# Patient Record
Sex: Male | Born: 1935
Health system: Southern US, Community
[De-identification: ages and names within clinical notes are randomized; demographics above are authoritative.]

## PROBLEM LIST (undated history)

## (undated) DIAGNOSIS — I38 Endocarditis, valve unspecified: Secondary | ICD-10-CM

## (undated) DIAGNOSIS — I509 Heart failure, unspecified: Secondary | ICD-10-CM

## (undated) DIAGNOSIS — I4891 Unspecified atrial fibrillation: Secondary | ICD-10-CM

## (undated) DIAGNOSIS — I10 Essential (primary) hypertension: Secondary | ICD-10-CM

## (undated) DIAGNOSIS — S12600A Unspecified displaced fracture of seventh cervical vertebra, initial encounter for closed fracture: Secondary | ICD-10-CM

## (undated) DIAGNOSIS — I351 Nonrheumatic aortic (valve) insufficiency: Secondary | ICD-10-CM

## (undated) HISTORY — PX: NECK SURGERY: SHX720

## (undated) HISTORY — DX: Essential (primary) hypertension: I10

---

## 1958-04-29 HISTORY — PX: APPENDECTOMY: SHX54

## 2002-04-29 HISTORY — PX: SPINE SURGERY: SHX786

## 2002-06-02 ENCOUNTER — Inpatient Hospital Stay (HOSPITAL_COMMUNITY): Admission: AD | Admit: 2002-06-02 | Discharge: 2002-06-07 | Payer: Self-pay | Admitting: General Surgery

## 2002-06-02 ENCOUNTER — Encounter: Payer: Self-pay | Admitting: Emergency Medicine

## 2002-06-03 ENCOUNTER — Encounter: Payer: Self-pay | Admitting: Neurosurgery

## 2002-06-04 ENCOUNTER — Encounter: Payer: Self-pay | Admitting: Neurosurgery

## 2002-06-07 ENCOUNTER — Encounter: Payer: Self-pay | Admitting: Neurosurgery

## 2006-06-30 ENCOUNTER — Ambulatory Visit (HOSPITAL_COMMUNITY): Admission: RE | Admit: 2006-06-30 | Discharge: 2006-06-30 | Payer: Self-pay | Admitting: Ophthalmology

## 2006-07-31 ENCOUNTER — Ambulatory Visit (HOSPITAL_COMMUNITY): Admission: RE | Admit: 2006-07-31 | Discharge: 2006-07-31 | Payer: Self-pay | Admitting: Ophthalmology

## 2009-07-12 ENCOUNTER — Ambulatory Visit: Payer: Self-pay | Admitting: Family Medicine

## 2009-07-12 DIAGNOSIS — I10 Essential (primary) hypertension: Secondary | ICD-10-CM | POA: Insufficient documentation

## 2009-07-12 HISTORY — DX: Essential (primary) hypertension: I10

## 2009-07-14 LAB — CONVERTED CEMR LAB
BUN: 14 mg/dL (ref 6–23)
CO2: 31 meq/L (ref 19–32)
Calcium: 9.4 mg/dL (ref 8.4–10.5)
Chloride: 107 meq/L (ref 96–112)
Creatinine, Ser: 0.9 mg/dL (ref 0.4–1.5)
GFR calc non Af Amer: 86.11 mL/min (ref >60)
Glucose, Bld: 88 mg/dL (ref 70–99)
Potassium: 3.3 meq/L — ABNORMAL LOW (ref 3.5–5.1)
Sodium: 141 meq/L (ref 135–145)

## 2010-05-29 NOTE — Assessment & Plan Note (Signed)
Summary: TO EST//CCM   Vital Signs:  Patient profile:   77 year old male Height:      67.75 inches Weight:      190 pounds BMI:     29.21 Temp:     98.6 degrees F oral Pulse rate:   70 / minute Pulse rhythm:   regular Resp:     12 per minute BP sitting:   122 / 50  (left arm) Cuff size:   regular  Vitals Entered By: Sid Falcon LPN (July 12, 2009 8:44 AM)  Nutrition Counseling: Patient's BMI is greater than 25 and therefore counseled on weight management options. CC: New pt to establish, from [place]   History of Present Illness: New patient visit.  Patient has history of hypertension. He takes 2 medications and we need have not confirmed yet as he hasnot brought those with him today. History of appendectomy many years ago and cervical neck fracture requiring surgery 2004. No other chronic medical problems.  Family history significant for father having hypertension. One brother with colon cancer. Another with lung cancer.  Patient is married. Nonsmoker. No alcohol use. Stays quite active with gardening and other physical activities. No prior history of screening colonoscopy and at this point patient refuses. Old records pending.  Preventive Screening-Counseling & Management  Caffeine-Diet-Exercise     Does Patient Exercise: yes  Past History:  Family History: Last updated: 07/12/2009 Family History Hypertension- father Colon Ca  brother Lung Ca  brother  Social History: Last updated: 07/12/2009 Retired Married Alcohol use-no past smoker, quit 40 years ago Regular exercise-yes  Risk Factors: Exercise: yes (07/12/2009)  Past Medical History: Hypertension  Past Surgical History: Appendectomy  1960 Cervical fracture 2004  Family History: Family History Hypertension- father Colon Ca  brother Lung Ca  brother  Social History: Retired Married Alcohol use-no past smoker, quit 40 years ago Regular exercise-yes Does Patient Exercise:   yes  Review of Systems  The patient denies anorexia, fever, weight loss, weight gain, vision loss, chest pain, syncope, dyspnea on exertion, peripheral edema, prolonged cough, headaches, hemoptysis, abdominal pain, melena, hematochezia, and severe indigestion/heartburn.    Physical Exam  General:  Well-developed,well-nourished,in no acute distress; alert,appropriate and cooperative throughout examination Ears:  External ear exam shows no significant lesions or deformities.  Otoscopic examination reveals clear canals, tympanic membranes are intact bilaterally without bulging, retraction, inflammation or discharge. Hearing is grossly normal bilaterally. Mouth:  Oral mucosa and oropharynx without lesions or exudates.  Teeth in good repair. Neck:  No deformities, masses, or tenderness noted. Lungs:  patient has some crackles in the left base which may be chronic. No wheezes. No retractions. Heart:  normal rate, regular rhythm, and no gallop.   Extremities:  no pitting edema noted Neurologic:  alert & oriented X3, cranial nerves II-XII intact, and strength normal in all extremities.   Psych:  normally interactive, good eye contact, not anxious appearing, and not depressed appearing.     Impression & Recommendations:  Problem # 1:  HYPERTENSION (ICD-401.9)  His updated medication list for this problem includes:    Hydrochlorothiazide 12.5 Mg Caps (Hydrochlorothiazide) ..... One by mouth once daily    Amlodipine Besylate 10 Mg Tabs (Amlodipine besylate) ..... One by mouth once daily  Orders: TLB-BMP (Basic Metabolic Panel-BMET) (80048-METABOL) Venipuncture ([address])  Complete Medication List: 1)  Hydrochlorothiazide 12.5 Mg Caps (Hydrochlorothiazide) .... One by mouth once daily 2)  Amlodipine Besylate 10 Mg Tabs (Amlodipine besylate) .... One by mouth once daily  Patient  Instructions: 1)  Please schedule a follow-up appointment in 6 months .  2)  It is important that you exercise  reguarly at least 20 minutes 5 times a week. If you develop chest pain, have severe difficulty breathing, or feel very tired, stop exercising immediately and seek medical attention.  Prescriptions: AMLODIPINE BESYLATE 10 MG TABS (AMLODIPINE BESYLATE) one by mouth once daily  #90 x 3   Entered and Authorized by:   Evelena Peat MD   Signed by:   Evelena Peat MD on 07/12/2009   Method used:   Electronically to        [facility] (retail)       [address]       [place], [place]  [address]       Ph: [phone]       Fax: [phone]   RxID:   [id number] HYDROCHLOROTHIAZIDE 12.5 MG CAPS (HYDROCHLOROTHIAZIDE) one by mouth once daily  #90 x 3   Entered and Authorized by:   Evelena Peat MD   Signed by:   Evelena Peat MD on 07/12/2009   Method used:   Electronically to        [facility] (retail)       [address]       [place], [place]  [address]       Ph: [phone]       Fax: [phone]   RxID:   [id number]

## 2010-07-23 ENCOUNTER — Other Ambulatory Visit: Payer: Self-pay | Admitting: Family Medicine

## 2010-09-11 ENCOUNTER — Encounter: Payer: Self-pay | Admitting: Family Medicine

## 2010-09-12 ENCOUNTER — Encounter: Payer: Self-pay | Admitting: Family Medicine

## 2010-09-12 ENCOUNTER — Ambulatory Visit (INDEPENDENT_AMBULATORY_CARE_PROVIDER_SITE_OTHER): Payer: Medicare Other | Admitting: Family Medicine

## 2010-09-12 VITALS — BP 148/60 | Temp 98.3°F | Ht 68.5 in | Wt 197.0 lb

## 2010-09-12 DIAGNOSIS — I10 Essential (primary) hypertension: Secondary | ICD-10-CM

## 2010-09-12 LAB — BASIC METABOLIC PANEL WITH GFR
BUN: 14 mg/dL (ref 6–23)
CO2: 27 meq/L (ref 19–32)
Calcium: 9.4 mg/dL (ref 8.4–10.5)
Chloride: 106 meq/L (ref 96–112)
Creatinine, Ser: 0.9 mg/dL (ref 0.4–1.5)
GFR: 90.49 mL/min
Glucose, Bld: 77 mg/dL (ref 70–99)
Potassium: 3.1 meq/L — ABNORMAL LOW (ref 3.5–5.1)
Sodium: 141 meq/L (ref 135–145)

## 2010-09-12 MED ORDER — HYDROCHLOROTHIAZIDE 12.5 MG PO CAPS: 12.5000 mg | ORAL_CAPSULE | ORAL | Status: DC

## 2010-09-12 MED ORDER — AMLODIPINE BESYLATE 10 MG PO TABS: 10.0000 mg | ORAL_TABLET | Freq: Every day | ORAL | Status: DC

## 2010-09-12 NOTE — Progress Notes (Signed)
  Subjective:    Patient ID: Brett Blankenship, male    DOB: Oct 08, 1926, 77 y.o.   MRN: [id number]  HPI Followup hypertension. Patient takes Norvasc 10 mg daily and hydrochlorothiazide 12.5 mg daily. No dizziness. Patient denies any chest pains or shortness of breath. Trace edema legs not changed. Stays quite active with gardening. No complaints. Nonsmoker. Compliant with medications  Past Medical History  Diagnosis Date  . HYPERTENSION 07/12/2009   Past Surgical History  Procedure Date  . Appendectomy 1960  . Spine surgery 2004    fracture cervical spine    reports that he quit smoking about 47 years ago. His smoking use included Cigarettes. He has a 1.5 pack-year smoking history. He does not have any smokeless tobacco history on file. His alcohol and drug histories not on file. family history includes Cancer in his brother and Hypertension in his father. No Known Allergies    Review of Systems  Constitutional: Negative for fatigue.  Eyes: Negative for visual disturbance.  Respiratory: Negative for cough, chest tightness and shortness of breath.   Cardiovascular: Negative for chest pain, palpitations and leg swelling.  Neurological: Negative for dizziness, syncope, weakness, light-headedness and headaches.       Objective:   Physical Exam  Constitutional: He appears well-developed and well-nourished.  HENT:  Right Ear: External ear normal.  Left Ear: External ear normal.  Mouth/Throat: Oropharynx is clear and moist. No oropharyngeal exudate.  Neck: No thyromegaly present.  Cardiovascular: Normal rate, regular rhythm and normal heart sounds.   Pulmonary/Chest: Breath sounds normal. No respiratory distress. He has no wheezes. He has no rales.  Musculoskeletal: He exhibits no edema.  Lymphadenopathy:    He has no cervical adenopathy.  Neurological: He is alert.          Assessment & Plan:  Hypertension stable. Refilled medications for one year. Check basic metabolic  panel (on HCTZ). Office followup 6 months

## 2010-09-13 NOTE — Progress Notes (Signed)
Quick Note:  Sent labs and instructions highlighted along with list of high potassium rich foods to pt home. ______

## 2010-09-13 NOTE — Progress Notes (Signed)
Quick Note:  Pt wife informed also ______

## 2010-09-14 NOTE — Discharge Summary (Signed)
NAME:  Brett Blankenship, Brett Blankenship NO.:  [id number]   MEDICAL RECORD NO.:  [id number]                   PATIENT TYPE:  INP   LOCATION:  3104                                 FACILITY:  [facility]   PHYSICIAN:  Jimmye Norman, M.D.                   DATE OF BIRTH:  09/23/1926   DATE OF ADMISSION:  06/02/2002  DATE OF DISCHARGE:  06/07/2002                                 DISCHARGE SUMMARY   ADMITTING TRAUMA SURGEON:  Jimmye Norman, M.D.   CONSULTATIONS:  Coletta Memos, M.D.,  Neurosurgery.   DISCHARGE DIAGNOSES:  1. Status post fall.  2. Type 2 odontoid fracture.  3. Right medial thigh contusion.  4. Right periorbital contusion.   PROCEDURE:  Transodontoid screw placement per Dr. Franky Macho June 04, 2002.   HISTORY OF PRESENT ILLNESS:  This is a 77 year old gentleman who was cutting  down some tree limbs when he fell off a downed tree which knocked him over  into a pile of branches.  The limb actually hit him in the left lower  extremity, flipping him over off his feet and onto the ground.  He  immediately had neck pain but no loss of consciousness.  He was taken to  [facility] for assessment and a CT scan at that time showed an  odontoid fracture at the base of the dens.  The patient was transferred to  [facility] for definitive care.   HOSPITAL COURSE:  On evaluation at [facility] again he was  noted to have odontoid fracture at the base of the dens on CT scan. He was  hemodynamically stable.  He did have some bruising about his scalp and right  periorbital area.   The patient was seen in consultation by Dr. Franky Macho and he continued to be  maintained in a cervical collar.  He remained neurologically intact  throughout his hospitalization.  The patient did have follow up lateral  cervical spine films in the seated position which showed some instability of  his type 2 odontoid fracture.  An MRI scan showed that the  transverse  ligament was intact.  Secondary to the instability of the patient's fracture  it was recommended that he undergo transodontoid screw placements and the  patient agreed to this procedure and underwent this procedure on June 04, 2002 per Dr. Franky Macho.  Postoperatively he has had a benign course.  He has  been mobilized without difficulty and continues to wear a [place] J collar.   The patient had some mild hypokalemia which was corrected.  It was felt the  patient would be ready for discharge either today or tomorrow.    DISCHARGE MEDICATIONS:  1. Vicodin one to two p.o. q.4-6h. PRN pain, #50, no refills.  2. Flexeril 10 mg one p.o. t.i.d. PRN muscle spasms, #40.   FOLLOW UP:  Patient will follow up with Dr.  Cabbell in several weeks and  with trauma service as needed.     Shawn Rayburn, P.A.                       Jimmye Norman, M.D.    SR/MEDQ  D:  06/07/2002  T:  06/07/2002  Job:  [id number]   cc:   Coletta Memos, M.D.  [address]  [place]  [place] [address]  Fax: [phone]

## 2010-09-14 NOTE — Consult Note (Signed)
NAME:  AMAURIS, DEBOIS                       ACCOUNT NO.:  [id number]   MEDICAL RECORD NO.:  [id number]                   PATIENT TYPE:  INP   LOCATION:  3104                                 FACILITY:  [facility]   PHYSICIAN:  Coletta Memos, M.D.                  DATE OF BIRTH:  07-Apr-1927   DATE OF CONSULTATION:  06/03/2002  DATE OF DISCHARGE:                                   CONSULTATION   REASON FOR CONSULTATION:  Type II C2 fracture.   INDICATIONS:  Bentley Fissel is a 77 year old who fell off of a downed tree  onto the ground after a branch knocked him over on June 02, 2002.  He was  taken to [facility] where x-rays were performed showing a type II  fracture.   Mr. Vandermeulen is otherwise healthy and while cutting a limb on a downed tree,  it caused him to fly into the air and landed on the right side of his face.  He did not lose consciousness but when he complained of pain he was then  taken to the hospital.   PAST MEDICAL HISTORY:  Excellent.   PAST SURGICAL HISTORY:  1. Inguinal herniorrhaphy.  2. Appendectomy.   SOCIAL HISTORY:  He does not smoke nor does he drink.  He is currently  retired.   ALLERGIES:  No known drug allergies.   CURRENT MEDICATIONS:  He currently takes no medications.   PHYSICAL EXAMINATION:  GENERAL:  He is alert and oriented x4 and answering  all questions appropriately.  Memory __________, attention span and  __________ knowledge are normal.  He is well kempt and in no acute distress.  NEUROLOGIC:  He has 5/5 strength in the upper and lower extremities.  Speech  is slurred but easily understandable.  He is in a cervical collar with dried  blood about his right face and multiple abrasions and areas of bruising on  the right side.  LUNGS:  Fields are clear.  HEART:  Regular rhythm and rate.  No murmurs or rubs.  EXTREMITIES:  Pulses good at the wrists and feet bilaterally.   IMPRESSION:  Mr. Utsey is admitted by the Trauma  Service.  I will order  upright and supine lateral and AP cervical spine films to assess stability  in the cervical collar.  I will check those films later today and make a  final  judgment on whether or not I think any surgery is indicated.  He is  otherwise doing quite well and has no neurosurgical problems.  I was not  able to find the CT exam which had been performed at the [facility]  but by exam clearly does not have a neurologic deficit there with regards to  his motor strength.  Coletta Memos, M.D.    KC/MEDQ  D:  06/04/2002  T:  06/05/2002  Job:  [id number]

## 2010-09-14 NOTE — Op Note (Signed)
NAME:  Brett Blankenship, Brett Blankenship                       ACCOUNT NO.:  [id number]   MEDICAL RECORD NO.:  [id number]                   PATIENT TYPE:  INP   LOCATION:  3104                                 FACILITY:  [facility]   PHYSICIAN:  Coletta Memos, M.D.                  DATE OF BIRTH:  July 29, 1926   DATE OF PROCEDURE:  06/04/2002  DATE OF DISCHARGE:                                 OPERATIVE REPORT   PREOPERATIVE DIAGNOSES:  Type 2 C2 fracture.   POSTOPERATIVE DIAGNOSES:  Type 2 C2 fracture.   PROCEDURE:  Transodontoid screw placement with fluoroscopic guidance.   COMPLICATIONS:  None.   ANESTHESIA:  General endotracheal.   SURGEON:  Coletta Memos, M.D.   ASSISTANT:  Cristi Loron, M.D.   INDICATIONS:  The patient is a 77 year old who suffered a fall on June 02, 2002.  This resulted in a type 2 odontoid fracture.  Neurologically he  has had no problems.  MRI done preoperatively showed that the transverse  ligament was intact.  I recommended, and he has agreed, to undergo a  transodontoid screw placement for stabilization of the fracture.   OPERATIVE NOTE:  The patient was brought to the operating room, intubated  and placed under general anesthesia.  A Foley catheter was placed under  sterile conditions.  His neck was positioned so that the fracture was  reduced.  This was seen in both AP and lateral fluoroscopy, which was used  simultaneously.  His neck was then prepped and he was draped in a sterile  fashion.  I infiltrated 3 cc of 0.5% lidocaine, 1:200,000 strength  epinephrine.  I then made an incision starting at the midline and extended  to the medial border of the right sternocleidomastoid.  I then removed soft  tissue and exposed the anterior cervical spine.  Then, using fluoroscopy, I  saw that I was at the C2-3 disk space.  I placed the Medtronic screw guide  there, and then using an awl made a hole into C2.  I then placed an inner  sleeve with Dr. Lovell Sheehan assistance.   We placed a threaded wire into C2 and  our proposed trajectory using fluoroscopic guidance.  A drill was then used  to drill over the wire into C2.  The bone was then packed.  A 42 mm lag  screw was then placed without difficulty.  Fluoroscopy was used during all  phases to ensure proper placement.  The AP and lateral views looked good.  I  then irrigated the wound.  I then closed the wound in layered fashion, using  Vicryl suture reapproximating the platysma subcutaneous tissues.  Dermabond  used for a sterile dressing.  The patient was placed back into a cervical  collar, moving all extremities postoperatively.  Coletta Memos, M.D.    KC/MEDQ  D:  06/04/2002  T:  06/05/2002  Job:  [id number]

## 2010-09-14 NOTE — H&P (Signed)
NAME:  Brett Blankenship, Brett Blankenship                       ACCOUNT NO.:  [id number]   MEDICAL RECORD NO.:  [id number]                   PATIENT TYPE:  INP   LOCATION:  3107                                 FACILITY:  [facility]   PHYSICIAN:  Jimmye Norman III, M.D.               DATE OF BIRTH:  Sep 07, 1926   DATE OF ADMISSION:  06/02/2002  DATE OF DISCHARGE:                                HISTORY & PHYSICAL   IDENTIFICATION AND CHIEF COMPLAINT:  The patient is a 77 year old who fell  off of a downed tree onto the ground after a branch knocked him over,  resulting in a type 2 C2 fracture.   HISTORY OF PRESENT ILLNESS:  The patient is otherwise healthy, was cutting  on a limb on a downed tree when it broke off, causing it to flip the patient  off of his feet, onto the ground, into some bushes, onto his right shoulder  and neck.  He immediately had neck pain but had no LOC, continued to  complain of neck pain until when he was admitted to [name],  where CT showed an odontoid fracture at the base of the dens.  He was  transferred to [facility] for definitive therapy and care.  He got a tetanus  shot at [facility].   PAST MEDICAL HISTORY:  His past history is unremarkable.   PAST SURGICAL HISTORY:  His past surgical history is significant for an  appendectomy at age 60 and a right inguinal hernia when he was 77 years old.   SOCIAL HISTORY:  He is a nonsmoker, does not drink.  Occupation:  He is  retired from [employer] account [employer].   MEDICATIONS:  He takes no medications.   ALLERGIES:  He has no known drug allergies.   REVIEW OF SYSTEMS:  Besides the neck pain which he rates to be 4-8/10, he is  otherwise complaining of some bruising around his eye.   PHYSICAL EXAMINATION:  VITAL SIGNS:  Pulse is 70, blood pressure 158/67,  respirations 14, temperature 99.2.  GENERAL:  He has good color and warm.  HEENT:  He is normocephalic.  He does have facial trauma around the right  eye  with right mild periorbital ecchymoses and edema.  His TMs are okay.  He  has no external auricle trauma, no oral trauma.  NECK:  His neck is tender and he has pain.  There is no swelling or  crepitance.  His trachea is midline.  CHEST:  No chest wall crepitance.  No tenderness.  No wounds.  He has got  clear breath sounds.  CARDIAC:  Regular rate and rhythm with no murmurs, gallops, lifts or heaves.  ABDOMEN:  His abdomen is soft and nontender.  BACK:  He has no back stepoffs and no deformities.  NEUROLOGIC/EXTREMITIES:  Cranial nerve II-XII are grossly intact.  He is  oriented x3 and pulses and sensation are  normal.  He does have only 1+  posterior tibial pulses bilaterally and no palpable dorsalis pedis with sort  of a fifth digit greater than fourth digit abnormality of the foot; this is  bilateral and congenital.  MUSCULOSKELETAL:  He has got a type 2 fracture of the odontoid process.   PLAN:  Plan is to keep him in a [place], currently in a [place] J, which  appears to be fitting him well.  Dr. Coletta Memos, the neurosurgeon, will  see him in the morning.  Unlikely that the patient will need surgery but we  will control his pain currently, allow him a regular diet.                                               Kathrin Ruddy, M.D.    JW/MEDQ  D:  06/02/2002  T:  06/03/2002  Job:  [id number]   cc:   Coletta Memos, M.D.  [address]  [place]  [place] [address]  Fax: [phone]

## 2010-10-20 ENCOUNTER — Other Ambulatory Visit: Payer: Self-pay | Admitting: Family Medicine

## 2010-10-22 ENCOUNTER — Other Ambulatory Visit: Payer: Self-pay | Admitting: Family Medicine

## 2010-10-22 MED ORDER — AMLODIPINE BESYLATE 10 MG PO TABS: 10.0000 mg | ORAL_TABLET | Freq: Every day | ORAL | Status: DC

## 2010-10-22 MED ORDER — HYDROCHLOROTHIAZIDE 12.5 MG PO CAPS: 12.5000 mg | ORAL_CAPSULE | ORAL | Status: DC

## 2010-10-22 NOTE — Telephone Encounter (Signed)
Pt informed Rx filled

## 2010-10-22 NOTE — Telephone Encounter (Signed)
Refill HCTZ 12.5mg  #90 and Amlodiopine 10mg  #90 to [facility]. He is almost outt.

## 2010-11-20 ENCOUNTER — Encounter (INDEPENDENT_AMBULATORY_CARE_PROVIDER_SITE_OTHER): Payer: Medicare Other | Admitting: Ophthalmology

## 2010-11-20 DIAGNOSIS — H353290 Exudative age-related macular degeneration, unspecified eye, stage unspecified: Secondary | ICD-10-CM

## 2010-11-20 DIAGNOSIS — H43819 Vitreous degeneration, unspecified eye: Secondary | ICD-10-CM

## 2010-11-20 DIAGNOSIS — H3530 Unspecified macular degeneration: Secondary | ICD-10-CM

## 2011-02-20 ENCOUNTER — Encounter (INDEPENDENT_AMBULATORY_CARE_PROVIDER_SITE_OTHER): Payer: Medicare Other | Admitting: Ophthalmology

## 2011-02-20 DIAGNOSIS — H3530 Unspecified macular degeneration: Secondary | ICD-10-CM

## 2011-02-20 DIAGNOSIS — H33309 Unspecified retinal break, unspecified eye: Secondary | ICD-10-CM

## 2011-02-20 DIAGNOSIS — H35359 Cystoid macular degeneration, unspecified eye: Secondary | ICD-10-CM

## 2011-02-20 DIAGNOSIS — H35379 Puckering of macula, unspecified eye: Secondary | ICD-10-CM

## 2011-03-14 ENCOUNTER — Encounter: Payer: Self-pay | Admitting: Family Medicine

## 2011-03-18 ENCOUNTER — Ambulatory Visit: Payer: Medicare Other | Admitting: Family Medicine

## 2011-03-20 ENCOUNTER — Encounter: Payer: Self-pay | Admitting: Family Medicine

## 2011-03-20 ENCOUNTER — Ambulatory Visit (INDEPENDENT_AMBULATORY_CARE_PROVIDER_SITE_OTHER): Payer: Medicare Other | Admitting: Family Medicine

## 2011-03-20 VITALS — BP 130/64 | HR 60 | Temp 98.3°F | Resp 12 | Ht 68.5 in | Wt 197.0 lb

## 2011-03-20 DIAGNOSIS — I10 Essential (primary) hypertension: Secondary | ICD-10-CM

## 2011-03-20 DIAGNOSIS — E876 Hypokalemia: Secondary | ICD-10-CM

## 2011-03-20 LAB — BASIC METABOLIC PANEL
BUN: 18 mg/dL (ref 6–23)
GFR: 91.6 mL/min (ref >60.00)
Potassium: 3.3 mEq/L — ABNORMAL LOW (ref 3.5–5.1)
Sodium: 140 mEq/L (ref 135–145)

## 2011-03-20 NOTE — Progress Notes (Signed)
  Subjective:    Patient ID: Brett Blankenship, male    DOB: 10-09-1926, 77 y.o.   MRN: [id number]  HPI  Followup hypertension. Patient takes amlodipine and HCTZ. Not monitoring blood pressure. No headaches, dizziness, chest pains, or dyspnea. No peripheral edema issues. Patient has history of hypokalemia. Last potassium 3.1. Does eat lots of fruits and vegetables.   Review of Systems  Constitutional: Negative for fatigue.  Eyes: Negative for visual disturbance.  Respiratory: Negative for cough, chest tightness and shortness of breath.   Cardiovascular: Negative for chest pain, palpitations and leg swelling.  Neurological: Negative for dizziness, syncope, weakness, light-headedness and headaches.       Objective:   Physical Exam  Constitutional: He appears well-developed and well-nourished.  Neck: Neck supple. No thyromegaly present.  Cardiovascular: Normal rate, regular rhythm and normal heart sounds.   Pulmonary/Chest: Effort normal and breath sounds normal. No respiratory distress. He has no wheezes. He has no rales.  Musculoskeletal: He exhibits no edema.  Lymphadenopathy:    He has no cervical adenopathy.          Assessment & Plan:  #1 hypertension. Stable. Continue current medications. Check basic metabolic panel with history of mild hypokalemia

## 2011-03-20 NOTE — Patient Instructions (Signed)
Foods Rich in Potassium Food / Potassium (mg)  Apricots, dried,  cup / 378 mg   Apricots, raw, 1 cup halves / 401 mg   Avocado,  / 487 mg   Banana, 1 large / 487 mg   Beef, lean, round, 3 oz / 202 mg   Cantaloupe, 1 cup cubes / 427 mg   Dates, medjool, 5 whole / 835 mg   Ham, cured, 3 oz / 212 mg   Lentils, dried,  cup / 458 mg   Lima beans, frozen,  cup / 258 mg   Orange, 1 large / 333 mg   Orange juice, 1 cup / 443 mg   Peaches, dried,  cup / 398 mg   Peas, split, cooked,  cup / 355 mg   Potato, boiled, 1 medium / 515 mg   Prunes, dried, uncooked,  cup / 318 mg   Raisins,  cup / 309 mg   Salmon, pink, raw, 3 oz / 275 mg   Sardines, canned , 3 oz / 338 mg   Tomato, raw, 1 medium / 292 mg   Tomato juice, 6 oz / 417 mg   Turkey, 3 oz / 349 mg  Document Released: 04/15/2005 Document Revised: 12/26/2010 Document Reviewed: 08/29/2008 [employer] Patient Information 2012 [employer]. 

## 2011-03-22 ENCOUNTER — Telehealth: Payer: Self-pay | Admitting: Family Medicine

## 2011-03-22 MED ORDER — POTASSIUM CHLORIDE CRYS ER 20 MEQ PO TBCR: 20.0000 meq | EXTENDED_RELEASE_TABLET | Freq: Every day | ORAL | Status: DC

## 2011-03-22 NOTE — Progress Notes (Signed)
Quick Note:  Left a message for pt to return call. ______ 

## 2011-03-22 NOTE — Progress Notes (Signed)
Quick Note:  Pt aware; medication has been sent to pharmacy. ______

## 2011-03-22 NOTE — Progress Notes (Signed)
Addended by: Azucena Freed on: 03/22/2011 12:21 PM   Modules accepted: Orders

## 2011-03-22 NOTE — Telephone Encounter (Signed)
Pt aware of lab results 

## 2011-03-22 NOTE — Progress Notes (Signed)
Addended by: Azucena Freed on: 03/22/2011 03:02 PM   Modules accepted: Orders

## 2011-03-22 NOTE — Telephone Encounter (Signed)
Pt called and said that he is returning call back from nurse and is req a call back asap today.

## 2011-03-23 ENCOUNTER — Other Ambulatory Visit: Payer: Self-pay | Admitting: Family Medicine

## 2011-06-21 ENCOUNTER — Other Ambulatory Visit: Payer: Medicare Other

## 2011-06-21 ENCOUNTER — Other Ambulatory Visit (INDEPENDENT_AMBULATORY_CARE_PROVIDER_SITE_OTHER): Payer: Medicare Other

## 2011-06-21 DIAGNOSIS — E876 Hypokalemia: Secondary | ICD-10-CM

## 2011-06-21 LAB — BASIC METABOLIC PANEL
CO2: 28 mEq/L (ref 19–32)
Calcium: 9.2 mg/dL (ref 8.4–10.5)
Chloride: 103 mEq/L (ref 96–112)
Sodium: 137 mEq/L (ref 135–145)

## 2011-06-25 ENCOUNTER — Other Ambulatory Visit: Payer: Self-pay | Admitting: Family Medicine

## 2011-06-25 NOTE — Progress Notes (Signed)
Quick Note:  Pt informed. Copy of labs and potassium rich foods mailed to home, he will purchase K supplement today again as he ran out ______

## 2011-07-22 ENCOUNTER — Other Ambulatory Visit: Payer: Self-pay | Admitting: Family Medicine

## 2011-07-23 ENCOUNTER — Other Ambulatory Visit: Payer: Self-pay | Admitting: Family Medicine

## 2011-09-18 ENCOUNTER — Ambulatory Visit: Payer: Medicare Other | Admitting: Family Medicine

## 2011-09-18 ENCOUNTER — Ambulatory Visit (INDEPENDENT_AMBULATORY_CARE_PROVIDER_SITE_OTHER): Payer: Medicare Other | Admitting: Family Medicine

## 2011-09-18 ENCOUNTER — Encounter: Payer: Self-pay | Admitting: Family Medicine

## 2011-09-18 VITALS — BP 124/68 | Temp 98.0°F | Wt 204.0 lb

## 2011-09-18 DIAGNOSIS — I10 Essential (primary) hypertension: Secondary | ICD-10-CM

## 2011-09-18 NOTE — Patient Instructions (Signed)
Foods Rich in Potassium Food / Potassium (mg)  Apricots, dried,  cup / 378 mg   Apricots, raw, 1 cup halves / 401 mg   Avocado,  / 487 mg   Banana, 1 large / 487 mg   Beef, lean, round, 3 oz / 202 mg   Cantaloupe, 1 cup cubes / 427 mg   Dates, medjool, 5 whole / 835 mg   Ham, cured, 3 oz / 212 mg   Lentils, dried,  cup / 458 mg   Lima beans, frozen,  cup / 258 mg   Orange, 1 large / 333 mg   Orange juice, 1 cup / 443 mg   Peaches, dried,  cup / 398 mg   Peas, split, cooked,  cup / 355 mg   Potato, boiled, 1 medium / 515 mg   Prunes, dried, uncooked,  cup / 318 mg   Raisins,  cup / 309 mg   Salmon, pink, raw, 3 oz / 275 mg   Sardines, canned , 3 oz / 338 mg   Tomato, raw, 1 medium / 292 mg   Tomato juice, 6 oz / 417 mg   Turkey, 3 oz / 349 mg  Document Released: 04/15/2005 Document Revised: 12/26/2010 Document Reviewed: 08/29/2008 [employer] Patient Information 2012 [employer]. 

## 2011-09-18 NOTE — Progress Notes (Signed)
  Subjective:    Patient ID: Brett Blankenship, male    DOB: Sep 02, 1926, 78 y.o.   MRN: [id number]  HPI  Followup hypertension. Patient takes amlodipine and HCTZ. History of mild hypokalemia. We started potassium replacement several months ago. He wishes to get off this. Does eat fairly high potassium diet and handout given today. Denies any chest pains. No dizziness. No orthostasis. Compliant with medications. Denies any side effects. Stays active with gardening.  Past Medical History  Diagnosis Date  . HYPERTENSION 07/12/2009   Past Surgical History  Procedure Date  . Appendectomy 1960  . Spine surgery 2004    fracture cervical spine    reports that he quit smoking about 48 years ago. His smoking use included Cigarettes. He has a 1.5 pack-year smoking history. He does not have any smokeless tobacco history on file. His alcohol and drug histories not on file. family history includes Cancer in his brother and Hypertension in his father. No Known Allergies    Review of Systems  Constitutional: Negative for fatigue.  Eyes: Negative for visual disturbance.  Respiratory: Negative for cough, chest tightness and shortness of breath.   Cardiovascular: Negative for chest pain, palpitations and leg swelling.  Neurological: Negative for dizziness, syncope, weakness, light-headedness and headaches.       Objective:   Physical Exam  Constitutional: He is oriented to person, place, and time. He appears well-developed and well-nourished.  Cardiovascular: Normal rate and regular rhythm.   Pulmonary/Chest: Effort normal and breath sounds normal. No respiratory distress. He has no wheezes. He has no rales.  Musculoskeletal:       Only very minimal trace edema lower legs  Neurological: He is alert and oriented to person, place, and time.          Assessment & Plan:  Hypertension. Stable and at goal. Continue current medications. High potassium diet. Recheck basic metabolic panel at  followup in 6 months.

## 2011-10-24 ENCOUNTER — Other Ambulatory Visit: Payer: Self-pay | Admitting: Family Medicine

## 2011-11-25 ENCOUNTER — Encounter (INDEPENDENT_AMBULATORY_CARE_PROVIDER_SITE_OTHER): Payer: Medicare Other | Admitting: Ophthalmology

## 2011-11-25 DIAGNOSIS — H35359 Cystoid macular degeneration, unspecified eye: Secondary | ICD-10-CM

## 2011-11-25 DIAGNOSIS — H33309 Unspecified retinal break, unspecified eye: Secondary | ICD-10-CM

## 2011-11-25 DIAGNOSIS — H43819 Vitreous degeneration, unspecified eye: Secondary | ICD-10-CM

## 2011-11-25 DIAGNOSIS — H35039 Hypertensive retinopathy, unspecified eye: Secondary | ICD-10-CM

## 2011-11-25 DIAGNOSIS — I10 Essential (primary) hypertension: Secondary | ICD-10-CM

## 2011-11-25 DIAGNOSIS — H3530 Unspecified macular degeneration: Secondary | ICD-10-CM

## 2011-12-23 ENCOUNTER — Encounter (INDEPENDENT_AMBULATORY_CARE_PROVIDER_SITE_OTHER): Payer: Medicare Other | Admitting: Ophthalmology

## 2011-12-23 DIAGNOSIS — H33309 Unspecified retinal break, unspecified eye: Secondary | ICD-10-CM

## 2011-12-23 DIAGNOSIS — H43819 Vitreous degeneration, unspecified eye: Secondary | ICD-10-CM

## 2011-12-23 DIAGNOSIS — H35359 Cystoid macular degeneration, unspecified eye: Secondary | ICD-10-CM

## 2011-12-23 DIAGNOSIS — I10 Essential (primary) hypertension: Secondary | ICD-10-CM

## 2011-12-23 DIAGNOSIS — H3530 Unspecified macular degeneration: Secondary | ICD-10-CM

## 2011-12-23 DIAGNOSIS — H35039 Hypertensive retinopathy, unspecified eye: Secondary | ICD-10-CM

## 2012-01-20 ENCOUNTER — Encounter (INDEPENDENT_AMBULATORY_CARE_PROVIDER_SITE_OTHER): Payer: Medicare Other | Admitting: Ophthalmology

## 2012-01-20 DIAGNOSIS — H35039 Hypertensive retinopathy, unspecified eye: Secondary | ICD-10-CM

## 2012-01-20 DIAGNOSIS — H43819 Vitreous degeneration, unspecified eye: Secondary | ICD-10-CM

## 2012-01-20 DIAGNOSIS — H33309 Unspecified retinal break, unspecified eye: Secondary | ICD-10-CM

## 2012-01-20 DIAGNOSIS — H3530 Unspecified macular degeneration: Secondary | ICD-10-CM

## 2012-01-20 DIAGNOSIS — I10 Essential (primary) hypertension: Secondary | ICD-10-CM

## 2012-01-20 DIAGNOSIS — H35359 Cystoid macular degeneration, unspecified eye: Secondary | ICD-10-CM

## 2012-02-13 ENCOUNTER — Encounter (INDEPENDENT_AMBULATORY_CARE_PROVIDER_SITE_OTHER): Payer: Medicare Other | Admitting: Ophthalmology

## 2012-02-13 DIAGNOSIS — H43819 Vitreous degeneration, unspecified eye: Secondary | ICD-10-CM

## 2012-02-13 DIAGNOSIS — I10 Essential (primary) hypertension: Secondary | ICD-10-CM

## 2012-02-13 DIAGNOSIS — H35039 Hypertensive retinopathy, unspecified eye: Secondary | ICD-10-CM

## 2012-02-13 DIAGNOSIS — H35359 Cystoid macular degeneration, unspecified eye: Secondary | ICD-10-CM

## 2012-02-13 DIAGNOSIS — H33309 Unspecified retinal break, unspecified eye: Secondary | ICD-10-CM

## 2012-03-12 ENCOUNTER — Encounter (INDEPENDENT_AMBULATORY_CARE_PROVIDER_SITE_OTHER): Payer: Medicare Other | Admitting: Ophthalmology

## 2012-03-12 DIAGNOSIS — H43819 Vitreous degeneration, unspecified eye: Secondary | ICD-10-CM

## 2012-03-12 DIAGNOSIS — H33309 Unspecified retinal break, unspecified eye: Secondary | ICD-10-CM

## 2012-03-12 DIAGNOSIS — H35359 Cystoid macular degeneration, unspecified eye: Secondary | ICD-10-CM

## 2012-03-12 DIAGNOSIS — H35039 Hypertensive retinopathy, unspecified eye: Secondary | ICD-10-CM

## 2012-03-12 DIAGNOSIS — I10 Essential (primary) hypertension: Secondary | ICD-10-CM

## 2012-03-20 ENCOUNTER — Ambulatory Visit: Payer: Medicare Other | Admitting: Family Medicine

## 2012-03-25 ENCOUNTER — Encounter: Payer: Self-pay | Admitting: Family Medicine

## 2012-03-25 ENCOUNTER — Ambulatory Visit (INDEPENDENT_AMBULATORY_CARE_PROVIDER_SITE_OTHER): Payer: Medicare Other | Admitting: Family Medicine

## 2012-03-25 VITALS — BP 138/50 | Temp 98.3°F | Wt 205.0 lb

## 2012-03-25 DIAGNOSIS — I10 Essential (primary) hypertension: Secondary | ICD-10-CM

## 2012-03-25 LAB — BASIC METABOLIC PANEL
Chloride: 102 mEq/L (ref 96–112)
Creatinine, Ser: 0.9 mg/dL (ref 0.4–1.5)
GFR: 91.37 mL/min (ref >60.00)

## 2012-03-25 NOTE — Patient Instructions (Signed)
Foods Rich in Potassium Food / Potassium (mg)  Apricots, dried,  cup / 378 mg   Apricots, raw, 1 cup halves / 401 mg   Avocado,  / 487 mg   Banana, 1 large / 487 mg   Beef, lean, round, 3 oz / 202 mg   Cantaloupe, 1 cup cubes / 427 mg   Dates, medjool, 5 whole / 835 mg   Ham, cured, 3 oz / 212 mg   Lentils, dried,  cup / 458 mg   Lima beans, frozen,  cup / 258 mg   Orange, 1 large / 333 mg   Orange juice, 1 cup / 443 mg   Peaches, dried,  cup / 398 mg   Peas, split, cooked,  cup / 355 mg   Potato, boiled, 1 medium / 515 mg   Prunes, dried, uncooked,  cup / 318 mg   Raisins,  cup / 309 mg   Salmon, pink, raw, 3 oz / 275 mg   Sardines, canned , 3 oz / 338 mg   Tomato, raw, 1 medium / 292 mg   Tomato juice, 6 oz / 417 mg   Turkey, 3 oz / 349 mg  Document Released: 04/15/2005 Document Revised: 12/26/2010 Document Reviewed: 08/29/2008 [employer] Patient Information 2012 [employer]. 

## 2012-03-25 NOTE — Progress Notes (Signed)
  Subjective:    Patient ID: Brett Blankenship, male    DOB: Mar 07, 1927, 78 y.o.   MRN: [id number]  HPI  Hypertension followup. Patient takes Norvasc and HCTZ. History of low potassium but patient states he was taken off with potassium supplement though we do not recall this. Generally feels well. Stays  active with several hobbies. No dizziness. No chest pains. No shortness of breath. Compliant with medications. Patient declines flu vaccine. Nonsmoker.  Past Medical History  Diagnosis Date  . HYPERTENSION 07/12/2009   Past Surgical History  Procedure Date  . Appendectomy 1960  . Spine surgery 2004    fracture cervical spine    reports that he quit smoking about 48 years ago. His smoking use included Cigarettes. He has a 1.5 pack-year smoking history. He does not have any smokeless tobacco history on file. His alcohol and drug histories not on file. family history includes Cancer in his brother and Hypertension in his father. No Known Allergies    Review of Systems  Constitutional: Negative for fatigue.  Eyes: Negative for visual disturbance.  Respiratory: Negative for cough, chest tightness and shortness of breath.   Cardiovascular: Negative for chest pain, palpitations and leg swelling.  Neurological: Negative for dizziness, syncope, weakness, light-headedness and headaches.       Objective:   Physical Exam  Constitutional: He is oriented to person, place, and time. He appears well-developed and well-nourished.  HENT:  Mouth/Throat: Oropharynx is clear and moist.  Neck: Neck supple. No thyromegaly present.  Cardiovascular: Normal rate and regular rhythm.   Pulmonary/Chest: Effort normal and breath sounds normal. No respiratory distress. He has no wheezes. He has no rales.  Musculoskeletal: He exhibits no edema.  Lymphadenopathy:    He has no cervical adenopathy.  Neurological: He is alert and oriented to person, place, and time.          Assessment & Plan:    Hypertension. Stable. Continue current medications. Check basic metabolic panel with tendency towards mild hypokalemia in the past. Flu vaccine offered and declined. Routine followup 6 months

## 2012-03-27 MED ORDER — POTASSIUM CHLORIDE CRYS ER 20 MEQ PO TBCR: 20.0000 meq | EXTENDED_RELEASE_TABLET | Freq: Every day | ORAL | Status: DC

## 2012-03-27 NOTE — Addendum Note (Signed)
Addended by: Jimmye Norman on: 03/27/2012 11:09 AM   Modules accepted: Orders

## 2012-04-09 ENCOUNTER — Encounter (INDEPENDENT_AMBULATORY_CARE_PROVIDER_SITE_OTHER): Payer: Medicare Other | Admitting: Ophthalmology

## 2012-04-09 DIAGNOSIS — H3530 Unspecified macular degeneration: Secondary | ICD-10-CM

## 2012-04-09 DIAGNOSIS — H35039 Hypertensive retinopathy, unspecified eye: Secondary | ICD-10-CM

## 2012-04-09 DIAGNOSIS — H33309 Unspecified retinal break, unspecified eye: Secondary | ICD-10-CM

## 2012-04-09 DIAGNOSIS — H35359 Cystoid macular degeneration, unspecified eye: Secondary | ICD-10-CM

## 2012-04-09 DIAGNOSIS — H43819 Vitreous degeneration, unspecified eye: Secondary | ICD-10-CM

## 2012-04-09 DIAGNOSIS — I10 Essential (primary) hypertension: Secondary | ICD-10-CM

## 2012-05-21 ENCOUNTER — Encounter (INDEPENDENT_AMBULATORY_CARE_PROVIDER_SITE_OTHER): Payer: Medicare Other | Admitting: Ophthalmology

## 2012-05-21 DIAGNOSIS — H35359 Cystoid macular degeneration, unspecified eye: Secondary | ICD-10-CM

## 2012-05-21 DIAGNOSIS — H3530 Unspecified macular degeneration: Secondary | ICD-10-CM

## 2012-05-21 DIAGNOSIS — H35039 Hypertensive retinopathy, unspecified eye: Secondary | ICD-10-CM

## 2012-05-21 DIAGNOSIS — I10 Essential (primary) hypertension: Secondary | ICD-10-CM

## 2012-05-21 DIAGNOSIS — H43819 Vitreous degeneration, unspecified eye: Secondary | ICD-10-CM

## 2012-07-16 ENCOUNTER — Encounter (INDEPENDENT_AMBULATORY_CARE_PROVIDER_SITE_OTHER): Payer: Medicare Other | Admitting: Ophthalmology

## 2012-07-16 DIAGNOSIS — I10 Essential (primary) hypertension: Secondary | ICD-10-CM

## 2012-07-16 DIAGNOSIS — H35039 Hypertensive retinopathy, unspecified eye: Secondary | ICD-10-CM

## 2012-07-16 DIAGNOSIS — H35359 Cystoid macular degeneration, unspecified eye: Secondary | ICD-10-CM

## 2012-07-16 DIAGNOSIS — H43819 Vitreous degeneration, unspecified eye: Secondary | ICD-10-CM

## 2012-07-24 ENCOUNTER — Other Ambulatory Visit: Payer: Self-pay | Admitting: Family Medicine

## 2012-07-28 ENCOUNTER — Other Ambulatory Visit: Payer: Self-pay | Admitting: *Deleted

## 2012-07-28 MED ORDER — HYDROCHLOROTHIAZIDE 12.5 MG PO CAPS: ORAL_CAPSULE | ORAL | Status: DC

## 2012-08-07 ENCOUNTER — Other Ambulatory Visit: Payer: Self-pay | Admitting: *Deleted

## 2012-08-07 MED ORDER — POTASSIUM CHLORIDE CRYS ER 20 MEQ PO TBCR: 20.0000 meq | EXTENDED_RELEASE_TABLET | Freq: Every day | ORAL | Status: DC

## 2012-08-27 ENCOUNTER — Encounter (INDEPENDENT_AMBULATORY_CARE_PROVIDER_SITE_OTHER): Payer: Medicare Other | Admitting: Ophthalmology

## 2012-08-27 DIAGNOSIS — H33309 Unspecified retinal break, unspecified eye: Secondary | ICD-10-CM

## 2012-08-27 DIAGNOSIS — H35359 Cystoid macular degeneration, unspecified eye: Secondary | ICD-10-CM

## 2012-08-27 DIAGNOSIS — H43819 Vitreous degeneration, unspecified eye: Secondary | ICD-10-CM

## 2012-08-27 DIAGNOSIS — I10 Essential (primary) hypertension: Secondary | ICD-10-CM

## 2012-08-27 DIAGNOSIS — H35039 Hypertensive retinopathy, unspecified eye: Secondary | ICD-10-CM

## 2012-09-23 ENCOUNTER — Ambulatory Visit (INDEPENDENT_AMBULATORY_CARE_PROVIDER_SITE_OTHER): Payer: Medicare Other | Admitting: Family Medicine

## 2012-09-23 ENCOUNTER — Encounter: Payer: Self-pay | Admitting: Family Medicine

## 2012-09-23 VITALS — BP 120/70 | Temp 98.8°F | Wt 210.0 lb

## 2012-09-23 DIAGNOSIS — E876 Hypokalemia: Secondary | ICD-10-CM

## 2012-09-23 DIAGNOSIS — I10 Essential (primary) hypertension: Secondary | ICD-10-CM

## 2012-09-23 DIAGNOSIS — J309 Allergic rhinitis, unspecified: Secondary | ICD-10-CM

## 2012-09-23 LAB — BASIC METABOLIC PANEL
GFR: 90.04 mL/min (ref >60.00)
Glucose, Bld: 95 mg/dL (ref 70–99)
Potassium: 3.5 mEq/L (ref 3.5–5.1)
Sodium: 137 mEq/L (ref 135–145)

## 2012-09-23 MED ORDER — PREDNISONE 10 MG PO TABS: ORAL_TABLET | ORAL | Status: DC

## 2012-09-23 NOTE — Progress Notes (Signed)
  Subjective:    Patient ID: Brett Blankenship, male    DOB: 1927/04/25, 79 y.o.   MRN: [id number]  HPI  Medical followup Patient has history of hypertension Mild hypokalemia with potassium 3.2 when checked last visit Compliant with medications. He remains on amlodipine and HCTZ. This has controlled his blood pressure well for several years Denies any side effects. No recent chest pains. No dizziness.  Recently had increased problems with allergies. He has frequent sneezing, nasal congestion, and nonproductive cough. Symptoms are worse outside with mowing Tried loratadine 10 mg without any improvement Denies any recent fever. No dyspnea. No history of smoking.  He remains quite active with things like gardening. No recent falls and has good balance.  Past Medical History  Diagnosis Date  . HYPERTENSION 07/12/2009   Past Surgical History  Procedure Laterality Date  . Appendectomy  1960  . Spine surgery  2004    fracture cervical spine    reports that he quit smoking about 49 years ago. His smoking use included Cigarettes. He has a 1.5 pack-year smoking history. He does not have any smokeless tobacco history on file. His alcohol and drug histories are not on file. family history includes Cancer in his brother and Hypertension in his father. No Known Allergies    Review of Systems  Constitutional: Negative for fatigue.  HENT: Positive for congestion and sore throat.   Eyes: Negative for visual disturbance.  Respiratory: Positive for cough. Negative for chest tightness, shortness of breath and wheezing.   Cardiovascular: Negative for chest pain, palpitations and leg swelling.  Neurological: Negative for dizziness, syncope, weakness, light-headedness and headaches.       Objective:   Physical Exam  Constitutional: He appears well-developed and well-nourished.  HENT:  Right Ear: External ear normal.  Left Ear: External ear normal.  Mouth/Throat: Oropharynx is clear and  moist.  Neck: Neck supple. No thyromegaly present.  Cardiovascular: Normal rate and regular rhythm.   Pulmonary/Chest: Effort normal and breath sounds normal. No respiratory distress. He has no wheezes. He has no rales.  Musculoskeletal: He exhibits no edema.          Assessment & Plan:  #1 hypertension. Well controlled. Continue current medications #2 history of hypokalemia. Recheck basic metabolic panel. Patient taking potassium supplement reportedly #3 allergic rhinitis, seasonal. No relief with antihistamines. Discussed trial of Allegra. Consider nasal steroid. Wrote for limited oral prednisone and reviewed potential side effects. He is aware that this is not for long-term treatment but only to help bring acute symptoms under better control

## 2012-09-24 NOTE — Progress Notes (Signed)
Quick Note:  Pt wife informed ______ 

## 2012-10-27 ENCOUNTER — Encounter (INDEPENDENT_AMBULATORY_CARE_PROVIDER_SITE_OTHER): Payer: Medicare Other | Admitting: Ophthalmology

## 2012-10-27 DIAGNOSIS — I10 Essential (primary) hypertension: Secondary | ICD-10-CM

## 2012-10-27 DIAGNOSIS — H43819 Vitreous degeneration, unspecified eye: Secondary | ICD-10-CM

## 2012-10-27 DIAGNOSIS — H33309 Unspecified retinal break, unspecified eye: Secondary | ICD-10-CM

## 2012-10-27 DIAGNOSIS — H3530 Unspecified macular degeneration: Secondary | ICD-10-CM

## 2012-10-27 DIAGNOSIS — H35359 Cystoid macular degeneration, unspecified eye: Secondary | ICD-10-CM

## 2012-10-27 DIAGNOSIS — H35039 Hypertensive retinopathy, unspecified eye: Secondary | ICD-10-CM

## 2013-01-23 ENCOUNTER — Other Ambulatory Visit: Payer: Self-pay | Admitting: Family Medicine

## 2013-01-29 ENCOUNTER — Encounter: Payer: Self-pay | Admitting: Family Medicine

## 2013-01-29 ENCOUNTER — Ambulatory Visit (INDEPENDENT_AMBULATORY_CARE_PROVIDER_SITE_OTHER): Payer: Medicare Other | Admitting: Family Medicine

## 2013-01-29 VITALS — BP 130/68 | HR 65 | Temp 98.3°F | Wt 209.0 lb

## 2013-01-29 DIAGNOSIS — R229 Localized swelling, mass and lump, unspecified: Secondary | ICD-10-CM

## 2013-01-29 DIAGNOSIS — Z23 Encounter for immunization: Secondary | ICD-10-CM

## 2013-01-29 NOTE — Patient Instructions (Signed)
Keep wound dry for the first 24 hours then clean daily with soap and water for one week. Apply topical antibiotic daily for 3-4 days. Keep covered with clean dressing for 4-5 days. Follow up promptly for any signs of infection such as redness, warmth, pain, or drainage.  

## 2013-01-29 NOTE — Progress Notes (Signed)
  Subjective:    Patient ID: Brett Blankenship, male    DOB: [birth year], 79 y.o.   MRN: [id number]  HPI Patient seen with prominent nodule left tip of nose. Present for several months possibly growing in size. Earlier this week he noticed a little bleeding and reddish discoloration. No prior history of skin cancer. Denies any itching. No pain. No surrounding cellulitis changes. No history of trauma.  Patient also requesting flu vaccine.  Past Medical History  Diagnosis Date  . HYPERTENSION 07/12/2009   Past Surgical History  Procedure Laterality Date  . Appendectomy  1960  . Spine surgery  2004    fracture cervical spine    reports that he quit smoking about 49 years ago. His smoking use included Cigarettes. He has a 1.5 pack-year smoking history. He does not have any smokeless tobacco history on file. His alcohol and drug histories are not on file. family history includes Cancer in his brother; Hypertension in his father. No Known Allergies    Review of Systems  Constitutional: Negative for appetite change and unexpected weight change.       Objective:   Physical Exam  Constitutional: He appears well-developed and well-nourished.  Cardiovascular: Normal rate.   Pulmonary/Chest: Effort normal and breath sounds normal. No respiratory distress. He has no wheezes. He has no rales.  Skin:  Patient has nodule left tip of nose about 3 mm diameter. Slightly umbilicated crusted center.         Assessment & Plan:  Nodule left tip of nose. Rule out basal cell versus squamous cell carcinoma.Discussed risks and benefits of shave biopsy including risks of bleeding, bruising, scar formation, and infection and patient consented to proceed.  Skin prepped with betadine and alcohol and shave excision of lesion with #15 blade with minimal bleeding after anesthesia with 1% plain Xylocaine.  Patient tolerated well. Minimal bleeding controlled with silver nitrate.   Antibiotic and dressing applied.   If this proves to be basal cell or squamous cell will refer to dermatology.

## 2013-02-05 ENCOUNTER — Encounter: Payer: Self-pay | Admitting: Family Medicine

## 2013-02-05 ENCOUNTER — Ambulatory Visit (INDEPENDENT_AMBULATORY_CARE_PROVIDER_SITE_OTHER): Payer: Medicare Other | Admitting: Family Medicine

## 2013-02-05 VITALS — BP 132/62 | HR 60 | Temp 97.8°F | Wt 208.0 lb

## 2013-02-05 DIAGNOSIS — I10 Essential (primary) hypertension: Secondary | ICD-10-CM

## 2013-02-05 DIAGNOSIS — C4491 Basal cell carcinoma of skin, unspecified: Secondary | ICD-10-CM

## 2013-02-05 NOTE — Patient Instructions (Signed)
We will call you with appointment for dermatologist.  Basal Cell Carcinoma Basal cell carcinoma is the most common form of skin cancer. It begins in the basal cells, which are at the bottom of the outer skin layer (epidermis). CAUSES  Sun exposure is the most common cause of basal cell carcinoma. Basal cell carcinoma occurs most often on parts of the body that are frequently exposed to the sun, including the:  Scalp.  Ears.  Neck.  Face.  Arms.  Backs of the hands.  Legs. However, basal cell carcinoma can occur anywhere on the body. Rarely, tumors develop on areas not exposed to the sun. Other causes of basal cell carcinoma can include:  Exposure to arsenic.  Exposure to radiation.  Certain genetic syndromes, such as xeroderma pigmentosum. RISK FACTORS People at highest risk for basal cell carcinoma include those with:  Fair skin.  Blonde or red hair.  Blue, green, or gray eyes.  Childhood freckling. Factors that increase your risk for basal cell carcinoma include:  Sun exposure over long periods of time. Childhood sun exposure appears to be a more significant factor than sun exposure as an adult.  Repeated sunburns.  Use of tanning beds.  Having a weakened immune system. SYMPTOMS Five signs of basal cell carcinoma are:  An open sore that bleeds, oozes, or crusts. The sore may remain open for 3 or more weeks. This can be an early sign of basal cell carcinoma. Basal cell carcinoma can mimic a pimple that will not heal.  A reddish or irritated area which may crust, itch, or cause discomfort. This may occur on areas expose d to the sun. These patches might be easier felt than seen.  A shiny, pearly, or translucent bump that is pink, red, or white. The bump may also be tan, black, or brown, especially in dark haired people. These bumps can be confused with moles.  A pink growth with a slightly elevated, rolled border, and a crusted indentation in the center. As the  growth slowly enlarges, tiny blood vessels may develop on the surface.  A scar-like white, yellow, or waxy area that looks like shiny, stretched skin. It often has irregular borders. This may be a sign of more aggressive basal cell carcinoma. DIAGNOSIS  Your caregiver may be able to tell what is wrong by doing a physical exam. Often, a tissue sample (biopsy) is also taken. The tissue is examined under a microscope.  TREATMENT  The treatment for basal cell carcinoma depends on the type, size, location, and number of tumors. Possible treatments include:   Mohs surgery. This is a procedure done by a skin doctor (dermatologist or Mohs surgeon) in his or her office. The cancerous cells are removed layer by layer. This treatment has a high cure rate.  Surgical removal of the tumor.  Freezing the tumor with liquid nitrogen (cryosurgery).  Plastic surgery to remove the tumor, in the case of large tumors.  Radiation. This may be used for tumors on the face.  Photodynamic therapy. A chemical cream is applied to the skin and light exposure is used to activate the chemical.  Chemical treatments, such as imiquimod cream and interferon injections. This may be used to remove superficial tumors with minimal scarring.  Electrodesiccation and curettage. This involves alternately scraping and burning the tumor, using an electric current to control bleeding. Basal cell carcinoma can almost always be cured. It rarely spreads to other areas of the body (metastasizes). Basal cell carcinoma may come back at  the same location (recur), but it can be treated again if this occurs. PREVENTION  Avoid the sun between 10:00 a.m. and 4:00 pm when it is the strongest.  Use a sunscreen or sunblock with a sun protection factor of 30 or greater.  Apply sunscreen at least 30 minutes before exposure to the sun.  Reapply sunscreen every 2 to 4 hours while you are outside, after swimming, and after excessive  sweating.  Always wear protective hats, clothing, and sunglasses with ultraviolet protection.  Avoid tanning beds. HOME CARE INSTRUCTIONS   Avoid unprotected sun exposure.  Follow your caregiver's instructions for self-exams. Look for new spots or changes in your skin.  Keep all follow-up appointments as directed by your caregiver. SEEK MEDICAL CARE IF:   You notice any new spots or changes in your skin.  You have had a basal cell carcinoma tumor removed and you notice a new growth in the same location. Document Released: 10/20/2002 Document Revised: 10/15/2011 Document Reviewed: 01/07/2011 [facility] Patient Information 2014 [place], [place].

## 2013-02-05 NOTE — Progress Notes (Signed)
  Subjective:    Patient ID: Brett Blankenship, male    DOB: Jan 04, 1927, 79 y.o.   MRN: [id number]  HPI Followup basal cell carcinoma tip of nose. Would perform biopsy with shave excision last week. Had excellent healing. No prior history of any skin cancers. We discussed with patient that if this came back positive we would consider dermatology referral.  Blood pressure remains well-controlled with amlodipine and HCTZ. No dizziness. No headaches. No chest pains. He remains very active physically. Flu vaccine last visit  Past Medical History  Diagnosis Date  . HYPERTENSION 07/12/2009   Past Surgical History  Procedure Laterality Date  . Appendectomy  1960  . Spine surgery  2004    fracture cervical spine    reports that he quit smoking about 49 years ago. His smoking use included Cigarettes. He has a 1.5 pack-year smoking history. He does not have any smokeless tobacco history on file. His alcohol and drug histories are not on file. family history includes Cancer in his brother; Hypertension in his father. No Known Allergies    Review of Systems  Constitutional: Negative for fatigue.  Eyes: Negative for visual disturbance.  Respiratory: Negative for cough, chest tightness and shortness of breath.   Cardiovascular: Negative for chest pain, palpitations and leg swelling.  Neurological: Negative for dizziness, syncope, weakness, light-headedness and headaches.       Objective:   Physical Exam  Constitutional: He appears well-developed and well-nourished.  Cardiovascular: Normal rate and regular rhythm.   Pulmonary/Chest: Effort normal and breath sounds normal. No respiratory distress. He has no wheezes. He has no rales.  Skin:  Left tip of nose healing well from recent shave excision basal cell carcinoma. No signs of secondary infection          Assessment & Plan:  #1 basal cell carcinoma nose. Dermatology referral #2 hypertension. Well controlled. Continue current  medications.

## 2013-02-06 ENCOUNTER — Other Ambulatory Visit: Payer: Self-pay | Admitting: Family Medicine

## 2013-03-04 ENCOUNTER — Ambulatory Visit (INDEPENDENT_AMBULATORY_CARE_PROVIDER_SITE_OTHER): Payer: Medicare Other | Admitting: Ophthalmology

## 2013-03-04 DIAGNOSIS — H35039 Hypertensive retinopathy, unspecified eye: Secondary | ICD-10-CM

## 2013-03-04 DIAGNOSIS — I10 Essential (primary) hypertension: Secondary | ICD-10-CM

## 2013-03-04 DIAGNOSIS — H43819 Vitreous degeneration, unspecified eye: Secondary | ICD-10-CM

## 2013-03-04 DIAGNOSIS — H35359 Cystoid macular degeneration, unspecified eye: Secondary | ICD-10-CM

## 2013-03-04 DIAGNOSIS — H33309 Unspecified retinal break, unspecified eye: Secondary | ICD-10-CM

## 2013-03-29 ENCOUNTER — Ambulatory Visit (INDEPENDENT_AMBULATORY_CARE_PROVIDER_SITE_OTHER): Payer: Medicare Other | Admitting: Family Medicine

## 2013-03-29 ENCOUNTER — Encounter: Payer: Self-pay | Admitting: Family Medicine

## 2013-03-29 VITALS — BP 128/62 | HR 58 | Temp 97.9°F | Wt 213.0 lb

## 2013-03-29 DIAGNOSIS — I10 Essential (primary) hypertension: Secondary | ICD-10-CM

## 2013-03-29 DIAGNOSIS — Z23 Encounter for immunization: Secondary | ICD-10-CM

## 2013-03-29 DIAGNOSIS — E669 Obesity, unspecified: Secondary | ICD-10-CM | POA: Insufficient documentation

## 2013-03-29 NOTE — Progress Notes (Signed)
Pre visit review using our clinic review tool, if applicable. No additional management support is needed unless otherwise documented below in the visit note. 

## 2013-03-29 NOTE — Progress Notes (Signed)
   Subjective:    Patient ID: Brett Blankenship, male    DOB: Nov 16, 1926, 79 y.o.   MRN: [id number]  HPI Followup hypertension. Patient remains on amlodipine and HCTZ. Denies any recent dizziness, chest pains, or dyspnea. He stays very reactive. No recent fatigue issues. Compliant with medications. He has occasional trace edema legs but not progressive. No recent falls. No syncope.  Recent basal cell skin cancer excised from nose and he is recovering well.  Past Medical History  Diagnosis Date  . HYPERTENSION 07/12/2009   Past Surgical History  Procedure Laterality Date  . Appendectomy  1960  . Spine surgery  2004    fracture cervical spine    reports that he quit smoking about 49 years ago. His smoking use included Cigarettes. He has a 1.5 pack-year smoking history. He does not have any smokeless tobacco history on file. His alcohol and drug histories are not on file. family history includes Cancer in his brother; Hypertension in his father. No Known Allergies    Review of Systems  Constitutional: Negative for fatigue.  Eyes: Negative for visual disturbance.  Respiratory: Negative for cough, chest tightness and shortness of breath.   Cardiovascular: Negative for chest pain, palpitations and leg swelling.  Neurological: Negative for dizziness, syncope, weakness, light-headedness and headaches.       Objective:   Physical Exam  Constitutional: He appears well-developed and well-nourished.  Neck: Neck supple. No thyromegaly present.  Cardiovascular: Normal rate.  Exam reveals no gallop.   Pulmonary/Chest: Effort normal and breath sounds normal. No respiratory distress. He has no wheezes. He has no rales.  Musculoskeletal:  No pitting edema          Assessment & Plan:  Hypertension. Stable and at goal. Continue current medications. Routine followup 6 months. Flu vaccine already given. Recommended Prevnar 13 and patient agrees

## 2013-03-29 NOTE — Addendum Note (Signed)
Addended by: Thomasena Edis on: 03/29/2013 02:11 PM   Modules accepted: Orders

## 2013-07-02 ENCOUNTER — Ambulatory Visit (INDEPENDENT_AMBULATORY_CARE_PROVIDER_SITE_OTHER): Payer: Medicare Other | Admitting: Ophthalmology

## 2013-07-12 ENCOUNTER — Ambulatory Visit (INDEPENDENT_AMBULATORY_CARE_PROVIDER_SITE_OTHER): Payer: Medicare Other | Admitting: Ophthalmology

## 2013-07-12 DIAGNOSIS — I10 Essential (primary) hypertension: Secondary | ICD-10-CM

## 2013-07-12 DIAGNOSIS — H33309 Unspecified retinal break, unspecified eye: Secondary | ICD-10-CM

## 2013-07-12 DIAGNOSIS — H35039 Hypertensive retinopathy, unspecified eye: Secondary | ICD-10-CM

## 2013-07-12 DIAGNOSIS — H3530 Unspecified macular degeneration: Secondary | ICD-10-CM

## 2013-07-12 DIAGNOSIS — H35359 Cystoid macular degeneration, unspecified eye: Secondary | ICD-10-CM

## 2013-07-12 DIAGNOSIS — H43819 Vitreous degeneration, unspecified eye: Secondary | ICD-10-CM

## 2013-07-24 ENCOUNTER — Other Ambulatory Visit: Payer: Self-pay | Admitting: Family Medicine

## 2013-09-27 ENCOUNTER — Ambulatory Visit: Payer: Medicare Other | Admitting: Family Medicine

## 2013-09-28 ENCOUNTER — Encounter: Payer: Self-pay | Admitting: Family Medicine

## 2013-09-28 ENCOUNTER — Ambulatory Visit (INDEPENDENT_AMBULATORY_CARE_PROVIDER_SITE_OTHER): Payer: Medicare Other | Admitting: Family Medicine

## 2013-09-28 VITALS — BP 140/68 | HR 60 | Temp 98.0°F | Wt 205.0 lb

## 2013-09-28 DIAGNOSIS — I10 Essential (primary) hypertension: Secondary | ICD-10-CM

## 2013-09-28 LAB — BASIC METABOLIC PANEL
BUN: 16 mg/dL (ref 6–23)
CALCIUM: 9.8 mg/dL (ref 8.4–10.5)
CO2: 27 meq/L (ref 19–32)
Chloride: 105 mEq/L (ref 96–112)
Creatinine, Ser: 0.9 mg/dL (ref 0.4–1.5)
GFR: 86.34 mL/min (ref >60.00)
Glucose, Bld: 89 mg/dL (ref 70–99)
Potassium: 3.7 mEq/L (ref 3.5–5.1)
Sodium: 139 mEq/L (ref 135–145)

## 2013-09-28 NOTE — Progress Notes (Signed)
   Subjective:    Patient ID: Brett Blankenship, male    DOB: 11-25-1926, 80 y.o.   MRN: [id number]  HPI  Followup hypertension. He takes regimen of HCTZ and amlodipine. He stays very active with gardening and had no recent complaints. Some mild peripheral edema which is unchanged. No dizziness. No headaches. No chest pains. Compliant with therapy. History of mild hypokalemia in the past. He takes potassium replacement  Past Medical History  Diagnosis Date  . HYPERTENSION 07/12/2009   Past Surgical History  Procedure Laterality Date  . Appendectomy  1960  . Spine surgery  2004    fracture cervical spine    reports that he quit smoking about 50 years ago. His smoking use included Cigarettes. He has a 1.5 pack-year smoking history. He does not have any smokeless tobacco history on file. His alcohol and drug histories are not on file. family history includes Cancer in his brother; Hypertension in his father. No Known Allergies    Review of Systems  Constitutional: Negative for fatigue.  Eyes: Negative for visual disturbance.  Respiratory: Negative for cough, chest tightness and shortness of breath.   Cardiovascular: Negative for chest pain, palpitations and leg swelling.  Neurological: Negative for dizziness, syncope, weakness, light-headedness and headaches.       Objective:   Physical Exam  Constitutional: He is oriented to person, place, and time. He appears well-developed and well-nourished.  HENT:  Right Ear: External ear normal.  Left Ear: External ear normal.  Mouth/Throat: Oropharynx is clear and moist.  Eyes: Pupils are equal, round, and reactive to light.  Neck: Neck supple. No thyromegaly present.  Cardiovascular: Normal rate and regular rhythm.   Pulmonary/Chest: Effort normal and breath sounds normal. No respiratory distress. He has no wheezes. He has no rales.  Musculoskeletal: He exhibits no edema.  Neurological: He is alert and oriented to person, place, and  time.          Assessment & Plan:  Hypertension. Stable. Recheck basic metabolic panel. Continue current medications. We discussed potassium rich diet.

## 2013-09-28 NOTE — Progress Notes (Signed)
Pre visit review using our clinic review tool, if applicable. No additional management support is needed unless otherwise documented below in the visit note. 

## 2013-09-28 NOTE — Patient Instructions (Signed)
Potassium Content of Foods Potassium is a mineral found in many foods and drinks. It helps keep fluids and minerals balanced in your body and also affects how steadily your heart beats. The body needs potassium to control blood pressure and to keep the muscles and nervous system healthy. However, certain health conditions and medicine may require you to eat more or less potassium-rich foods and drinks. Your caregiver or dietitian will tell you how much potassium you should have each day. COMMON SERVING SIZES The list below tells you how big or small common portion sizes are:  1 oz.........4 stacked dice.  3 oz.........Deck of cards.  1 tsp........Tip of little finger.  1 tbsp......Thumb.  2 tbsp......Golf ball.   c...........Half of a fist.  1 c............A fist. FOODS AND DRINKS HIGH IN POTASSIUM More than 200 mg of potassium per serving. A serving size is  c (120 mL or noted gram weight) unless otherwise stated. While all the items on this list are high in potassium, some items are higher in potassium than others. Fruits  Apricots (sliced), 83 g.  Apricots (dried halves), 3 oz / 24 g.  Avocado (cubed),  c / 50 g.  Banana (sliced), 75 g.  Cantaloupe (cubed), 80 g.  Dates (pitted), 5 whole / 35 g.  Figs (dried), 4 whole / 32 g.  Guava, c / 55 g.  Honeydew, 1 wedge / 85 g.  Kiwi (sliced), 90 g.  Nectarine, 1 small / 129 g.  Orange, 1 medium / 131 g.  Orange juice.  Pomegranate seeds, 87 g.  Pomegranate juice.  Prunes (pitted), 3 whole / 30 g.  Prune juice, 3 oz / 90 mL.  Seedless raisins, 3 tbsp / 27 g. Vegetables  Artichoke,  of a medium / 64 g.  Asparagus (boiled), 90 g.  Baked beans,  c / 63 g.  Bamboo shoots,  c / 38 g.  Beets (cooked slices), 85 g.  Broccoli (boiled), 78 g.  Brussels sprout (boiled), 78 g.  Butternut squash (baked), 103 g.  Chickpea (cooked), 82 g.  Green peas (cooked), 80 g.  Hubbard squash (baked cubes),  c /  68 g.  Kidney beans (cooked), 5 tbsp / 55 g.  Lima beans (cooked),  c / 43 g.  Navy beans (cooked),  c / 61 g.  Potato (baked), 61 g.  Potato (boiled), 78 g.  Pumpkin (boiled), 123 g.  Refried beans,  c / 79 g.  Spinach (cooked),  c / 45 g.  Split peas (cooked),  c / 65 g.  Sun-dried tomatoes, 2 tbsp / 7 g.  Sweet potato (baked),  c / 50 g.  Tomato (chopped or sliced), 90 g.  Tomato juice.  Tomato paste, 4 tsp / 21 g.  Tomato sauce,  c / 61 g.  Vegetable juice.  White mushrooms (cooked), 78 g.  Yam (cooked or baked),  c / 34 g.  Zucchini squash (boiled), 90 g. Other Foods and Drinks  Almonds (whole),  c / 36 g.  Cashews (oil roasted),  c / 32 g.  Chocolate milk.  Chocolate pudding, 142 g.  Clams (steamed), 1.5 oz / 43 g.  Dark chocolate, 1.5 oz / 42 g.  Fish, 3 oz / 85 g.  King crab (steamed), 3 oz / 85 g.  Lobster (steamed), 4 oz / 113 g.  Milk (skim, 1%, 2%, whole), 1 c / 240 mL.  Milk chocolate, 2.3 oz / 66 g.  Milk shake.  Nonfat fruit   variety yogurt, 123 g.  Peanuts (oil roasted), 1 oz / 28 g.  Peanut butter, 2 tbsp / 32 g.  Pistachio nuts, 1 oz / 28 g.  Pumpkin seeds, 1 oz / 28 g.  Red meat (broiled, cooked, grilled), 3 oz / 85 g.  Scallops (steamed), 3 oz / 85 g.  Shredded wheat cereal (dry), 3 oblong biscuits / 75 g.  Spaghetti sauce,  c / 66 g.  Sunflower seeds (dry roasted), 1 oz / 28 g.  Veggie burger, 1 patty / 70 g. FOODS MODERATE IN POTASSIUM Between 150 mg and 200 mg per serving. A serving is  c (120 mL or noted gram weight) unless otherwise stated. Fruits  Grapefruit,  of the fruit / 123 g.  Grapefruit juice.  Pineapple juice.  Plums (sliced), 83 g.  Tangerine, 1 large / 120 g. Vegetables  Carrots (boiled), 78 g.  Carrots (sliced), 61 g.  Rhubarb (cooked with sugar), 120 g.  Rutabaga (cooked), 120 g.  Sweet corn (cooked), 75 g.  Yellow snap beans (cooked), 63 g. Other Foods and  Drinks   Bagel, 1 bagel / 98 g.  Chicken breast (roasted and chopped),  c / 70 g.  Chocolate ice cream / 66 g.  Pita bread, 1 large / 64 g.  Shrimp (steamed), 4 oz / 113 g.  Swiss cheese (diced), 70 g.  Vanilla ice cream, 66 g.  Vanilla pudding, 140 g. FOODS LOW IN POTASSIUM Less than 150 mg per serving. A serving size is  cup (120 mL or noted gram weight) unless otherwise stated. If you eat more than 1 serving of a food low in potassium, the food may be considered a food high in potassium. Fruits  Apple (slices), 55 g.  Apple juice.  Applesauce, 122 g.  Blackberries, 72 g.  Blueberries, 74 g.  Cranberries, 50 g.  Cranberry juice.  Fruit cocktail, 119 g.  Fruit punch.  Grapes, 46 g.  Grape juice.  Mandarin oranges (canned), 126 g.  Peach (slices), 77 g.  Pineapple (chunks), 83 g.  Raspberries, 62 g.  Red cherries (without pits), 78 g.  Strawberries (sliced), 83 g.  Watermelon (diced), 76 g. Vegetables  Alfalfa sprouts, 17 g.  Bell peppers (sliced), 46 g.  Cabbage (shredded), 35 g.  Cauliflower (boiled), 62 g.  Celery, 51 g.  Collard greens (boiled), 95 g.  Cucumber (sliced), 52 g.  Eggplant (cubed), 41 g.  Green beans (boiled), 63 g.  Lettuce (shredded), 1 c / 36 g.  Onions (sauteed), 44 g.  Radishes (sliced), 58 g.  Spaghetti squash, 51 g. Other Foods and Drinks  Angel food cake, 1 slice / 28 g.  Black tea.  Brown rice (cooked), 98 g.  Butter croissant, 1 medium / 57 g.  Carbonated soda.  Coffee.  Cheddar cheese (diced), 66 g.  Corn flake cereal (dry), 14 g.  Cottage cheese, 118 g.  Cream of rice cereal (cooked), 122 g.  Cream of wheat cereal (cooked), 126 g.  Crisped rice cereal (dry), 14 g.  Egg (boiled, fried, poached, omelet, scrambled), 1 large / 46 61 g.  English muffin, 1 muffin / 57 g.  Frozen ice pop, 1 pop / 55 g.  Graham cracker, 1 large rectangular cracker / 14 g.  Jelly beans, 112  g.  Non-dairy whipped topping.  Oatmeal, 88 g.  Orange sherbet, 74 g.  Puffed rice cereal (dry), 7 g.  Pasta (cooked), 70 g.  Rice cakes, 4 cakes / 36   g.  Sugared doughnut, 4 oz / 116 g.  White bread, 1 slice / 30 g.  White rice (cooked), 79 93 g.  Wild rice (cooked), 82 g.  Yellow cake, 1 slice / 68 g. Document Released: 11/27/2004 Document Revised: 04/01/2012 Document Reviewed: 08/30/2011 [employer] Patient Information 2014 [employer].  

## 2013-09-29 ENCOUNTER — Telehealth: Payer: Self-pay | Admitting: Family Medicine

## 2013-09-29 NOTE — Telephone Encounter (Signed)
Relevant patient education mailed to patient.  

## 2013-10-20 ENCOUNTER — Other Ambulatory Visit: Payer: Self-pay | Admitting: Family Medicine

## 2013-11-08 ENCOUNTER — Other Ambulatory Visit: Payer: Self-pay | Admitting: Family Medicine

## 2013-12-13 ENCOUNTER — Encounter (INDEPENDENT_AMBULATORY_CARE_PROVIDER_SITE_OTHER): Payer: Medicare Other | Admitting: Ophthalmology

## 2013-12-13 DIAGNOSIS — H35039 Hypertensive retinopathy, unspecified eye: Secondary | ICD-10-CM

## 2013-12-13 DIAGNOSIS — H43819 Vitreous degeneration, unspecified eye: Secondary | ICD-10-CM

## 2013-12-13 DIAGNOSIS — I10 Essential (primary) hypertension: Secondary | ICD-10-CM

## 2013-12-13 DIAGNOSIS — H3530 Unspecified macular degeneration: Secondary | ICD-10-CM

## 2013-12-13 DIAGNOSIS — H35359 Cystoid macular degeneration, unspecified eye: Secondary | ICD-10-CM

## 2014-06-20 ENCOUNTER — Ambulatory Visit (INDEPENDENT_AMBULATORY_CARE_PROVIDER_SITE_OTHER): Payer: Medicare Other | Admitting: Ophthalmology

## 2014-06-20 DIAGNOSIS — H43813 Vitreous degeneration, bilateral: Secondary | ICD-10-CM

## 2014-06-20 DIAGNOSIS — H59032 Cystoid macular edema following cataract surgery, left eye: Secondary | ICD-10-CM

## 2014-06-20 DIAGNOSIS — H33302 Unspecified retinal break, left eye: Secondary | ICD-10-CM

## 2014-06-20 DIAGNOSIS — H3531 Nonexudative age-related macular degeneration: Secondary | ICD-10-CM

## 2014-06-20 DIAGNOSIS — I10 Essential (primary) hypertension: Secondary | ICD-10-CM

## 2014-06-20 DIAGNOSIS — H35033 Hypertensive retinopathy, bilateral: Secondary | ICD-10-CM

## 2014-08-05 ENCOUNTER — Other Ambulatory Visit: Payer: Self-pay | Admitting: Family Medicine

## 2014-10-19 ENCOUNTER — Other Ambulatory Visit: Payer: Self-pay | Admitting: Family Medicine

## 2014-11-07 ENCOUNTER — Other Ambulatory Visit: Payer: Self-pay | Admitting: Family Medicine

## 2014-11-14 ENCOUNTER — Encounter: Payer: Self-pay | Admitting: Family Medicine

## 2014-11-14 ENCOUNTER — Ambulatory Visit (INDEPENDENT_AMBULATORY_CARE_PROVIDER_SITE_OTHER): Payer: Medicare Other | Admitting: Family Medicine

## 2014-11-14 VITALS — BP 140/66 | HR 60 | Temp 98.2°F | Wt 200.0 lb

## 2014-11-14 DIAGNOSIS — I10 Essential (primary) hypertension: Secondary | ICD-10-CM | POA: Diagnosis not present

## 2014-11-14 MED ORDER — AMLODIPINE BESYLATE 10 MG PO TABS: 10.0000 mg | ORAL_TABLET | Freq: Every day | ORAL | Status: DC

## 2014-11-14 MED ORDER — HYDROCHLOROTHIAZIDE 12.5 MG PO CAPS: 12.5000 mg | ORAL_CAPSULE | Freq: Every day | ORAL | Status: DC

## 2014-11-14 MED ORDER — POTASSIUM CHLORIDE CRYS ER 20 MEQ PO TBCR: 20.0000 meq | EXTENDED_RELEASE_TABLET | Freq: Every day | ORAL | Status: DC

## 2014-11-14 NOTE — Progress Notes (Signed)
Pre visit review using our clinic review tool, if applicable. No additional management support is needed unless otherwise documented below in the visit note. 

## 2014-11-14 NOTE — Progress Notes (Signed)
   Subjective:    Patient ID: Brett Blankenship, male    DOB: 01/01/1927, 81 y.o.   MRN: [id number]  HPI  Follow-up hypertension. Patient takes amlodipine and HCTZ and has been on this regimen for many years. He is currently struggling with his wife dealing with metastatic renal cancer. Overall feels well otherwise. Good appetite. No chest pains. No dizziness. Compliant with medications.  Past Medical History  Diagnosis Date  . HYPERTENSION 07/12/2009   Past Surgical History  Procedure Laterality Date  . Appendectomy  1960  . Spine surgery  2004    fracture cervical spine    reports that he quit smoking about 51 years ago. His smoking use included Cigarettes. He has a 1.5 pack-year smoking history. He does not have any smokeless tobacco history on file. His alcohol and drug histories are not on file. family history includes Cancer in his brother; Hypertension in his father. No Known Allergies   Review of Systems  Constitutional: Negative for fatigue.  Eyes: Negative for visual disturbance.  Respiratory: Negative for cough, chest tightness and shortness of breath.   Cardiovascular: Negative for chest pain, palpitations and leg swelling.  Neurological: Negative for dizziness, syncope, weakness, light-headedness and headaches.       Objective:   Physical Exam  Constitutional: He appears well-developed and well-nourished.  Cardiovascular: Normal rate and regular rhythm.   Pulmonary/Chest: Effort normal and breath sounds normal. No respiratory distress. He has no wheezes. He has no rales.  Musculoskeletal: He exhibits no edema.          Assessment & Plan:  Hypertension. Stable and at goal. Continue current medication regimen. Follow-up one year and sooner as needed

## 2014-11-18 ENCOUNTER — Encounter (INDEPENDENT_AMBULATORY_CARE_PROVIDER_SITE_OTHER): Payer: Medicare Other | Admitting: Ophthalmology

## 2014-11-18 DIAGNOSIS — H59032 Cystoid macular edema following cataract surgery, left eye: Secondary | ICD-10-CM

## 2014-11-18 DIAGNOSIS — H35033 Hypertensive retinopathy, bilateral: Secondary | ICD-10-CM

## 2014-11-18 DIAGNOSIS — H43813 Vitreous degeneration, bilateral: Secondary | ICD-10-CM

## 2014-11-18 DIAGNOSIS — I10 Essential (primary) hypertension: Secondary | ICD-10-CM | POA: Diagnosis not present

## 2014-11-18 DIAGNOSIS — H33302 Unspecified retinal break, left eye: Secondary | ICD-10-CM

## 2014-11-18 DIAGNOSIS — H3531 Nonexudative age-related macular degeneration: Secondary | ICD-10-CM | POA: Diagnosis not present

## 2015-05-24 ENCOUNTER — Ambulatory Visit (INDEPENDENT_AMBULATORY_CARE_PROVIDER_SITE_OTHER): Payer: Medicare Other | Admitting: Ophthalmology

## 2015-05-24 DIAGNOSIS — I10 Essential (primary) hypertension: Secondary | ICD-10-CM | POA: Diagnosis not present

## 2015-05-24 DIAGNOSIS — H33302 Unspecified retinal break, left eye: Secondary | ICD-10-CM

## 2015-05-24 DIAGNOSIS — H35033 Hypertensive retinopathy, bilateral: Secondary | ICD-10-CM

## 2015-05-24 DIAGNOSIS — H353131 Nonexudative age-related macular degeneration, bilateral, early dry stage: Secondary | ICD-10-CM | POA: Diagnosis not present

## 2015-05-24 DIAGNOSIS — H43813 Vitreous degeneration, bilateral: Secondary | ICD-10-CM | POA: Diagnosis not present

## 2015-05-24 DIAGNOSIS — H59032 Cystoid macular edema following cataract surgery, left eye: Secondary | ICD-10-CM

## 2015-09-18 DIAGNOSIS — H353132 Nonexudative age-related macular degeneration, bilateral, intermediate dry stage: Secondary | ICD-10-CM | POA: Diagnosis not present

## 2015-10-28 ENCOUNTER — Other Ambulatory Visit: Payer: Self-pay | Admitting: Family Medicine

## 2015-10-30 NOTE — Telephone Encounter (Signed)
Rx refill sent to pharmacy. 

## 2015-11-22 ENCOUNTER — Ambulatory Visit (INDEPENDENT_AMBULATORY_CARE_PROVIDER_SITE_OTHER): Payer: Medicare Other | Admitting: Ophthalmology

## 2015-11-22 DIAGNOSIS — H33302 Unspecified retinal break, left eye: Secondary | ICD-10-CM

## 2015-11-22 DIAGNOSIS — H353131 Nonexudative age-related macular degeneration, bilateral, early dry stage: Secondary | ICD-10-CM | POA: Diagnosis not present

## 2015-11-22 DIAGNOSIS — H35033 Hypertensive retinopathy, bilateral: Secondary | ICD-10-CM | POA: Diagnosis not present

## 2015-11-22 DIAGNOSIS — H43813 Vitreous degeneration, bilateral: Secondary | ICD-10-CM

## 2015-11-22 DIAGNOSIS — H59032 Cystoid macular edema following cataract surgery, left eye: Secondary | ICD-10-CM | POA: Diagnosis not present

## 2015-11-22 DIAGNOSIS — I10 Essential (primary) hypertension: Secondary | ICD-10-CM | POA: Diagnosis not present

## 2015-12-28 ENCOUNTER — Other Ambulatory Visit: Payer: Self-pay | Admitting: Family Medicine

## 2016-01-25 ENCOUNTER — Other Ambulatory Visit: Payer: Self-pay | Admitting: Family Medicine

## 2016-02-26 ENCOUNTER — Other Ambulatory Visit: Payer: Self-pay | Admitting: Family Medicine

## 2016-04-24 ENCOUNTER — Other Ambulatory Visit: Payer: Self-pay | Admitting: Family Medicine

## 2016-05-27 ENCOUNTER — Ambulatory Visit (INDEPENDENT_AMBULATORY_CARE_PROVIDER_SITE_OTHER): Payer: Medicare Other | Admitting: Ophthalmology

## 2016-05-27 ENCOUNTER — Ambulatory Visit: Payer: Medicare Other | Admitting: Family Medicine

## 2016-05-27 DIAGNOSIS — I10 Essential (primary) hypertension: Secondary | ICD-10-CM | POA: Diagnosis not present

## 2016-05-27 DIAGNOSIS — H43813 Vitreous degeneration, bilateral: Secondary | ICD-10-CM

## 2016-05-27 DIAGNOSIS — H59032 Cystoid macular edema following cataract surgery, left eye: Secondary | ICD-10-CM | POA: Diagnosis not present

## 2016-05-27 DIAGNOSIS — H35371 Puckering of macula, right eye: Secondary | ICD-10-CM

## 2016-05-27 DIAGNOSIS — H35033 Hypertensive retinopathy, bilateral: Secondary | ICD-10-CM | POA: Diagnosis not present

## 2016-05-27 DIAGNOSIS — H353121 Nonexudative age-related macular degeneration, left eye, early dry stage: Secondary | ICD-10-CM | POA: Diagnosis not present

## 2016-05-28 ENCOUNTER — Ambulatory Visit (INDEPENDENT_AMBULATORY_CARE_PROVIDER_SITE_OTHER): Payer: Medicare Other

## 2016-05-28 ENCOUNTER — Encounter: Payer: Self-pay | Admitting: Family Medicine

## 2016-05-28 ENCOUNTER — Encounter (INDEPENDENT_AMBULATORY_CARE_PROVIDER_SITE_OTHER): Payer: Self-pay

## 2016-05-28 ENCOUNTER — Ambulatory Visit (INDEPENDENT_AMBULATORY_CARE_PROVIDER_SITE_OTHER): Payer: Medicare Other | Admitting: Family Medicine

## 2016-05-28 VITALS — BP 130/60 | HR 58 | Temp 98.2°F | Ht 68.5 in | Wt 201.0 lb

## 2016-05-28 DIAGNOSIS — I10 Essential (primary) hypertension: Secondary | ICD-10-CM

## 2016-05-28 DIAGNOSIS — H524 Presbyopia: Secondary | ICD-10-CM | POA: Diagnosis not present

## 2016-05-28 DIAGNOSIS — R600 Localized edema: Secondary | ICD-10-CM

## 2016-05-28 DIAGNOSIS — R011 Cardiac murmur, unspecified: Secondary | ICD-10-CM | POA: Diagnosis not present

## 2016-05-28 DIAGNOSIS — G8929 Other chronic pain: Secondary | ICD-10-CM | POA: Diagnosis not present

## 2016-05-28 DIAGNOSIS — K591 Functional diarrhea: Secondary | ICD-10-CM

## 2016-05-28 DIAGNOSIS — Z125 Encounter for screening for malignant neoplasm of prostate: Secondary | ICD-10-CM

## 2016-05-28 DIAGNOSIS — M25562 Pain in left knee: Secondary | ICD-10-CM

## 2016-05-28 MED ORDER — PSYLLIUM 30.9 % PO POWD: ORAL | 11 refills | Status: DC

## 2016-05-28 NOTE — Progress Notes (Signed)
 Subjective:  Patient ID: Brett Blankenship, male    DOB: 09/21/1926  Age: 83 y.o. MRN: [id number]  CC: New Patient (Initial Visit) (pt here today as a new patient and c/o left leg pain x 1 month)   HPI Brett Blankenship presents for Pain in the knee radiating from the posterolateral aspect. There is pain noted into the mid thigh and down the upper shin.   follow-up of hypertension. Patient has no history of headache chest pain or shortness of breath or recent cough. Patient also denies symptoms of TIA such as numbness weakness lateralizing. Patient checks  blood pressure at home and has not had any elevated readings recently. Patient denies side effects from his medication. States taking it regularly.   History Brett Blankenship has a past medical history of HYPERTENSION (07/12/2009).   He has a past surgical history that includes Appendectomy (1960) and Spine surgery (2004).   His family history includes Cancer in his brother; Hypertension in his father.He reports that he quit smoking about 52 years ago. His smoking use included Cigarettes. He has a 1.50 pack-year smoking history. He has never used smokeless tobacco. His alcohol and drug histories are not on file.  Current Outpatient Prescriptions on File Prior to Visit  Medication Sig Dispense Refill  . amLODipine (NORVASC) 10 MG tablet TAKE ONE TABLET BY MOUTH ONCE DAILY. 90 tablet 1  . fish oil-omega-3 fatty acids 1000 MG capsule Take 2 g by mouth daily.      . hydrochlorothiazide (MICROZIDE) 12.5 MG capsule TAKE ONE CAPSULE DAILY. 90 capsule 1  . potassium chloride SA (K-DUR,KLOR-CON) 20 MEQ tablet TAKE ONE TABLET BY MOUTH ONCE DAILY. 30 tablet 5  . prednisoLONE acetate (PRED FORTE) 1 % ophthalmic suspension Place 1 drop into the left eye 4 (four) times daily.      No current facility-administered medications on file prior to visit.     ROS Review of Systems  Constitutional: Negative for chills, diaphoresis, fever and unexpected weight  change.  HENT: Negative for congestion, hearing loss, rhinorrhea and sore throat.   Eyes: Negative for visual disturbance.  Respiratory: Negative for cough and shortness of breath.   Cardiovascular: Negative for chest pain.  Gastrointestinal: Positive for diarrhea (intermittent. Loose with increased no. recuring randomly 3-5 days a month.). Negative for abdominal pain and constipation.  Genitourinary: Negative for dysuria and flank pain.  Musculoskeletal: Positive for arthralgias. Negative for joint swelling.  Skin: Negative for rash.  Neurological: Negative for dizziness and headaches.  Psychiatric/Behavioral: Negative for dysphoric mood and sleep disturbance.    Objective:  BP 130/60   Pulse (!) 58   Temp 98.2 F (36.8 C) (Oral)   Ht 5' 8.5" (1.74 m)   Wt 201 lb (91.2 kg)   BMI 30.12 kg/m   Physical Exam  Constitutional: He is oriented to person, place, and time. He appears well-developed and well-nourished. No distress.  HENT:  Head: Normocephalic and atraumatic.  Right Ear: External ear normal.  Left Ear: External ear normal.  Nose: Nose normal.  Mouth/Throat: Oropharynx is clear and moist.  Eyes: Conjunctivae and EOM are normal. Pupils are equal, round, and reactive to light.  Neck: Normal range of motion. Neck supple. No thyromegaly present.  Cardiovascular: Normal rate, regular rhythm, S1 normal and S2 normal.   Murmur heard.  Crescendo systolic murmur is present with a grade of 2/6   No diastolic murmur is present  Pulses:      Carotid pulses are 2+ on the   right side, and 2+ on the left side.      Dorsalis pedis pulses are 2+ on the right side, and 2+ on the left side.  Pulmonary/Chest: Effort normal and breath sounds normal. No respiratory distress. He has no wheezes. He has no rales.  Abdominal: Soft. Bowel sounds are normal. He exhibits no distension. There is no tenderness.  Musculoskeletal:       Left knee: He exhibits swelling, effusion, bony tenderness and  MCL laxity. He exhibits no erythema. Tenderness found. Lateral joint line tenderness noted. No patellar tendon tenderness noted.  Lymphadenopathy:    He has no cervical adenopathy.  Neurological: He is alert and oriented to person, place, and time. He has normal reflexes.  Skin: Skin is warm and dry.  Psychiatric: He has a normal mood and affect. His behavior is normal. Judgment and thought content normal.    Assessment & Plan:   Brett Blankenship was seen today for new patient (initial visit).  Diagnoses and all orders for this visit:  Essential hypertension -     CBC with Differential/Platelet -     CMP14+EGFR -     Lipid panel -     TSH  Chronic pain of left knee -     CBC with Differential/Platelet -     CMP14+EGFR -     Lipid panel -     DG Knee 1-2 Views Left; Future -     Ambulatory referral to Physical Therapy  Edema of leg -     CBC with Differential/Platelet -     CMP14+EGFR -     Lipid panel -     TSH -     Urinalysis -     DG Chest 2 View; Future -     Ambulatory referral to Physical Therapy  Heart murmur -     CBC with Differential/Platelet -     CMP14+EGFR -     Lipid panel -     DG Chest 2 View; Future -     ECHOCARDIOGRAM COMPLETE; Future  Functional diarrhea -     CBC with Differential/Platelet -     CMP14+EGFR -     Lipid panel -     TSH  Screening for prostate cancer -     PSA Total (Reflex To Free)  Other orders -     Psyllium 30.9 % POWD; Drink 1 tablespoon dissolved in water, Daily   I have discontinued Brett Blankenship's Multiple Vitamins-Minerals (PX SENIOR VITAMIN PO). I am also having him start on Psyllium. Additionally, I am having him maintain his fish oil-omega-3 fatty acids, prednisoLONE acetate, potassium chloride SA, amLODipine, hydrochlorothiazide, and Multiple Vitamins-Minerals (EYE VITAMINS & MINERALS PO).  Meds ordered this encounter  Medications  . Multiple Vitamins-Minerals (EYE VITAMINS & MINERALS PO)    Sig: Take by mouth.  [facility]  . Psyllium 30.9 % POWD    Sig: Drink 1 tablespoon dissolved in water, Daily    Dispense:  1 Bottle    Refill:  11     Follow-up: Return in about 6 weeks (around 07/09/2016).  Warren Stacks, M.D. 

## 2016-05-29 LAB — CBC WITH DIFFERENTIAL/PLATELET
BASOS ABS: 0 10*3/uL (ref 0.0–0.2)
Basos: 1 %
EOS (ABSOLUTE): 0.1 10*3/uL (ref 0.0–0.4)
EOS: 1 %
HEMATOCRIT: 37 % — AB (ref 37.5–51.0)
Hemoglobin: 12.4 g/dL — ABNORMAL LOW (ref 13.0–17.7)
IMMATURE GRANULOCYTES: 0 %
Immature Grans (Abs): 0 10*3/uL (ref 0.0–0.1)
LYMPHS ABS: 2.1 10*3/uL (ref 0.7–3.1)
LYMPHS: 32 %
MCH: 32.6 pg (ref 26.6–33.0)
MCHC: 33.5 g/dL (ref 31.5–35.7)
MCV: 97 fL (ref 79–97)
MONOCYTES: 10 %
Monocytes Absolute: 0.7 10*3/uL (ref 0.1–0.9)
NEUTROS PCT: 56 %
Neutrophils Absolute: 3.7 10*3/uL (ref 1.4–7.0)
PLATELETS: 144 10*3/uL — AB (ref 150–379)
RBC: 3.8 x10E6/uL — AB (ref 4.14–5.80)
RDW: 15 % (ref 12.3–15.4)
WBC: 6.6 10*3/uL (ref 3.4–10.8)

## 2016-05-29 LAB — TSH: TSH: 2.41 u[IU]/mL (ref 0.450–4.500)

## 2016-05-29 LAB — LIPID PANEL
CHOLESTEROL TOTAL: 118 mg/dL (ref 100–199)
Chol/HDL Ratio: 4.5 ratio units (ref 0.0–5.0)
HDL: 26 mg/dL — AB (ref >39)
LDL Calculated: 46 mg/dL (ref 0–99)
TRIGLYCERIDES: 231 mg/dL — AB (ref 0–149)
VLDL Cholesterol Cal: 46 mg/dL — ABNORMAL HIGH (ref 5–40)

## 2016-05-29 LAB — CMP14+EGFR
A/G RATIO: 1.4 (ref 1.2–2.2)
ALT: 7 IU/L (ref 0–44)
AST: 14 IU/L (ref 0–40)
Albumin: 4.1 g/dL (ref 3.5–4.7)
Alkaline Phosphatase: 51 IU/L (ref 39–117)
BUN/Creatinine Ratio: 16 (ref 10–24)
BUN: 13 mg/dL (ref 8–27)
Bilirubin Total: 0.4 mg/dL (ref 0.0–1.2)
CALCIUM: 9.3 mg/dL (ref 8.6–10.2)
CO2: 23 mmol/L (ref 18–29)
CREATININE: 0.8 mg/dL (ref 0.76–1.27)
Chloride: 100 mmol/L (ref 96–106)
GFR calc Af Amer: 93 mL/min/{1.73_m2} (ref >59)
GFR calc non Af Amer: 80 mL/min/{1.73_m2} (ref >59)
GLOBULIN, TOTAL: 2.9 g/dL (ref 1.5–4.5)
Glucose: 84 mg/dL (ref 65–99)
POTASSIUM: 3.7 mmol/L (ref 3.5–5.2)
SODIUM: 140 mmol/L (ref 134–144)
Total Protein: 7 g/dL (ref 6.0–8.5)

## 2016-05-29 LAB — PSA TOTAL (REFLEX TO FREE): Prostate Specific Ag, Serum: 1 ng/mL (ref 0.0–4.0)

## 2016-06-03 ENCOUNTER — Telehealth: Payer: Self-pay | Admitting: Family Medicine

## 2016-06-03 MED ORDER — DICLOFENAC SODIUM 75 MG PO TBEC: 75.0000 mg | DELAYED_RELEASE_TABLET | Freq: Two times a day (BID) | ORAL | 2 refills | Status: DC

## 2016-06-03 NOTE — Telephone Encounter (Signed)
I sent in the requested prescription 

## 2016-06-03 NOTE — Telephone Encounter (Signed)
Pt aware.

## 2016-06-03 NOTE — Telephone Encounter (Signed)
LMOVM that Rx was sent to pharmacy 

## 2016-06-03 NOTE — Telephone Encounter (Signed)
Wanting to know about medication for knee pain since kidney labwork came back normal, please send Rx to [facility]

## 2016-06-05 ENCOUNTER — Ambulatory Visit: Payer: Medicare Other | Attending: Family Medicine | Admitting: Physical Therapy

## 2016-06-05 DIAGNOSIS — M25662 Stiffness of left knee, not elsewhere classified: Secondary | ICD-10-CM | POA: Diagnosis not present

## 2016-06-05 DIAGNOSIS — M25562 Pain in left knee: Secondary | ICD-10-CM | POA: Diagnosis not present

## 2016-06-05 NOTE — Therapy (Signed)
[facility] [place], [place], [address] Phone: [phone]   Fax:  [phone]  Physical Therapy Evaluation  Patient Details  Name: Brett Blankenship MRN: [id number] Date of Birth: 03/21/1927 Referring Provider: Claretta Fraise MD  Encounter Date: 06/05/2016      PT End of Session - 06/05/16 1245    Activity Tolerance Patient tolerated treatment well   Behavior During Therapy [facility] for tasks assessed/performed      Past Medical History:  Diagnosis Date  . HYPERTENSION 07/12/2009    Past Surgical History:  Procedure Laterality Date  . APPENDECTOMY  1960  . SPINE SURGERY  2004   fracture cervical spine    There were no vitals filed for this visit.       Subjective Assessment - 06/05/16 1108    Subjective The patient presents to OPPT with c/o left knee pain worsening over the last 4 months.  He had an occasion when he had a sudden onset of left knee pain that buckled his kee and nearly resulted in a fall.  His pain-level today is a 6/10 but can rise to 10/10 with the aforementioned pain episodes.   Patient is accompained by: Family member  Son.   Limitations Walking   How long can you walk comfortably? Community distances.   Patient Stated Goals Get out of pain.            [facility] PT Assessment - 06/05/16 0001      Assessment   Medical Diagnosis Left knee pain.   Referring Provider Claretta Fraise MD   Onset Date/Surgical Date --  4 months.     Precautions   Precautions Fall     Restrictions   Weight Bearing Restrictions No     Balance Screen   Has the patient fallen in the past 6 months No   Has the patient had a decrease in activity level because of a fear of falling?  Yes   Is the patient reluctant to leave their home because of a fear of falling?  Yes     [place] residence     Prior Function   Level of Independence Independent     Posture/Postural Control    Posture Comments Slump posture.  Knee held in flexion.     ROM / Strength   AROM / PROM / Strength AROM;Strength     AROM   Overall AROM Comments -15 degrees of left knee extension to 106 degrees of flexion.  RT also -15 degrees of extension.     Strength   Overall Strength Comments Left hip strength= 4-/5; left knee strength= 5/5.Marland Kitchen     Palpation   Palpation comment Tender to palpation in area of popliteal fossa of left kne.     Special Tests    Special Tests --  Stable left knee joint. Mod decrease in LT pat mob.     Ambulation/Gait   Gait Pattern Right flexed knee in stance;Left flexed knee in stance;Antalgic                             PT Short Term Goals - 06/05/16 1242      PT SHORT TERM GOAL #1   Title STG's=LTG's.           PT Long Term Goals - 06/05/16 1243      PT LONG TERM GOAL #1   Title Independent with  a HEP.   Time 8   Period Weeks   Status New     PT LONG TERM GOAL #2   Title Active left knee flexion to 115 degrees+ so the patient can perform functional tasks and do so with pain not > 2-3/10   Time 8   Period Weeks   Status New     PT LONG TERM GOAL #3   Title Increase left hip strength to 5/5 to increase stability of gait.   Time 8   Period Weeks   Status New     PT LONG TERM GOAL #4   Title Perform a reciprocating stair gait with one railing with pain not > 2-3/10.   Time 8   Period Weeks   Status New               Plan - 06/05/16 1238    Clinical Impression Statement The patient presents with loss of left knee range of motion and occasion of his knee giving way due to a sharp onset of knee pain.  He has hip weakness but his left knee is of normal strength.     Rehab Potential Excellent   PT Frequency 2x / week   PT Duration 8 weeks   PT Treatment/Interventions ADLs/Self Care Home Management;Cryotherapy;Electrical Stimulation;Moist Heat;Ultrasound;Patient/family education;Therapeutic  exercise;Therapeutic activities;Manual techniques;Passive range of motion;Vasopneumatic Device   PT Next Visit Plan Modalities and STW/M to left knee; patellar mobs; will send order for [place] (please check "other orders" tab).  PAIN-FREE left quad strengthening.  Left hip abduction strengthening.      Patient will benefit from skilled therapeutic intervention in order to improve the following deficits and impairments:  Pain, Decreased activity tolerance, Decreased strength, Decreased range of motion  Visit Diagnosis: Acute pain of left knee - Plan: PT plan of care cert/re-cert  Stiffness of left knee, not elsewhere classified - Plan: PT plan of care cert/re-cert      G-Codes - [id number]    Functional Assessment Tool Used FOTO.....56% limitation.   Functional Limitation Mobility: Walking and moving around   Mobility: Walking and Moving Around Current Status [phone]) At least 40 percent but less than 60 percent impaired, limited or restricted   Mobility: Walking and Moving Around Goal Status [phone]) At least 20 percent but less than 40 percent impaired, limited or restricted       Problem List Patient Active Problem List   Diagnosis Date Noted  . Obesity (BMI 30-39.9) 03/29/2013  . Basal cell carcinoma 02/05/2013  . Essential hypertension 07/12/2009    APPLE[place]ATE, [place] MPT 06/05/2016, 12:52 PM  [facility] [address] [place], [place], [address] Phone: [phone]   Fax:  [phone]  Name: Brett Blankenship MRN: [id number] Date of Birth: June 09, 1926

## 2016-06-10 ENCOUNTER — Telehealth: Payer: Self-pay

## 2016-06-10 NOTE — Telephone Encounter (Signed)
Son states his dad stated that Dr Livia Snellen wanted him to have his heart checked out and that our office would give him a call. Son states it has been 2 weeks and he hasn't heard anything. Please advise and route to pool B. Unsure of what you wanted done

## 2016-06-10 NOTE — Telephone Encounter (Signed)
Please check on the status of the echocardiogram I ordered. Thanks,   WS

## 2016-06-11 ENCOUNTER — Encounter: Payer: Self-pay | Admitting: Physical Therapy

## 2016-06-11 ENCOUNTER — Ambulatory Visit: Payer: Medicare Other | Admitting: Physical Therapy

## 2016-06-11 DIAGNOSIS — M25662 Stiffness of left knee, not elsewhere classified: Secondary | ICD-10-CM | POA: Diagnosis not present

## 2016-06-11 DIAGNOSIS — M25562 Pain in left knee: Secondary | ICD-10-CM

## 2016-06-11 NOTE — Therapy (Signed)
[facility] [place], [place], [address] Phone: [phone]   Fax:  [phone]  Physical Therapy Treatment  Patient Details  Name: Brett Blankenship MRN: [id number] Date of Birth: 1926/10/29 Referring Provider: Claretta Fraise MD  Encounter Date: 06/11/2016      PT End of Session - 06/11/16 1121    Visit Number 2   Number of Visits 16   Date for PT Re-Evaluation 08/04/16   PT Start Time 1119   PT Stop Time 1208   PT Time Calculation (min) 49 min   Activity Tolerance Patient tolerated treatment well   Behavior During Therapy [facility] for tasks assessed/performed      Past Medical History:  Diagnosis Date  . HYPERTENSION 07/12/2009    Past Surgical History:  Procedure Laterality Date  . APPENDECTOMY  1960  . SPINE SURGERY  2004   fracture cervical spine    There were no vitals filed for this visit.      Subjective Assessment - 06/11/16 1120    Subjective Reports that L knee pain is the worst and wishes to be told what to do at home. Reports that he has a treadmill at home and was wondering if he should be using it but hasn't been using it.   Limitations Walking   How long can you walk comfortably? Community distances.   Patient Stated Goals Get out of pain.   Currently in Pain? Yes   Pain Score 8    Pain Location Knee   Pain Orientation Left   Pain Descriptors / Indicators Discomfort   Pain Type Chronic pain            [facility] PT Assessment - 06/11/16 0001      Assessment   Medical Diagnosis Left knee pain.     Precautions   Precautions Fall     Restrictions   Weight Bearing Restrictions No                     [facility] Adult PT Treatment/Exercise - 06/11/16 0001      Exercises   Exercises Knee/Hip     Knee/Hip Exercises: Aerobic   Nustep L3, seat 10 x10 min     Knee/Hip Exercises: Seated   Long Arc Quad Strengthening;2 sets;10 reps;Left   Long Arc Quad Limitations Green theraband    Hamstring Curl Strengthening;Left;2 sets;10 reps   Hamstring Limitations Green theraband     Knee/Hip Exercises: Supine   Short Arc Quad Sets Strengthening;Left;2 sets;10 reps   Short Arc Quad Sets Limitations 3#   Straight Leg Raises AROM;Left;1 set;10 reps   Other Supine Knee/Hip Exercises B hip clamshell x20 reps green theraband     Modalities   Modalities Electrical Stimulation;Iontophoresis;Vasopneumatic     Theme park manager L knee   Chartered certified accountant IFC   Electrical Stimulation Parameters 1-10 hz x10 min   Electrical Stimulation Goals Pain;Edema     Iontophoresis   Type of Iontophoresis Dexamethasone   Dose 1 ml   Time 8     Vasopneumatic   Number Minutes Vasopneumatic  15 minutes   Vasopnuematic Location  Knee   Vasopneumatic Pressure Medium   Vasopneumatic Temperature  34                PT Education - 06/11/16 1210    Education provided Yes   Education Details HEP- SLR, knee ext and flex with green theraband   Person(s) Educated Patient  Methods Explanation;Verbal cues;Handout   Comprehension Verbalized understanding;Verbal cues required          PT Short Term Goals - 06/05/16 1242      PT SHORT TERM GOAL #1   Title STG's=LTG's.           PT Long Term Goals - 06/05/16 1243      PT LONG TERM GOAL #1   Title Independent with a HEP.   Time 8   Period Weeks   Status New     PT LONG TERM GOAL #2   Title Active left knee flexion to 115 degrees+ so the patient can perform functional tasks and do so with pain not > 2-3/10   Time 8   Period Weeks   Status New     PT LONG TERM GOAL #3   Title Increase left hip strength to 5/5 to increase stability of gait.   Time 8   Period Weeks   Status New     PT LONG TERM GOAL #4   Title Perform a reciprocating stair gait with one railing with pain not > 2-3/10.   Time 8   Period Weeks   Status New               Plan - 06/11/16  1202    Clinical Impression Statement Patient presented in clinic with continued reports of increased L knee pain especially in posterior L knee. Patient limited somewhat during treatment secondary to weakness or minimal pain. Patient was provided new HEP for knee/hip strengthening with educated regarding parameters and technique. Normal modalities response noted following removal of the modalities. Iontophoresis patch placed over superior L gastroc with education to patient to remove in 4 hours.   Rehab Potential Excellent   PT Frequency 2x / week   PT Duration 8 weeks   PT Treatment/Interventions ADLs/Self Care Home Management;Cryotherapy;Electrical Stimulation;Moist Heat;Ultrasound;Patient/family education;Therapeutic exercise;Therapeutic activities;Manual techniques;Passive range of motion;Vasopneumatic Device   PT Next Visit Plan Modalities and STW/M to left knee; patellar mobs; will send order for Ionto (please check "other orders" tab).  PAIN-FREE left quad strengthening.  Left hip abduction strengthening.   Consulted and Agree with Plan of Care Patient      Patient will benefit from skilled therapeutic intervention in order to improve the following deficits and impairments:  Pain, Decreased activity tolerance, Decreased strength, Decreased range of motion  Visit Diagnosis: Acute pain of left knee  Stiffness of left knee, not elsewhere classified     Problem List Patient Active Problem List   Diagnosis Date Noted  . Obesity (BMI 30-39.9) 03/29/2013  . Basal cell carcinoma 02/05/2013  . Essential hypertension 07/12/2009    Wynelle Fanny, PTA 06/11/2016, 12:11 PM  [facility] [address] [place], [place], [address] Phone: [phone]   Fax:  [phone]  Name: Brett Blankenship MRN: [id number] Date of Birth: 1926-07-22

## 2016-06-11 NOTE — Patient Instructions (Addendum)
Strengthening: Straight Leg Raise (Phase 1)    Tighten muscles on front of left thigh, then lift leg __5__ inches from surface, keeping knee locked.  Repeat __10-15__ times per set.  Do _2-3___ sessions per day.  http://orth.exer.us/614   Copyright  VHI. All rights reserved.  Knee Extension (Sitting)    Place green band around both ankles ankle and straighten left knee fully, lower slowly. Repeat __10__ times per set. Do ___2_ sets per session. Do __2-3__ sessions per day.  http://orth.exer.us/732   Copyright  VHI. All rights reserved.  Knee Flexion: Resisted (Sitting)    Sit with band under left foot and looped around ankle of supported leg. Pull left foot backwards and keep right knee straight. Repeat __10__ times per set. Do __2__ sets per session. Do __2-3__ sessions per day.  http://orth.exer.us/694   Copyright  VHI. All rights reserved.

## 2016-06-13 ENCOUNTER — Encounter: Payer: Self-pay | Admitting: Physical Therapy

## 2016-06-13 ENCOUNTER — Ambulatory Visit: Payer: Medicare Other | Admitting: Physical Therapy

## 2016-06-13 DIAGNOSIS — M25562 Pain in left knee: Secondary | ICD-10-CM

## 2016-06-13 DIAGNOSIS — M25662 Stiffness of left knee, not elsewhere classified: Secondary | ICD-10-CM | POA: Diagnosis not present

## 2016-06-13 NOTE — Therapy (Signed)
[facility] [place], [place], [address] Phone: [phone]   Fax:  [phone]  Physical Therapy Treatment  Patient Details  Name: Brett Blankenship MRN: [id number] Date of Birth: [birth year] Referring Provider: Claretta Fraise MD  Encounter Date: 06/13/2016      PT End of Session - 06/13/16 1121    Visit Number 3   Number of Visits 16   Date for PT Re-Evaluation 08/04/16   PT Start Time 1116   PT Stop Time 1201   PT Time Calculation (min) 45 min   Activity Tolerance Patient tolerated treatment well   Behavior During Therapy [facility] for tasks assessed/performed      Past Medical History:  Diagnosis Date  . HYPERTENSION 07/12/2009    Past Surgical History:  Procedure Laterality Date  . APPENDECTOMY  1960  . SPINE SURGERY  2004   fracture cervical spine    There were no vitals filed for this visit.      Subjective Assessment - 06/13/16 1121    Subjective Thinks his knees are getting better and reported good results with iontophoresis patch.   Limitations Walking   How long can you walk comfortably? Community distances.   Patient Stated Goals Get out of pain.   Currently in Pain? Yes   Pain Score --  "a little"   Pain Location Knee   Pain Orientation Left   Pain Descriptors / Indicators Sore   Pain Type Chronic pain   Pain Frequency Intermittent   Aggravating Factors  Certain step during walking            [facility] PT Assessment - 06/13/16 0001      Assessment   Medical Diagnosis Left knee pain.     Precautions   Precautions Fall     Restrictions   Weight Bearing Restrictions No                     [facility] Adult PT Treatment/Exercise - 06/13/16 0001      Knee/Hip Exercises: Aerobic   Nustep L5 x16 min     Knee/Hip Exercises: Machines for Strengthening   Cybex Knee Extension 10# 2x10 reps   Cybex Knee Flexion 30# 3x10 reps     Knee/Hip Exercises: Standing   Heel Raises Both;2  sets;10 reps   Hip Flexion AROM;Both;2 sets;10 reps;Knee bent   Hip Abduction AROM;Both;2 sets;10 reps;Knee straight   Forward Step Up Both;2 sets;10 reps;Hand Hold: 2;Step Height: 6"     Knee/Hip Exercises: Supine   Straight Leg Raises AROM;Both;1 set;15 reps     Iontophoresis   Type of Iontophoresis Dexamethasone   Location L superior gastroc   Dose 1 ml   Time 8                  PT Short Term Goals - 06/05/16 1242      PT SHORT TERM GOAL #1   Title STG's=LTG's.           PT Long Term Goals - 06/05/16 1243      PT LONG TERM GOAL #1   Title Independent with a HEP.   Time 8   Period Weeks   Status New     PT LONG TERM GOAL #2   Title Active left knee flexion to 115 degrees+ so the patient can perform functional tasks and do so with pain not > 2-3/10   Time 8   Period Weeks   Status New  PT LONG TERM GOAL #3   Title Increase left hip strength to 5/5 to increase stability of gait.   Time 8   Period Weeks   Status New     PT LONG TERM GOAL #4   Title Perform a reciprocating stair gait with one railing with pain not > 2-3/10.   Time 8   Period Weeks   Status New               Plan - 06/13/16 1203    Clinical Impression Statement Patient presented in clinic with only very minimal intermittant knee pain. Patient able to be progressed through B knee strengthening with both standing and machinary with no complaints. Patient required minimal to moderate multimodal cueing for proper technique and any corrections. Iontophoresis patch again placed over L superior gastroc per patient request.   Rehab Potential Excellent   PT Frequency 2x / week   PT Duration 8 weeks   PT Treatment/Interventions ADLs/Self Care Home Management;Cryotherapy;Electrical Stimulation;Moist Heat;Ultrasound;Patient/family education;Therapeutic exercise;Therapeutic activities;Manual techniques;Passive range of motion;Vasopneumatic Device   PT Next Visit Plan Modalities and  STW/M to left knee; patellar mobs; will send order for Ionto (please check "other orders" tab).  PAIN-FREE left quad strengthening.  Left hip abduction strengthening.   Consulted and Agree with Plan of Care Patient      Patient will benefit from skilled therapeutic intervention in order to improve the following deficits and impairments:  Pain, Decreased activity tolerance, Decreased strength, Decreased range of motion  Visit Diagnosis: Acute pain of left knee  Stiffness of left knee, not elsewhere classified     Problem List Patient Active Problem List   Diagnosis Date Noted  . Obesity (BMI 30-39.9) 03/29/2013  . Basal cell carcinoma 02/05/2013  . Essential hypertension 07/12/2009    Wynelle Fanny, PTA 06/13/2016, 12:37 PM  [facility] [address] [place], [place], [address] Phone: [phone]   Fax:  [phone]  Name: Brett Blankenship MRN: [id number] Date of Birth: [birth year]

## 2016-06-18 ENCOUNTER — Ambulatory Visit: Payer: Medicare Other | Admitting: Physical Therapy

## 2016-06-18 DIAGNOSIS — M25562 Pain in left knee: Secondary | ICD-10-CM | POA: Diagnosis not present

## 2016-06-18 DIAGNOSIS — M25662 Stiffness of left knee, not elsewhere classified: Secondary | ICD-10-CM | POA: Diagnosis not present

## 2016-06-18 NOTE — Therapy (Signed)
[facility] [place], [place], [address] Phone: [phone]   Fax:  [phone]  Physical Therapy Treatment  Patient Details  Name: Brett Blankenship MRN: [id number] Date of Birth: 1927/02/11 Referring Provider: Claretta Fraise MD  Encounter Date: 06/18/2016      PT End of Session - 06/18/16 1300    Visit Number 4   Number of Visits 16   Date for PT Re-Evaluation 08/04/16   PT Start Time 1118   PT Stop Time 1213   PT Time Calculation (min) 55 min   Activity Tolerance Patient tolerated treatment well   Behavior During Therapy [facility] for tasks assessed/performed      Past Medical History:  Diagnosis Date  . HYPERTENSION 07/12/2009    Past Surgical History:  Procedure Laterality Date  . APPENDECTOMY  1960  . SPINE SURGERY  2004   fracture cervical spine    There were no vitals filed for this visit.      Subjective Assessment - 06/18/16 1258    Subjective The treatments are helping my knee.   Currently in Pain? Yes   Pain Score 4    Pain Location Knee   Pain Orientation Left   Pain Descriptors / Indicators Sore   Pain Type Chronic pain   Pain Onset More than a month ago   Pain Frequency Intermittent     Treatment:  IFC and HMP to patient's left knee f/b calf and left ham stretching; patellar mobs and STW/M especially to posterior knee f/b Iontophoresis at 80 mA-Min to left popliteal fossa region.  Patient tolerated treatment without complaint.                              PT Short Term Goals - 06/05/16 1242      PT SHORT TERM GOAL #1   Title STG's=LTG's.           PT Long Term Goals - 06/05/16 1243      PT LONG TERM GOAL #1   Title Independent with a HEP.   Time 8   Period Weeks   Status New     PT LONG TERM GOAL #2   Title Active left knee flexion to 115 degrees+ so the patient can perform functional tasks and do so with pain not > 2-3/10   Time 8   Period Weeks   Status New     PT LONG TERM GOAL #3   Title Increase left hip strength to 5/5 to increase stability of gait.   Time 8   Period Weeks   Status New     PT LONG TERM GOAL #4   Title Perform a reciprocating stair gait with one railing with pain not > 2-3/10.   Time 8   Period Weeks   Status New             Patient will benefit from skilled therapeutic intervention in order to improve the following deficits and impairments:  Pain, Decreased activity tolerance, Decreased strength, Decreased range of motion  Visit Diagnosis: Acute pain of left knee  Stiffness of left knee, not elsewhere classified     Problem List Patient Active Problem List   Diagnosis Date Noted  . Obesity (BMI 30-39.9) 03/29/2013  . Basal cell carcinoma 02/05/2013  . Essential hypertension 07/12/2009    Pammie Chirino, [place] MPT 06/18/2016, 1:04 PM  [facility] [place], [place],  [place] Phone: [phone]   Fax:  [phone]  Name: Brett Blankenship MRN: [id number] Date of Birth: [birth year]

## 2016-06-20 ENCOUNTER — Ambulatory Visit: Payer: Medicare Other | Admitting: Physical Therapy

## 2016-06-20 DIAGNOSIS — M25562 Pain in left knee: Secondary | ICD-10-CM

## 2016-06-20 DIAGNOSIS — M25662 Stiffness of left knee, not elsewhere classified: Secondary | ICD-10-CM

## 2016-06-20 NOTE — Therapy (Signed)
[facility] [place], [place], [address] Phone: [phone]   Fax:  [phone]  Physical Therapy Treatment  Patient Details  Name: Brett Blankenship MRN: [id number] Date of Birth: 09/10/1926 Referring Provider: Claretta Fraise MD  Encounter Date: 06/20/2016      PT End of Session - 06/20/16 1135    PT Start Time 1030   PT Stop Time 1133   PT Time Calculation (min) 63 min   Activity Tolerance Patient tolerated treatment well   Behavior During Therapy [facility] for tasks assessed/performed      Past Medical History:  Diagnosis Date  . HYPERTENSION 07/12/2009    Past Surgical History:  Procedure Laterality Date  . APPENDECTOMY  1960  . SPINE SURGERY  2004   fracture cervical spine    There were no vitals filed for this visit.      Subjective Assessment - 06/20/16 1116    Subjective These treatments are helping a lot.  I'm doing a lot more around the house.   Pain Score 3    Pain Location Knee   Pain Orientation Left   Pain Descriptors / Indicators Sore   Pain Type Chronic pain   Pain Onset More than a month ago     Treatment:  Nustep level 3 x 15 minutes f/b left knee STW/M x 8 minutes f/n HMP and IFC x 15 minutes.  Ionto with dex 42mA-Min to left post knee.  Excellent progress toward goals.                              PT Short Term Goals - 06/05/16 1242      PT SHORT TERM GOAL #1   Title STG's=LTG's.           PT Long Term Goals - 06/05/16 1243      PT LONG TERM GOAL #1   Title Independent with a HEP.   Time 8   Period Weeks   Status New     PT LONG TERM GOAL #2   Title Active left knee flexion to 115 degrees+ so the patient can perform functional tasks and do so with pain not > 2-3/10   Time 8   Period Weeks   Status New     PT LONG TERM GOAL #3   Title Increase left hip strength to 5/5 to increase stability of gait.   Time 8   Period Weeks   Status New     PT  LONG TERM GOAL #4   Title Perform a reciprocating stair gait with one railing with pain not > 2-3/10.   Time 8   Period Weeks   Status New               Plan - 06/20/16 1122    PT Next Visit Plan Modalities and STW/M to left knee; patellar mobs; will send order for Ionto (please check "other orders" tab).  PAIN-FREE left quad strengthening.  Left hip abduction strengthening.      Patient will benefit from skilled therapeutic intervention in order to improve the following deficits and impairments:  Pain, Decreased activity tolerance, Decreased strength, Decreased range of motion  Visit Diagnosis: Acute pain of left knee  Stiffness of left knee, not elsewhere classified     Problem List Patient Active Problem List   Diagnosis Date Noted  . Obesity (BMI 30-39.9) 03/29/2013  . Basal cell carcinoma 02/05/2013  .  Essential hypertension 07/12/2009    Christell Steinmiller, [place] MPT 06/20/2016, 11:36 AM  [facility] [address] [place], [place], [address] Phone: [phone]   Fax:  [phone]  Name: Brett Blankenship MRN: [id number] Date of Birth: Sep 14, 1926

## 2016-06-25 ENCOUNTER — Encounter: Payer: Self-pay | Admitting: Physical Therapy

## 2016-06-25 ENCOUNTER — Ambulatory Visit: Payer: Medicare Other | Admitting: Physical Therapy

## 2016-06-25 DIAGNOSIS — M25562 Pain in left knee: Secondary | ICD-10-CM | POA: Diagnosis not present

## 2016-06-25 DIAGNOSIS — M25662 Stiffness of left knee, not elsewhere classified: Secondary | ICD-10-CM

## 2016-06-25 NOTE — Therapy (Signed)
[facility] [place], [place], [address] Phone: [phone]   Fax:  [phone]  Physical Therapy Treatment  Patient Details  Name: Brett Blankenship MRN: [id number] Date of Birth: 1926-06-28 Referring Provider: Claretta Fraise MD  Encounter Date: 06/25/2016      PT End of Session - 06/25/16 0959    Visit Number 6   Number of Visits 16   Date for PT Re-Evaluation 08/04/16   PT Start Time 0945   PT Stop Time 1030   PT Time Calculation (min) 45 min   Activity Tolerance Patient tolerated treatment well   Behavior During Therapy [facility] for tasks assessed/performed      Past Medical History:  Diagnosis Date  . HYPERTENSION 07/12/2009    Past Surgical History:  Procedure Laterality Date  . APPENDECTOMY  1960  . SPINE SURGERY  2004   fracture cervical spine    There were no vitals filed for this visit.      Subjective Assessment - 06/25/16 0954    Subjective  Pt reporting he is having more pain in posterior knee. Pt reporting 8/10 pain with movements and stiffness. Pt did however report that he feels therapy has helped.    Limitations Walking;House hold activities   How long can you sit comfortably? 30 minutes with stiffness when I try to stand   How long can you stand comfortably? 20 minutes   How long can you walk comfortably? Community distances, slowly   Patient Stated Goals Stop hurting, walk better   Pain Score 8    Pain Location Knee   Pain Orientation Left   Pain Descriptors / Indicators Aching;Sore   Pain Type Chronic pain   Pain Onset More than a month ago   Aggravating Factors  certain steps during walking            [facility] PT Assessment - 06/25/16 0001      Assessment   Medical Diagnosis Left knee pain.     Precautions   Precautions Fall     Restrictions   Weight Bearing Restrictions No                     [facility] Adult PT Treatment/Exercise - 06/25/16 0001      Exercises   Exercises Knee/Hip     Knee/Hip Exercises: Aerobic   Nustep L4 x 15 minutes     Knee/Hip Exercises: Machines for Strengthening   Cybex Knee Extension 10# 2x10 reps   Cybex Knee Flexion 30# 3x10 reps     Knee/Hip Exercises: Standing   Heel Raises Both;2 sets;10 reps   Hip Flexion AROM;Both;2 sets;10 reps;Knee bent   Hip Abduction AROM;Both;2 sets;10 reps;Knee straight   Forward Step Up Both;2 sets;10 reps;Hand Hold: 2;Step Height: 6"     Knee/Hip Exercises: Supine   Quad Sets --  holding 3-5 seconds   Straight Leg Raises AROM;Both;1 set;15 reps     Modalities   Modalities Electrical Stimulation     Iontophoresis   Type of Iontophoresis Dexamethasone   Location L superior gastroc   Dose 4mg /ml                PT Education - 06/25/16 0958    Education provided Yes   Education Details Importance of knee flexion and functional mobility    Person(s) Educated Patient   Methods Demonstration;Explanation   Comprehension Verbalized understanding;Returned demonstration          PT Short Term Goals -  06/25/16 1001      PT SHORT TERM GOAL #1   Title STG's=LTG's.           PT Long Term Goals - 06/25/16 1001      PT LONG TERM GOAL #1   Title Independent with a HEP.   Time 8   Period Weeks   Status New     PT LONG TERM GOAL #2   Title Active left knee flexion to 115 degrees+ so the patient can perform functional tasks and do so with pain not > 2-3/10   Time 8   Period Weeks   Status New     PT LONG TERM GOAL #3   Title Increase left hip strength to 5/5 to increase stability of gait.   Period Weeks   Status New     PT LONG TERM GOAL #4   Title Perform a reciprocating stair gait with one railing with pain not > 2-3/10.   Time 8   Period Weeks   Status New               Plan - 06/25/16 [id number]    Clinical Impression Statement Pt tolerating therapy well today making progress with functional mobility and knee flexion ROM. Pt also reporting  improvement with ADL's.    Rehab Potential Excellent   PT Frequency 2x / week   PT Duration 8 weeks   PT Treatment/Interventions ADLs/Self Care Home Management;Cryotherapy;Electrical Stimulation;Moist Heat;Ultrasound;Patient/family education;Therapeutic exercise;Therapeutic activities;Manual techniques;Passive range of motion;Vasopneumatic Device   PT Next Visit Plan Modalities and STW/M to left knee; patellar mobs; will send order for Ionto (please check "other orders" tab).  PAIN-FREE left quad strengthening.  Left hip abduction strengthening.   PT Home Exercise Plan Knee flexion, quad sets, sit to stand   Consulted and Agree with Plan of Care Patient      Patient will benefit from skilled therapeutic intervention in order to improve the following deficits and impairments:  Pain, Decreased activity tolerance, Decreased strength, Decreased range of motion  Visit Diagnosis: Acute pain of left knee  Stiffness of left knee, not elsewhere classified     Problem List Patient Active Problem List   Diagnosis Date Noted  . Obesity (BMI 30-39.9) 03/29/2013  . Basal cell carcinoma 02/05/2013  . Essential hypertension 07/12/2009    [name] , MPT 06/25/2016, 10:35 AM  [facility] [address] [place], [place], [address] Phone: [phone]   Fax:  [phone]  Name: COHEN NOVACEK MRN: [id number] Date of Birth: May 14, 1926

## 2016-06-28 ENCOUNTER — Ambulatory Visit: Payer: Medicare Other | Attending: Family Medicine | Admitting: *Deleted

## 2016-06-28 DIAGNOSIS — M25562 Pain in left knee: Secondary | ICD-10-CM | POA: Diagnosis not present

## 2016-06-28 DIAGNOSIS — M25662 Stiffness of left knee, not elsewhere classified: Secondary | ICD-10-CM | POA: Insufficient documentation

## 2016-06-28 NOTE — Therapy (Signed)
[facility] [place], [place], [address] Phone: [phone]   Fax:  [phone]  Physical Therapy Treatment  Patient Details  Name: Brett Blankenship MRN: [id number] Date of Birth: [birth year] Referring Provider: Claretta Fraise MD  Encounter Date: 06/28/2016      PT End of Session - 06/28/16 1122    Visit Number 7   Number of Visits 16   Date for PT Re-Evaluation 08/04/16   PT Start Time 1115   PT Stop Time 1208   PT Time Calculation (min) 53 min      Past Medical History:  Diagnosis Date  . HYPERTENSION 07/12/2009    Past Surgical History:  Procedure Laterality Date  . APPENDECTOMY  1960  . SPINE SURGERY  2004   fracture cervical spine    There were no vitals filed for this visit.      Subjective Assessment - 06/28/16 1118    Subjective  Pt reporting he is having more pain in posterior knee. Pt reporting 8/10 pain with movements and stiffness. Pt did however report that he feels therapy has helped.    Patient is accompained by: Family member   Limitations Walking;House hold activities   How long can you sit comfortably? 30 minutes with stiffness when I try to stand   How long can you stand comfortably? 20 minutes   How long can you walk comfortably? Community distances, slowly   Patient Stated Goals Stop hurting, walk better   Currently in Pain? Yes   Pain Score 6    Pain Orientation Left   Pain Descriptors / Indicators Aching;Sore   Pain Type Chronic pain   Pain Onset More than a month ago   Pain Frequency Intermittent                         [facility] Adult PT Treatment/Exercise - 06/28/16 0001      Exercises   Exercises Knee/Hip     Knee/Hip Exercises: Aerobic   Nustep L4 x 15 minutes     Knee/Hip Exercises: Machines for Strengthening   Cybex Knee Extension 10# 3 x10 reps   Cybex Knee Flexion 30# 3x15 reps     Knee/Hip Exercises: Standing   Heel Raises Both;2 sets;10 reps   Hip  Flexion AROM;Both;2 sets;10 reps;Knee bent   Hip Abduction --   Forward Step Up Both;10 reps;Hand Hold: 2;Step Height: 6";3 sets     Knee/Hip Exercises: Seated   Sit to Sand 2 sets;10 reps     Iontophoresis   Type of Iontophoresis Dexamethasone   Location L superior gastroc   Dose 4mg /ml   Time 8                  PT Short Term Goals - 06/25/16 1001      PT SHORT TERM GOAL #1   Title STG's=LTG's.           PT Long Term Goals - 06/28/16 1154      PT LONG TERM GOAL #1   Title Independent with a HEP.   Time 8   Period Weeks   Status On-going     PT LONG TERM GOAL #2   Title Active left knee flexion to 115 degrees+ so the patient can perform functional tasks and do so with pain not > 2-3/10   Time 8   Period Weeks   Status On-going     PT LONG TERM GOAL #3  Title Increase left hip strength to 5/5 to increase stability of gait.   Time 8   Period Weeks   Status On-going     PT LONG TERM GOAL #4   Title Perform a reciprocating stair gait with one railing with pain not > 2-3/10.   Time 8   Period Weeks   Status Achieved               Plan - 06/28/16 1209    Clinical Impression Statement Pt did great with Rx today and was able to meet LTG for stair negotiating and was close to meeting LTG for flexion ROM. He had 112 degrees today. Other LTGs are ongoing   Rehab Potential Excellent   PT Frequency 2x / week   PT Duration 8 weeks   PT Treatment/Interventions ADLs/Self Care Home Management;Cryotherapy;Electrical Stimulation;Moist Heat;Ultrasound;Patient/family education;Therapeutic exercise;Therapeutic activities;Manual techniques;Passive range of motion;Vasopneumatic Device   PT Next Visit Plan Modalities and STW/M to left knee; patellar mobs; will send order for Ionto (please check "other orders" tab).  PAIN-FREE left quad strengthening.  Left hip abduction strengthening.   PT Home Exercise Plan Knee flexion, quad sets, sit to stand   Consulted and  Agree with Plan of Care Patient      Patient will benefit from skilled therapeutic intervention in order to improve the following deficits and impairments:  Pain, Decreased activity tolerance, Decreased strength, Decreased range of motion  Visit Diagnosis: Acute pain of left knee  Stiffness of left knee, not elsewhere classified     Problem List Patient Active Problem List   Diagnosis Date Noted  . Obesity (BMI 30-39.9) 03/29/2013  . Basal cell carcinoma 02/05/2013  . Essential hypertension 07/12/2009    Tamiah Dysart,CHRIS, PTA 06/28/2016, 12:12 PM  [facility] [address] [place], [place], [address] Phone: [phone]   Fax:  [phone]  Name: Brett Blankenship MRN: [id number] Date of Birth: Feb 21, 1927

## 2016-07-02 ENCOUNTER — Ambulatory Visit: Payer: Medicare Other | Admitting: Physical Therapy

## 2016-07-02 DIAGNOSIS — M25662 Stiffness of left knee, not elsewhere classified: Secondary | ICD-10-CM

## 2016-07-02 DIAGNOSIS — M25562 Pain in left knee: Secondary | ICD-10-CM

## 2016-07-02 NOTE — Therapy (Signed)
[facility] [place], [place], [address] Phone: [phone]   Fax:  [phone]  Physical Therapy Treatment  Patient Details  Name: Brett Blankenship MRN: [id number] Date of Birth: 12-06-1926 Referring Provider: Claretta Fraise MD  Encounter Date: 07/02/2016      PT End of Session - 07/02/16 1632    PT Start Time 0315   PT Stop Time 0407   PT Time Calculation (min) 52 min   Activity Tolerance Patient tolerated treatment well   Behavior During Therapy [facility] for tasks assessed/performed      Past Medical History:  Diagnosis Date  . HYPERTENSION 07/12/2009    Past Surgical History:  Procedure Laterality Date  . APPENDECTOMY  1960  . SPINE SURGERY  2004   fracture cervical spine    There were no vitals filed for this visit.      Subjective Assessment - 07/02/16 1623    Subjective I used a chainsaw yesterday.  This therapy has really helped me a lot.                         [facility] Adult PT Treatment/Exercise - 07/02/16 0001      Exercises   Exercises Knee/Hip     Knee/Hip Exercises: Aerobic   Nustep Level 4 x 15 minutes.     Knee/Hip Exercises: Machines for Strengthening   Cybex Knee Extension 10# x 4 minutes.   Cybex Knee Flexion 30# x 4 minutes.     Modalities   Modalities Electrical Stimulation;Moist Hotel manager Location Left post knee/gastroc heads.   Electrical Stimulation Action IFC   Electrical Stimulation Parameters Constant 80-150 Hz x 20 minutes.   Electrical Stimulation Goals Pain                  PT Short Term Goals - 06/25/16 1001      PT SHORT TERM GOAL #1   Title STG's=LTG's.           PT Long Term Goals - 06/28/16 1154      PT LONG TERM GOAL #1   Title Independent with a HEP.   Time 8   Period Weeks   Status On-going     PT LONG TERM GOAL #2   Title Active left knee flexion to 115 degrees+ so the  patient can perform functional tasks and do so with pain not > 2-3/10   Time 8   Period Weeks   Status On-going     PT LONG TERM GOAL #3   Title Increase left hip strength to 5/5 to increase stability of gait.   Time 8   Period Weeks   Status On-going     PT LONG TERM GOAL #4   Title Perform a reciprocating stair gait with one railing with pain not > 2-3/10.   Time 8   Period Weeks   Status Achieved             Patient will benefit from skilled therapeutic intervention in order to improve the following deficits and impairments:  Pain, Decreased activity tolerance, Decreased strength, Decreased range of motion  Visit Diagnosis: Acute pain of left knee  Stiffness of left knee, not elsewhere classified     Problem List Patient Active Problem List   Diagnosis Date Noted  . Obesity (BMI 30-39.9) 03/29/2013  . Basal cell carcinoma 02/05/2013  . Essential hypertension 07/12/2009  Kyona Chauncey, [place] 07/02/2016, 4:38 PM  [facility] [place], [place], [address] Phone: [phone]   Fax:  [phone]  Name: Brett Blankenship MRN: [id number] Date of Birth: 27-Dec-1926

## 2016-07-04 ENCOUNTER — Ambulatory Visit: Payer: Medicare Other | Admitting: Physical Therapy

## 2016-07-04 DIAGNOSIS — M25662 Stiffness of left knee, not elsewhere classified: Secondary | ICD-10-CM

## 2016-07-04 DIAGNOSIS — M25562 Pain in left knee: Secondary | ICD-10-CM

## 2016-07-04 NOTE — Therapy (Signed)
[facility] [place], [place], [address] Phone: [phone]   Fax:  [phone]  Physical Therapy Treatment  Patient Details  Name: Brett Blankenship MRN: [id number] Date of Birth: 02-04-1927 Referring Provider: Claretta Fraise MD  Encounter Date: 07/04/2016      PT End of Session - 07/04/16 1253    Visit Number 9   Number of Visits 16   Date for PT Re-Evaluation 08/04/16   PT Start Time 1115   PT Stop Time 9833   PT Time Calculation (min) 49 min      Past Medical History:  Diagnosis Date  . HYPERTENSION 07/12/2009    Past Surgical History:  Procedure Laterality Date  . APPENDECTOMY  1960  . SPINE SURGERY  2004   fracture cervical spine    There were no vitals filed for this visit.      Subjective Assessment - 07/04/16 1254    Subjective Hauled in wood yesterday.  Doing much better.   Pain Score 4    Pain Location Knee   Pain Orientation Left   Pain Descriptors / Indicators Aching   Pain Onset More than a month ago    Treatment:  Nustep level 4 x 23 minutes f/b e'stim x 20 minutes to affected left knee.  Excellent progress.                               PT Short Term Goals - 06/25/16 1001      PT SHORT TERM GOAL #1   Title STG's=LTG's.           PT Long Term Goals - 07/04/16 1259      PT LONG TERM GOAL #1   Title Independent with a HEP.   Time 8   Period Weeks   Status On-going     PT LONG TERM GOAL #2   Title Active left knee flexion to 115 degrees+ so the patient can perform functional tasks and do so with pain not > 2-3/10   Time 8   Period Weeks   Status On-going     PT LONG TERM GOAL #3   Title Increase left hip strength to 5/5 to increase stability of gait.   Time 8   Period Weeks   Status On-going     PT LONG TERM GOAL #4   Title Perform a reciprocating stair gait with one railing with pain not > 2-3/10.   Time 8   Period Weeks   Status New                Plan - 07/04/16 1256    Clinical Impression Statement Patient making excellent progress toward goals.      Patient will benefit from skilled therapeutic intervention in order to improve the following deficits and impairments:     Visit Diagnosis: Acute pain of left knee  Stiffness of left knee, not elsewhere classified     Problem List Patient Active Problem List   Diagnosis Date Noted  . Obesity (BMI 30-39.9) 03/29/2013  . Basal cell carcinoma 02/05/2013  . Essential hypertension 07/12/2009    Shawan Corella, [place] MPT 07/04/2016, 1:00 PM  [facility] [address] [place], [place], [address] Phone: [phone]   Fax:  [phone]  Name: AMRITPAL SHROPSHIRE MRN: [id number] Date of Birth: 1927/01/27

## 2016-07-08 ENCOUNTER — Ambulatory Visit: Payer: Medicare Other | Admitting: Family Medicine

## 2016-07-09 ENCOUNTER — Encounter: Payer: Self-pay | Admitting: Physical Therapy

## 2016-07-09 ENCOUNTER — Ambulatory Visit: Payer: Medicare Other | Admitting: Physical Therapy

## 2016-07-09 DIAGNOSIS — M25562 Pain in left knee: Secondary | ICD-10-CM

## 2016-07-09 DIAGNOSIS — M25662 Stiffness of left knee, not elsewhere classified: Secondary | ICD-10-CM

## 2016-07-09 NOTE — Therapy (Signed)
[facility] [place], [place], [address] Phone: [phone]   Fax:  [phone]  Physical Therapy Treatment  Patient Details  Name: Brett Blankenship MRN: [id number] Date of Birth: Jul 19, 1926 Referring Provider: Claretta Fraise MD  Encounter Date: 07/09/2016      PT End of Session - 07/09/16 1059    Visit Number 10   Number of Visits 16   Date for PT Re-Evaluation 08/04/16   PT Start Time 1056   PT Stop Time 1149   PT Time Calculation (min) 53 min   Activity Tolerance Patient tolerated treatment well   Behavior During Therapy [facility] for tasks assessed/performed      Past Medical History:  Diagnosis Date  . HYPERTENSION 07/12/2009    Past Surgical History:  Procedure Laterality Date  . APPENDECTOMY  1960  . SPINE SURGERY  2004   fracture cervical spine    There were no vitals filed for this visit.      Subjective Assessment - 07/09/16 1057    Subjective Reports that he still has some discomfort in the back of his knee but nothing like when he started PT. Reports that he had a slack day yesterday due to snow.   Limitations Walking;House hold activities   How long can you sit comfortably? 30 minutes with stiffness when I try to stand   How long can you stand comfortably? 20 minutes   How long can you walk comfortably? Community distances, slowly   Patient Stated Goals Stop hurting, walk better   Currently in Pain? Yes   Pain Score --  Reports that pain is nothing like it used to be but did not provide numerical pain rating   Pain Location Knee   Pain Orientation Left;Posterior   Pain Descriptors / Indicators Discomfort   Pain Type Chronic pain   Pain Onset More than a month ago            [facility] PT Assessment - 07/09/16 0001      Assessment   Medical Diagnosis Left knee pain.     Precautions   Precautions Fall     Restrictions   Weight Bearing Restrictions No                      [facility] Adult PT Treatment/Exercise - 07/09/16 0001      Knee/Hip Exercises: Aerobic   Nustep L6 x15 min     Knee/Hip Exercises: Machines for Strengthening   Cybex Knee Extension 10# 3x10 reps   Cybex Knee Flexion 30# 3x10 reps     Knee/Hip Exercises: Standing   Heel Raises Both;2 sets;10 reps   Forward Step Up Both;2 sets;10 reps;Hand Hold: 2;Step Height: 6"   Rocker Board 3 minutes     Knee/Hip Exercises: Supine   Straight Leg Raises AROM;Left;2 sets;10 reps   Straight Leg Raise with External Rotation AROM;Left;1 set;15 reps     Knee/Hip Exercises: Sidelying   Hip ABduction AROM;Left;2 sets;10 reps     Modalities   Modalities Passenger transport manager Location L superior Nurse, learning disability Pre-Mod   Electrical Stimulation Parameters 80-150 hz x15 min   Electrical Stimulation Goals Pain     Vasopneumatic   Number Minutes Vasopneumatic  15 minutes   Vasopnuematic Location  Knee   Vasopneumatic Pressure Medium   Vasopneumatic Temperature  50  PT Short Term Goals - 06/25/16 1001      PT SHORT TERM GOAL #1   Title STG's=LTG's.           PT Long Term Goals - 07/09/16 1105      PT LONG TERM GOAL #1   Title Independent with a HEP.   Time 8   Period Weeks   Status Achieved     PT LONG TERM GOAL #2   Title Active left knee flexion to 115 degrees+ so the patient can perform functional tasks and do so with pain not > 2-3/10   Time 8   Period Weeks   Status Achieved  120 deg L knee flexion 07/09/2016     PT LONG TERM GOAL #3   Title Increase left hip strength to 5/5 to increase stability of gait.   Time 8   Period Weeks   Status On-going     PT LONG TERM GOAL #4   Title Perform a reciprocating stair gait with one railing with pain not > 2-3/10.   Time 8   Period Weeks   Status On-going               Plan - 07/09/16 1137    Clinical  Impression Statement Patient tolerated today's treatment well with guidance through LLE strengthening. Patient had no reports of any L knee pain during treatment only reports of fatigue in LLE especially with supine and SL hip abduction exercises. AROM of L knee measured as 120 deg in supine. Remaining on-going goals pertain to reciprical stair gait and L hip strengthening which is stil focus of treatments. Normal modalities response noted following removal of the modalities.   Rehab Potential Excellent   PT Frequency 2x / week   PT Duration 8 weeks   PT Treatment/Interventions ADLs/Self Care Home Management;Cryotherapy;Electrical Stimulation;Moist Heat;Ultrasound;Patient/family education;Therapeutic exercise;Therapeutic activities;Manual techniques;Passive range of motion;Vasopneumatic Device   PT Next Visit Plan Continue with L hip/knee strengthening with modalities PRN per MPT POC.   PT Home Exercise Plan Knee flexion, quad sets, sit to stand   Consulted and Agree with Plan of Care Patient      Patient will benefit from skilled therapeutic intervention in order to improve the following deficits and impairments:  Pain, Decreased activity tolerance, Decreased strength, Decreased range of motion  Visit Diagnosis: Acute pain of left knee  Stiffness of left knee, not elsewhere classified     Problem List Patient Active Problem List   Diagnosis Date Noted  . Obesity (BMI 30-39.9) 03/29/2013  . Basal cell carcinoma 02/05/2013  . Essential hypertension 07/12/2009    Wynelle Fanny, PTA 07/09/2016, 11:52 AM  [facility] [place], [place], [address] Phone: [phone]   Fax:  [phone]  Name: Brett Blankenship MRN: [id number] Date of Birth: Aug 28, 1926

## 2016-07-10 ENCOUNTER — Encounter: Payer: Self-pay | Admitting: Family Medicine

## 2016-07-10 ENCOUNTER — Ambulatory Visit (INDEPENDENT_AMBULATORY_CARE_PROVIDER_SITE_OTHER): Payer: Medicare Other | Admitting: Family Medicine

## 2016-07-10 VITALS — BP 137/54 | HR 55 | Temp 98.2°F | Ht 68.5 in | Wt 201.0 lb

## 2016-07-10 DIAGNOSIS — R600 Localized edema: Secondary | ICD-10-CM

## 2016-07-10 DIAGNOSIS — M25562 Pain in left knee: Secondary | ICD-10-CM

## 2016-07-10 DIAGNOSIS — G8929 Other chronic pain: Secondary | ICD-10-CM

## 2016-07-10 DIAGNOSIS — I10 Essential (primary) hypertension: Secondary | ICD-10-CM

## 2016-07-10 MED ORDER — TRIAMTERENE-HCTZ 37.5-25 MG PO TABS: 1.0000 | ORAL_TABLET | Freq: Every day | ORAL | 3 refills | Status: DC

## 2016-07-10 NOTE — Progress Notes (Signed)
Subjective:  Patient ID: Brett Blankenship, male    DOB: [birth year]  Age: 83 y.o. MRN: [id number]  CC: Follow-up (pt here today following up on his leg pain from arthritis. The diclofenac is helping out a lot.)   HPI MOODY ROBBEN presents for Pain in the knee radiating from the posterolateral aspect. There is pain noted into the mid thigh and down the upper shin.   follow-up of hypertension. Patient has no history of headache chest pain or shortness of breath or recent cough. Patient also denies symptoms of TIA such as numbness weakness lateralizing. Patient checks  blood pressure at home and has not had any elevated readings recently. Patient denies side effects from his medication. States taking it regularly.   History Briscoe has a past medical history of HYPERTENSION (07/12/2009).   He has a past surgical history that includes Appendectomy (8657) and Spine surgery (2004).   His family history includes Cancer in his brother; Hypertension in his father.He reports that he quit smoking about 52 years ago. His smoking use included Cigarettes. He has a 1.50 pack-year smoking history. He has never used smokeless tobacco. His alcohol and drug histories are not on file.  Current Outpatient Prescriptions on File Prior to Visit  Medication Sig Dispense Refill  . amLODipine (NORVASC) 10 MG tablet TAKE ONE TABLET BY MOUTH ONCE DAILY. 90 tablet 1  . diclofenac (VOLTAREN) 75 MG EC tablet Take 1 tablet (75 mg total) by mouth 2 (two) times daily. For muscle and  Joint pain 60 tablet 2  . fish oil-omega-3 fatty acids 1000 MG capsule Take 2 g by mouth daily.      . hydrochlorothiazide (MICROZIDE) 12.5 MG capsule TAKE ONE CAPSULE DAILY. 90 capsule 1  . Multiple Vitamins-Minerals (EYE VITAMINS & MINERALS PO) Take by mouth. [employer]    . potassium chloride SA (K-DUR,KLOR-CON) 20 MEQ tablet TAKE ONE TABLET BY MOUTH ONCE DAILY. 30 tablet 5  . prednisoLONE acetate (PRED FORTE) 1 % ophthalmic suspension  Place 1 drop into the left eye 4 (four) times daily.     . Psyllium 30.9 % POWD Drink 1 tablespoon dissolved in water, Daily 1 Bottle 11   No current facility-administered medications on file prior to visit.     ROS Review of Systems  Constitutional: Negative for chills, diaphoresis, fever and unexpected weight change.  HENT: Negative for congestion, hearing loss, rhinorrhea and sore throat.   Eyes: Negative for visual disturbance.  Respiratory: Negative for cough and shortness of breath.   Cardiovascular: Negative for chest pain.  Gastrointestinal: Positive for diarrhea (intermittent. Loose with increased no. recuring randomly 3-5 days a month.). Negative for abdominal pain and constipation.  Genitourinary: Negative for dysuria and flank pain.  Musculoskeletal: Positive for arthralgias. Negative for joint swelling.  Skin: Negative for rash.  Neurological: Negative for dizziness and headaches.  Psychiatric/Behavioral: Negative for dysphoric mood and sleep disturbance.    Objective:  BP (!) 137/54   Pulse (!) 55   Temp 98.2 F (36.8 C) (Oral)   Ht 5' 8.5" (1.74 m)   Wt 201 lb (91.2 kg)   BMI 30.12 kg/m   Physical Exam  Constitutional: He is oriented to person, place, and time. He appears well-developed and well-nourished. No distress.  HENT:  Head: Normocephalic and atraumatic.  Right Ear: External ear normal.  Left Ear: External ear normal.  Nose: Nose normal.  Mouth/Throat: Oropharynx is clear and moist.  Eyes: Conjunctivae and EOM are normal. Pupils are equal, round,  and reactive to light.  Neck: Normal range of motion. Neck supple. No thyromegaly present.  Cardiovascular: Normal rate, regular rhythm, S1 normal and S2 normal.   Murmur heard.  Crescendo systolic murmur is present with a grade of 2/6   No diastolic murmur is present  Pulses:      Carotid pulses are 2+ on the right side, and 2+ on the left side.      Dorsalis pedis pulses are 2+ on the right side, and 2+  on the left side.  Pulmonary/Chest: Effort normal and breath sounds normal. No respiratory distress. He has no wheezes. He has no rales.  Abdominal: Soft. Bowel sounds are normal. He exhibits no distension. There is no tenderness.  Musculoskeletal:       Left knee: He exhibits swelling, effusion, bony tenderness and MCL laxity. He exhibits no erythema. Tenderness found. Lateral joint line tenderness noted. No patellar tendon tenderness noted.  Lymphadenopathy:    He has no cervical adenopathy.  Neurological: He is alert and oriented to person, place, and time. He has normal reflexes.  Skin: Skin is warm and dry.  Psychiatric: He has a normal mood and affect. His behavior is normal. Judgment and thought content normal.    Assessment & Plan:   Deshone was seen today for follow-up.  Diagnoses and all orders for this visit:  Essential hypertension -     CMP14+EGFR  Chronic pain of left knee  Edema of leg -     CMP14+EGFR  Other orders -     triamterene-hydrochlorothiazide (MAXZIDE-25) 37.5-25 MG tablet; Take 1 tablet by mouth daily. For blood pressure and fluid   I am having Mr. Feldner start on triamterene-hydrochlorothiazide. I am also having him maintain his fish oil-omega-3 fatty acids, prednisoLONE acetate, potassium chloride SA, amLODipine, hydrochlorothiazide, Multiple Vitamins-Minerals (EYE VITAMINS & MINERALS PO), Psyllium, and diclofenac.  Meds ordered this encounter  Medications  . triamterene-hydrochlorothiazide (MAXZIDE-25) 37.5-25 MG tablet    Sig: Take 1 tablet by mouth daily. For blood pressure and fluid    Dispense:  90 tablet    Refill:  3   Allergies as of 07/10/2016   No Known Allergies     Medication List       Accurate as of 07/10/16 11:59 PM. Always use your most recent med list.          amLODipine 10 MG tablet Commonly known as:  NORVASC TAKE ONE TABLET BY MOUTH ONCE DAILY.   diclofenac 75 MG EC tablet Commonly known as:  VOLTAREN Take 1  tablet (75 mg total) by mouth 2 (two) times daily. For muscle and  Joint pain   EYE VITAMINS & MINERALS PO Take by mouth. [employer]   fish oil-omega-3 fatty acids 1000 MG capsule Take 2 g by mouth daily.   hydrochlorothiazide 12.5 MG capsule Commonly known as:  MICROZIDE TAKE ONE CAPSULE DAILY.   potassium chloride SA 20 MEQ tablet Commonly known as:  K-DUR,KLOR-CON TAKE ONE TABLET BY MOUTH ONCE DAILY.   prednisoLONE acetate 1 % ophthalmic suspension Commonly known as:  PRED FORTE Place 1 drop into the left eye 4 (four) times daily.   Psyllium 30.9 % Powd Drink 1 tablespoon dissolved in water, Daily   triamterene-hydrochlorothiazide 37.5-25 MG tablet Commonly known as:  MAXZIDE-25 Take 1 tablet by mouth daily. For blood pressure and fluid        Follow-up: Return in about 6 months (around 01/10/2017).  Claretta Fraise, M.D.

## 2016-07-11 ENCOUNTER — Ambulatory Visit: Payer: Medicare Other | Admitting: Physical Therapy

## 2016-07-11 ENCOUNTER — Encounter: Payer: Self-pay | Admitting: Physical Therapy

## 2016-07-11 DIAGNOSIS — M25662 Stiffness of left knee, not elsewhere classified: Secondary | ICD-10-CM

## 2016-07-11 DIAGNOSIS — M25562 Pain in left knee: Secondary | ICD-10-CM

## 2016-07-11 LAB — CMP14+EGFR
A/G RATIO: 1.1 — AB (ref 1.2–2.2)
ALBUMIN: 3.7 g/dL (ref 3.5–4.7)
ALT: 11 IU/L (ref 0–44)
AST: 14 IU/L (ref 0–40)
Alkaline Phosphatase: 55 IU/L (ref 39–117)
BUN/Creatinine Ratio: 22 (ref 10–24)
BUN: 17 mg/dL (ref 8–27)
Bilirubin Total: 0.2 mg/dL (ref 0.0–1.2)
CALCIUM: 9.3 mg/dL (ref 8.6–10.2)
CO2: 27 mmol/L (ref 18–29)
CREATININE: 0.79 mg/dL (ref 0.76–1.27)
Chloride: 101 mmol/L (ref 96–106)
GFR, EST AFRICAN AMERICAN: 93 mL/min/{1.73_m2} (ref >59)
GFR, EST NON AFRICAN AMERICAN: 81 mL/min/{1.73_m2} (ref >59)
GLOBULIN, TOTAL: 3.3 g/dL (ref 1.5–4.5)
Glucose: 112 mg/dL — ABNORMAL HIGH (ref 65–99)
POTASSIUM: 3.7 mmol/L (ref 3.5–5.2)
SODIUM: 140 mmol/L (ref 134–144)
TOTAL PROTEIN: 7 g/dL (ref 6.0–8.5)

## 2016-07-11 NOTE — Therapy (Signed)
[facility] [place], [place], [address] Phone: [phone]   Fax:  [phone]  Physical Therapy Treatment  Patient Details  Name: Brett Blankenship MRN: [id number] Date of Birth: 1926/08/16 Referring Provider: Claretta Fraise MD  Encounter Date: 07/11/2016      PT End of Session - 07/11/16 1143    Visit Number 11   Number of Visits 16   Date for PT Re-Evaluation 08/04/16   PT Start Time 1115   PT Stop Time 1200   PT Time Calculation (min) 45 min   Activity Tolerance Patient tolerated treatment well   Behavior During Therapy [facility] for tasks assessed/performed      Past Medical History:  Diagnosis Date  . HYPERTENSION 07/12/2009    Past Surgical History:  Procedure Laterality Date  . APPENDECTOMY  1960  . SPINE SURGERY  2004   fracture cervical spine    There were no vitals filed for this visit.          [facility] PT Assessment - 07/11/16 0001      Assessment   Medical Diagnosis left knee pain     Precautions   Precautions Fall     Restrictions   Weight Bearing Restrictions No                     [facility] Adult PT Treatment/Exercise - 07/11/16 0001      Exercises   Exercises Knee/Hip     Knee/Hip Exercises: Aerobic   Nustep L6 x 15 minutes     Knee/Hip Exercises: Machines for Strengthening   Cybex Knee Extension 10# 3x10 reps   Cybex Knee Flexion 40# 3 x 10 reps   Cybex Leg Press 3 plates 3 x 10 reps     Knee/Hip Exercises: Standing   Heel Raises Both;2 sets;10 reps     Modalities   Modalities Electrical Stimulation;Moist Heat;Vasopneumatic     Moist Heat Therapy   Number Minutes Moist Heat 15 Minutes   Moist Heat Location Knee  pt requesting heat today     Electrical Stimulation   Electrical Stimulation Location L superior gastroc   Electrical Stimulation Action IFC   Electrical Stimulation Parameters 80-150 Hz x 15 minutes, intensity to patients tolerance   Electrical  Stimulation Goals Pain                PT Education - 07/11/16 1142    Education provided Yes   Education Details Flexion exercises reviewed   Person(s) Educated Patient   Methods Explanation;Demonstration   Comprehension Verbalized understanding;Returned demonstration          PT Short Term Goals - 06/25/16 1001      PT SHORT TERM GOAL #1   Title STG's=LTG's.           PT Long Term Goals - 07/11/16 1150      PT LONG TERM GOAL #1   Time 8   Period Weeks   Status Achieved     PT LONG TERM GOAL #2   Title Active left knee flexion to 115 degrees+ so the patient can perform functional tasks and do so with pain not > 2-3/10   Time 8   Period Weeks   Status Achieved     PT LONG TERM GOAL #3   Title Increase left hip strength to 5/5 to increase stability of gait.   Baseline 4/5 grossly   Period Weeks   Status On-going     PT LONG  TERM GOAL #4   Title Perform a reciprocating stair gait with one railing with pain not > 2-3/10.   Time 8   Period Weeks   Status On-going               Plan - 07/11/16 1147    Clinical Impression Statement Patient tolerated treatment today well with verbal instructions for form and setting up gym equipment. Pt reporting L knee pain today of 4/10 upon arrival and 2/10 at end of session. Pt reporting more hamstring tenderness with palpation. Pt able to try leg press today with 3 plates. AROM of left knee flexion 120 degrees in supine. Skilled PT to continue to porgress pt' toward PLOF and goals set.    Rehab Potential Excellent   PT Frequency 2x / week   PT Duration 8 weeks   PT Treatment/Interventions ADLs/Self Care Home Management;Cryotherapy;Electrical Stimulation;Moist Heat;Ultrasound;Patient/family education;Therapeutic exercise;Therapeutic activities;Manual techniques;Passive range of motion;Vasopneumatic Device   PT Next Visit Plan Continue with L hip/knee strengthening with modalities PRN per MPT POC, Continue with  standing LE strengthening   PT Home Exercise Plan Knee flexion, quad sets, sit to stand, standing LE strengthening exercises 4 way hip and calf raises   Consulted and Agree with Plan of Care Patient      Patient will benefit from skilled therapeutic intervention in order to improve the following deficits and impairments:  Pain, Decreased activity tolerance, Decreased strength, Decreased range of motion  Visit Diagnosis: Acute pain of left knee  Stiffness of left knee, not elsewhere classified     Problem List Patient Active Problem List   Diagnosis Date Noted  . Obesity (BMI 30-39.9) 03/29/2013  . Basal cell carcinoma 02/05/2013  . Essential hypertension 07/12/2009    Brett Blankenship, MPT  07/11/2016, 12:09 PM  [facility] [place], [place], [address] Phone: [phone]   Fax:  [phone]  Name: Brett Blankenship MRN: [id number] Date of Birth: 25-Aug-1926

## 2016-07-16 ENCOUNTER — Ambulatory Visit: Payer: Medicare Other | Admitting: Physical Therapy

## 2016-07-16 ENCOUNTER — Encounter: Payer: Self-pay | Admitting: Physical Therapy

## 2016-07-16 DIAGNOSIS — M25662 Stiffness of left knee, not elsewhere classified: Secondary | ICD-10-CM

## 2016-07-16 DIAGNOSIS — M25562 Pain in left knee: Secondary | ICD-10-CM | POA: Diagnosis not present

## 2016-07-16 NOTE — Therapy (Signed)
[facility] [place], [place], [address] Phone: [phone]   Fax:  [phone]  Physical Therapy Treatment  Patient Details  Name: Brett Blankenship MRN: [id number] Date of Birth: 1927/02/10 Referring Provider: Claretta Fraise MD  Encounter Date: 07/16/2016      PT End of Session - 07/16/16 1208    Visit Number 12   Number of Visits 16   Date for PT Re-Evaluation 08/04/16   PT Start Time 1115   PT Stop Time 1205   PT Time Calculation (min) 50 min   Activity Tolerance Patient tolerated treatment well   Behavior During Therapy [facility] for tasks assessed/performed      Past Medical History:  Diagnosis Date  . HYPERTENSION 07/12/2009    Past Surgical History:  Procedure Laterality Date  . APPENDECTOMY  1960  . SPINE SURGERY  2004   fracture cervical spine    There were no vitals filed for this visit.      Subjective Assessment - 07/16/16 1141    Subjective Patient reports  that he was outside loading wood yesterday. Pt reporting 5/10 pain upon arriving to therapy.    Patient is accompained by: Family member   Limitations Walking;House hold activities   How long can you sit comfortably? 30 minutes with stiffness when I try to stand   How long can you stand comfortably? 20 minutes   How long can you walk comfortably? Community distances, slowly   Patient Stated Goals Stop hurting, walk better   Currently in Pain? Yes   Pain Score 5    Pain Location Knee   Pain Orientation Left   Pain Descriptors / Indicators Aching;Sore   Pain Type Chronic pain   Pain Onset More than a month ago   Pain Frequency Intermittent   Aggravating Factors  bending, certain steps when walking   Pain Relieving Factors resting   Effect of Pain on Daily Activities walking long periods             [facility] PT Assessment - 07/16/16 0001      AROM   Overall AROM Comments L knee: flexion 120 degrees, extension: -10 degrees                      [facility] Adult PT Treatment/Exercise - 07/16/16 0001      Knee/Hip Exercises: Aerobic   Nustep L6 x 15 minutes     Knee/Hip Exercises: Machines for Strengthening   Cybex Knee Extension 20# 3x10 reps   Cybex Knee Flexion 40# 3 x 10 reps   Cybex Leg Press 4 plates 3 x 10 reps     Knee/Hip Exercises: Standing   Forward Step Up Both;10 reps;Hand Hold: 2;Step Height: 6"   Rocker Board 3 minutes     Knee/Hip Exercises: Seated   Sit to Sand 5 reps;with UE support     Knee/Hip Exercises: Supine   Straight Leg Raises Left;5 reps     Modalities   Modalities Electrical Stimulation;Moist Heat;Vasopneumatic     Moist Heat Therapy   Number Minutes Moist Heat 15 Minutes   Moist Heat Location Knee     Electrical Stimulation   Electrical Stimulation Location L superior gastroc   Electrical Stimulation Action IFC   Electrical Stimulation Parameters 0 Hz x 15 minutes, intensity to pt's tolerance   Electrical Stimulation Goals Pain                PT Education - 07/16/16 1207  Education Details Safety precautions for balance discussed with pt   Person(s) Educated Patient   Methods Explanation   Comprehension Verbalized understanding          PT Short Term Goals - 06/25/16 1001      PT SHORT TERM GOAL #1   Title STG's=LTG's.           PT Long Term Goals - 07/16/16 1211      PT LONG TERM GOAL #1   Title Independent with a HEP.   Status Achieved     PT LONG TERM GOAL #2   Title Active left knee flexion to 115 degrees+ so the patient can perform functional tasks and do so with pain not > 2-3/10   Time 8   Status Achieved     PT LONG TERM GOAL #3   Title Increase left hip strength to 5/5 to increase stability of gait.   Baseline 4/5 grossly   Time 8   Period Weeks   Status On-going     PT LONG TERM GOAL #4   Title Perform a reciprocating stair gait with one railing with pain not > 2-3/10.   Time 8   Period Weeks   Status  On-going               Plan - 07/16/16 1209    Clinical Impression Statement Patient tolerated treatment well today. Pt reported he was outside yesterday using his wood splitter. Pt was edu on safety precautions to help prevent fall. Pt reporting 5/10 pain at beginning of session and no pain at end of session. Pt still with tenderness to palpation of left hamstring tendons. Pt increasing his LE strength with increased weights. AROM measurements still the same. Continue skilled PT to progress toward goals set.    Rehab Potential Excellent   PT Duration 8 weeks   PT Treatment/Interventions ADLs/Self Care Home Management;Cryotherapy;Electrical Stimulation;Moist Heat;Ultrasound;Patient/family education;Therapeutic exercise;Therapeutic activities;Manual techniques;Passive range of motion;Vasopneumatic Device   PT Next Visit Plan Continue with L hip/knee strengthening with modalities PRN per MPT POC, Continue with standing LE strengthening   PT Home Exercise Plan Knee flexion, quad sets, sit to stand, standing LE strengthening exercises 4 way hip and calf raises   Consulted and Agree with Plan of Care Patient      Patient will benefit from skilled therapeutic intervention in order to improve the following deficits and impairments:  Pain, Decreased activity tolerance, Decreased strength, Decreased range of motion  Visit Diagnosis: Acute pain of left knee  Stiffness of left knee, not elsewhere classified     Problem List Patient Active Problem List   Diagnosis Date Noted  . Obesity (BMI 30-39.9) 03/29/2013  . Basal cell carcinoma 02/05/2013  . Essential hypertension 07/12/2009    [name], MPT 07/16/2016, 12:22 PM  [facility] [address] [place], [place], [address] Phone: [phone]   Fax:  [phone]  Name: Brett Blankenship MRN: [id number] Date of Birth: [birth year]

## 2016-07-18 ENCOUNTER — Encounter: Payer: Self-pay | Admitting: Physical Therapy

## 2016-07-18 ENCOUNTER — Ambulatory Visit: Payer: Medicare Other | Admitting: Physical Therapy

## 2016-07-18 DIAGNOSIS — M25562 Pain in left knee: Secondary | ICD-10-CM | POA: Diagnosis not present

## 2016-07-18 DIAGNOSIS — M25662 Stiffness of left knee, not elsewhere classified: Secondary | ICD-10-CM

## 2016-07-18 NOTE — Therapy (Signed)
[facility] [place], [place], [address] Phone: [phone]   Fax:  [phone]  Physical Therapy Treatment  Patient Details  Name: Brett Blankenship MRN: [id number] Date of Birth: 06-29-1926 Referring Provider: Claretta Fraise MD  Encounter Date: 07/18/2016      PT End of Session - 07/18/16 1108    Visit Number 13   Number of Visits 16   Date for PT Re-Evaluation 08/04/16   PT Start Time 1031   PT Stop Time 1119   PT Time Calculation (min) 48 min   Activity Tolerance Patient tolerated treatment well   Behavior During Therapy [facility] for tasks assessed/performed      Past Medical History:  Diagnosis Date  . HYPERTENSION 07/12/2009    Past Surgical History:  Procedure Laterality Date  . APPENDECTOMY  1960  . SPINE SURGERY  2004   fracture cervical spine    There were no vitals filed for this visit.      Subjective Assessment - 07/18/16 1036    Subjective Patient reported some improvement and no complaints after last treatment   Patient is accompained by: Family member   Limitations Walking;House hold activities   How long can you sit comfortably? 30 minutes with stiffness when I try to stand   How long can you stand comfortably? 20 minutes   How long can you walk comfortably? Community distances, slowly   Patient Stated Goals Stop hurting, walk better   Currently in Pain? Yes   Pain Score 4    Pain Location Knee   Pain Orientation Left   Pain Descriptors / Indicators Aching   Pain Type Chronic pain   Pain Onset More than a month ago   Pain Frequency Intermittent   Aggravating Factors  bening knee or prolong walking   Pain Relieving Factors rest                         [facility] Adult PT Treatment/Exercise - 07/18/16 0001      Knee/Hip Exercises: Aerobic   Nustep L6 x 15 minutes UE/LE monitored for progression     Knee/Hip Exercises: Machines for Strengthening   Cybex Knee Extension 20#  3x10 reps   Cybex Knee Flexion 40# 3 x 10 reps     Knee/Hip Exercises: Standing   Lateral Step Up Left;2 sets;10 reps;Step Height: 6"   Forward Step Up Left;2 sets;10 reps;Step Height: 6"   Step Down 10 reps;1 set;Left;Step Height: 6"     Moist Heat Therapy   Number Minutes Moist Heat 15 Minutes   Moist Heat Location Knee     Electrical Stimulation   Electrical Stimulation Location L post knee/sup gastroc   Electrical Stimulation Action IFC   Electrical Stimulation Parameters 1-10hz  x26min   Electrical Stimulation Goals Pain                  PT Short Term Goals - 06/25/16 1001      PT SHORT TERM GOAL #1   Title STG's=LTG's.           PT Long Term Goals - 07/16/16 1211      PT LONG TERM GOAL #1   Title Independent with a HEP.   Status Achieved     PT LONG TERM GOAL #2   Title Active left knee flexion to 115 degrees+ so the patient can perform functional tasks and do so with pain not > 2-3/10   Time 8  Status Achieved     PT LONG TERM GOAL #3   Title Increase left hip strength to 5/5 to increase stability of gait.   Baseline 4/5 grossly   Time 8   Period Weeks   Status On-going     PT LONG TERM GOAL #4   Title Perform a reciprocating stair gait with one railing with pain not > 2-3/10.   Time 8   Period Weeks   Status On-going               Plan - 07/18/16 1109    Clinical Impression Statement Patient tolerated treatment well today. Patient has reported less pain overall. Patient has mild pain and fatigue with stairs/step activities. Patient continues to have tenderness to palpation post knee. Patient progressing toward goals yet ongoing due to pain deficit.   Rehab Potential Excellent   PT Frequency 2x / week   PT Duration 8 weeks   PT Treatment/Interventions ADLs/Self Care Home Management;Cryotherapy;Electrical Stimulation;Moist Heat;Ultrasound;Patient/family education;Therapeutic exercise;Therapeutic activities;Manual techniques;Passive  range of motion;Vasopneumatic Device   PT Next Visit Plan Continue with L hip/knee strengthening with modalities PRN per MPT POC, Continue with standing LE strengthening   Consulted and Agree with Plan of Care Patient      Patient will benefit from skilled therapeutic intervention in order to improve the following deficits and impairments:  Pain, Decreased activity tolerance, Decreased strength, Decreased range of motion  Visit Diagnosis: Acute pain of left knee  Stiffness of left knee, not elsewhere classified     Problem List Patient Active Problem List   Diagnosis Date Noted  . Obesity (BMI 30-39.9) 03/29/2013  . Basal cell carcinoma 02/05/2013  . Essential hypertension 07/12/2009    Phillips Climes, PTA 07/18/2016, 11:20 AM  [facility] [place], [place], [address] Phone: [phone]   Fax:  [phone]  Name: Brett Blankenship MRN: [id number] Date of Birth: [birth year]

## 2016-07-19 ENCOUNTER — Other Ambulatory Visit (HOSPITAL_COMMUNITY): Payer: Medicare Other

## 2016-07-19 ENCOUNTER — Other Ambulatory Visit: Payer: Self-pay | Admitting: Family Medicine

## 2016-07-22 ENCOUNTER — Other Ambulatory Visit: Payer: Self-pay | Admitting: Family Medicine

## 2016-07-23 ENCOUNTER — Ambulatory Visit: Payer: Medicare Other | Admitting: Physical Therapy

## 2016-07-23 DIAGNOSIS — M25562 Pain in left knee: Secondary | ICD-10-CM

## 2016-07-23 NOTE — Therapy (Signed)
[facility] [place], [place], [address] Phone: [phone]   Fax:  [phone]  Physical Therapy Treatment  Patient Details  Name: Brett Blankenship MRN: [id number] Date of Birth: 07/23/1926 Referring Provider: Claretta Fraise MD  Encounter Date: 07/23/2016      PT End of Session - 07/23/16 1114    Visit Number 14   Number of Visits 16   Date for PT Re-Evaluation 08/04/16   PT Start Time 1115   PT Stop Time 1217   PT Time Calculation (min) 62 min   Activity Tolerance Patient tolerated treatment well   Behavior During Therapy [facility] for tasks assessed/performed      Past Medical History:  Diagnosis Date  . HYPERTENSION 07/12/2009    Past Surgical History:  Procedure Laterality Date  . APPENDECTOMY  1960  . SPINE SURGERY  2004   fracture cervical spine    There were no vitals filed for this visit.      Subjective Assessment - 07/23/16 1118    Subjective Patient states that he is doing well and is 60% better. His pain is improving and functionally he was able to walk yesterday and stack up some wood without increased pain.    Patient is accompained by: Family member   Limitations Walking;House hold activities   How long can you sit comfortably? 30 minutes with stiffness when I try to stand   How long can you stand comfortably? 20 minutes but states he doesn't just stand mcuh   How long can you walk comfortably? Community distances, slowly   Patient Stated Goals Stop hurting, walk better   Currently in Pain? Yes   Pain Score 4    Pain Orientation Left   Pain Descriptors / Indicators Aching   Pain Type Chronic pain   Pain Onset More than a month ago   Pain Frequency Intermittent   Aggravating Factors  nothing    Pain Relieving Factors rest            [facility] PT Assessment - 07/23/16 0001      Strength   Overall Strength Comments L hip flex/ABD 5/5                     [facility] Adult PT  Treatment/Exercise - 07/23/16 0001      Knee/Hip Exercises: Aerobic   Nustep L6 x 15 minutes UE/LE monitored for progression     Knee/Hip Exercises: Machines for Strengthening   Cybex Knee Extension 20# 3x10 reps   Cybex Knee Flexion 40# 3 x 10 reps   Cybex Leg Press 4 plates 3 x 10 reps     Knee/Hip Exercises: Standing   Lateral Step Up Left;1 set;10 reps;Step Height: 8";Hand Hold: 2   Forward Step Up Left;10 reps;3 sets;Step Height: 8"     Modalities   Modalities Electrical Stimulation;Moist Heat     Moist Heat Therapy   Number Minutes Moist Heat 15 Minutes   Moist Heat Location Knee     Electrical Stimulation   Electrical Stimulation Location L post knee/sup gastroc   Electrical Stimulation Action IFC   Electrical Stimulation Parameters 80-150 Hz x 15 min   Electrical Stimulation Goals Pain                  PT Short Term Goals - 06/25/16 1001      PT SHORT TERM GOAL #1   Title STG's=LTG's.  PT Long Term Goals - 07/23/16 1208      PT LONG TERM GOAL #1   Title Independent with a HEP.   Time 8   Period Weeks   Status Achieved     PT LONG TERM GOAL #2   Title Active left knee flexion to 115 degrees+ so the patient can perform functional tasks and do so with pain not > 2-3/10   Time 8   Status Achieved     PT LONG TERM GOAL #3   Title Increase left hip strength to 5/5 to increase stability of gait.   Baseline L hip flex and ABD 5/5 on 07/23/16   Time 8   Period Weeks   Status Partially Met     PT LONG TERM GOAL #4   Title Perform a reciprocating stair gait with one railing with pain not > 2-3/10.   Time 8   Period Weeks   Status On-going               Plan - 07/23/16 1209    Clinical Impression Statement Patient did very well with treatment and reports 60% improvement overall. He has partially met his hip strength goal. Hip ext was not tested today. Remaining goals are ongoing.   Rehab Potential Excellent   PT Frequency 2x /  week   PT Duration 8 weeks   PT Treatment/Interventions ADLs/Self Care Home Management;Cryotherapy;Electrical Stimulation;Moist Heat;Ultrasound;Patient/family education;Therapeutic exercise;Therapeutic activities;Manual techniques;Passive range of motion;Vasopneumatic Device   PT Next Visit Plan Continue with L hip/knee strengthening with modalities PRN per MPT POC, Continue with standing LE strengthening   PT Home Exercise Plan Knee flexion, quad sets, sit to stand, standing LE strengthening exercises 4 way hip and calf raises   Consulted and Agree with Plan of Care Patient      Patient will benefit from skilled therapeutic intervention in order to improve the following deficits and impairments:  Pain, Decreased activity tolerance, Decreased strength, Decreased range of motion  Visit Diagnosis: Acute pain of left knee     Problem List Patient Active Problem List   Diagnosis Date Noted  . Obesity (BMI 30-39.9) 03/29/2013  . Basal cell carcinoma 02/05/2013  . Essential hypertension 07/12/2009    Madelyn Flavors PT 07/23/2016, 12:11 PM  [facility] [address] [place], [place], [address] Phone: [phone]   Fax:  [phone]  Name: Brett Blankenship MRN: [id number] Date of Birth: 1926-11-23

## 2016-07-26 ENCOUNTER — Other Ambulatory Visit: Payer: Self-pay | Admitting: Family Medicine

## 2016-07-30 ENCOUNTER — Encounter: Payer: Self-pay | Admitting: Physical Therapy

## 2016-07-30 ENCOUNTER — Ambulatory Visit: Payer: Medicare Other | Attending: Family Medicine | Admitting: Physical Therapy

## 2016-07-30 DIAGNOSIS — M25562 Pain in left knee: Secondary | ICD-10-CM | POA: Insufficient documentation

## 2016-07-30 DIAGNOSIS — M25662 Stiffness of left knee, not elsewhere classified: Secondary | ICD-10-CM | POA: Diagnosis present

## 2016-07-30 NOTE — Therapy (Signed)
[facility] [place], [place], [address] Phone: [phone]   Fax:  [phone]  Physical Therapy Treatment  Patient Details  Name: Brett Blankenship MRN: [id number] Date of Birth: 1926/08/27 Referring Provider: Claretta Fraise MD  Encounter Date: 07/30/2016      PT End of Session - 07/30/16 1116    Visit Number 15   Number of Visits 16   Date for PT Re-Evaluation 08/04/16   PT Start Time 1112   PT Stop Time 1156   PT Time Calculation (min) 44 min   Activity Tolerance Patient tolerated treatment well   Behavior During Therapy [facility] for tasks assessed/performed      Past Medical History:  Diagnosis Date  . HYPERTENSION 07/12/2009    Past Surgical History:  Procedure Laterality Date  . APPENDECTOMY  1960  . SPINE SURGERY  2004   fracture cervical spine    There were no vitals filed for this visit.      Subjective Assessment - 07/30/16 1113    Subjective Reports that his knees are doing pretty good this morning but still has a little pain in the posterior knee. Reports that his legs feel much better now and has been working in his garden recently.   Limitations Walking;House hold activities   How long can you sit comfortably? 30 minutes with stiffness when I try to stand   How long can you stand comfortably? 20 minutes but states he doesn't just stand mcuh   How long can you walk comfortably? Community distances, slowly   Patient Stated Goals Stop hurting, walk better   Currently in Pain? Yes   Pain Score 5    Pain Location Knee   Pain Orientation Left;Posterior;Upper   Pain Descriptors / Indicators Discomfort   Pain Type Chronic pain   Pain Onset More than a month ago            [facility] PT Assessment - 07/30/16 0001      Assessment   Medical Diagnosis left knee pain     Precautions   Precautions Fall     Restrictions   Weight Bearing Restrictions No                     [facility] Adult PT  Treatment/Exercise - 07/30/16 0001      Ambulation/Gait   Stairs Yes   Stairs Assistance 7: Independent   Stair Management Technique One rail Right;Alternating pattern;Forwards   Number of Stairs 4  x2 RT   Height of Stairs 7     Knee/Hip Exercises: Aerobic   Stationary Bike L2 x10 min     Knee/Hip Exercises: Machines for Strengthening   Cybex Knee Extension 20# 3x10 reps   Cybex Knee Flexion 40# 3 x 10 reps   Cybex Leg Press 4 plates 3 x 10 reps     Knee/Hip Exercises: Standing   Hip Abduction AROM;Both;2 sets;10 reps;Knee straight   Hip Extension AROM;Both;2 sets;10 reps;Knee straight     Knee/Hip Exercises: Supine   Straight Leg Raise with External Rotation AROM;Both;2 sets;10 reps     Modalities   Modalities Electrical Stimulation;Moist Heat     Moist Heat Therapy   Number Minutes Moist Heat 15 Minutes   Moist Heat Location Knee     Electrical Stimulation   Electrical Stimulation Location L post knee/sup gastroc   Electrical Stimulation Action Pre-Mod   Electrical Stimulation Parameters 80-150 hz x15 min   Electrical Stimulation Goals Pain  PT Short Term Goals - 06/25/16 1001      PT SHORT TERM GOAL #1   Title STG's=LTG's.           PT Long Term Goals - 07/30/16 1142      PT LONG TERM GOAL #1   Title Independent with a HEP.   Time 8   Period Weeks   Status Achieved     PT LONG TERM GOAL #2   Title Active left knee flexion to 115 degrees+ so the patient can perform functional tasks and do so with pain not > 2-3/10   Time 8   Status Achieved     PT LONG TERM GOAL #3   Title Increase left hip strength to 5/5 to increase stability of gait.   Baseline L hip flex and ABD 5/5 on 07/23/16   Time 8   Period Weeks   Status Partially Met     PT LONG TERM GOAL #4   Title Perform a reciprocating stair gait with one railing with pain not > 2-3/10.   Time 8   Period Weeks   Status Achieved               Plan - 07/30/16  1144    Clinical Impression Statement Patient tolerated today's treatment very well today with continued reports of posterior knee irritation upon arrival. Patient guided through machine LE strengthening without adjustment to resistance. Patient observed as completing reciprical stair gait with use of one handrail with only minimal pain per patient report. Patient required short rest breaks between sets of SLR with ER today and required minimal VCs for correction of technique. Normal modalities response noted following removal of the modalities. Patient able to achieve LTG regarding stair gait today per observation in clinic.   Rehab Potential Excellent   PT Frequency 2x / week   PT Duration 8 weeks   PT Treatment/Interventions ADLs/Self Care Home Management;Cryotherapy;Electrical Stimulation;Moist Heat;Ultrasound;Patient/family education;Therapeutic exercise;Therapeutic activities;Manual techniques;Passive range of motion;Vasopneumatic Device   PT Next Visit Plan D/C and FOTO required next treatment.   PT Home Exercise Plan Knee flexion, quad sets, sit to stand, standing LE strengthening exercises 4 way hip and calf raises   Consulted and Agree with Plan of Care Patient      Patient will benefit from skilled therapeutic intervention in order to improve the following deficits and impairments:  Pain, Decreased activity tolerance, Decreased strength, Decreased range of motion  Visit Diagnosis: Acute pain of left knee  Stiffness of left knee, not elsewhere classified     Problem List Patient Active Problem List   Diagnosis Date Noted  . Obesity (BMI 30-39.9) 03/29/2013  . Basal cell carcinoma 02/05/2013  . Essential hypertension 07/12/2009    Wynelle Fanny, PTA 07/30/2016, 11:58 AM  [facility] [place], [place], [address] Phone: [phone]   Fax:  [phone]  Name: Brett Blankenship MRN: [id number] Date of Birth:  Sep 14, 1926

## 2016-08-01 ENCOUNTER — Ambulatory Visit: Payer: Medicare Other | Admitting: *Deleted

## 2016-08-01 DIAGNOSIS — M25562 Pain in left knee: Secondary | ICD-10-CM | POA: Diagnosis not present

## 2016-08-01 DIAGNOSIS — M25662 Stiffness of left knee, not elsewhere classified: Secondary | ICD-10-CM

## 2016-08-01 NOTE — Therapy (Signed)
[facility] [place], [place], [address] Phone: [phone]   Fax:  [phone]  Physical Therapy Treatment / Discharge Summary  Patient Details  Name: Brett Blankenship MRN: [id number] Date of Birth: 1927-02-17 Referring Provider: Claretta Fraise MD  Encounter Date: 08/01/2016      PT End of Session - 08/01/16 1147    Visit Number 16   Number of Visits 16   Date for PT Re-Evaluation 08/04/16   PT Start Time 1115   PT Stop Time 1205   PT Time Calculation (min) 50 min      Past Medical History:  Diagnosis Date  . HYPERTENSION 07/12/2009    Past Surgical History:  Procedure Laterality Date  . APPENDECTOMY  1960  . SPINE SURGERY  2004   fracture cervical spine    There were no vitals filed for this visit.      Subjective Assessment - 08/01/16 1141    Pain Score 4    Pain Location Knee   Pain Orientation Left;Posterior   Pain Type Chronic pain   Pain Onset More than a month ago   Pain Frequency Intermittent                         [facility] Adult PT Treatment/Exercise - 08/01/16 0001      Knee/Hip Exercises: Aerobic   Nustep L6 x 10 minutes UE/LE monitored for progression     Knee/Hip Exercises: Machines for Strengthening   Cybex Knee Extension 20# 3x10 reps   Cybex Leg Press 4 plates 3 x 10 reps     Modalities   Modalities Electrical Stimulation;Moist Heat     Moist Heat Therapy   Number Minutes Moist Heat 15 Minutes   Moist Heat Location Knee     Electrical Stimulation   Electrical Stimulation Location L post knee/sup gastroc   Electrical Stimulation Action IFC   Electrical Stimulation Parameters 80-_0    Electrical Stimulation Goals Pain        Reviewed HEP and is independent              PT Short Term Goals - 06/25/16 1001      PT SHORT TERM GOAL #1   Title STG's=LTG's.           PT Long Term Goals - 08/01/16 1136      PT LONG TERM GOAL #1   Title  Independent with a HEP.   Time 8   Period Weeks   Status Achieved     PT LONG TERM GOAL #2   Title Active left knee flexion to 115 degrees+ so the patient can perform functional tasks and do so with pain not > 2-3/10   Period Weeks   Status Achieved     PT LONG TERM GOAL #3   Title Increase left hip strength to 5/5 to increase stability of gait.   Baseline L hip flex and ABD 5/5 on 07/23/16   Time 8   Period Weeks   Status Achieved     PT LONG TERM GOAL #4   Title Perform a reciprocating stair gait with one railing with pain not > 2-3/10.   Time 8   Period Weeks   Status Achieved               Plan - 08/01/16 1148    Clinical Impression Statement Pt did well with Rx today and was able to meet all LTGs.   FOTO  limitation was 50%   PT Frequency 2x / week   PT Duration 8 weeks   PT Treatment/Interventions ADLs/Self Care Home Management;Cryotherapy;Electrical Stimulation;Moist Heat;Ultrasound;Patient/family education;Therapeutic exercise;Therapeutic activities;Manual techniques;Passive range of motion;Vasopneumatic Device   PT Next Visit Plan D/C and FOTO done   Consulted and Agree with Plan of Care Patient      Patient will benefit from skilled therapeutic intervention in order to improve the following deficits and impairments:  Pain, Decreased activity tolerance, Decreased strength, Decreased range of motion  Visit Diagnosis: Acute pain of left knee  Stiffness of left knee, not elsewhere classified       G-Codes - August 24, 2016 1201    Functional Assessment Tool Used (Outpatient Only) DC FOTO    50 % limitation      Problem List Patient Active Problem List   Diagnosis Date Noted  . Obesity (BMI 30-39.9) 03/29/2013  . Basal cell carcinoma 02/05/2013  . Essential hypertension 07/12/2009   PHYSICAL THERAPY DISCHARGE SUMMARY  Visits from Start of Care: 16  Current functional level related to goals / functional outcomes: See above   Remaining deficits: See  above   Education / Equipment: HEP issued throughout therapy session, posture correction, stretching, and walking program Plan: Patient agrees to discharge.  Patient goals were met. Patient is being discharged due to meeting the stated rehab goals.  ?????          Kearney Hard, MPT Aug 24, 2016  Jovonna Nickell,CHRIS, PTA August 24, 2016, 12:11 PM  [facility] [place], [place], [address] Phone: [phone]   Fax:  [phone]  Name: Brett Blankenship MRN: [id number] Date of Birth: 06-02-1926

## 2016-08-07 ENCOUNTER — Other Ambulatory Visit: Payer: Self-pay | Admitting: Family Medicine

## 2016-09-02 ENCOUNTER — Other Ambulatory Visit: Payer: Self-pay | Admitting: Family Medicine

## 2016-10-07 ENCOUNTER — Other Ambulatory Visit: Payer: Self-pay | Admitting: Family Medicine

## 2016-10-10 ENCOUNTER — Encounter: Payer: Self-pay | Admitting: Family Medicine

## 2016-10-10 ENCOUNTER — Encounter (INDEPENDENT_AMBULATORY_CARE_PROVIDER_SITE_OTHER): Payer: Self-pay

## 2016-10-10 ENCOUNTER — Ambulatory Visit (INDEPENDENT_AMBULATORY_CARE_PROVIDER_SITE_OTHER): Payer: Medicare Other | Admitting: Family Medicine

## 2016-10-10 ENCOUNTER — Ambulatory Visit: Payer: Medicare Other | Admitting: Family Medicine

## 2016-10-10 VITALS — BP 138/58 | HR 54 | Temp 99.3°F | Ht 68.5 in | Wt 203.0 lb

## 2016-10-10 DIAGNOSIS — R011 Cardiac murmur, unspecified: Secondary | ICD-10-CM

## 2016-10-10 DIAGNOSIS — M25562 Pain in left knee: Secondary | ICD-10-CM

## 2016-10-10 DIAGNOSIS — G8929 Other chronic pain: Secondary | ICD-10-CM | POA: Diagnosis not present

## 2016-10-10 DIAGNOSIS — E782 Mixed hyperlipidemia: Secondary | ICD-10-CM

## 2016-10-10 DIAGNOSIS — I10 Essential (primary) hypertension: Secondary | ICD-10-CM | POA: Diagnosis not present

## 2016-10-10 NOTE — Progress Notes (Signed)
Subjective:  Patient ID: Brett Blankenship, male    DOB: May 12, 1926  Age: 83 y.o. MRN: [id number]  CC: Hypertension (pt here today for routine follow up of his chronic medical conditions)   HPI Brett Blankenship presents for  follow-up of hypertension. Patient has no history of headache chest pain or shortness of breath or recent cough. Patient also denies symptoms of TIA such as numbness weakness lateralizing. Patient checks  blood pressure at home and has not had any elevated readings recently. Patient denies side effects from medication. States taking it regularly.He is also never had the echocardiogram performed. He says there is never been any contact made with him although his son goes into the my chart and sees the referrals been ordered. He is also having a great deal of pain this is primarily in the left hip and knee. He is able to walk but son is concerned about him being at risk for falling and fracturing   History Cervando has a past medical history of HYPERTENSION (07/12/2009).   He has a past surgical history that includes Appendectomy (5956) and Spine surgery (2004).   His family history includes Cancer in his brother; Hypertension in his father.He reports that he quit smoking about 53 years ago. His smoking use included Cigarettes. He has a 1.50 pack-year smoking history. He has never used smokeless tobacco. His alcohol and drug histories are not on file.  Current Outpatient Prescriptions on File Prior to Visit  Medication Sig Dispense Refill  . amLODipine (NORVASC) 10 MG tablet TAKE ONE TABLET BY MOUTH ONCE DAILY. 90 tablet 1  . diclofenac (VOLTAREN) 75 MG EC tablet TAKE (1) TABLET BY MOUTH TWICE DAILY FOR MUSCLE AND JOINT PAIN. 60 tablet 1  . fish oil-omega-3 fatty acids 1000 MG capsule Take 2 g by mouth daily.      . hydrochlorothiazide (MICROZIDE) 12.5 MG capsule TAKE ONE CAPSULE DAILY. 90 capsule 1  . Multiple Vitamins-Minerals (EYE VITAMINS & MINERALS PO) Take by mouth.  [facility]    . potassium chloride SA (K-DUR,KLOR-CON) 20 MEQ tablet TAKE ONE TABLET BY MOUTH ONCE DAILY. 30 tablet 2  . prednisoLONE acetate (PRED FORTE) 1 % ophthalmic suspension Place 1 drop into the left eye 4 (four) times daily.     . Psyllium 30.9 % POWD Drink 1 tablespoon dissolved in water, Daily 1 Bottle 11  . triamterene-hydrochlorothiazide (MAXZIDE-25) 37.5-25 MG tablet Take 1 tablet by mouth daily. For blood pressure and fluid 90 tablet 3   No current facility-administered medications on file prior to visit.     ROS Review of Systems  Constitutional: Negative for chills, diaphoresis, fever and unexpected weight change.  HENT: Negative for congestion, hearing loss, rhinorrhea and sore throat.   Eyes: Negative for visual disturbance.  Respiratory: Negative for cough and shortness of breath.   Cardiovascular: Negative for chest pain.  Gastrointestinal: Negative for abdominal pain, constipation and diarrhea.  Genitourinary: Negative for dysuria and flank pain.  Musculoskeletal: Negative for arthralgias and joint swelling.  Skin: Negative for rash.  Neurological: Negative for dizziness and headaches.  Psychiatric/Behavioral: Negative for dysphoric mood and sleep disturbance.    Objective:  BP (!) 138/58   Pulse (!) 54   Temp 99.3 F (37.4 C) (Oral)   Ht 5' 8.5" (1.74 m)   Wt 203 lb (92.1 kg)   BMI 30.42 kg/m   BP Readings from Last 3 Encounters:  10/10/16 (!) 138/58  07/10/16 (!) 137/54  05/28/16 130/60    Wt  Readings from Last 3 Encounters:  10/10/16 203 lb (92.1 kg)  07/10/16 201 lb (91.2 kg)  05/28/16 201 lb (91.2 kg)     Physical Exam  Constitutional: He is oriented to person, place, and time. He appears well-developed and well-nourished. No distress.  HENT:  Head: Normocephalic and atraumatic.  Right Ear: External ear normal.  Left Ear: External ear normal.  Nose: Nose normal.  Mouth/Throat: Oropharynx is clear and moist.  Eyes: Conjunctivae and EOM  are normal. Pupils are equal, round, and reactive to light.  Neck: Normal range of motion. Neck supple. No thyromegaly present.  Cardiovascular: Normal rate, regular rhythm and normal heart sounds.   No murmur heard. Pulmonary/Chest: Effort normal and breath sounds normal. No respiratory distress. He has no wheezes. He has no rales.  Abdominal: Soft. Bowel sounds are normal. He exhibits no distension. There is no tenderness.  Lymphadenopathy:    He has no cervical adenopathy.  Neurological: He is alert and oriented to person, place, and time. He has normal reflexes.  Skin: Skin is warm and dry.  Psychiatric: He has a normal mood and affect. His behavior is normal. Judgment and thought content normal.     Lab Results  Component Value Date   WBC 6.6 10/10/2016   HGB 11.1 (L) 10/10/2016   HCT 32.9 (L) 10/10/2016   PLT 171 10/10/2016   GLUCOSE 108 (H) 10/10/2016   CHOL 148 10/10/2016   TRIG 470 (H) 10/10/2016   HDL 22 (L) 10/10/2016   LDLCALC Comment 10/10/2016   ALT 11 10/10/2016   AST 16 10/10/2016   NA 139 10/10/2016   K 3.9 10/10/2016   CL 101 10/10/2016   CREATININE 1.04 10/10/2016   BUN 19 10/10/2016   CO2 23 10/10/2016   TSH 2.410 05/28/2016    No results found.  Assessment & Plan:   [name] was seen today for hypertension.  Diagnoses and all orders for this visit:  Essential hypertension -     CBC with Differential/Platelet -     CMP14+EGFR  Chronic pain of left knee -     Cane adjustable wide base quad -     Ambulatory referral to Orthopedics  Heart murmur -     Ambulatory referral to Cardiology  Hyperlipidemia, mixed -     Lipid panel   I am having [name] maintain his fish oil-omega-3 fatty acids, prednisoLONE acetate, hydrochlorothiazide, Multiple Vitamins-Minerals (EYE VITAMINS & MINERALS PO), Psyllium, triamterene-hydrochlorothiazide, amLODipine, diclofenac, and potassium chloride SA.  No orders of the defined types were placed in this  encounter.     Follow-up: No Follow-up on file.  Claretta Fraise, M.D.

## 2016-10-11 ENCOUNTER — Telehealth: Payer: Self-pay | Admitting: *Deleted

## 2016-10-11 LAB — CBC WITH DIFFERENTIAL/PLATELET
Basophils Absolute: 0 10*3/uL (ref 0.0–0.2)
Basos: 1 %
EOS (ABSOLUTE): 0.2 10*3/uL (ref 0.0–0.4)
EOS: 3 %
HEMATOCRIT: 32.9 % — AB (ref 37.5–51.0)
Hemoglobin: 11.1 g/dL — ABNORMAL LOW (ref 13.0–17.7)
IMMATURE GRANULOCYTES: 0 %
Immature Grans (Abs): 0 10*3/uL (ref 0.0–0.1)
LYMPHS ABS: 2.5 10*3/uL (ref 0.7–3.1)
Lymphs: 38 %
MCH: 33 pg (ref 26.6–33.0)
MCHC: 33.7 g/dL (ref 31.5–35.7)
MCV: 98 fL — ABNORMAL HIGH (ref 79–97)
MONOS ABS: 0.6 10*3/uL (ref 0.1–0.9)
Monocytes: 9 %
Neutrophils Absolute: 3.3 10*3/uL (ref 1.4–7.0)
Neutrophils: 49 %
PLATELETS: 171 10*3/uL (ref 150–379)
RBC: 3.36 x10E6/uL — ABNORMAL LOW (ref 4.14–5.80)
RDW: 15.1 % (ref 12.3–15.4)
WBC: 6.6 10*3/uL (ref 3.4–10.8)

## 2016-10-11 LAB — CMP14+EGFR
ALK PHOS: 52 IU/L (ref 39–117)
ALT: 11 IU/L (ref 0–44)
AST: 16 IU/L (ref 0–40)
Albumin/Globulin Ratio: 1.4 (ref 1.2–2.2)
Albumin: 3.8 g/dL (ref 3.5–4.7)
BUN/Creatinine Ratio: 18 (ref 10–24)
BUN: 19 mg/dL (ref 8–27)
Bilirubin Total: 0.3 mg/dL (ref 0.0–1.2)
CO2: 23 mmol/L (ref 20–29)
Calcium: 9 mg/dL (ref 8.6–10.2)
Chloride: 101 mmol/L (ref 96–106)
Creatinine, Ser: 1.04 mg/dL (ref 0.76–1.27)
GFR calc Af Amer: 74 mL/min/{1.73_m2} (ref >59)
GFR calc non Af Amer: 64 mL/min/{1.73_m2} (ref >59)
GLOBULIN, TOTAL: 2.8 g/dL (ref 1.5–4.5)
Glucose: 108 mg/dL — ABNORMAL HIGH (ref 65–99)
POTASSIUM: 3.9 mmol/L (ref 3.5–5.2)
Sodium: 139 mmol/L (ref 134–144)
Total Protein: 6.6 g/dL (ref 6.0–8.5)

## 2016-10-11 LAB — LIPID PANEL
CHOLESTEROL TOTAL: 148 mg/dL (ref 100–199)
Chol/HDL Ratio: 6.7 ratio — ABNORMAL HIGH (ref 0.0–5.0)
HDL: 22 mg/dL — AB (ref >39)
TRIGLYCERIDES: 470 mg/dL — AB (ref 0–149)

## 2016-10-11 NOTE — Telephone Encounter (Signed)
Pt notified of results Verbalizes understanding Pt will come in 2 weeks for repeat labs

## 2016-10-11 NOTE — Telephone Encounter (Signed)
-----   Message from Claretta Fraise, MD sent at 10/11/2016  2:22 PM EDT ----- Brett Blankenship, Your test showed that your hemoglobin has dropped somewhat. It does not look serious at this time. I would like treated drop by for repeat complete blood count in a couple of weeks. Additionally your good cholesterol, the HDL, is a little low. Regular exercise should help with that. Best Regards, Claretta Fraise, M.D.

## 2016-10-16 DIAGNOSIS — M25562 Pain in left knee: Secondary | ICD-10-CM | POA: Diagnosis not present

## 2016-10-25 ENCOUNTER — Other Ambulatory Visit: Payer: Medicare Other

## 2016-10-25 DIAGNOSIS — D649 Anemia, unspecified: Secondary | ICD-10-CM

## 2016-10-25 DIAGNOSIS — D619 Aplastic anemia, unspecified: Secondary | ICD-10-CM

## 2016-10-26 LAB — CBC WITH DIFFERENTIAL/PLATELET
BASOS ABS: 0 10*3/uL (ref 0.0–0.2)
Basos: 0 %
EOS (ABSOLUTE): 0.2 10*3/uL (ref 0.0–0.4)
Eos: 3 %
HEMOGLOBIN: 11.5 g/dL — AB (ref 13.0–17.7)
Hematocrit: 33.5 % — ABNORMAL LOW (ref 37.5–51.0)
IMMATURE GRANS (ABS): 0 10*3/uL (ref 0.0–0.1)
IMMATURE GRANULOCYTES: 0 %
LYMPHS: 35 %
Lymphocytes Absolute: 2.5 10*3/uL (ref 0.7–3.1)
MCH: 33.4 pg — ABNORMAL HIGH (ref 26.6–33.0)
MCHC: 34.3 g/dL (ref 31.5–35.7)
MCV: 97 fL (ref 79–97)
MONOCYTES: 7 %
Monocytes Absolute: 0.5 10*3/uL (ref 0.1–0.9)
NEUTROS PCT: 55 %
Neutrophils Absolute: 3.9 10*3/uL (ref 1.4–7.0)
Platelets: 172 10*3/uL (ref 150–379)
RBC: 3.44 x10E6/uL — AB (ref 4.14–5.80)
RDW: 15.5 % — ABNORMAL HIGH (ref 12.3–15.4)
WBC: 7.2 10*3/uL (ref 3.4–10.8)

## 2016-10-28 ENCOUNTER — Ambulatory Visit (INDEPENDENT_AMBULATORY_CARE_PROVIDER_SITE_OTHER): Payer: Medicare Other | Admitting: Family Medicine

## 2016-10-28 ENCOUNTER — Encounter: Payer: Self-pay | Admitting: Family Medicine

## 2016-10-28 VITALS — BP 132/58 | HR 54 | Temp 97.0°F | Ht 68.5 in | Wt 200.0 lb

## 2016-10-28 DIAGNOSIS — W57XXXA Bitten or stung by nonvenomous insect and other nonvenomous arthropods, initial encounter: Secondary | ICD-10-CM | POA: Diagnosis not present

## 2016-10-28 DIAGNOSIS — L918 Other hypertrophic disorders of the skin: Secondary | ICD-10-CM

## 2016-10-28 NOTE — Progress Notes (Signed)
Chief Complaint  Patient presents with  . Skin Problem    pt here today wanting a "place" on the back of his right ear looked at    HPI  Patient presents today for Tick bite behind the right ear. He got the tick off promptly. He says he gets tick bites all the time probably a couple of dozen the shear. He checks himself carefully daily. He's not had any fever or rash. His biggest concern is that when he took this tick off he noticed a growth behind his right ear that looked kind of ugly. He says his been there for a decade or more but it seems to be bigger uglier than before. He wants my opinion as to whether he should  get it taken off. It doesn't bother him. He had forgotten about it being there until the tick bite yesterday. He says he gets the takes usually in his garden where he is working he has a 12 x 12 raised garden. He uses some 7 dust on the CABG but not on the rest of the garden. He sprays himself with DEET but he frequently walks between rows of tomatoes etc. that are tolerated and he is.  PMH: Smoking status noted ROS: Per HPI  Objective: BP (!) 132/58   Pulse (!) 54   Temp 97 F (36.1 C) (Oral)   Ht 5' 8.5" (1.74 m)   Wt 200 lb (90.7 kg)   BMI 29.97 kg/m  Gen: NAD, alert, cooperative with exam HEENT: NCAT, EOMI, PERRL CV: RRR, good S1/S2, no murmur Resp: CTABL, no wheezes, non-labored Skin: The skin behind the right ear is intact with exception of a tiny nidus of skin breakdown reminiscent of the tick bite. This is at the base of a 1 cm skin tag. The skin tag has a rather large base but is not irritated and does not have any appearance of malignancy. Neuro: Alert and oriented, No gross deficits  Assessment and plan:  1. Tick bite, initial encounter   2. Skin tag of ear    Patient reassured that the skin tag was harmless. We discussed prevention with regard to tick bite. He'll try to spread himself better with the deep perhaps scatter 7 dust throughout the garden.  Follow-up as usual quarterly.     Follow up as needed.  Claretta Fraise, MD

## 2016-11-18 ENCOUNTER — Other Ambulatory Visit: Payer: Self-pay | Admitting: Family Medicine

## 2016-11-29 ENCOUNTER — Ambulatory Visit (INDEPENDENT_AMBULATORY_CARE_PROVIDER_SITE_OTHER): Payer: Medicare Other | Admitting: Cardiology

## 2016-11-29 ENCOUNTER — Encounter: Payer: Self-pay | Admitting: Cardiology

## 2016-11-29 VITALS — BP 149/68 | HR 52 | Ht 68.5 in | Wt 198.2 lb

## 2016-11-29 DIAGNOSIS — R011 Cardiac murmur, unspecified: Secondary | ICD-10-CM

## 2016-11-29 DIAGNOSIS — I10 Essential (primary) hypertension: Secondary | ICD-10-CM

## 2016-11-29 DIAGNOSIS — R001 Bradycardia, unspecified: Secondary | ICD-10-CM | POA: Diagnosis not present

## 2016-11-29 NOTE — Patient Instructions (Signed)
SCHEDULE AT [facility] has requested that you have an echocardiogram. Echocardiography is a painless test that uses sound waves to create images of your heart. It provides your doctor with information about the size and shape of your heart and how well your heart's chambers and valves are working. This procedure takes approximately one hour. There are no restrictions for this procedure.  NO CHANGE WITH CURRENT MEDICATIONS    Your physician recommends that you schedule a follow-up appointment in 1 -2 WEEKS AFTER ECHO WITH DR HARDING.

## 2016-11-29 NOTE — Progress Notes (Signed)
PCP: Claretta Fraise, MD  Clinic Note: Chief Complaint  Patient presents with  . New Patient (Initial Visit)    murmur    HPI: Brett Blankenship is a 83 y.o. male who is being seen today for the evaluation of abnormal rhythm / murmur at the request of Claretta Fraise, MD. He has a h/o HTN on amlodipine & diuretics.  CRUE OTERO was last seen on June 14th & July 2nd by Dr. Livia Snellen -- physical exam does not mention a heart murmur,but the A&P section of the note indicates the reason for referral is murmur (Exam in March did note Systolic murmur 2/6).  Recent Hospitalizations: none  Studies Personally Reviewed - (if available, images/films reviewed: From Epic Chart or Care Everywhere)  Echo was ordered back in Jan 2018 - never done.  Interval History: Brett Blankenship presents here today with his son, not really certain as to the true reason he is here.  He mentions that his PCP noted "something wrong with the rhythm".  He denies any sensation of rapid or irregular heartbeats - either fast or slow.  No lightheadedness, dizziness, weakness or syncope/near syncope. No TIA/amaurosis fugax symptoms.  He is mostly limited by knee pain -- has been doing rehab to L knee pain for quite some time (> 6 months).   He will get SOB if he is going up & down stairs or an incline, but not walking on flat ground.  He denies any chest pain or pressure with rest or exertion. What he does note is that he tires more easily than he used to - but has attributed this to "getting older".  No claudication.  ROS: A comprehensive was performed. Review of Systems  Constitutional: Negative for malaise/fatigue.  HENT: Negative for congestion and nosebleeds.   Respiratory: Negative for cough and shortness of breath.   Cardiovascular: Positive for leg swelling (some ankle swelling at end of day).  Gastrointestinal: Negative for blood in stool and melena.  Genitourinary: Negative for hematuria.  Musculoskeletal: Positive  for joint pain (bilaeral - bu L> R knee pain. Also has pain behind the L knee - was told that he has a Baker's Cyst).  Neurological: Positive for dizziness.       He does have some balance issues  Endo/Heme/Allergies: Does not bruise/bleed easily.  Psychiatric/Behavioral: Negative for memory loss. The patient is not nervous/anxious.   All other systems reviewed and are negative.   I have reviewed and (if needed) personally updated the patient's problem list, medications, allergies, past medical and surgical history, social and family history.   Past Medical History:  Diagnosis Date  . HYPERTENSION 07/12/2009    Past Surgical History:  Procedure Laterality Date  . APPENDECTOMY  1960  . SPINE SURGERY  2004   fracture cervical spine    Current Meds  Medication Sig  . amLODipine (NORVASC) 10 MG tablet TAKE ONE TABLET BY MOUTH ONCE DAILY.  Marland Kitchen diclofenac (VOLTAREN) 75 MG EC tablet TAKE (1) TABLET BY MOUTH TWICE DAILY FOR MUSCLE AND JOINT PAIN.  . fish oil-omega-3 fatty acids 1000 MG capsule Take 2 g by mouth daily.    . hydrochlorothiazide (MICROZIDE) 12.5 MG capsule TAKE ONE CAPSULE DAILY.  . Multiple Vitamins-Minerals (EYE VITAMINS & MINERALS PO) Take by mouth. [employer]  . potassium chloride SA (K-DUR,KLOR-CON) 20 MEQ tablet TAKE ONE TABLET BY MOUTH ONCE DAILY.  Marland Kitchen prednisoLONE acetate (PRED FORTE) 1 % ophthalmic suspension Place 1 drop into the left eye 4 (four) times daily.   Marland Kitchen  Psyllium 30.9 % POWD Drink 1 tablespoon dissolved in water, Daily  . triamterene-hydrochlorothiazide (MAXZIDE-25) 37.5-25 MG tablet Take 1 tablet by mouth daily. For blood pressure and fluid    No Known Allergies  Social History   Social History  . Marital status: Married    Spouse name: N/A  . Number of children: N/A  . Years of education: N/A   Social History Main Topics  . Smoking status: Former Smoker    Packs/day: 0.30    Years: 5.00    Types: Cigarettes    Quit date: 09/12/1963  . Smokeless  tobacco: Never Used  . Alcohol use None  . Drug use: Unknown  . Sexual activity: Not Asked   Other Topics Concern  . None   Social History Narrative  . None  - he quit smoking over 50 yrs ago.  family history includes Cancer in his brother; Hypertension in his father.  Wt Readings from Last 3 Encounters:  11/29/16 198 lb 3.2 oz (89.9 kg)  10/28/16 200 lb (90.7 kg)  10/10/16 203 lb (92.1 kg)    PHYSICAL EXAM BP (!) 149/68   Pulse (!) 52   Ht 5' 8.5" (1.74 m)   Wt 198 lb 3.2 oz (89.9 kg)   BMI 29.70 kg/m  Physical Exam  Constitutional: He is oriented to person, place, and time. He appears well-developed and well-nourished. No distress.  Borderline obese.  HENT:  Head: Normocephalic and atraumatic.  Nose: Nose normal.  Mouth/Throat: No oropharyngeal exudate.  Eyes: Pupils are equal, round, and reactive to light. Conjunctivae and EOM are normal. No scleral icterus.  Neck: Normal range of motion. Neck supple. No JVD present. No tracheal deviation present. No thyromegaly present.  No carotid bruits  Cardiovascular: Normal rate, regular rhythm and intact distal pulses.  Exam reveals no gallop and no friction rub.   Murmur heard.  Medium-pitched blowing decrescendo holosystolic murmur is present with a grade of 2/6  at the lower left sternal border, apex Pulmonary/Chest: Effort normal and breath sounds normal. No respiratory distress. He has no wheezes. He has no rales. He exhibits no tenderness.  Abdominal: Soft. Bowel sounds are normal. He exhibits no distension. There is no tenderness. There is no rebound.  Musculoskeletal: Normal range of motion. He exhibits no edema or deformity.  L knee (posterior aspect) is very tender  Neurological: He is alert and oriented to person, place, and time. No cranial nerve deficit. Coordination normal.  Skin: Skin is warm and dry. No rash noted. No erythema.  Psychiatric: He has a normal mood and affect. His behavior is normal. Thought  content normal.  Nursing note and vitals reviewed.   Adult ECG Report  Rate: 52 ;  Rhythm: sinus bradycardia and otherwise normal axis, intervals & durations;   Narrative Interpretation: stable EKG   Other studies Reviewed: Additional studies/ records that were reviewed today include:  Recent Labs:   Lab Results  Component Value Date   CHOL 148 10/10/2016   HDL 22 (L) 10/10/2016   LDLCALC Comment 10/10/2016   TRIG 470 (H) 10/10/2016   CHOLHDL 6.7 (H) 10/10/2016    Lab Results  Component Value Date   CREATININE 1.04 10/10/2016   BUN 19 10/10/2016   NA 139 10/10/2016   K 3.9 10/10/2016   CL 101 10/10/2016   CO2 23 10/10/2016    ASSESSMENT / PLAN: Problem List Items Addressed This Visit    Bradycardia    Bradycardic on EKG here today. He does not  release symptoms that would suggest chronotropic incompetence. Sinus bradycardia itself is relatively benign. However this precluded the use of beta blockers or non-dihydropyridine calcium channel blockers.      Relevant Orders   EKG 12-Lead   ECHOCARDIOGRAM COMPLETE   Essential hypertension    Relatively well-control Maxzide/HCTZ. For now as long as his valvular lesion is not seen to be significant, would simply treat conservatively. Since he does have some dizziness, would opt to allow for mild permissive hypertension unless the valve lesion would warrant more aggressive control.      Relevant Orders   EKG 12-Lead   ECHOCARDIOGRAM COMPLETE   Systolic murmur - Primary    Holosystolic murmur would suggest a mitral valve murmur, however it is not an exceedingly valve murmur. I have ordered an echocardiogram to assess. I don't suspect to be a significant valvular lesion, but we need to confirm. He thankfully is not overly symptomatic. If there is evidence of mitral regurgitation, it would argue for slightly more aggressive blood pressure management.      Relevant Orders   EKG 12-Lead   ECHOCARDIOGRAM COMPLETE       Current medicines are reviewed at length with the patient today. (+/- concerns) n/a The following changes have been made: n/a  Patient Instructions  SCHEDULE AT [place] [facility] has requested that you have an echocardiogram. Echocardiography is a painless test that uses sound waves to create images of your heart. It provides your doctor with information about the size and shape of your heart and how well your heart's chambers and valves are working. This procedure takes approximately one hour. There are no restrictions for this procedure.  NO CHANGE WITH CURRENT MEDICATIONS    Your physician recommends that you schedule a follow-up appointment in 1 -2 WEEKS AFTER ECHO WITH DR Samit Sylve.     Studies Ordered:   Orders Placed This Encounter  Procedures  . EKG 12-Lead  . ECHOCARDIOGRAM COMPLETE      Glenetta Hew, M.D., M.S. Interventional Cardiologist   Pager # [phone] Phone # [phone] [address] [place], [place] [address]

## 2016-12-01 ENCOUNTER — Encounter: Payer: Self-pay | Admitting: Cardiology

## 2016-12-01 DIAGNOSIS — R001 Bradycardia, unspecified: Secondary | ICD-10-CM | POA: Insufficient documentation

## 2016-12-01 DIAGNOSIS — R011 Cardiac murmur, unspecified: Secondary | ICD-10-CM | POA: Insufficient documentation

## 2016-12-01 NOTE — Assessment & Plan Note (Signed)
Relatively well-control Maxzide/HCTZ. For now as long as his valvular lesion is not seen to be significant, would simply treat conservatively. Since he does have some dizziness, would opt to allow for mild permissive hypertension unless the valve lesion would warrant more aggressive control.

## 2016-12-01 NOTE — Assessment & Plan Note (Signed)
Holosystolic murmur would suggest a mitral valve murmur, however it is not an exceedingly valve murmur. I have ordered an echocardiogram to assess. I don't suspect to be a significant valvular lesion, but we need to confirm. He thankfully is not overly symptomatic. If there is evidence of mitral regurgitation, it would argue for slightly more aggressive blood pressure management.

## 2016-12-01 NOTE — Assessment & Plan Note (Signed)
Bradycardic on EKG here today. He does not release symptoms that would suggest chronotropic incompetence. Sinus bradycardia itself is relatively benign. However this precluded the use of beta blockers or non-dihydropyridine calcium channel blockers.

## 2016-12-02 ENCOUNTER — Telehealth: Payer: Self-pay | Admitting: Cardiology

## 2016-12-02 ENCOUNTER — Ambulatory Visit (INDEPENDENT_AMBULATORY_CARE_PROVIDER_SITE_OTHER): Payer: Medicare Other | Admitting: Ophthalmology

## 2016-12-02 DIAGNOSIS — H43813 Vitreous degeneration, bilateral: Secondary | ICD-10-CM | POA: Diagnosis not present

## 2016-12-02 DIAGNOSIS — H59032 Cystoid macular edema following cataract surgery, left eye: Secondary | ICD-10-CM

## 2016-12-02 DIAGNOSIS — H35033 Hypertensive retinopathy, bilateral: Secondary | ICD-10-CM | POA: Diagnosis not present

## 2016-12-02 DIAGNOSIS — I10 Essential (primary) hypertension: Secondary | ICD-10-CM

## 2016-12-02 DIAGNOSIS — H353131 Nonexudative age-related macular degeneration, bilateral, early dry stage: Secondary | ICD-10-CM

## 2016-12-02 NOTE — Telephone Encounter (Signed)
Left message for patient to call to schedule echo and and follow up appt with Dr. Ellyn Hack 1-2 weeks after the echo.

## 2016-12-05 ENCOUNTER — Telehealth: Payer: Self-pay | Admitting: Cardiology

## 2016-12-05 NOTE — Telephone Encounter (Signed)
Left msg for patient to call back to schedule a follow up appt with Dr. Ellyn Hack - next available.  He has an echo scheduled.

## 2016-12-09 ENCOUNTER — Other Ambulatory Visit: Payer: Self-pay

## 2016-12-09 ENCOUNTER — Ambulatory Visit (HOSPITAL_COMMUNITY): Payer: Medicare Other | Attending: Cardiovascular Disease

## 2016-12-09 DIAGNOSIS — I10 Essential (primary) hypertension: Secondary | ICD-10-CM | POA: Diagnosis not present

## 2016-12-09 DIAGNOSIS — R001 Bradycardia, unspecified: Secondary | ICD-10-CM | POA: Diagnosis not present

## 2016-12-09 DIAGNOSIS — I082 Rheumatic disorders of both aortic and tricuspid valves: Secondary | ICD-10-CM | POA: Insufficient documentation

## 2016-12-09 DIAGNOSIS — Z87891 Personal history of nicotine dependence: Secondary | ICD-10-CM | POA: Diagnosis not present

## 2016-12-09 DIAGNOSIS — R011 Cardiac murmur, unspecified: Secondary | ICD-10-CM | POA: Insufficient documentation

## 2017-01-04 ENCOUNTER — Other Ambulatory Visit: Payer: Self-pay | Admitting: Family Medicine

## 2017-01-07 ENCOUNTER — Telehealth: Payer: Self-pay | Admitting: Family Medicine

## 2017-01-07 NOTE — Telephone Encounter (Signed)
Patient aware that there has not been any changes to potassium

## 2017-01-31 ENCOUNTER — Other Ambulatory Visit: Payer: Self-pay | Admitting: Family Medicine

## 2017-03-10 ENCOUNTER — Other Ambulatory Visit: Payer: Self-pay | Admitting: Family Medicine

## 2017-04-09 ENCOUNTER — Other Ambulatory Visit: Payer: Self-pay | Admitting: Family Medicine

## 2017-04-21 ENCOUNTER — Other Ambulatory Visit: Payer: Self-pay | Admitting: Family Medicine

## 2017-04-23 ENCOUNTER — Telehealth: Payer: Self-pay | Admitting: Family Medicine

## 2017-04-23 NOTE — Telephone Encounter (Signed)
Last seen 10/28/16  Dr Livia Snellen

## 2017-04-23 NOTE — Telephone Encounter (Signed)
ntbs- apt made 05/09/17

## 2017-04-23 NOTE — Telephone Encounter (Signed)
Patient wants to know why his amLODipine (NORVASC) 10 MG tablet was cut back to #30 instead of #90. Please advise.

## 2017-04-23 NOTE — Telephone Encounter (Signed)
Authorize 30 days only. Then contact the patient letting them know that they will need an appointment before any further prescriptions can be sent in. 

## 2017-04-23 NOTE — Telephone Encounter (Signed)
Left detailed message per dpr  

## 2017-05-06 ENCOUNTER — Other Ambulatory Visit: Payer: Self-pay | Admitting: Family Medicine

## 2017-05-09 ENCOUNTER — Encounter: Payer: Self-pay | Admitting: Family Medicine

## 2017-05-09 ENCOUNTER — Ambulatory Visit (INDEPENDENT_AMBULATORY_CARE_PROVIDER_SITE_OTHER): Payer: Medicare Other | Admitting: Family Medicine

## 2017-05-09 VITALS — BP 136/58 | HR 61 | Temp 97.0°F | Ht 68.5 in | Wt 203.0 lb

## 2017-05-09 DIAGNOSIS — D7589 Other specified diseases of blood and blood-forming organs: Secondary | ICD-10-CM | POA: Diagnosis not present

## 2017-05-09 DIAGNOSIS — I10 Essential (primary) hypertension: Secondary | ICD-10-CM

## 2017-05-09 DIAGNOSIS — G8929 Other chronic pain: Secondary | ICD-10-CM | POA: Diagnosis not present

## 2017-05-09 DIAGNOSIS — M25562 Pain in left knee: Secondary | ICD-10-CM | POA: Diagnosis not present

## 2017-05-09 MED ORDER — PREDNISOLONE ACETATE 1 % OP SUSP: 1.0000 [drp] | Freq: Four times a day (QID) | OPHTHALMIC | 1 refills | Status: DC

## 2017-05-09 MED ORDER — AMLODIPINE BESYLATE 10 MG PO TABS: 10.0000 mg | ORAL_TABLET | Freq: Every day | ORAL | 0 refills | Status: DC

## 2017-05-09 MED ORDER — TRIAMTERENE-HCTZ 37.5-25 MG PO TABS
1.0000 | ORAL_TABLET | Freq: Every day | ORAL | 3 refills | Status: DC
Start: 2017-05-09 — End: 2018-04-13

## 2017-05-09 MED ORDER — DICLOFENAC SODIUM 75 MG PO TBEC
DELAYED_RELEASE_TABLET | ORAL | 1 refills | Status: DC
Start: 2017-05-09 — End: 2017-11-06

## 2017-05-09 NOTE — Patient Instructions (Signed)
Take diclofenac 2 times a day with food for arthritis.  Do not take Ibuprofen, aspirin or similar medications while taking diclofenac.  You can take tylenol as needed.

## 2017-05-09 NOTE — Progress Notes (Signed)
 Subjective:  Patient ID: Brett Blankenship, male    DOB: 11/08/1926  Age: 84 y.o. MRN: [id number]  CC: Hypertension (pt here today for routine follow up of his chronic medical conditions)   HPI Brett Blankenship presents for  follow-up of hypertension. Patient has no history of headache chest pain or shortness of breath or recent cough. Patient also denies symptoms of TIA such as focal numbness or weakness.. Patient denies side effects from medication. States taking it regularly.  These include Maxide 25 1 daily and amlodipine 10 mg daily he had a low potassium in the past and has been taking a supplement.  However he was recently switched over from hydrochlorothiazide to the Maxzide and wants to know if he can drop off the potassium.  He continues to take omega-3 fish oil capsules for his cholesterol and does not want to take a statin.   History Brett Blankenship has a past medical history of HYPERTENSION (07/12/2009).   He has a past surgical history that includes Appendectomy (1960) and Spine surgery (2004).   His family history includes Cancer in his brother; Hypertension in his father.He reports that he quit smoking about 53 years ago. His smoking use included cigarettes. He has a 1.50 pack-year smoking history. he has never used smokeless tobacco. His alcohol and drug histories are not on file.  Current Outpatient Medications on File Prior to Visit  Medication Sig Dispense Refill  . fish oil-omega-3 fatty acids 1000 MG capsule Take 2 g by mouth daily.      . Multiple Vitamins-Minerals (EYE VITAMINS & MINERALS PO) Take by mouth. [facility]    . Psyllium 30.9 % POWD Drink 1 tablespoon dissolved in water, Daily 1 Bottle 11   No current facility-administered medications on file prior to visit.     ROS Review of Systems  Constitutional: Negative for chills, diaphoresis, fever and unexpected weight change.  HENT: Negative for congestion, hearing loss, rhinorrhea and sore throat.   Eyes:  Negative for visual disturbance.  Respiratory: Negative for cough and shortness of breath.   Cardiovascular: Negative for chest pain.  Gastrointestinal: Negative for abdominal pain, constipation and diarrhea.  Genitourinary: Negative for dysuria and flank pain.  Musculoskeletal: Negative for arthralgias and joint swelling.  Skin: Negative for rash.  Neurological: Negative for dizziness and headaches.  Psychiatric/Behavioral: Negative for dysphoric mood and sleep disturbance.    Objective:  BP (!) 136/58   Pulse 61   Temp (!) 97 F (36.1 C) (Oral)   Ht 5' 8.5" (1.74 m)   Wt 203 lb (92.1 kg)   BMI 30.42 kg/m   BP Readings from Last 3 Encounters:  05/09/17 (!) 136/58  11/29/16 (!) 149/68  10/28/16 (!) 132/58    Wt Readings from Last 3 Encounters:  05/09/17 203 lb (92.1 kg)  11/29/16 198 lb 3.2 oz (89.9 kg)  10/28/16 200 lb (90.7 kg)     Physical Exam  Constitutional: He is oriented to person, place, and time. He appears well-developed and well-nourished. No distress.  HENT:  Head: Normocephalic and atraumatic.  Right Ear: External ear normal.  Left Ear: External ear normal.  Nose: Nose normal.  Mouth/Throat: Oropharynx is clear and moist.  Eyes: Conjunctivae and EOM are normal. Pupils are equal, round, and reactive to light.  Neck: Normal range of motion. Neck supple. No thyromegaly present.  Cardiovascular: Normal rate, regular rhythm and normal heart sounds.  No murmur heard. Pulmonary/Chest: Effort normal and breath sounds normal. No respiratory distress. He has   no wheezes. He has no rales.  Abdominal: Soft. Bowel sounds are normal. He exhibits no distension. There is no tenderness.  Lymphadenopathy:    He has no cervical adenopathy.  Neurological: He is alert and oriented to person, place, and time. He has normal reflexes.  Skin: Skin is warm and dry.  Psychiatric: He has a normal mood and affect. His behavior is normal. Judgment and thought content normal.       Assessment & Plan:   Brett Blankenship was seen today for hypertension.  Diagnoses and all orders for this visit:  Essential hypertension -     CBC with Differential/Platelet -     CMP14+EGFR  Chronic pain of left knee  Other orders -     amLODipine (NORVASC) 10 MG tablet; Take 1 tablet (10 mg total) by mouth daily. -     diclofenac (VOLTAREN) 75 MG EC tablet; TAKE (1) TABLET BY MOUTH TWICE DAILY FOR MUSCLE AND JOINT PAIN. -     prednisoLONE acetate (PRED FORTE) 1 % ophthalmic suspension; Place 1 drop into the left eye 4 (four) times daily. -     triamterene-hydrochlorothiazide (MAXZIDE-25) 37.5-25 MG tablet; Take 1 tablet by mouth daily. For blood pressure and fluid -     B12 and Folate Panel -     Specimen status report   Allergies as of 05/09/2017   No Known Allergies     Medication List        Accurate as of 05/09/17 11:59 PM. Always use your most recent med list.          amLODipine 10 MG tablet Commonly known as:  NORVASC Take 1 tablet (10 mg total) by mouth daily.   diclofenac 75 MG EC tablet Commonly known as:  VOLTAREN TAKE (1) TABLET BY MOUTH TWICE DAILY FOR MUSCLE AND JOINT PAIN.   EYE VITAMINS & MINERALS PO Take by mouth. [facility]   fish oil-omega-3 fatty acids 1000 MG capsule Take 2 g by mouth daily.   prednisoLONE acetate 1 % ophthalmic suspension Commonly known as:  PRED FORTE Place 1 drop into the left eye 4 (four) times daily.   Psyllium 30.9 % Powd Drink 1 tablespoon dissolved in water, Daily   triamterene-hydrochlorothiazide 37.5-25 MG tablet Commonly known as:  MAXZIDE-25 Take 1 tablet by mouth daily. For blood pressure and fluid       Meds ordered this encounter  Medications  . amLODipine (NORVASC) 10 MG tablet    Sig: Take 1 tablet (10 mg total) by mouth daily.    Dispense:  30 tablet    Refill:  0  . diclofenac (VOLTAREN) 75 MG EC tablet    Sig: TAKE (1) TABLET BY MOUTH TWICE DAILY FOR MUSCLE AND JOINT PAIN.    Dispense:   180 tablet    Refill:  1  . prednisoLONE acetate (PRED FORTE) 1 % ophthalmic suspension    Sig: Place 1 drop into the left eye 4 (four) times daily.    Dispense:  5 mL    Refill:  1  . triamterene-hydrochlorothiazide (MAXZIDE-25) 37.5-25 MG tablet    Sig: Take 1 tablet by mouth daily. For blood pressure and fluid    Dispense:  90 tablet    Refill:  3    That Should be more effective for knee pain.  However you can take Tylenol but avoid aspirin and Aleve and ibuprofen products  Follow-up: Return in about 6 months (around 11/06/2017).  Claretta Fraise, M.D.

## 2017-05-10 LAB — CMP14+EGFR
A/G RATIO: 1 — AB (ref 1.2–2.2)
ALT: 10 IU/L (ref 0–44)
AST: 11 IU/L (ref 0–40)
Albumin: 3.7 g/dL (ref 3.5–4.7)
Alkaline Phosphatase: 58 IU/L (ref 39–117)
BUN/Creatinine Ratio: 18 (ref 10–24)
BUN: 17 mg/dL (ref 8–27)
Bilirubin Total: <0.2 mg/dL (ref 0.0–1.2)
CALCIUM: 9.4 mg/dL (ref 8.6–10.2)
CO2: 24 mmol/L (ref 20–29)
Chloride: 101 mmol/L (ref 96–106)
Creatinine, Ser: 0.93 mg/dL (ref 0.76–1.27)
GFR, EST AFRICAN AMERICAN: 84 mL/min/{1.73_m2} (ref >59)
GFR, EST NON AFRICAN AMERICAN: 73 mL/min/{1.73_m2} (ref >59)
GLOBULIN, TOTAL: 3.6 g/dL (ref 1.5–4.5)
Glucose: 89 mg/dL (ref 65–99)
POTASSIUM: 4.1 mmol/L (ref 3.5–5.2)
SODIUM: 140 mmol/L (ref 134–144)
TOTAL PROTEIN: 7.3 g/dL (ref 6.0–8.5)

## 2017-05-10 LAB — CBC WITH DIFFERENTIAL/PLATELET
BASOS: 1 %
Basophils Absolute: 0.1 10*3/uL (ref 0.0–0.2)
EOS (ABSOLUTE): 0.1 10*3/uL (ref 0.0–0.4)
Eos: 2 %
HEMATOCRIT: 35.2 % — AB (ref 37.5–51.0)
Hemoglobin: 12.2 g/dL — ABNORMAL LOW (ref 13.0–17.7)
IMMATURE GRANS (ABS): 0 10*3/uL (ref 0.0–0.1)
IMMATURE GRANULOCYTES: 0 %
LYMPHS: 36 %
Lymphocytes Absolute: 2.5 10*3/uL (ref 0.7–3.1)
MCH: 34.4 pg — ABNORMAL HIGH (ref 26.6–33.0)
MCHC: 34.7 g/dL (ref 31.5–35.7)
MCV: 99 fL — AB (ref 79–97)
MONOCYTES: 8 %
Monocytes Absolute: 0.6 10*3/uL (ref 0.1–0.9)
NEUTROS PCT: 53 %
Neutrophils Absolute: 3.8 10*3/uL (ref 1.4–7.0)
Platelets: 155 10*3/uL (ref 150–379)
RBC: 3.55 x10E6/uL — AB (ref 4.14–5.80)
RDW: 14.6 % (ref 12.3–15.4)
WBC: 7 10*3/uL (ref 3.4–10.8)

## 2017-05-14 LAB — B12 AND FOLATE PANEL
FOLATE: 18.3 ng/mL (ref >3.0)
VITAMIN B 12: 223 pg/mL — AB (ref 232–1245)

## 2017-05-14 LAB — SPECIMEN STATUS REPORT

## 2017-05-15 ENCOUNTER — Other Ambulatory Visit: Payer: Self-pay | Admitting: *Deleted

## 2017-05-15 ENCOUNTER — Encounter: Payer: Self-pay | Admitting: Family Medicine

## 2017-05-15 ENCOUNTER — Telehealth: Payer: Self-pay | Admitting: *Deleted

## 2017-05-15 DIAGNOSIS — E538 Deficiency of other specified B group vitamins: Secondary | ICD-10-CM

## 2017-05-15 NOTE — Telephone Encounter (Signed)
Pleas he should do well without the potassium.  Go ahead and discontinue it.  However we should check a potassium level in a few weeks.  Perhaps at the same time we will recheck his vitamin B12 level.

## 2017-05-15 NOTE — Telephone Encounter (Signed)
Son states that patient told him that you were going to see if he can stop potassium? Son wanted to verify if he needs to continue potassium or can he d/c?

## 2017-05-16 NOTE — Telephone Encounter (Signed)
Aware of doctor's advice.

## 2017-05-19 ENCOUNTER — Other Ambulatory Visit: Payer: Self-pay | Admitting: Family Medicine

## 2017-05-19 DIAGNOSIS — I10 Essential (primary) hypertension: Secondary | ICD-10-CM

## 2017-06-04 ENCOUNTER — Encounter (INDEPENDENT_AMBULATORY_CARE_PROVIDER_SITE_OTHER): Payer: Medicare Other | Admitting: Ophthalmology

## 2017-06-04 DIAGNOSIS — H33302 Unspecified retinal break, left eye: Secondary | ICD-10-CM

## 2017-06-04 DIAGNOSIS — H353122 Nonexudative age-related macular degeneration, left eye, intermediate dry stage: Secondary | ICD-10-CM

## 2017-06-04 DIAGNOSIS — H353111 Nonexudative age-related macular degeneration, right eye, early dry stage: Secondary | ICD-10-CM

## 2017-06-04 DIAGNOSIS — H43813 Vitreous degeneration, bilateral: Secondary | ICD-10-CM | POA: Diagnosis not present

## 2017-06-04 DIAGNOSIS — H59032 Cystoid macular edema following cataract surgery, left eye: Secondary | ICD-10-CM

## 2017-06-04 DIAGNOSIS — I10 Essential (primary) hypertension: Secondary | ICD-10-CM

## 2017-06-04 DIAGNOSIS — H35033 Hypertensive retinopathy, bilateral: Secondary | ICD-10-CM

## 2017-06-19 ENCOUNTER — Other Ambulatory Visit: Payer: Medicare Other

## 2017-06-19 DIAGNOSIS — E538 Deficiency of other specified B group vitamins: Secondary | ICD-10-CM | POA: Diagnosis not present

## 2017-06-20 LAB — VITAMIN B12: VITAMIN B 12: 508 pg/mL (ref 232–1245)

## 2017-06-23 ENCOUNTER — Other Ambulatory Visit: Payer: Self-pay | Admitting: Family Medicine

## 2017-06-23 DIAGNOSIS — I10 Essential (primary) hypertension: Secondary | ICD-10-CM

## 2017-07-23 ENCOUNTER — Encounter: Payer: Self-pay | Admitting: Family Medicine

## 2017-07-23 ENCOUNTER — Ambulatory Visit (INDEPENDENT_AMBULATORY_CARE_PROVIDER_SITE_OTHER): Payer: Medicare Other | Admitting: Family Medicine

## 2017-07-23 VITALS — BP 129/64 | HR 53 | Temp 98.1°F | Ht 68.5 in | Wt 201.0 lb

## 2017-07-23 DIAGNOSIS — R220 Localized swelling, mass and lump, head: Secondary | ICD-10-CM

## 2017-07-23 DIAGNOSIS — T783XXA Angioneurotic edema, initial encounter: Secondary | ICD-10-CM | POA: Diagnosis not present

## 2017-07-23 MED ORDER — METHYLPREDNISOLONE ACETATE 80 MG/ML IJ SUSP
80.0000 mg | Freq: Once | INTRAMUSCULAR | Status: AC
  Administered 2017-07-23: 80 mg via INTRAMUSCULAR

## 2017-07-23 MED ORDER — CETIRIZINE HCL 10 MG PO TABS: 10.0000 mg | ORAL_TABLET | Freq: Every day | ORAL | 0 refills | Status: DC

## 2017-07-23 NOTE — Patient Instructions (Signed)
This appears to be allergy mediated.  There is no evidence of anaphylaxis on your exam today.  I have given you a dose of Depo-Medrol IM 80 mg here in office (this is a steroid) to help reduce your allergy.  I have also prescribed Zyrtec free to take daily for the next 3 days.  You may also add Benadryl 25 mg if you have persistent symptoms.  However, Benadryl sometimes can cause excess dryness and difficulty urinating.  If you decide to use this medication and have the side effects, please follow-up here in office.  As we discussed, if your symptoms get worse, you develop throat irritation, feel like your airway is closing up, shortness of breath, nausea, please seek immediate medical attention in the emergency department.   Angioedema Angioedema is the sudden swelling of tissue in the body. Angioedema can affect any part of the body, but it most often affects the deeper parts of the skin, causing red, itchy patches (hives) to appear over the affected area. It often begins during the night and is found in the morning. Depending on the cause, angioedema may happen:  Only once.  Several times. It may come back in unpredictable patterns.  Repeatedly for several years. Over time, it may gradually stop coming back.  Angioedema can be life-threatening if it affects the air passages that you breathe through. What are the causes? This condition may be caused by:  Foods, such as milk, eggs, shellfish, wheat, or nuts.  Certain medicines, such as ACE inhibitors, antibiotics, nonsteroidal anti-inflammatory drugs, birth control pills, or dyes used in X-rays.  Insect stings.  Infections.  Angioedema can be inherited, and episodes can be triggered by:  Mild injury.  Dental work.  Surgery.  Stress.  Sudden changes in temperature.  Exercise.  In some cases, the cause of this condition is not known. What are the signs or symptoms? Symptoms of this condition depend on where the swelling  happens. Symptoms may include:  Swollen skin.  Red, itchy patches of skin (hives).  Redness in the affected area.  Pain in the affected area.  Swollen lips or tongue.  Wheezing.  Breathing problems.  If your internal organs are involved, symptoms may also include:  Nausea.  Abdominal pain.  Vomiting.  Difficulty swallowing.  Difficulty passing urine.  How is this diagnosed? This condition may be diagnosed based on:  An exam of the affected area.  Your medical history.  Whether anyone in your family has had this condition before.  A review of any medicines you have been taking.  Tests, including: ? Allergy skin tests to see if the condition was caused by an allergic reaction. ? Blood tests to see if the condition was caused by a gene. ? Tests to check for underlying diseases that could cause the condition.  How is this treated? Treatment for this condition depends on the cause. It may involve any of the following:  If something triggered the condition, making changes to keep it from triggering the condition again.  If the condition affects your breathing, having tubes placed in your airway to keep it open.  Taking medicines to treat symptoms or prevent future episodes. These may include: ? Antihistamines. ? Epinephrine injections. ? Steroids.  If your condition is severe, you may need to be treated at the hospital. Angioedema usually gets better in 24-48 hours. Follow these instructions at home:  Take over-the-counter and prescription medicines only as told by your health care provider.  If you were  given medicines for emergency allergy treatment, always carry them with you.  Wear a medical bracelet as told by your health care provider.  If something triggers your condition, avoid the trigger, if possible.  If your condition is inherited and you are thinking about having children, talk to your health care provider. It is important to discuss the risks  of passing on the condition to your children. Contact a health care provider if:  You have repeated episodes of angioedema.  Episodes of angioedema start to happen more often than they used to, even after you take steps to prevent them.  You have episodes of angioedema that are more severe than they have been before, even after you take steps to prevent them.  You are thinking about having children. Get help right away if:  You have severe swelling of your mouth, tongue, or lips.  You have trouble breathing.  You have trouble swallowing.  You faint. This information is not intended to replace advice given to you by your health care provider. Make sure you discuss any questions you have with your health care provider. Document Released: 06/24/2001 Document Revised: 11/11/2015 Document Reviewed: 10/24/2015 Elsevier Interactive Patient Education  [employer].

## 2017-07-23 NOTE — Progress Notes (Signed)
Subjective: CC: Facial swelling PCP: Claretta Fraise, MD [id number] Brett Blankenship is a 84 y.o. male presenting to clinic today for:  1. Facial swelling Patient reports that he developed upper lip swelling and irritation this morning.  He notes that this occurred after he ate breakfast and thought perhaps he had had leftover shrimp shell caught between his teeth or gums from yesterday's meal.  He reports that he gargled and tried to massage the gumline to see if this would improve.  His upper lip swelling seemed to get worse and after being encouraged by his son, he came in for evaluation.  Patient denies throat itching or irritation.  He denies shortness of breath or sensation of throat closing.  No nausea, vomiting, dizziness.  No wheezing, chest pain.  No swelling elsewhere.  Upper lip is not painful but it does feel "tight".  He has not taken any medications to assist with swelling, including Benadryl.  In the last couple of days, he has not used any oral NSAIDs that he can recall.  He does not recall being stung or bitten by any insects.  No new medications.  He is not on any ACE inhibitors or ARBs.  He feels his normal self except for the swelling of his upper lip.  Patient notes that he has never had an allergic reaction to anything before.  No history of shellfish allergy or allergy to medications.   ROS: Per HPI  No Known Allergies Past Medical History:  Diagnosis Date  . HYPERTENSION 07/12/2009    Current Outpatient Medications:  .  amLODipine (NORVASC) 10 MG tablet, TAKE ONE TABLET BY MOUTH ONCE DAILY., Disp: 30 tablet, Rfl: 4 .  diclofenac (VOLTAREN) 75 MG EC tablet, TAKE (1) TABLET BY MOUTH TWICE DAILY FOR MUSCLE AND JOINT PAIN., Disp: 180 tablet, Rfl: 1 .  fish oil-omega-3 fatty acids 1000 MG capsule, Take 2 g by mouth daily.  , Disp: , Rfl:  .  Multiple Vitamins-Minerals (EYE VITAMINS & MINERALS PO), Take by mouth. [facility], Disp: , Rfl:  .  prednisoLONE acetate (PRED FORTE) 1 %  ophthalmic suspension, Place 1 drop into the left eye 4 (four) times daily., Disp: 5 mL, Rfl: 1 .  Psyllium 30.9 % POWD, Drink 1 tablespoon dissolved in water, Daily, Disp: 1 Bottle, Rfl: 11 .  triamterene-hydrochlorothiazide (MAXZIDE-25) 37.5-25 MG tablet, Take 1 tablet by mouth daily. For blood pressure and fluid, Disp: 90 tablet, Rfl: 3 Social History   Socioeconomic History  . Marital status: Married    Spouse name: Not on file  . Number of children: Not on file  . Years of education: Not on file  . Highest education level: Not on file  Occupational History  . Not on file  Social Needs  . Financial resource strain: Not on file  . Food insecurity:    Worry: Not on file    Inability: Not on file  . Transportation needs:    Medical: Not on file    Non-medical: Not on file  Tobacco Use  . Smoking status: Former Smoker    Packs/day: 0.30    Years: 5.00    Pack years: 1.50    Types: Cigarettes    Last attempt to quit: 09/12/1963    Years since quitting: 53.8  . Smokeless tobacco: Never Used  Substance and Sexual Activity  . Alcohol use: Not on file  . Drug use: Not on file  . Sexual activity: Not on file  Lifestyle  . Physical activity:  Days per week: Not on file    Minutes per session: Not on file  . Stress: Not on file  Relationships  . Social connections:    Talks on phone: Not on file    Gets together: Not on file    Attends religious service: Not on file    Active member of club or organization: Not on file    Attends meetings of clubs or organizations: Not on file    Relationship status: Not on file  . Intimate partner violence:    Fear of current or ex partner: Not on file    Emotionally abused: Not on file    Physically abused: Not on file    Forced sexual activity: Not on file  Other Topics Concern  . Not on file  Social History Narrative  . Not on file   Family History  Problem Relation Age of Onset  . Hypertension Father   . Cancer Brother         colon, lung    Objective: Office vital signs reviewed. BP 129/64   Pulse (!) 53   Temp 98.1 Brett (36.7 C) (Oral)   Ht 5' 8.5" (1.74 m)   Wt 201 lb (91.2 kg)   SpO2 99%   BMI 30.12 kg/m   Physical Examination:  General: Awake, alert, well nourished, nontoxic appearing elderly male, No acute distress HEENT: gross swelling of upper lip noted.    Neck: No masses palpated. No lymphadenopathy    Ears: Tympanic membranes intact, normal light reflex, no erythema, no bulging    Eyes: PERRLA, extraocular membranes intact, sclera white    Nose: nasal turbinates moist, no nasal discharge    Throat: Patient is able to open his mouth fully.  Moist mucus membranes, no erythema, no sublingual edema.  Airway is patent Cardio: Slightly bradycardic with regular rhythm, S1S2 heard, no murmurs appreciated Pulm: clear to auscultation bilaterally, no wheezes, rhonchi or rales; normal work of breathing on room air Skin: Moderate swelling of the entire upper lip noted.  No rashes appreciated.  Assessment/ Plan: 84 y.o. male   1. Allergic angioedema, initial encounter I suspect an allergic angioedema, possibly related to shellfish, as he had no other known exposures.  No evidence of anaphylaxis on today's exam.  He has got a normal cardiopulmonary exam with normal work of breathing and oxygen saturation on room air.  It appears that he is at baseline bradycardic.  Patient is completely asymptomatic except for upper lip swelling.  He was treated with a dose of Depo-Medrol 80 mg here in office.  I prescribed him an oral H1 blocker.  Okay to add Benadryl 25 mg every 6 hours if needed but I did caution him that given his age this may cause urinary retention and excessive dryness.  He voiced good understanding.  Home care instructions were reviewed.  Details of today's appointment also placed in AVS for his son's review.  Reasons for emergent evaluation in the emergency department discussed.  He voiced good  understanding and will follow-up as needed. - methylPREDNISolone acetate (DEPO-MEDROL) injection 80 mg   Meds ordered this encounter  Medications  . cetirizine (ZYRTEC) 10 MG tablet    Sig: Take 1 tablet (10 mg total) by mouth daily for 3 days.    Dispense:  5 tablet    Refill:  0  . methylPREDNISolone acetate (DEPO-MEDROL) injection 80 mg     Janora Norlander, DO [place] [phone]

## 2017-11-06 ENCOUNTER — Encounter: Payer: Self-pay | Admitting: Family Medicine

## 2017-11-06 ENCOUNTER — Ambulatory Visit (INDEPENDENT_AMBULATORY_CARE_PROVIDER_SITE_OTHER): Payer: Medicare Other | Admitting: Family Medicine

## 2017-11-06 ENCOUNTER — Ambulatory Visit (INDEPENDENT_AMBULATORY_CARE_PROVIDER_SITE_OTHER): Payer: Medicare Other

## 2017-11-06 VITALS — BP 138/58 | HR 69 | Temp 99.0°F | Ht 68.5 in | Wt 206.2 lb

## 2017-11-06 DIAGNOSIS — E782 Mixed hyperlipidemia: Secondary | ICD-10-CM

## 2017-11-06 DIAGNOSIS — R05 Cough: Secondary | ICD-10-CM | POA: Diagnosis not present

## 2017-11-06 DIAGNOSIS — G8929 Other chronic pain: Secondary | ICD-10-CM | POA: Diagnosis not present

## 2017-11-06 DIAGNOSIS — I10 Essential (primary) hypertension: Secondary | ICD-10-CM

## 2017-11-06 DIAGNOSIS — R0989 Other specified symptoms and signs involving the circulatory and respiratory systems: Secondary | ICD-10-CM

## 2017-11-06 DIAGNOSIS — M25562 Pain in left knee: Secondary | ICD-10-CM

## 2017-11-06 MED ORDER — DICLOFENAC SODIUM 75 MG PO TBEC: DELAYED_RELEASE_TABLET | ORAL | 1 refills | Status: DC

## 2017-11-06 MED ORDER — AMLODIPINE BESYLATE 10 MG PO TABS: 10.0000 mg | ORAL_TABLET | Freq: Every day | ORAL | 1 refills | Status: DC

## 2017-11-06 NOTE — Progress Notes (Signed)
Subjective:  Patient ID: Brett Blankenship, male    DOB: Mar 23, 1927  Age: 84 y.o. MRN: [id number]  CC: Medical Management of Chronic Issues   HPI Brett Blankenship presents for follow-up of hypertension. Patient has no history of headache chest pain or shortness of breath or recent cough. Patient also denies symptoms of TIA such as numbness weakness lateralizing. Patient checks  blood pressure at home and has not had any elevated readings recently. Patient denies side effects from his medication. States taking it regularly. Patient says he is staying active gardening primarily the summer he is growing snap peas and has recently picked 10 bushels.  He just replanted two more Rows and pulled up his cucumbers because he did not like the variety that he had planted.  He prefers the burpless kind. Patient also has history of elevated cholesterol.  He does not take a statin.  Instead he prefers to manage that on his own with fish oil with omega-3 fatty acids he is taking 2 g a day.  He is due for to have that rechecked.  Depression screen [facility] 2/9 11/06/2017 07/23/2017 05/09/2017  Decreased Interest 0 0 0  Down, Depressed, Hopeless 0 0 0  PHQ - 2 Score 0 0 0    History Brett Blankenship has a past medical history of HYPERTENSION (07/12/2009).   He has a past surgical history that includes Appendectomy (4098) and Spine surgery (2004).   His family history includes Cancer in his brother; Hypertension in his father.He reports that he quit smoking about 54 years ago. His smoking use included cigarettes. He has a 1.50 pack-year smoking history. He has never used smokeless tobacco. His alcohol and drug histories are not on file.    ROS Review of Systems  Constitutional: Negative.   HENT: Negative.   Eyes: Negative for visual disturbance.  Respiratory: Negative for cough and shortness of breath.   Cardiovascular: Negative for chest pain and leg swelling.  Gastrointestinal: Negative for abdominal pain,  diarrhea, nausea and vomiting.  Genitourinary: Negative for difficulty urinating.  Musculoskeletal: Positive for arthralgias (Left knee). Negative for myalgias.  Skin: Negative for rash.  Neurological: Negative for headaches.  Psychiatric/Behavioral: Negative for sleep disturbance.    Objective:  BP (!) 138/58   Pulse 69   Temp 99 F (37.2 C) (Oral)   Ht 5' 8.5" (1.74 m)   Wt 206 lb 4 oz (93.6 kg)   BMI 30.90 kg/m   BP Readings from Last 3 Encounters:  11/06/17 (!) 138/58  07/23/17 129/64  05/09/17 (!) 136/58    Wt Readings from Last 3 Encounters:  11/06/17 206 lb 4 oz (93.6 kg)  07/23/17 201 lb (91.2 kg)  05/09/17 203 lb (92.1 kg)     Physical Exam  Constitutional: He is oriented to person, place, and time. He appears well-developed and well-nourished. No distress.  HENT:  Head: Normocephalic and atraumatic.  Right Ear: External ear normal.  Left Ear: External ear normal.  Nose: Nose normal.  Mouth/Throat: Oropharynx is clear and moist.  Eyes: Pupils are equal, round, and reactive to light. Conjunctivae and EOM are normal.  Neck: Normal range of motion. Neck supple.  Cardiovascular: Normal rate, regular rhythm and normal heart sounds.  No murmur heard. Pulmonary/Chest: Effort normal. No respiratory distress. He has no wheezes. He has rales (At left base).  Abdominal: Soft. There is no tenderness.  Musculoskeletal: Normal range of motion.  Neurological: He is alert and oriented to person, place, and time. He has  normal reflexes.  Skin: Skin is warm and dry.  Psychiatric: He has a normal mood and affect. His behavior is normal. Judgment and thought content normal.   Chest x-ray: Comparison with January 2018 shows no change.  There is some evidence for old scarring possibly atelectasis.  No acute change.   Assessment & Plan:   Brett Blankenship was seen today for medical management of chronic issues.  Diagnoses and all orders for this visit:  Essential hypertension -      amLODipine (NORVASC) 10 MG tablet; Take 1 tablet (10 mg total) by mouth daily.  Rales -     DG Chest 2 View; Future  Chronic pain of left knee -     diclofenac (VOLTAREN) 75 MG EC tablet; TAKE (1) TABLET BY MOUTH TWICE DAILY FOR MUSCLE AND JOINT PAIN.  Hyperlipidemia, mixed -     CBC with Differential/Platelet -     CMP14+EGFR -     Lipid panel       I have changed Brett Blankenship amLODipine. I am also having him maintain his fish oil-omega-3 fatty acids, Multiple Vitamins-Minerals (EYE VITAMINS & MINERALS PO), Psyllium, prednisoLONE acetate, triamterene-hydrochlorothiazide, cetirizine, and diclofenac.  Allergies as of 11/06/2017   No Known Allergies     Medication List        Accurate as of 11/06/17  5:13 PM. Always use your most recent med list.          amLODipine 10 MG tablet Commonly known as:  NORVASC Take 1 tablet (10 mg total) by mouth daily.   cetirizine 10 MG tablet Commonly known as:  ZYRTEC Take 1 tablet (10 mg total) by mouth daily for 3 days.   diclofenac 75 MG EC tablet Commonly known as:  VOLTAREN TAKE (1) TABLET BY MOUTH TWICE DAILY FOR MUSCLE AND JOINT PAIN.   EYE VITAMINS & MINERALS PO Take by mouth. [facility]   fish oil-omega-3 fatty acids 1000 MG capsule Take 2 g by mouth daily.   prednisoLONE acetate 1 % ophthalmic suspension Commonly known as:  PRED FORTE Place 1 drop into the left eye 4 (four) times daily.   Psyllium 30.9 % Powd Drink 1 tablespoon dissolved in water, Daily   triamterene-hydrochlorothiazide 37.5-25 MG tablet Commonly known as:  MAXZIDE-25 Take 1 tablet by mouth daily. For blood pressure and fluid        Follow-up: Return in about 6 months (around 05/09/2018).  Claretta Fraise, M.D.

## 2017-11-07 ENCOUNTER — Other Ambulatory Visit: Payer: Self-pay | Admitting: Family Medicine

## 2017-11-07 LAB — LIPID PANEL
CHOLESTEROL TOTAL: 133 mg/dL (ref 100–199)
Chol/HDL Ratio: 6.3 ratio — ABNORMAL HIGH (ref 0.0–5.0)
HDL: 21 mg/dL — AB (ref >39)
LDL Calculated: 35 mg/dL (ref 0–99)
Triglycerides: 387 mg/dL — ABNORMAL HIGH (ref 0–149)
VLDL CHOLESTEROL CAL: 77 mg/dL — AB (ref 5–40)

## 2017-11-07 LAB — CBC WITH DIFFERENTIAL/PLATELET
BASOS ABS: 0 10*3/uL (ref 0.0–0.2)
Basos: 1 %
EOS (ABSOLUTE): 0.2 10*3/uL (ref 0.0–0.4)
Eos: 3 %
Hematocrit: 35.5 % — ABNORMAL LOW (ref 37.5–51.0)
Hemoglobin: 11.4 g/dL — ABNORMAL LOW (ref 13.0–17.7)
Immature Grans (Abs): 0 10*3/uL (ref 0.0–0.1)
Immature Granulocytes: 0 %
LYMPHS ABS: 2.2 10*3/uL (ref 0.7–3.1)
Lymphs: 30 %
MCH: 31.9 pg (ref 26.6–33.0)
MCHC: 32.1 g/dL (ref 31.5–35.7)
MCV: 99 fL — ABNORMAL HIGH (ref 79–97)
Monocytes Absolute: 0.5 10*3/uL (ref 0.1–0.9)
Monocytes: 7 %
NEUTROS ABS: 4.2 10*3/uL (ref 1.4–7.0)
Neutrophils: 59 %
PLATELETS: 172 10*3/uL (ref 150–450)
RBC: 3.57 x10E6/uL — ABNORMAL LOW (ref 4.14–5.80)
RDW: 15.1 % (ref 12.3–15.4)
WBC: 7.2 10*3/uL (ref 3.4–10.8)

## 2017-11-07 LAB — CMP14+EGFR
ALK PHOS: 55 IU/L (ref 39–117)
ALT: 9 IU/L (ref 0–44)
AST: 13 IU/L (ref 0–40)
Albumin/Globulin Ratio: 1.2 (ref 1.2–2.2)
Albumin: 3.6 g/dL (ref 3.5–4.7)
BILIRUBIN TOTAL: 0.2 mg/dL (ref 0.0–1.2)
BUN/Creatinine Ratio: 25 — ABNORMAL HIGH (ref 10–24)
BUN: 20 mg/dL (ref 8–27)
CHLORIDE: 101 mmol/L (ref 96–106)
CO2: 24 mmol/L (ref 20–29)
Calcium: 9.1 mg/dL (ref 8.6–10.2)
Creatinine, Ser: 0.8 mg/dL (ref 0.76–1.27)
GFR calc Af Amer: 92 mL/min/{1.73_m2} (ref >59)
GFR calc non Af Amer: 79 mL/min/{1.73_m2} (ref >59)
GLUCOSE: 103 mg/dL — AB (ref 65–99)
Globulin, Total: 2.9 g/dL (ref 1.5–4.5)
Potassium: 3.5 mmol/L (ref 3.5–5.2)
Sodium: 138 mmol/L (ref 134–144)
Total Protein: 6.5 g/dL (ref 6.0–8.5)

## 2017-11-07 MED ORDER — AMOXICILLIN-POT CLAVULANATE 875-125 MG PO TABS: 1.0000 | ORAL_TABLET | Freq: Two times a day (BID) | ORAL | 0 refills | Status: DC

## 2017-11-18 ENCOUNTER — Ambulatory Visit (INDEPENDENT_AMBULATORY_CARE_PROVIDER_SITE_OTHER): Payer: Medicare Other | Admitting: *Deleted

## 2017-11-18 VITALS — BP 144/56 | HR 55 | Ht 68.5 in | Wt 205.0 lb

## 2017-11-18 DIAGNOSIS — Z0000 Encounter for general adult medical examination without abnormal findings: Secondary | ICD-10-CM

## 2017-11-18 DIAGNOSIS — Z23 Encounter for immunization: Secondary | ICD-10-CM

## 2017-11-19 ENCOUNTER — Encounter: Payer: Self-pay | Admitting: *Deleted

## 2017-11-19 NOTE — Progress Notes (Signed)
Subjective:   Brett Blankenship is a 84 y.o. male who presents for an Initial Medicare Annual Wellness Visit.  Brett Blankenship is accompanied by his son Brett Blankenship today.  He retired from working in the [employer] at [employer].  Brett Blankenship is very active in his church, [employer].  He lives alone with his dog, but son lives next door.  He has 2 sons and 1 grandchild.  Brett Blankenship reports that his wife passed away 3 years ago.  He states that his eating habits have changed since then.  He does not cook and usually eats a sandwhich or cereal if he prepares food for himself.  He does get home cooked meals weekly from a family member.  Brett Blankenship reports no hospitalizations, ER visits, or surgeries in the past year.  He feels his health is about the same as it was last year.  He is recovering from pneumonia currently, and has a follow up appointment with Dr. Livia Blankenship on 11/21/17.  Review of Systems   All negative       Objective:    Today's Vitals   11/18/17 1525 11/18/17 1535  BP: (!) 145/58 (!) 144/56  Pulse: (!) 51 (!) 55  Weight: 205 lb (93 kg)   Height: 5' 8.5" (1.74 m)   PainSc: 0-No pain    Body mass index is 30.72 kg/m.  Advanced Directives 11/18/2017 07/16/2016 06/25/2016 06/05/2016  Does Patient Have a Medical Advance Directive? No Yes Yes Yes  Would patient like information on creating a medical advance directive? Yes (MAU/Ambulatory/Procedural Areas - Information given) - - -    Current Medications (verified) Outpatient Encounter Medications as of 11/18/2017  Medication Sig  . amLODipine (NORVASC) 10 MG tablet Take 1 tablet (10 mg total) by mouth daily.  . diclofenac (VOLTAREN) 75 MG EC tablet TAKE (1) TABLET BY MOUTH TWICE DAILY FOR MUSCLE AND JOINT PAIN.  . fish oil-omega-3 fatty acids 1000 MG capsule Take 2 g by mouth daily.    . Multiple Vitamins-Minerals (EYE VITAMINS & MINERALS PO) Take by mouth. [facility]  . prednisoLONE acetate (PRED FORTE) 1 % ophthalmic suspension  Place 1 drop into the left eye 4 (four) times daily.  . Psyllium 30.9 % POWD Drink 1 tablespoon dissolved in water, Daily  . triamterene-hydrochlorothiazide (MAXZIDE-25) 37.5-25 MG tablet Take 1 tablet by mouth daily. For blood pressure and fluid  . [DISCONTINUED] amoxicillin-clavulanate (AUGMENTIN) 875-125 MG tablet Take 1 tablet by mouth 2 (two) times daily. Take all of this medication  . cetirizine (ZYRTEC) 10 MG tablet Take 1 tablet (10 mg total) by mouth daily for 3 days.   No facility-administered encounter medications on file as of 11/18/2017.     Allergies (verified) Patient has no known allergies.   History: Past Medical History:  Diagnosis Date  . HYPERTENSION 07/12/2009   Past Surgical History:  Procedure Laterality Date  . APPENDECTOMY  1960  . SPINE SURGERY  2004   fracture cervical spine   Family History  Problem Relation Age of Onset  . Hypertension Father   . Cancer Brother        colon, lung   Social History   Socioeconomic History  . Marital status: Married    Spouse name: Not on file  . Number of children: Not on file  . Years of education: Not on file  . Highest education level: Not on file  Occupational History  . Not on file  Social Needs  . Financial resource strain:  Not on file  . Food insecurity:    Worry: Not on file    Inability: Not on file  . Transportation needs:    Medical: Not on file    Non-medical: Not on file  Tobacco Use  . Smoking status: Former Smoker    Packs/day: 0.30    Years: 5.00    Pack years: 1.50    Types: Cigarettes    Last attempt to quit: 09/12/1963    Years since quitting: 54.2  . Smokeless tobacco: Never Used  Substance and Sexual Activity  . Alcohol use: Not on file  . Drug use: Not on file  . Sexual activity: Not on file  Lifestyle  . Physical activity:    Days per week: Not on file    Minutes per session: Not on file  . Stress: Not on file  Relationships  . Social connections:    Talks on phone: Not  on file    Gets together: Not on file    Attends religious service: Not on file    Active member of club or organization: Not on file    Attends meetings of clubs or organizations: Not on file    Relationship status: Not on file  Other Topics Concern  . Not on file  Social History Narrative  . Not on file   Tobacco Counseling Counseling given: No   Clinical Intake:     Pain Score: 0-No pain                 Activities of Daily Living In your present state of health, do you have any difficulty performing the following activities: 11/18/2017  Hearing? Y  Vision? N  Difficulty concentrating or making decisions? N  Walking or climbing stairs? N  Dressing or bathing? N  Doing errands, shopping? Y  Comment Sons go to appointments and grocery store with him.  Does not like to drive out of town  Conservation officer, nature and eating ? N  Using the Toilet? N  In the past six months, have you accidently leaked urine? N  Do you have problems with loss of bowel control? N  Managing your Medications? N  Managing your Finances? N  Housekeeping or managing your Housekeeping? N  Some recent data might be hidden     Immunizations and Health Maintenance Immunization History  Administered Date(s) Administered  . Influenza,inj,Quad PF,6+ Mos 01/29/2013  . Pneumococcal Conjugate-13 03/29/2013  . Tdap 11/18/2017   Health Maintenance Due  Topic Date Due  . PNA vac Low Risk Adult (2 of 2 - PPSV23) 03/29/2014    Patient Care Team: Claretta Fraise, MD as PCP - General (Family Medicine)      Assessment:   This is a routine wellness examination for Brett Blankenship.  Hearing/Vision screen No exam data present  Patient states he has ringing in his ears, but is not interested in seeing an audiologist at this time.  He is seen by Dr. Tempie Hoist every 6 months at [facility], and at [facility] for [facility] exams.  Patient states he sees well with his glasses.    Dietary issues and  exercise activities discussed: Current Exercise Habits: The patient does not participate in regular exercise at present, Exercise limited by: orthopedic condition(s)   Mr. Hopes states normally has a smaller breakfast, larger lunch, and small supper.  Usually has cereal or oatmeal and a banana for breakast..    He normally has a vegetable/meat plate at one of the  local restaurants for lunch.  He also has a family member who brings him lunch twice per week.  He usually has a small supper, or drinks Ensure, which he likes.  His son is worried that his father is not eating enough.  Advised that his weight is not changing significantly and father should continue eating 3 meals per day and as needed.  Advised him to try to eat a diet of mostly lean proteins, vegetables, fruits and whole grains.    He also plans to increase his water intake and reduce soda intake.   Goals    . DIET - INCREASE WATER INTAKE     Increase water intake to 48 ounces per day.       Depression Screen PHQ 2/9 Scores 11/19/2017 11/06/2017 07/23/2017 05/09/2017  PHQ - 2 Score 0 0 0 0    Fall Risk Fall Risk  11/19/2017 11/06/2017 07/23/2017 05/09/2017 10/28/2016  Falls in the past year? No No No No No    Is the patient's home free of loose throw rugs in walkways, pet beds, electrical cords, etc?   yes      Grab bars in the bathroom? yes      Handrails on the stairs?   yes      Adequate lighting?   no   Cognitive Function: MMSE - Mini Mental State Exam 11/19/2017  Orientation to time 5  Orientation to Place 4  Registration 3  Attention/ Calculation 4  Recall 1  Language- name 2 objects 2  Language- repeat 1  Language- follow 3 step command 3  Language- read & follow direction 1  Write a sentence 1  Copy design 1  Total score 26        Screening Tests Health Maintenance  Topic Date Due  . PNA vac Low Risk Adult (2 of 2 - PPSV23) 03/29/2014  . INFLUENZA VACCINE  01/09/2018 (Originally 11/27/2017)  . TETANUS/TDAP   11/19/2027    Qualifies for Shingles Vaccine? Declined today  Cancer Screenings: Lung: Low Dose CT Chest recommended if Age 28-80 years, 30 pack-year currently smoking OR have quit w/in 15years. Patient does not qualify.        Plan:     Work on your goal of increasing your water intake to 48 oz per day.   Review the information given on Advance Directives, and if you complete these please bring a copy to our office to be filed in your medical record.   You received your tetanus (tdap) vaccine today.  Please consider getting the prevnar and shingles vaccines in the future.   I have personally reviewed and noted the following in the patient's chart:   . Medical and social history . Use of alcohol, tobacco or illicit drugs  . Current medications and supplements . Functional ability and status . Nutritional status . Physical activity . Advanced directives . List of other physicians . Hospitalizations, surgeries, and ER visits in previous 12 months . Vitals . Screenings to include cognitive, depression, and falls . Referrals and appointments  In addition, I have reviewed and discussed with patient certain preventive protocols, quality metrics, and best practice recommendations. A written personalized care plan for preventive services as well as general preventive health recommendations were provided to patient.     WYATT, AMY M, RN   11/19/2017    I have reviewed and agree with the above AWV documentation.  Claretta Fraise, M.D.

## 2017-11-19 NOTE — Patient Instructions (Signed)
Please work on your goal of increasing your water intake to 48 oz per day.    Please review the information given on Advance Directives, and if you complete these please bring a copy to our office to be filed in your medical record.    You received your tetanus (tdap) vaccine today.  Please consider getting the prevnar and shingles vaccines in the future.  Thank you for coming in for your Annual Wellness Visit today.    Preventive Care 82 Years and Older, Male Preventive care refers to lifestyle choices and visits with your health care provider that can promote health and wellness. What does preventive care include?  A yearly physical exam. This is also called an annual well check.  Dental exams once or twice a year.  Routine eye exams. Ask your health care provider how often you should have your eyes checked.  Personal lifestyle choices, including: ? Daily care of your teeth and gums. ? Regular physical activity. ? Eating a healthy diet. ? Avoiding tobacco and drug use. ? Limiting alcohol use. ? Practicing safe sex. ? Taking low doses of aspirin every day. ? Taking vitamin and mineral supplements as recommended by your health care provider. What happens during an annual well check? The services and screenings done by your health care provider during your annual well check will depend on your age, overall health, lifestyle risk factors, and family history of disease. Counseling Your health care provider may ask you questions about your:  Alcohol use.  Tobacco use.  Drug use.  Emotional well-being.  Home and relationship well-being.  Sexual activity.  Eating habits.  History of falls.  Memory and ability to understand (cognition).  Work and work Statistician.  Screening You may have the following tests or measurements:  Height, weight, and BMI.  Blood pressure.  Lipid and cholesterol levels. These may be checked every 5 years, or more frequently if you are  over 45 years old.  Skin check.  Lung cancer screening. You may have this screening every year starting at age 43 if you have a 30-pack-year history of smoking and currently smoke or have quit within the past 15 years.  Fecal occult blood test (FOBT) of the stool. You may have this test every year starting at age 24.  Flexible sigmoidoscopy or colonoscopy. You may have a sigmoidoscopy every 5 years or a colonoscopy every 10 years starting at age 64.  Prostate cancer screening. Recommendations will vary depending on your family history and other risks.  Hepatitis C blood test.  Hepatitis B blood test.  Sexually transmitted disease (STD) testing.  Diabetes screening. This is done by checking your blood sugar (glucose) after you have not eaten for a while (fasting). You may have this done every 1-3 years.  Abdominal aortic aneurysm (AAA) screening. You may need this if you are a current or former smoker.  Osteoporosis. You may be screened starting at age 67 if you are at high risk.  Talk with your health care provider about your test results, treatment options, and if necessary, the need for more tests. Vaccines Your health care provider may recommend certain vaccines, such as:  Influenza vaccine. This is recommended every year.  Tetanus, diphtheria, and acellular pertussis (Tdap, Td) vaccine. You may need a Td booster every 10 years.  Varicella vaccine. You may need this if you have not been vaccinated.  Zoster vaccine. You may need this after age 59.  Measles, mumps, and rubella (MMR) vaccine. You may  need at least one dose of MMR if you were born in 1957 or later. You may also need a second dose.  Pneumococcal 13-valent conjugate (PCV13) vaccine. One dose is recommended after age 72.  Pneumococcal polysaccharide (PPSV23) vaccine. One dose is recommended after age 28.  Meningococcal vaccine. You may need this if you have certain conditions.  Hepatitis A vaccine. You may  need this if you have certain conditions or if you travel or work in places where you may be exposed to hepatitis A.  Hepatitis B vaccine. You may need this if you have certain conditions or if you travel or work in places where you may be exposed to hepatitis B.  Haemophilus influenzae type b (Hib) vaccine. You may need this if you have certain risk factors.  Talk to your health care provider about which screenings and vaccines you need and how often you need them. This information is not intended to replace advice given to you by your health care provider. Make sure you discuss any questions you have with your health care provider. Document Released: 05/12/2015 Document Revised: 01/03/2016 Document Reviewed: 02/14/2015 Elsevier Interactive Patient Education  [employer].

## 2017-11-21 ENCOUNTER — Ambulatory Visit (INDEPENDENT_AMBULATORY_CARE_PROVIDER_SITE_OTHER): Payer: Medicare Other | Admitting: Family Medicine

## 2017-11-21 ENCOUNTER — Ambulatory Visit (INDEPENDENT_AMBULATORY_CARE_PROVIDER_SITE_OTHER): Payer: Medicare Other

## 2017-11-21 ENCOUNTER — Encounter: Payer: Self-pay | Admitting: Family Medicine

## 2017-11-21 VITALS — BP 142/55 | HR 56 | Temp 98.1°F | Ht 68.5 in | Wt 204.0 lb

## 2017-11-21 DIAGNOSIS — R0989 Other specified symptoms and signs involving the circulatory and respiratory systems: Secondary | ICD-10-CM | POA: Diagnosis not present

## 2017-11-21 DIAGNOSIS — J929 Pleural plaque without asbestos: Secondary | ICD-10-CM | POA: Diagnosis not present

## 2017-11-21 DIAGNOSIS — J189 Pneumonia, unspecified organism: Secondary | ICD-10-CM | POA: Diagnosis not present

## 2017-11-21 NOTE — Progress Notes (Signed)
Subjective:  Patient ID: HRITHIK BOSCHEE, male    DOB: Oct 31, 1926  Age: 84 y.o. MRN: [id number]  CC: Follow up abnormal chest xray   HPI KENDRICK HAAPALA presents for recheck of his pneumonia.  He was seen 2 weeks ago and found to have significant rales.  Chest x-ray was suspicious.  Therefore he is back in today finishing his antibiotics.  He is feeling well.  He has had some cough that has been mild otherwise no no symptoms currently.  He has not been short of breath.  No fever chills or sweats.  Depression screen [facility] 2/9 11/21/2017 11/19/2017 11/06/2017  Decreased Interest 0 0 0  Down, Depressed, Hopeless 0 0 0  PHQ - 2 Score 0 0 0    History Marcy has a past medical history of HYPERTENSION (07/12/2009).   He has a past surgical history that includes Appendectomy (3016) and Spine surgery (2004).   His family history includes Cancer in his brother; Hypertension in his father.He reports that he quit smoking about 54 years ago. His smoking use included cigarettes. He has a 1.50 pack-year smoking history. He has never used smokeless tobacco. His alcohol and drug histories are not on file.    ROS Review of Systems  Constitutional: Negative for fever.  Respiratory: Negative for shortness of breath.   Cardiovascular: Negative for chest pain.  Musculoskeletal: Negative for arthralgias.  Skin: Negative for rash.    Objective:  BP (!) 142/55   Pulse (!) 56   Temp 98.1 F (36.7 C) (Oral)   Ht 5' 8.5" (1.74 m)   Wt 204 lb (92.5 kg)   BMI 30.57 kg/m   BP Readings from Last 3 Encounters:  11/21/17 (!) 142/55  11/18/17 (!) 144/56  11/06/17 (!) 138/58    Wt Readings from Last 3 Encounters:  11/21/17 204 lb (92.5 kg)  11/18/17 205 lb (93 kg)  11/06/17 206 lb 4 oz (93.6 kg)     Physical Exam  Constitutional: He is oriented to person, place, and time. He appears well-developed and well-nourished.  HENT:  Head: Normocephalic and atraumatic.  Right Ear: External ear  normal.  Left Ear: External ear normal.  Mouth/Throat: No oropharyngeal exudate or posterior oropharyngeal erythema.  Eyes: Pupils are equal, round, and reactive to light.  Neck: Normal range of motion. Neck supple.  Cardiovascular: Normal rate and regular rhythm.  No murmur heard. Pulmonary/Chest: Breath sounds normal. No respiratory distress.  Neurological: He is alert and oriented to person, place, and time.  Vitals reviewed.   Chest x-ray is clear no acute findings noted.  Assessment & Plan:   Darryll was seen today for follow up abnormal chest xray.  Diagnoses and all orders for this visit:  [place] Chest 2 View; Future  We will go ahead with observation only.  He should be done with his antibiotics no further antibiotic should be necessary.  I am having Jerral Mccauley. Amason maintain his fish oil-omega-3 fatty acids, Multiple Vitamins-Minerals (EYE VITAMINS & MINERALS PO), Psyllium, prednisoLONE acetate, triamterene-hydrochlorothiazide, cetirizine, diclofenac, and amLODipine.  Allergies as of 11/21/2017   No Known Allergies     Medication List        Accurate as of 11/21/17 11:59 PM. Always use your most recent med list.          amLODipine 10 MG tablet Commonly known as:  NORVASC Take 1 tablet (10 mg total) by mouth daily.   cetirizine 10 MG tablet  Commonly known as:  ZYRTEC Take 1 tablet (10 mg total) by mouth daily for 3 days.   diclofenac 75 MG EC tablet Commonly known as:  VOLTAREN TAKE (1) TABLET BY MOUTH TWICE DAILY FOR MUSCLE AND JOINT PAIN.   EYE VITAMINS & MINERALS PO Take by mouth. [facility]   fish oil-omega-3 fatty acids 1000 MG capsule Take 2 g by mouth daily.   prednisoLONE acetate 1 % ophthalmic suspension Commonly known as:  PRED FORTE Place 1 drop into the left eye 4 (four) times daily.   Psyllium 30.9 % Powd Drink 1 tablespoon dissolved in water, Daily   triamterene-hydrochlorothiazide 37.5-25 MG tablet Commonly known as:   MAXZIDE-25 Take 1 tablet by mouth daily. For blood pressure and fluid        Follow-up: Return if symptoms worsen or fail to improve.  Claretta Fraise, M.D.

## 2017-12-02 ENCOUNTER — Encounter (INDEPENDENT_AMBULATORY_CARE_PROVIDER_SITE_OTHER): Payer: Medicare Other | Admitting: Ophthalmology

## 2017-12-02 ENCOUNTER — Encounter: Payer: Self-pay | Admitting: Family Medicine

## 2017-12-02 DIAGNOSIS — H35033 Hypertensive retinopathy, bilateral: Secondary | ICD-10-CM | POA: Diagnosis not present

## 2017-12-02 DIAGNOSIS — H33302 Unspecified retinal break, left eye: Secondary | ICD-10-CM

## 2017-12-02 DIAGNOSIS — H59032 Cystoid macular edema following cataract surgery, left eye: Secondary | ICD-10-CM

## 2017-12-02 DIAGNOSIS — H353132 Nonexudative age-related macular degeneration, bilateral, intermediate dry stage: Secondary | ICD-10-CM

## 2017-12-02 DIAGNOSIS — I10 Essential (primary) hypertension: Secondary | ICD-10-CM | POA: Diagnosis not present

## 2017-12-02 DIAGNOSIS — H43813 Vitreous degeneration, bilateral: Secondary | ICD-10-CM

## 2017-12-16 DIAGNOSIS — Z029 Encounter for administrative examinations, unspecified: Secondary | ICD-10-CM

## 2018-04-13 ENCOUNTER — Other Ambulatory Visit: Payer: Self-pay | Admitting: Family Medicine

## 2018-04-13 DIAGNOSIS — I10 Essential (primary) hypertension: Secondary | ICD-10-CM

## 2018-04-13 NOTE — Telephone Encounter (Signed)
OV 05/11/18

## 2018-05-11 ENCOUNTER — Encounter: Payer: Self-pay | Admitting: Family Medicine

## 2018-05-11 ENCOUNTER — Ambulatory Visit (INDEPENDENT_AMBULATORY_CARE_PROVIDER_SITE_OTHER): Payer: Medicare Other | Admitting: Family Medicine

## 2018-05-11 ENCOUNTER — Encounter (INDEPENDENT_AMBULATORY_CARE_PROVIDER_SITE_OTHER): Payer: Self-pay

## 2018-05-11 VITALS — BP 132/64 | HR 68 | Temp 98.5°F | Ht 68.5 in | Wt 201.5 lb

## 2018-05-11 DIAGNOSIS — E782 Mixed hyperlipidemia: Secondary | ICD-10-CM | POA: Diagnosis not present

## 2018-05-11 DIAGNOSIS — E538 Deficiency of other specified B group vitamins: Secondary | ICD-10-CM | POA: Diagnosis not present

## 2018-05-11 DIAGNOSIS — M25562 Pain in left knee: Secondary | ICD-10-CM

## 2018-05-11 DIAGNOSIS — I10 Essential (primary) hypertension: Secondary | ICD-10-CM

## 2018-05-11 DIAGNOSIS — G8929 Other chronic pain: Secondary | ICD-10-CM

## 2018-05-11 MED ORDER — DICLOFENAC SODIUM 75 MG PO TBEC: DELAYED_RELEASE_TABLET | ORAL | 1 refills | Status: DC

## 2018-05-11 MED ORDER — AMLODIPINE BESYLATE 10 MG PO TABS: 10.0000 mg | ORAL_TABLET | Freq: Every day | ORAL | 1 refills | Status: DC

## 2018-05-11 MED ORDER — TRIAMTERENE-HCTZ 37.5-25 MG PO TABS: ORAL_TABLET | ORAL | 1 refills | Status: DC

## 2018-05-12 LAB — CMP14+EGFR
A/G RATIO: 1.2 (ref 1.2–2.2)
ALT: 12 IU/L (ref 0–44)
AST: 11 IU/L (ref 0–40)
Albumin: 3.7 g/dL (ref 3.5–4.7)
Alkaline Phosphatase: 51 IU/L (ref 39–117)
BILIRUBIN TOTAL: 0.3 mg/dL (ref 0.0–1.2)
BUN / CREAT RATIO: 21 (ref 10–24)
BUN: 20 mg/dL (ref 8–27)
CHLORIDE: 103 mmol/L (ref 96–106)
CO2: 23 mmol/L (ref 20–29)
Calcium: 9.2 mg/dL (ref 8.6–10.2)
Creatinine, Ser: 0.96 mg/dL (ref 0.76–1.27)
GFR calc Af Amer: 81 mL/min/{1.73_m2} (ref >59)
GFR calc non Af Amer: 70 mL/min/{1.73_m2} (ref >59)
GLUCOSE: 113 mg/dL — AB (ref 65–99)
Globulin, Total: 3.1 g/dL (ref 1.5–4.5)
Potassium: 3.7 mmol/L (ref 3.5–5.2)
Sodium: 140 mmol/L (ref 134–144)
TOTAL PROTEIN: 6.8 g/dL (ref 6.0–8.5)

## 2018-05-12 LAB — CBC WITH DIFFERENTIAL/PLATELET
BASOS: 1 %
Basophils Absolute: 0.1 10*3/uL (ref 0.0–0.2)
EOS (ABSOLUTE): 0.1 10*3/uL (ref 0.0–0.4)
EOS: 1 %
HEMATOCRIT: 33 % — AB (ref 37.5–51.0)
Hemoglobin: 11.8 g/dL — ABNORMAL LOW (ref 13.0–17.7)
IMMATURE GRANS (ABS): 0 10*3/uL (ref 0.0–0.1)
Immature Granulocytes: 0 %
LYMPHS ABS: 2.6 10*3/uL (ref 0.7–3.1)
LYMPHS: 33 %
MCH: 34.3 pg — AB (ref 26.6–33.0)
MCHC: 35.8 g/dL — ABNORMAL HIGH (ref 31.5–35.7)
MCV: 96 fL (ref 79–97)
MONOCYTES: 7 %
Monocytes Absolute: 0.5 10*3/uL (ref 0.1–0.9)
NEUTROS ABS: 4.5 10*3/uL (ref 1.4–7.0)
Neutrophils: 58 %
Platelets: 154 10*3/uL (ref 150–450)
RBC: 3.44 x10E6/uL — ABNORMAL LOW (ref 4.14–5.80)
RDW: 14.5 % (ref 11.6–15.4)
WBC: 7.8 10*3/uL (ref 3.4–10.8)

## 2018-05-12 LAB — LIPID PANEL
Chol/HDL Ratio: 9.1 ratio — ABNORMAL HIGH (ref 0.0–5.0)
Cholesterol, Total: 181 mg/dL (ref 100–199)
HDL: 20 mg/dL — ABNORMAL LOW (ref >39)
Triglycerides: 625 mg/dL (ref 0–149)

## 2018-05-12 LAB — VITAMIN B12: VITAMIN B 12: 581 pg/mL (ref 232–1245)

## 2018-05-13 ENCOUNTER — Other Ambulatory Visit: Payer: Self-pay | Admitting: *Deleted

## 2018-05-13 DIAGNOSIS — E782 Mixed hyperlipidemia: Secondary | ICD-10-CM

## 2018-05-14 ENCOUNTER — Other Ambulatory Visit: Payer: Medicare Other

## 2018-05-14 DIAGNOSIS — E782 Mixed hyperlipidemia: Secondary | ICD-10-CM

## 2018-05-15 ENCOUNTER — Other Ambulatory Visit: Payer: Self-pay | Admitting: Family Medicine

## 2018-05-15 LAB — LIPID PANEL
Chol/HDL Ratio: 9.4 ratio — ABNORMAL HIGH (ref 0.0–5.0)
Cholesterol, Total: 198 mg/dL (ref 100–199)
HDL: 21 mg/dL — ABNORMAL LOW (ref >39)
Triglycerides: 642 mg/dL (ref 0–149)

## 2018-05-15 MED ORDER — FENOFIBRATE 160 MG PO TABS: 160.0000 mg | ORAL_TABLET | Freq: Every day | ORAL | 3 refills | Status: DC

## 2018-05-19 ENCOUNTER — Encounter: Payer: Self-pay | Admitting: Family Medicine

## 2018-05-19 NOTE — Progress Notes (Signed)
Subjective:  Patient ID: Brett Blankenship, male    DOB: 1926/06/01  Age: 85 y.o. MRN: [id number]  CC: Medical Management of Chronic Issues   HPI Brett Blankenship presents for follow-up of elevated cholesterol. Doing well without complaints on current medication. Denies side effects of statin including myalgia and arthralgia and nausea. Also in today for liver function testing. Currently no chest pain, shortness of breath or other cardiovascular related symptoms noted.   Follow-up of hypertension. Patient has no history of headache chest pain or shortness of breath or recent cough. Patient also denies symptoms of TIA such as numbness weakness lateralizing. Patient checks  blood pressure at home and has not had any elevated readings recently. Patient denies side effects from his medication. States taking it regularly.  Patient also has a history of vitamin B12 deficiency causing macrocytosis and tingling peripherally.  Due today for recheck of that level   Patient continues to have pain in the left knee.  It interferes with activities.  Is weak as well.  Pain is 7-8/10 with activity.  It is relieved somewhat with rest.  History Brett Blankenship has a past medical history of HYPERTENSION (07/12/2009).   He has a past surgical history that includes Appendectomy (4098) and Spine surgery (2004).   His family history includes Cancer in his brother; Hypertension in his father.He reports that he quit smoking about 54 years ago. His smoking use included cigarettes. He has a 1.50 pack-year smoking history. He has never used smokeless tobacco. No history on file for alcohol and drug.  Current Outpatient Medications on File Prior to Visit  Medication Sig Dispense Refill  . cetirizine (ZYRTEC) 10 MG tablet Take 1 tablet (10 mg total) by mouth daily for 3 days. 5 tablet 0  . fish oil-omega-3 fatty acids 1000 MG capsule Take 2 g by mouth daily.      . Multiple Vitamins-Minerals (EYE VITAMINS & MINERALS PO)  Take by mouth. [facility]    . prednisoLONE acetate (PRED FORTE) 1 % ophthalmic suspension Place 1 drop into the left eye 4 (four) times daily. 5 mL 1  . Psyllium 30.9 % POWD Drink 1 tablespoon dissolved in water, Daily 1 Bottle 11   No current facility-administered medications on file prior to visit.     ROS Review of Systems  Constitutional: Negative.   HENT: Negative.   Eyes: Negative for visual disturbance.  Respiratory: Negative for cough and shortness of breath.   Cardiovascular: Negative for chest pain and leg swelling.  Gastrointestinal: Negative for abdominal pain, diarrhea, nausea and vomiting.  Genitourinary: Negative for difficulty urinating.  Musculoskeletal: Positive for arthralgias. Negative for myalgias.  Skin: Negative for rash.  Neurological: Negative for headaches.  Psychiatric/Behavioral: Negative for sleep disturbance.    Objective:  BP 132/64   Pulse 68   Temp 98.5 F (36.9 C) (Oral)   Ht 5' 8.5" (1.74 m)   Wt 201 lb 8 oz (91.4 kg)   BMI 30.19 kg/m   BP Readings from Last 3 Encounters:  05/11/18 132/64  11/21/17 (!) 142/55  11/18/17 (!) 144/56    Wt Readings from Last 3 Encounters:  05/11/18 201 lb 8 oz (91.4 kg)  11/21/17 204 lb (92.5 kg)  11/18/17 205 lb (93 kg)     Physical Exam Constitutional:      General: He is not in acute distress.    Appearance: He is well-developed.  HENT:     Head: Normocephalic and atraumatic.     Right Ear:  External ear normal.     Left Ear: External ear normal.     Nose: Nose normal.  Eyes:     Conjunctiva/sclera: Conjunctivae normal.     Pupils: Pupils are equal, round, and reactive to light.  Neck:     Musculoskeletal: Normal range of motion and neck supple.  Cardiovascular:     Rate and Rhythm: Normal rate and regular rhythm.     Heart sounds: Normal heart sounds. No murmur.  Pulmonary:     Effort: Pulmonary effort is normal. No respiratory distress.     Breath sounds: Normal breath sounds. No  wheezing or rales.  Abdominal:     Palpations: Abdomen is soft.     Tenderness: There is no abdominal tenderness.  Musculoskeletal: Normal range of motion.        General: Tenderness and deformity (Hypertrophy of the left knee joint) present.  Skin:    General: Skin is warm and dry.  Neurological:     Mental Status: He is alert and oriented to person, place, and time.     Deep Tendon Reflexes: Reflexes are normal and symmetric.  Psychiatric:        Behavior: Behavior normal.        Thought Content: Thought content normal.        Judgment: Judgment normal.     No results found for: HGBA1C  Lab Results  Component Value Date   WBC 7.8 05/11/2018   HGB 11.8 (L) 05/11/2018   HCT 33.0 (L) 05/11/2018   PLT 154 05/11/2018   GLUCOSE 113 (H) 05/11/2018   CHOL 198 05/14/2018   TRIG 642 (HH) 05/14/2018   HDL 21 (L) 05/14/2018   LDLCALC Comment 05/14/2018   ALT 12 05/11/2018   AST 11 05/11/2018   NA 140 05/11/2018   K 3.7 05/11/2018   CL 103 05/11/2018   CREATININE 0.96 05/11/2018   BUN 20 05/11/2018   CO2 23 05/11/2018   TSH 2.410 05/28/2016    No results found.  Assessment & Plan:   Brett Blankenship was seen today for medical management of chronic issues.  Diagnoses and all orders for this visit:  Hyperlipidemia, mixed -     CBC with Differential/Platelet -     CMP14+EGFR -     Lipid panel  Vitamin B12 deficiency -     Vitamin B12  Chronic pain of left knee -     diclofenac (VOLTAREN) 75 MG EC tablet; TAKE (1) TABLET BY MOUTH TWICE DAILY FOR MUSCLE AND JOINT PAIN.  Essential hypertension -     amLODipine (NORVASC) 10 MG tablet; Take 1 tablet (10 mg total) by mouth daily. -     triamterene-hydrochlorothiazide (MAXZIDE-25) 37.5-25 MG tablet; TAKE ONE TABLET BY MOUTH ONCE DAILY FOR BLOOD PRESSURE AND FLUID.   I am having Brett Blankenship. Brett Blankenship maintain his fish oil-omega-3 fatty acids, Multiple Vitamins-Minerals (EYE VITAMINS & MINERALS PO), Psyllium, prednisoLONE acetate,  cetirizine, diclofenac, amLODipine, and triamterene-hydrochlorothiazide.  Meds ordered this encounter  Medications  . diclofenac (VOLTAREN) 75 MG EC tablet    Sig: TAKE (1) TABLET BY MOUTH TWICE DAILY FOR MUSCLE AND JOINT PAIN.    Dispense:  180 tablet    Refill:  1  . amLODipine (NORVASC) 10 MG tablet    Sig: Take 1 tablet (10 mg total) by mouth daily.    Dispense:  90 tablet    Refill:  1  . triamterene-hydrochlorothiazide (MAXZIDE-25) 37.5-25 MG tablet    Sig: TAKE ONE TABLET BY MOUTH ONCE  DAILY FOR BLOOD PRESSURE AND FLUID.    Dispense:  90 tablet    Refill:  1   Discussed referral to orthopedics for knee evaluation.  He says he is just too old for that.  He does not want to consider surgery.  Follow-up: Return in about 6 months (around 11/09/2018).  Claretta Fraise, M.D.

## 2018-06-04 ENCOUNTER — Encounter (INDEPENDENT_AMBULATORY_CARE_PROVIDER_SITE_OTHER): Payer: Medicare Other | Admitting: Ophthalmology

## 2018-06-04 DIAGNOSIS — H33301 Unspecified retinal break, right eye: Secondary | ICD-10-CM

## 2018-06-04 DIAGNOSIS — H59032 Cystoid macular edema following cataract surgery, left eye: Secondary | ICD-10-CM | POA: Diagnosis not present

## 2018-06-04 DIAGNOSIS — H35033 Hypertensive retinopathy, bilateral: Secondary | ICD-10-CM

## 2018-06-04 DIAGNOSIS — H353122 Nonexudative age-related macular degeneration, left eye, intermediate dry stage: Secondary | ICD-10-CM

## 2018-06-04 DIAGNOSIS — H353111 Nonexudative age-related macular degeneration, right eye, early dry stage: Secondary | ICD-10-CM

## 2018-06-04 DIAGNOSIS — I10 Essential (primary) hypertension: Secondary | ICD-10-CM | POA: Diagnosis not present

## 2018-06-04 DIAGNOSIS — H43813 Vitreous degeneration, bilateral: Secondary | ICD-10-CM

## 2018-06-26 DIAGNOSIS — H353132 Nonexudative age-related macular degeneration, bilateral, intermediate dry stage: Secondary | ICD-10-CM | POA: Diagnosis not present

## 2018-06-26 DIAGNOSIS — Z961 Presence of intraocular lens: Secondary | ICD-10-CM | POA: Diagnosis not present

## 2018-06-26 DIAGNOSIS — H04123 Dry eye syndrome of bilateral lacrimal glands: Secondary | ICD-10-CM | POA: Diagnosis not present

## 2018-09-21 ENCOUNTER — Other Ambulatory Visit: Payer: Self-pay | Admitting: Family Medicine

## 2018-09-21 DIAGNOSIS — G8929 Other chronic pain: Secondary | ICD-10-CM

## 2018-09-21 DIAGNOSIS — M25562 Pain in left knee: Secondary | ICD-10-CM

## 2018-09-21 DIAGNOSIS — I10 Essential (primary) hypertension: Secondary | ICD-10-CM

## 2018-10-12 ENCOUNTER — Other Ambulatory Visit: Payer: Self-pay | Admitting: Family Medicine

## 2018-11-09 ENCOUNTER — Other Ambulatory Visit: Payer: Self-pay

## 2018-11-09 ENCOUNTER — Ambulatory Visit (INDEPENDENT_AMBULATORY_CARE_PROVIDER_SITE_OTHER): Payer: Medicare Other | Admitting: Family Medicine

## 2018-11-09 ENCOUNTER — Encounter: Payer: Self-pay | Admitting: Family Medicine

## 2018-11-09 ENCOUNTER — Telehealth: Payer: Self-pay | Admitting: Family Medicine

## 2018-11-09 DIAGNOSIS — G8929 Other chronic pain: Secondary | ICD-10-CM

## 2018-11-09 DIAGNOSIS — M25562 Pain in left knee: Secondary | ICD-10-CM | POA: Diagnosis not present

## 2018-11-09 DIAGNOSIS — I10 Essential (primary) hypertension: Secondary | ICD-10-CM

## 2018-11-09 MED ORDER — AMLODIPINE BESYLATE 10 MG PO TABS: 10.0000 mg | ORAL_TABLET | Freq: Every day | ORAL | 1 refills | Status: DC

## 2018-11-09 MED ORDER — TRIAMTERENE-HCTZ 37.5-25 MG PO TABS: ORAL_TABLET | ORAL | 1 refills | Status: DC

## 2018-11-09 MED ORDER — DICLOFENAC SODIUM 75 MG PO TBEC: DELAYED_RELEASE_TABLET | ORAL | 1 refills | Status: DC

## 2018-11-09 NOTE — Progress Notes (Addendum)
Subjective:    Patient ID: Brett Blankenship, male    DOB: July 02, 1926, 85 y.o.   MRN: [id number]   HPI: Brett Blankenship is a 85 y.o. male presenting for  presents for  follow-up of hypertension. Patient has no history of headache chest pain or shortness of breath or recent cough. Patient also denies symptoms of TIA such as focal numbness or weakness. Patient denies side effects from medication. States taking it regularly.  Knee and hip pain slows him  Down. Diclofenac gives adequate relief. Son, Brett Blankenship, tells me that his Dad gets aaround so well it's hard to keep up with him.   Depression screen [facility] 2/9 05/11/2018 11/21/2017 11/19/2017 11/06/2017 07/23/2017  Decreased Interest 0 0 0 0 0  Down, Depressed, Hopeless 0 0 0 0 0  PHQ - 2 Score 0 0 0 0 0     Relevant past medical, surgical, family and social history reviewed and updated as indicated.  Interim medical history since our last visit reviewed. Allergies and medications reviewed and updated.  ROS:  Review of Systems  Constitutional: Negative.   HENT: Negative.   Eyes: Negative for visual disturbance.  Respiratory: Negative for cough and shortness of breath.   Cardiovascular: Negative for chest pain and leg swelling.  Gastrointestinal: Negative for abdominal pain, diarrhea, nausea and vomiting.  Genitourinary: Negative for difficulty urinating.  Musculoskeletal: Positive for arthralgias (hip and knee). Negative for myalgias.  Skin: Negative for rash.  Neurological: Negative for headaches.  Psychiatric/Behavioral: Negative for sleep disturbance.     Social History   Tobacco Use  Smoking Status Former Smoker  . Packs/day: 0.30  . Years: 5.00  . Pack years: 1.50  . Types: Cigarettes  . Quit date: 09/12/1963  . Years since quitting: 55.1  Smokeless Tobacco Never Used       Objective:     Wt Readings from Last 3 Encounters:  05/11/18 201 lb 8 oz (91.4 kg)  11/21/17 204 lb (92.5 kg)  11/18/17 205 lb (93 kg)      Exam deferred. Pt. Harboring due to COVID 19. Phone visit performed.   Assessment & Plan:   1. Essential hypertension   2. Chronic pain of left knee     Meds ordered this encounter  Medications  . amLODipine (NORVASC) 10 MG tablet    Sig: Take 1 tablet (10 mg total) by mouth daily.    Dispense:  90 tablet    Refill:  1  . triamterene-hydrochlorothiazide (MAXZIDE-25) 37.5-25 MG tablet    Sig: TAKE ONE TABLET BY MOUTH ONCE DAILY FOR BLOOD PRESSURE AND FLUID.    Dispense:  90 tablet    Refill:  1  . diclofenac (VOLTAREN) 75 MG EC tablet    Sig: TAKE (1) TABLET BY MOUTH TWICE DAILY FOR MUSCLE AND JOINT PAIN.    Dispense:  180 tablet    Refill:  1    No orders of the defined types were placed in this encounter.     Diagnoses and all orders for this visit:  Essential hypertension -     amLODipine (NORVASC) 10 MG tablet; Take 1 tablet (10 mg total) by mouth daily. -     triamterene-hydrochlorothiazide (MAXZIDE-25) 37.5-25 MG tablet; TAKE ONE TABLET BY MOUTH ONCE DAILY FOR BLOOD PRESSURE AND FLUID.  Chronic pain of left knee -     diclofenac (VOLTAREN) 75 MG EC tablet; TAKE (1) TABLET BY MOUTH TWICE DAILY FOR MUSCLE AND JOINT PAIN.    Virtual  Visit via telephone Note  I discussed the limitations, risks, security and privacy concerns of performing an evaluation and management service by telephone and the availability of in person appointments. The patient was identified with two identifiers. Pt.expressed understanding and agreed to proceed. Pt. Is at home. Dr. Livia Snellen is in his office.  Follow Up Instructions:   I discussed the assessment and treatment plan with the patient. The patient was provided an opportunity to ask questions and all were answered. The patient agreed with the plan and demonstrated an understanding of the instructions.   The patient was advised to call back or seek an in-person evaluation if the symptoms worsen or if the condition fails to improve as  anticipated.   Total minutes including chart review and phone contact time: 18   Follow up plan: Return in about 6 months (around 05/12/2019).  Claretta Fraise, MD [place]

## 2018-12-03 ENCOUNTER — Other Ambulatory Visit: Payer: Self-pay

## 2018-12-03 ENCOUNTER — Encounter (INDEPENDENT_AMBULATORY_CARE_PROVIDER_SITE_OTHER): Payer: Medicare Other | Admitting: Ophthalmology

## 2018-12-03 DIAGNOSIS — H353111 Nonexudative age-related macular degeneration, right eye, early dry stage: Secondary | ICD-10-CM | POA: Diagnosis not present

## 2018-12-03 DIAGNOSIS — H35033 Hypertensive retinopathy, bilateral: Secondary | ICD-10-CM

## 2018-12-03 DIAGNOSIS — H353221 Exudative age-related macular degeneration, left eye, with active choroidal neovascularization: Secondary | ICD-10-CM | POA: Diagnosis not present

## 2018-12-03 DIAGNOSIS — H43813 Vitreous degeneration, bilateral: Secondary | ICD-10-CM

## 2018-12-03 DIAGNOSIS — I10 Essential (primary) hypertension: Secondary | ICD-10-CM | POA: Diagnosis not present

## 2018-12-03 DIAGNOSIS — H33302 Unspecified retinal break, left eye: Secondary | ICD-10-CM

## 2018-12-04 ENCOUNTER — Other Ambulatory Visit: Payer: Self-pay

## 2018-12-04 ENCOUNTER — Encounter (HOSPITAL_BASED_OUTPATIENT_CLINIC_OR_DEPARTMENT_OTHER): Payer: Self-pay | Admitting: *Deleted

## 2018-12-04 ENCOUNTER — Emergency Department (HOSPITAL_BASED_OUTPATIENT_CLINIC_OR_DEPARTMENT_OTHER): Payer: Medicare Other

## 2018-12-04 ENCOUNTER — Emergency Department (HOSPITAL_BASED_OUTPATIENT_CLINIC_OR_DEPARTMENT_OTHER)
Admission: EM | Admit: 2018-12-04 | Discharge: 2018-12-04 | Disposition: A | Discharge status: Home / Self Care | Payer: Medicare Other | Attending: Emergency Medicine | Admitting: Emergency Medicine

## 2018-12-04 DIAGNOSIS — M1990 Unspecified osteoarthritis, unspecified site: Secondary | ICD-10-CM

## 2018-12-04 DIAGNOSIS — I10 Essential (primary) hypertension: Secondary | ICD-10-CM | POA: Diagnosis not present

## 2018-12-04 DIAGNOSIS — I7090 Unspecified atherosclerosis: Secondary | ICD-10-CM | POA: Diagnosis not present

## 2018-12-04 DIAGNOSIS — Z87891 Personal history of nicotine dependence: Secondary | ICD-10-CM | POA: Diagnosis not present

## 2018-12-04 DIAGNOSIS — M1712 Unilateral primary osteoarthritis, left knee: Secondary | ICD-10-CM | POA: Diagnosis not present

## 2018-12-04 DIAGNOSIS — M25562 Pain in left knee: Secondary | ICD-10-CM | POA: Diagnosis present

## 2018-12-04 DIAGNOSIS — M179 Osteoarthritis of knee, unspecified: Secondary | ICD-10-CM | POA: Diagnosis not present

## 2018-12-04 DIAGNOSIS — G8929 Other chronic pain: Secondary | ICD-10-CM | POA: Insufficient documentation

## 2018-12-04 DIAGNOSIS — Z79899 Other long term (current) drug therapy: Secondary | ICD-10-CM | POA: Diagnosis not present

## 2018-12-04 MED ORDER — LIDOCAINE 5 % EX PTCH
1.0000 | MEDICATED_PATCH | CUTANEOUS | Status: DC
  Filled 2018-12-04: qty 1

## 2018-12-04 NOTE — ED Triage Notes (Signed)
Left knee pain since yesterday am. No injury. This am he had pain standing.

## 2018-12-04 NOTE — Discharge Instructions (Signed)
Please read and follow all provided instructions.  Your diagnoses today include:  1. Chronic pain of left knee   2. Arthritis     Tests performed today include:  An x-ray of the affected area - shows arthritis, no broken bones  Vital signs. See below for your results today.   Medications prescribed:   None  Take any prescribed medications only as directed.  Home care instructions:   Follow any educational materials contained in this packet  Follow R.I.C.E. Protocol:  R - rest your injury   I  - use ice on injury without applying directly to skin  C - compress injury with bandage or splint  E - elevate the injury as much as possible  Follow-up instructions: Please follow-up with your primary care provider if you continue to have significant pain in 1 week. In this case you may have a more severe injury that requires further care.   Return instructions:   Please return if your toes or feet are numb or tingling, appear gray or blue, or you have severe pain (also elevate the leg and loosen splint or wrap if you were given one)  Please return to the Emergency Department if you experience worsening symptoms.   Please return if you have any other emergent concerns.  Additional Information:  Your vital signs today were: BP (!) 164/65 (BP Location: Left Arm)    Pulse (!) 48    Temp 98.1 F (36.7 C)    Resp 18    Ht 5\' 8"  (1.727 m)    Wt 90.7 kg    SpO2 100%    BMI 30.41 kg/m  If your blood pressure (BP) was elevated above 135/85 this visit, please have this repeated by your doctor within one month. --------------

## 2018-12-04 NOTE — ED Provider Notes (Signed)
[facility] EMERGENCY DEPARTMENT Provider Note   CSN: [id number] Arrival date & time: 12/04/18  1312     History   Chief Complaint Chief Complaint  Patient presents with  . Knee Pain    HPI Brett Blankenship is a 85 y.o. male.     Patient presents to the emergency department with complaint of left knee pain.  Patient states that he has had pain in the left knee for a couple of years and had therapy for approximately 2 years ago.  Patient states that most the time he does pretty well with it.  Starting about 2 days ago when he got up he had a sharp pain in the outside of the knee that has been more intermittent over the past 2 days.  He denies any new injuries or falls, but spends a lot of the day working in his garden.  His son states that he is too active for his age.  He denies any pain in the thigh or the calf.  No swelling noted.  No history of gout.  No abdominal pain or left leg numbness or tingling.  No color change in the leg.  Onset of symptoms acute.  Course is intermittent.  Activity and bearing weight makes the pain worse.     Past Medical History:  Diagnosis Date  . HYPERTENSION 07/12/2009    Patient Active Problem List   Diagnosis Date Noted  . Systolic murmur 66/59/9357  . Bradycardia 12/01/2016  . Chronic pain of left knee 10/10/2016  . Essential hypertension 07/12/2009    Past Surgical History:  Procedure Laterality Date  . APPENDECTOMY  1960  . SPINE SURGERY  2004   fracture cervical spine        Home Medications    Prior to Admission medications   Medication Sig Start Date End Date Taking? Authorizing Provider  amLODipine (NORVASC) 10 MG tablet Take 1 tablet (10 mg total) by mouth daily. 11/09/18   Claretta Fraise, MD  diclofenac (VOLTAREN) 75 MG EC tablet TAKE (1) TABLET BY MOUTH TWICE DAILY FOR MUSCLE AND JOINT PAIN. 11/09/18   Claretta Fraise, MD  fenofibrate 160 MG tablet Take 1 tablet (160 mg total) by mouth daily. For cholesterol and  triglyceride 05/15/18   Claretta Fraise, MD  fish oil-omega-3 fatty acids 1000 MG capsule Take 2 g by mouth daily.      [provider]  Multiple Vitamins-Minerals (EYE VITAMINS & MINERALS PO) Take by mouth. [facility]    [provider]  prednisoLONE acetate (PRED FORTE) 1 % ophthalmic suspension PLACE 1 DROP INTO THE LEFT EYE 4 TIMES DAILY. 10/12/18   Claretta Fraise, MD  Psyllium 30.9 % POWD Drink 1 tablespoon dissolved in water, Daily 05/28/16   Claretta Fraise, MD  triamterene-hydrochlorothiazide (MAXZIDE-25) 37.5-25 MG tablet TAKE ONE TABLET BY MOUTH ONCE DAILY FOR BLOOD PRESSURE AND FLUID. 11/09/18   Claretta Fraise, MD    Family History Family History  Problem Relation Age of Onset  . Hypertension Father   . Cancer Brother        colon, lung    Social History Social History   Tobacco Use  . Smoking status: Former Smoker    Packs/day: 0.30    Years: 5.00    Pack years: 1.50    Types: Cigarettes    Quit date: 09/12/1963    Years since quitting: 55.2  . Smokeless tobacco: Never Used  Substance Use Topics  . Alcohol use: Not on file  .  Drug use: Not on file     Allergies   Patient has no known allergies.   Review of Systems Review of Systems  Constitutional: Negative for activity change.  Musculoskeletal: Positive for arthralgias. Negative for back pain, gait problem, joint swelling and neck pain.  Skin: Negative for wound.  Neurological: Negative for weakness and numbness.     Physical Exam Updated Vital Signs BP (!) 164/65 (BP Location: Left Arm)   Pulse (!) 48   Temp 98.1 F (36.7 C)   Resp 18   Ht 5\' 8"  (1.727 m)   Wt 90.7 kg   SpO2 100%   BMI 30.41 kg/m   Physical Exam Vitals signs and nursing note reviewed.  Constitutional:      Appearance: He is well-developed.  HENT:     Head: Normocephalic and atraumatic.  Eyes:     Conjunctiva/sclera: Conjunctivae normal.  Neck:     Musculoskeletal: Normal range of motion and neck supple.   Cardiovascular:     Pulses: Normal pulses. No decreased pulses.  Musculoskeletal:        General: Tenderness present.     Left hip: Normal.     Left knee: He exhibits normal range of motion, no swelling and no effusion. Tenderness found. Lateral joint line tenderness noted.     Left ankle: Normal.       Legs:  Skin:    General: Skin is warm and dry.  Neurological:     Mental Status: He is alert.     Sensory: No sensory deficit.     Comments: Motor, sensation, and vascular distal to the injury is fully intact.       ED Treatments / Results  Labs (all labs ordered are listed, but only abnormal results are displayed) Labs Reviewed - No data to display  EKG None  Radiology Dg Knee Complete 4 Views Left  Result Date: 12/04/2018 CLINICAL DATA:  Left knee swelling and pain intermittently for several years. No reported acute injury or prior relevant surgery. EXAM: LEFT KNEE - COMPLETE 4+ VIEW COMPARISON:  Radiographs 05/28/2016. FINDINGS: The bones are adequately mineralized. There are moderate tricompartmental degenerative changes, greatest within the medial compartment and similar to the previous study. There is no significant joint effusion. Femoropopliteal atherosclerosis is noted. The soft tissues are prominent medially, similar to previous study. IMPRESSION: Similar left knee tricompartmental osteoarthritis to previous study from 2018. No acute osseous findings. Electronically Signed   By: Richardean Sale M.D.   On: 12/04/2018 14:55    Procedures Procedures (including critical care time)  Medications Ordered in ED Medications  lidocaine (LIDODERM) 5 % 1 patch (1 patch Transdermal Not Given 12/04/18 1535)     Initial Impression / Assessment and Plan / ED Course  I have reviewed the triage vital signs and the nursing notes.  Pertinent labs & imaging results that were available during my care of the patient were reviewed by me and considered in my medical decision making (see  chart for details).        Patient seen and examined.  Will obtain x-ray.  Left lower extremity appears neurovascularly intact.  Vital signs reviewed and are as follows: BP (!) 164/65 (BP Location: Left Arm)   Pulse (!) 48   Temp 98.1 F (36.7 C)   Resp 18   Ht 5\' 8"  (1.727 m)   Wt 90.7 kg   SpO2 100%   BMI 30.41 kg/m   Patient discussed with and seen by Dr. Billy Fischer.  Patient's x-ray demonstrates severe tricompartmental osteoarthritis, no fractures.  This is consistent with the patient's history.  Patient does not remember what medications he is on but I encouraged him to continue taking his prescribed Voltaren.  He may also use Tylenol for pain.  We discussed use of ice and heat and need for rest to help inflammation from arthritis calm down.  Patient and son are in agreement with plan.  Encourage PCP follow-up as needed if symptoms not improving in the next week.  Final Clinical Impressions(s) / ED Diagnoses   Final diagnoses:  Chronic pain of left knee  Arthritis   Patient with left knee pain, no signs of effusion.  Pain is located over the joint itself.  No thigh or calf tenderness or clinical signs of DVT.  Imaging shows arthritis.  No signs of gout or septic arthritis on exam.  Patient continues to be very active for his age and he may have just overdone it a bit.  Discussed signs and symptoms to return.  ED Discharge Orders    None       Carlisle Cater, Hershal Coria 12/04/18 1544    Gareth Morgan, MD 12/05/18 1154

## 2018-12-07 ENCOUNTER — Telehealth: Payer: Self-pay | Admitting: Family Medicine

## 2018-12-07 NOTE — Telephone Encounter (Signed)
Apt scheduled 8/11 with Dr. Livia Snellen.

## 2018-12-08 ENCOUNTER — Ambulatory Visit (INDEPENDENT_AMBULATORY_CARE_PROVIDER_SITE_OTHER): Payer: Medicare Other | Admitting: Family Medicine

## 2018-12-08 ENCOUNTER — Encounter: Payer: Self-pay | Admitting: Family Medicine

## 2018-12-08 ENCOUNTER — Other Ambulatory Visit: Payer: Self-pay

## 2018-12-08 VITALS — BP 135/46 | HR 45 | Temp 97.7°F | Ht 68.0 in | Wt 193.0 lb

## 2018-12-08 DIAGNOSIS — M25562 Pain in left knee: Secondary | ICD-10-CM | POA: Diagnosis not present

## 2018-12-08 DIAGNOSIS — G8929 Other chronic pain: Secondary | ICD-10-CM | POA: Diagnosis not present

## 2018-12-08 MED ORDER — NABUMETONE 500 MG PO TABS: 1000.0000 mg | ORAL_TABLET | Freq: Two times a day (BID) | ORAL | 1 refills | Status: DC

## 2018-12-08 NOTE — Progress Notes (Signed)
Subjective:  Patient ID: Brett Blankenship, male    DOB: 09/30/1926  Age: 85 y.o. MRN: [id number]  CC: Hospitalization Follow-up (ER - with [facility] - left knee pain )   HPI [name] presents for acute on chronic left knee pain.  6 days ago as he got out of bed in the morning he turned to his right and felt a sudden intense sharp pain in the left lateral knee.  He almost went down but he caught himself on the side of the bed and the wall beside him.  It felt better but 10 minutes later he started getting more pain and soreness.  He went to the emergency room 2 days later and has been taking Tylenol since that time for it along with his diclofenac.  In the emergency room he had an x-ray that showed tricompartmental arthritis of the left knee.  Essentially unchanged from study 2 years prior.  Patient now has some improvement.  He has been taking Tylenol.  However pain is still significant and interfering with his mobility.  Depression screen [facility] 2/9 12/08/2018 05/11/2018 11/21/2017  Decreased Interest 0 0 0  Down, Depressed, Hopeless 0 0 0  PHQ - 2 Score 0 0 0    History Keyshon has a past medical history of HYPERTENSION (07/12/2009).   He has a past surgical history that includes Appendectomy (0254) and Spine surgery (2004).   His family history includes Cancer in his brother; Hypertension in his father.He reports that he quit smoking about 55 years ago. His smoking use included cigarettes. He has a 1.50 pack-year smoking history. He has never used smokeless tobacco. No history on file for alcohol and drug.    ROS Review of Systems  Constitutional: Positive for activity change. Negative for fatigue and fever.  Musculoskeletal: Positive for arthralgias and gait problem.    Objective:  BP (!) 135/46   Pulse (!) 45   Temp 97.7 F (36.5 C)   Ht 5\' 8"  (1.727 m)   Wt 193 lb (87.5 kg)   BMI 29.35 kg/m   BP Readings from Last 3 Encounters:  12/08/18 (!) 135/46  12/04/18 133/69   05/11/18 132/64    Wt Readings from Last 3 Encounters:  12/08/18 193 lb (87.5 kg)  12/04/18 200 lb (90.7 kg)  05/11/18 201 lb 8 oz (91.4 kg)     Physical Exam Vitals signs reviewed.  Constitutional:      Appearance: He is well-developed.  HENT:     Head: Normocephalic and atraumatic.     Right Ear: External ear normal.     Left Ear: External ear normal.     Mouth/Throat:     Pharynx: No oropharyngeal exudate or posterior oropharyngeal erythema.  Eyes:     Pupils: Pupils are equal, round, and reactive to light.  Neck:     Musculoskeletal: Normal range of motion and neck supple.  Cardiovascular:     Rate and Rhythm: Normal rate and regular rhythm.     Heart sounds: No murmur.  Pulmonary:     Effort: No respiratory distress.     Breath sounds: Normal breath sounds.  Musculoskeletal:        General: Tenderness (left knee lateral joint line.) present.  Skin:    General: Skin is warm and dry.  Neurological:     Mental Status: He is alert and oriented to person, place, and time.       Assessment & Plan:   Abir was seen today  for hospitalization follow-up.  Diagnoses and all orders for this visit:  Chronic pain of left knee -     Ambulatory referral to Physical Therapy  Other orders -     nabumetone (RELAFEN) 500 MG tablet; Take 2 tablets (1,000 mg total) by mouth 2 (two) times daily. For muscle and joint pain       I have discontinued Jesus Genera. Kisamore's diclofenac. I am also having him start on nabumetone. Additionally, I am having him maintain his fish oil-omega-3 fatty acids, Multiple Vitamins-Minerals (EYE VITAMINS & MINERALS PO), Psyllium, fenofibrate, prednisoLONE acetate, amLODipine, and triamterene-hydrochlorothiazide.  Allergies as of 12/08/2018   No Known Allergies     Medication List       Accurate as of December 08, 2018  9:50 AM. If you have any questions, ask your nurse or doctor.        STOP taking these medications   diclofenac 75 MG  EC tablet Commonly known as: VOLTAREN Stopped by: Claretta Fraise, MD     TAKE these medications   amLODipine 10 MG tablet Commonly known as: NORVASC Take 1 tablet (10 mg total) by mouth daily.   EYE VITAMINS & MINERALS PO Take by mouth. [facility]   fenofibrate 160 MG tablet Take 1 tablet (160 mg total) by mouth daily. For cholesterol and triglyceride   fish oil-omega-3 fatty acids 1000 MG capsule Take 2 g by mouth daily.   nabumetone 500 MG tablet Commonly known as: RELAFEN Take 2 tablets (1,000 mg total) by mouth 2 (two) times daily. For muscle and joint pain Started by: Claretta Fraise, MD   prednisoLONE acetate 1 % ophthalmic suspension Commonly known as: PRED FORTE PLACE 1 DROP INTO THE LEFT EYE 4 TIMES DAILY.   Psyllium 30.9 % Powd Drink 1 tablespoon dissolved in water, Daily   triamterene-hydrochlorothiazide 37.5-25 MG tablet Commonly known as: MAXZIDE-25 TAKE ONE TABLET BY MOUTH ONCE DAILY FOR BLOOD PRESSURE AND FLUID.        Follow-up: Return in 1 month (on 01/08/2019), or if symptoms worsen or fail to improve.  Claretta Fraise, M.D.

## 2018-12-14 ENCOUNTER — Encounter: Payer: Self-pay | Admitting: Physical Therapy

## 2018-12-14 ENCOUNTER — Other Ambulatory Visit: Payer: Self-pay

## 2018-12-14 ENCOUNTER — Ambulatory Visit: Payer: Medicare Other | Attending: Family Medicine | Admitting: Physical Therapy

## 2018-12-14 DIAGNOSIS — R262 Difficulty in walking, not elsewhere classified: Secondary | ICD-10-CM

## 2018-12-14 DIAGNOSIS — M25562 Pain in left knee: Secondary | ICD-10-CM | POA: Insufficient documentation

## 2018-12-14 NOTE — Therapy (Signed)
[facility] [place], [place], [address] Phone: [phone]   Fax:  [phone]  Physical Therapy Evaluation  Patient Details  Name: Brett Blankenship MRN: [id number] Date of Birth: 1926/06/14 Referring Provider (PT): Claretta Fraise, MD   Encounter Date: 12/14/2018  PT End of Session - 12/14/18 1320    Visit Number  1    Number of Visits  4    Date for PT Re-Evaluation  01/25/19    PT Start Time  1115    PT Stop Time  1158    PT Time Calculation (min)  43 min    Activity Tolerance  Patient tolerated treatment well    Behavior During Therapy  [facility] for tasks assessed/performed       Past Medical History:  Diagnosis Date  . HYPERTENSION 07/12/2009    Past Surgical History:  Procedure Laterality Date  . APPENDECTOMY  1960  . SPINE SURGERY  2004   fracture cervical spine    There were no vitals filed for this visit.   Subjective Assessment - 12/14/18 1306    Subjective  COVID-19 screening performed upon arrival.Patient arrives to physical therapy with acute left knee pain that began after twisting on it after getting out of bed about 2 weeks ago. Patient reports going to the ER to which his x-ray showed tricompartmental arthritis. Patient reports pain has been improving since use of prescription medication. Patient is unable to determine what makes pain worse or better. Patient's son states he has decreased his levels of activity and has decreased his walking speed. Patient's goals are to improve mobility and to decrease intermittent left knee pain.    Pertinent History  HTN    Limitations  House hold activities;Walking;Standing    Diagnostic tests  x-ray: tricompartmental arthritis    Patient Stated Goals  get going without pain    Currently in Pain?  No/denies         [facility] PT Assessment - 12/14/18 0001      Assessment   Medical Diagnosis  Chronic left knee pain    Referring Provider (PT)  Claretta Fraise, MD    Onset Date/Surgical Date  --   "2 weeks ago"   Next MD Visit  January 2021    Prior Therapy  no      Precautions   Precautions  None      Restrictions   Weight Bearing Restrictions  No      Balance Screen   Has the patient fallen in the past 6 months  No    Has the patient had a decrease in activity level because of a fear of falling?   No    Is the patient reluctant to leave their home because of a fear of falling?   No      Home Film/video editor residence      Prior Function   Level of Independence  Independent with basic ADLs      ROM / Strength   AROM / PROM / Strength  AROM;Strength      AROM   AROM Assessment Site  Knee    Right/Left Knee  Left    Left Knee Extension  6    Left Knee Flexion  105      Strength   Strength Assessment Site  Knee    Right/Left Knee  Left    Left Knee Flexion  4/5    Left Knee Extension  4+/5  Palpation   Palpation comment  tenderness to left medial hamstring and to left ITB      Transfers   Comments  Independent but with increased time to perform activity      Ambulation/Gait   Gait Pattern  Step-through pattern;Decreased stride length;Decreased stance time - left;Left flexed knee in stance    Gait Comments  decreased gait speed                Objective measurements completed on examination: See above findings.              PT Education - 12/14/18 1319    Education Details  marching, hip abduction. hip adduction, LAQ, hamstring set, quad set    Person(s) Educated  Patient;Child(ren)    Methods  Explanation;Demonstration;Handout    Comprehension  Verbalized understanding;Returned demonstration          PT Long Term Goals - 12/14/18 1321      PT LONG TERM GOAL #1   Title  Independent with a HEP.    Time  2    Period  Weeks    Status  New      PT LONG TERM GOAL #2   Title  Patient will demonstrate 115+ degrees of left knee flexion AROM to improve ability to perform  functional tasks.    Time  2    Period  Weeks    Status  New      PT LONG TERM GOAL #3   Title  Patient will demonstrate 4+/5 or greater left knee MMT in all planes to improve stability during functional tasks.    Time  4    Period  Weeks    Status  Achieved             Plan - 12/14/18 1454    Clinical Impression Statement  Patient is a 85 year old male who presents to physical therapy with his son with left knee pain and decreased left knee AROM and left knee MMT. Patient noted with tenderness to palpation to left hamstring and to ITB. Patient (-) left knee ligamentous testing. Patient ambulates with decreased stride length, decreased gait speed, and decreased left stance time. Patient, patient's son, and PT reviewed HEP and discussed the benefits of performing to maximize PT. Patient reported understanding. Patient would  benefit from skilled physical therapy to address deficits and address patient's goals.    Personal Factors and Comorbidities  Age;Comorbidity 1    Comorbidities  HTN    Stability/Clinical Decision Making  Stable/Uncomplicated    Clinical Decision Making  Low    Rehab Potential  Good    PT Frequency  1x / week    PT Duration  4 weeks    PT Treatment/Interventions  ADLs/Self Care Home Management;Electrical Stimulation;Cryotherapy;Moist Heat;Balance training;Therapeutic exercise;Therapeutic activities;Functional mobility training;Stair training;Gait training;Neuromuscular re-education;Manual techniques;Patient/family education;Vasopneumatic Device;Taping;Passive range of motion    PT Next Visit Plan  nustep, strengthening for left knee, modalities PRN for pain relief.    Consulted and Agree with Plan of Care  Patient       Patient will benefit from skilled therapeutic intervention in order to improve the following deficits and impairments:  Decreased balance, Decreased strength, Decreased range of motion, Difficulty walking, Pain, Decreased safety awareness  Visit  Diagnosis: 1. Acute pain of left knee   2. Difficulty in walking, not elsewhere classified        Problem List Patient Active Problem List   Diagnosis Date Noted  .  Systolic murmur 97/28/2060  . Bradycardia 12/01/2016  . Chronic pain of left knee 10/10/2016  . Essential hypertension 07/12/2009   Gabriela Eves, PT, DPT 12/14/2018, 2:59 PM  [facility] [address] [place], [place], [address] Phone: [phone]   Fax:  [phone]  Name: Brett Blankenship MRN: [id number] Date of Birth: [birth year]

## 2019-02-22 ENCOUNTER — Ambulatory Visit (INDEPENDENT_AMBULATORY_CARE_PROVIDER_SITE_OTHER): Payer: Medicare Other | Admitting: *Deleted

## 2019-02-22 DIAGNOSIS — Z0000 Encounter for general adult medical examination without abnormal findings: Secondary | ICD-10-CM | POA: Diagnosis not present

## 2019-02-22 NOTE — Progress Notes (Signed)
MEDICARE ANNUAL WELLNESS VISIT  02/22/2019  Telephone Visit Disclaimer This Medicare AWV was conducted by telephone due to national recommendations for restrictions regarding the COVID-19 Pandemic (e.g. social distancing).  I verified, using two identifiers, that I am speaking with Brett Blankenship or their authorized healthcare agent. I discussed the limitations, risks, security, and privacy concerns of performing an evaluation and management service by telephone and the potential availability of an in-person appointment in the future. The patient expressed understanding and agreed to proceed.   Subjective:  Brett Blankenship is a 85 y.o. male patient of Stacks, Cletus Gash, MD who had a Medicare Annual Wellness Visit today via telephone. [name] is Retired and lives alone. he has 2 children. he reports that he is socially active and does interact with friends/family regularly. he is minimally physically active and enjoys fishing and cutting wood.  Patient Care Team: Claretta Fraise, MD as PCP - General (Family Medicine)  Advanced Directives 02/22/2019 12/14/2018 12/04/2018 11/18/2017 07/16/2016 06/25/2016 06/05/2016  Does Patient Have a Medical Advance Directive? Yes Yes Yes No Yes Yes Yes  Type of [employer] [place];Living will - - - -  Does patient want to make changes to medical advance directive? No - Patient declined - - - - - -  Copy of [place] in Chart? No - copy requested - - - - - -  Would patient like information on creating a medical advance directive? - - - Yes (MAU/Ambulatory/Procedural Areas - Information given) - - [facility] Utilization Over the Past 12 Months: # of hospitalizations or ER visits: 1 # of surgeries: 0  Review of Systems    Patient reports that his overall health is unchanged compared to last year.  History obtained from chart review  Patient Reported Readings (BP, Pulse, CBG,  Weight, etc) none  Pain Assessment Pain : No/denies pain     Current Medications & Allergies (verified) Allergies as of 02/22/2019   No Known Allergies     Medication List       Accurate as of February 22, 2019 11:12 AM. If you have any questions, ask your nurse or doctor.        STOP taking these medications   Psyllium 30.9 % Powd     TAKE these medications   amLODipine 10 MG tablet Commonly known as: NORVASC Take 1 tablet (10 mg total) by mouth daily.   EYE VITAMINS & MINERALS PO Take by mouth. [facility]   fenofibrate 160 MG tablet Take 1 tablet (160 mg total) by mouth daily. For cholesterol and triglyceride   fish oil-omega-3 fatty acids 1000 MG capsule Take 2 g by mouth daily.   nabumetone 500 MG tablet Commonly known as: RELAFEN Take 2 tablets (1,000 mg total) by mouth 2 (two) times daily. For muscle and joint pain   prednisoLONE acetate 1 % ophthalmic suspension Commonly known as: PRED FORTE PLACE 1 DROP INTO THE LEFT EYE 4 TIMES DAILY.   triamterene-hydrochlorothiazide 37.5-25 MG tablet Commonly known as: MAXZIDE-25 TAKE ONE TABLET BY MOUTH ONCE DAILY FOR BLOOD PRESSURE AND FLUID.       History (reviewed): Past Medical History:  Diagnosis Date  . HYPERTENSION 07/12/2009   Past Surgical History:  Procedure Laterality Date  . APPENDECTOMY  1960   Family History  Problem Relation Age of Onset  . Hypertension Father   . Cancer Brother  colon, lung   Social History   Socioeconomic History  . Marital status: Widowed    Spouse name: Not on file  . Number of children: 2  . Years of education: 7  . Highest education level: 7th grade  Occupational History  . Not on file  Social Needs  . Financial resource strain: Not hard at all  . Food insecurity    Worry: Never true    Inability: Never true  . Transportation needs    Medical: No    Non-medical: No  Tobacco Use  . Smoking status: Former Smoker    Packs/day: 0.30    Years:  5.00    Pack years: 1.50    Types: Cigarettes    Quit date: 09/12/1963    Years since quitting: 55.4  . Smokeless tobacco: Never Used  Substance and Sexual Activity  . Alcohol use: Not Currently  . Drug use: Not Currently  . Sexual activity: Not Currently  Lifestyle  . Physical activity    Days per week: 0 days    Minutes per session: 0 min  . Stress: Not at all  Relationships  . Social connections    Talks on phone: More than three times a week    Gets together: More than three times a week    Attends religious service: More than 4 times per year    Active member of club or organization: Yes    Attends meetings of clubs or organizations: More than 4 times per year    Relationship status: Widowed  Other Topics Concern  . Not on file  Social History Narrative  . Not on file    Activities of Daily Living In your present state of health, do you have any difficulty performing the following activities: 02/22/2019  Hearing? Y  Comment ears ringing-doesn't have hearing aids but says he has noticed he doesn't hear as well  Vision? Y  Comment left eye blurry  Difficulty concentrating or making decisions? N  Walking or climbing stairs? N  Dressing or bathing? N  Doing errands, shopping? N  Preparing Food and eating ? N  Using the Toilet? N  In the past six months, have you accidently leaked urine? N  Do you have problems with loss of bowel control? N  Managing your Medications? N  Managing your Finances? N  Housekeeping or managing your Housekeeping? N  Some recent data might be hidden    Patient Education/ Literacy How often do you need to have someone help you when you read instructions, pamphlets, or other written materials from your doctor or pharmacy?: 2 - Rarely What is the last grade level you completed in school?: 7th grade  Exercise Current Exercise Habits: The patient does not participate in regular exercise at present, Exercise limited by: orthopedic condition(s)   Diet Patient reports consuming 3 meals a day and 0 snack(s) a day Patient reports that his primary diet is: Regular Patient reports that she does have regular access to food.   Depression Screen PHQ 2/9 Scores 02/22/2019 12/08/2018 05/11/2018 11/21/2017 11/19/2017 11/06/2017 07/23/2017  PHQ - 2 Score 0 0 0 0 0 0 0     Fall Risk Fall Risk  02/22/2019 12/08/2018 05/11/2018 11/21/2017 11/19/2017  Falls in the past year? 0 0 0 No No  Number falls in past yr: 0 - - - -  Injury with Fall? 0 - - - -  Follow up Falls prevention discussed - - - -  Comment Get rid of  all throw rugs in the house, adequate lighting in the walkways and grab bars in the bathroom - - - -     Objective:  Brett Blankenship seemed alert and oriented and he participated appropriately during our telephone visit.  Blood Pressure Weight BMI  BP Readings from Last 3 Encounters:  12/08/18 (!) 135/46  12/04/18 133/69  05/11/18 132/64   Wt Readings from Last 3 Encounters:  12/08/18 193 lb (87.5 kg)  12/04/18 200 lb (90.7 kg)  05/11/18 201 lb 8 oz (91.4 kg)   BMI Readings from Last 1 Encounters:  12/08/18 29.35 kg/m    *Unable to obtain current vital signs, weight, and BMI due to telephone visit type  Hearing/Vision  . Brett Blankenship did not seem to have difficulty with hearing/understanding during the telephone conversation . Reports that he has not had a formal eye exam by an eye care professional within the past year . Reports that he has not had a formal hearing evaluation within the past year *Unable to fully assess hearing and vision during telephone visit type  Cognitive Function: 6CIT Screen 02/22/2019  What Year? 0 points  What month? 0 points  What time? 0 points  Count back from 20 0 points  Months in reverse 0 points  Repeat phrase 2 points  Total Score 2   (Normal:0-7, Significant for Dysfunction: >8)  Normal Cognitive Function Screening: Yes   Immunization & Health Maintenance Record Immunization  History  Administered Date(s) Administered  . Influenza,inj,Quad PF,6+ Mos 01/29/2013  . Pneumococcal Conjugate-13 03/29/2013  . Tdap 11/18/2017    Health Maintenance  Topic Date Due  . PNA vac Low Risk Adult (2 of 2 - PPSV23) 03/29/2014  . INFLUENZA VACCINE  11/28/2018  . TETANUS/TDAP  11/19/2027       Assessment  This is a routine wellness examination for Brett Blankenship.  Health Maintenance: Due or Overdue Health Maintenance Due  Topic Date Due  . PNA vac Low Risk Adult (2 of 2 - PPSV23) 03/29/2014  . INFLUENZA VACCINE  11/28/2018    Brett Blankenship does not need a referral for Community Assistance: Care Management:   no Social Work:    no Prescription Assistance:  no Nutrition/Diabetes Education:  no   Plan:  Personalized Goals Goals Addressed            This Visit's Progress   . Have 3 meals a day        Personalized Health Maintenance & Screening Recommendations  Pneumococcal vaccine  Influenza vaccine Shingles vaccine  Lung Cancer Screening Recommended: no (Low Dose CT Chest recommended if Age 69-80 years, 30 pack-year currently smoking OR have quit w/in past 15 years) Hepatitis C Screening recommended: no HIV Screening recommended: no  Advanced Directives: Written information was not prepared per patient's request.  Referrals & Orders No orders of the defined types were placed in this encounter.   Follow-up Plan . Follow-up with Claretta Fraise, MD as planned . Consider Flu, Pneumovax 23 and Shingles vaccines at your next visit with your PCP   I have personally reviewed and noted the following in the patient's chart:   . Medical and social history . Use of alcohol, tobacco or illicit drugs  . Current medications and supplements . Functional ability and status . Nutritional status . Physical activity . Advanced directives . List of other physicians . Hospitalizations, surgeries, and ER visits in previous 12 months . Vitals .  Screenings to include cognitive, depression, and falls .  Referrals and appointments  In addition, I have reviewed and discussed with Brett Blankenship certain preventive protocols, quality metrics, and best practice recommendations. A written personalized care plan for preventive services as well as general preventive health recommendations is available and can be mailed to the patient at his request.      Milas Hock, LPN  [phone]

## 2019-02-22 NOTE — Patient Instructions (Signed)
Preventive Care 75 Years and Older, Male Preventive care refers to lifestyle choices and visits with your health care provider that can promote health and wellness. This includes:  A yearly physical exam. This is also called an annual well check.  Regular dental and eye exams.  Immunizations.  Screening for certain conditions.  Healthy lifestyle choices, such as diet and exercise. What can I expect for my preventive care visit? Physical exam Your health care provider will check:  Height and weight. These may be used to calculate body mass index (BMI), which is a measurement that tells if you are at a healthy weight.  Heart rate and blood pressure.  Your skin for abnormal spots. Counseling Your health care provider may ask you questions about:  Alcohol, tobacco, and drug use.  Emotional well-being.  Home and relationship well-being.  Sexual activity.  Eating habits.  History of falls.  Memory and ability to understand (cognition).  Work and work Statistician. What immunizations do I need?  Influenza (flu) vaccine  This is recommended every year. Tetanus, diphtheria, and pertussis (Tdap) vaccine  You may need a Td booster every 10 years. Varicella (chickenpox) vaccine  You may need this vaccine if you have not already been vaccinated. Zoster (shingles) vaccine  You may need this after age 50. Pneumococcal conjugate (PCV13) vaccine  One dose is recommended after age 24. Pneumococcal polysaccharide (PPSV23) vaccine  One dose is recommended after age 33. Measles, mumps, and rubella (MMR) vaccine  You may need at least one dose of MMR if you were born in 1957 or later. You may also need a second dose. Meningococcal conjugate (MenACWY) vaccine  You may need this if you have certain conditions. Hepatitis A vaccine  You may need this if you have certain conditions or if you travel or work in places where you may be exposed to hepatitis A. Hepatitis B vaccine   You may need this if you have certain conditions or if you travel or work in places where you may be exposed to hepatitis B. Haemophilus influenzae type b (Hib) vaccine  You may need this if you have certain conditions. You may receive vaccines as individual doses or as more than one vaccine together in one shot (combination vaccines). Talk with your health care provider about the risks and benefits of combination vaccines. What tests do I need? Blood tests  Lipid and cholesterol levels. These may be checked every 5 years, or more frequently depending on your overall health.  Hepatitis C test.  Hepatitis B test. Screening  Lung cancer screening. You may have this screening every year starting at age 74 if you have a 30-pack-year history of smoking and currently smoke or have quit within the past 15 years.  Colorectal cancer screening. All adults should have this screening starting at age 57 and continuing until age 54. Your health care provider may recommend screening at age 47 if you are at increased risk. You will have tests every 1-10 years, depending on your results and the type of screening test.  Prostate cancer screening. Recommendations will vary depending on your family history and other risks.  Diabetes screening. This is done by checking your blood sugar (glucose) after you have not eaten for a while (fasting). You may have this done every 1-3 years.  Abdominal aortic aneurysm (AAA) screening. You may need this if you are a current or former smoker.  Sexually transmitted disease (STD) testing. Follow these instructions at home: Eating and drinking  Eat  a diet that includes fresh fruits and vegetables, whole grains, lean protein, and low-fat dairy products. Limit your intake of foods with high amounts of sugar, saturated fats, and salt.  Take vitamin and mineral supplements as recommended by your health care provider.  Do not drink alcohol if your health care provider  tells you not to drink.  If you drink alcohol: ? Limit how much you have to 0-2 drinks a day. ? Be aware of how much alcohol is in your drink. In the [place] one drink equals one 12 oz bottle of beer (355 mL), one 5 oz glass of wine (148 mL), or one 1 oz glass of hard liquor (44 mL). Lifestyle  Take daily care of your teeth and gums.  Stay active. Exercise for at least 30 minutes on 5 or more days each week.  Do not use any products that contain nicotine or tobacco, such as cigarettes, e-cigarettes, and chewing tobacco. If you need help quitting, ask your health care provider.  If you are sexually active, practice safe sex. Use a condom or other form of protection to prevent STIs (sexually transmitted infections).  Talk with your health care provider about taking a low-dose aspirin or statin. What's next?  Visit your health care provider once a year for a well check visit.  Ask your health care provider how often you should have your eyes and teeth checked.  Stay up to date on all vaccines. This information is not intended to replace advice given to you by your health care provider. Make sure you discuss any questions you have with your health care provider. Document Released: 05/12/2015 Document Revised: 04/09/2018 Document Reviewed: 04/09/2018 Elsevier Patient Education  2020 [employer] Inc.  

## 2019-03-16 ENCOUNTER — Other Ambulatory Visit: Payer: Self-pay | Admitting: Family Medicine

## 2019-05-10 ENCOUNTER — Other Ambulatory Visit: Payer: Self-pay | Admitting: Family Medicine

## 2019-05-11 ENCOUNTER — Other Ambulatory Visit: Payer: Self-pay

## 2019-05-12 ENCOUNTER — Encounter: Payer: Self-pay | Admitting: Family Medicine

## 2019-05-12 ENCOUNTER — Ambulatory Visit (INDEPENDENT_AMBULATORY_CARE_PROVIDER_SITE_OTHER): Payer: Medicare Other | Admitting: Family Medicine

## 2019-05-12 VITALS — BP 135/78 | HR 80 | Temp 97.5°F | Ht 68.0 in | Wt 192.0 lb

## 2019-05-12 DIAGNOSIS — E559 Vitamin D deficiency, unspecified: Secondary | ICD-10-CM | POA: Diagnosis not present

## 2019-05-12 DIAGNOSIS — D485 Neoplasm of uncertain behavior of skin: Secondary | ICD-10-CM | POA: Diagnosis not present

## 2019-05-12 DIAGNOSIS — M25562 Pain in left knee: Secondary | ICD-10-CM

## 2019-05-12 DIAGNOSIS — E782 Mixed hyperlipidemia: Secondary | ICD-10-CM

## 2019-05-12 DIAGNOSIS — Z125 Encounter for screening for malignant neoplasm of prostate: Secondary | ICD-10-CM

## 2019-05-12 DIAGNOSIS — I10 Essential (primary) hypertension: Secondary | ICD-10-CM

## 2019-05-12 DIAGNOSIS — G8929 Other chronic pain: Secondary | ICD-10-CM

## 2019-05-12 MED ORDER — FENOFIBRATE 160 MG PO TABS: 160.0000 mg | ORAL_TABLET | Freq: Every day | ORAL | 0 refills | Status: DC

## 2019-05-12 MED ORDER — TRIAMTERENE-HCTZ 37.5-25 MG PO TABS: ORAL_TABLET | ORAL | 1 refills | Status: DC

## 2019-05-12 MED ORDER — AMLODIPINE BESYLATE 10 MG PO TABS: 10.0000 mg | ORAL_TABLET | Freq: Every day | ORAL | 1 refills | Status: DC

## 2019-05-12 MED ORDER — NABUMETONE 500 MG PO TABS: ORAL_TABLET | ORAL | 0 refills | Status: DC

## 2019-05-12 NOTE — Progress Notes (Signed)
Subjective:  Patient ID: Brett Blankenship, male    DOB: [birth year]  Age: 86 y.o. MRN: [id number]  CC: Follow-up (6 month)   HPI Brett Blankenship presents for  follow-up of hypertension. Patient has no history of headache chest pain or shortness of breath or recent cough. Patient also denies symptoms of TIA such as focal numbness or weakness. Patient denies side effects from medication. States taking it regularly.  A black piece of a mole fell off acouple weeks ago. Located at base of neck left anteriorAlso scaly place on left forearm.    History Brett Blankenship has a past medical history of HYPERTENSION (07/12/2009).   He has a past surgical history that includes Appendectomy (1960).   His family history includes Cancer in his brother; Hypertension in his father.He reports that he quit smoking about 55 years ago. His smoking use included cigarettes. He has a 1.50 pack-year smoking history. He has never used smokeless tobacco. He reports previous alcohol use. He reports previous drug use.  Current Outpatient Medications on File Prior to Visit  Medication Sig Dispense Refill  . fish oil-omega-3 fatty acids 1000 MG capsule Take 2 g by mouth daily.      . Multiple Vitamins-Minerals (EYE VITAMINS & MINERALS PO) Take by mouth. [facility]    . prednisoLONE acetate (PRED FORTE) 1 % ophthalmic suspension PLACE 1 DROP INTO THE LEFT EYE 4 TIMES DAILY. 10 mL 0   No current facility-administered medications on file prior to visit.    ROS Review of Systems  Objective:  BP 135/78   Pulse 80   Temp (!) 97.5 F (36.4 C) (Temporal)   Ht 5\' 8"  (1.727 m)   Wt 192 lb (87.1 kg)   BMI 29.19 kg/m   BP Readings from Last 3 Encounters:  05/12/19 135/78  12/08/18 (!) 135/46  12/04/18 133/69    Wt Readings from Last 3 Encounters:  05/12/19 192 lb (87.1 kg)  12/08/18 193 lb (87.5 kg)  12/04/18 200 lb (90.7 kg)     Physical Exam    Assessment & Plan:   Brett Blankenship was seen today for  follow-up.  Diagnoses and all orders for this visit:  Essential hypertension -     CBC -     CMP -     Lipid -     PSA Total (Reflex To Free) -     VITAMIN D -     amLODipine (NORVASC) 10 MG tablet; Take 1 tablet (10 mg total) by mouth daily. -     triamterene-hydrochlorothiazide (MAXZIDE-25) 37.5-25 MG tablet; TAKE ONE TABLET BY MOUTH ONCE DAILY FOR BLOOD PRESSURE AND FLUID.  Hyperlipidemia, mixed -     CMP -     Lipid -     fenofibrate 160 MG tablet; Take 1 tablet (160 mg total) by mouth daily.  Neoplasm of uncertain behavior of skin -     Dermatology  Screening for prostate cancer -     PSA Total (Reflex To Free)  Vitamin D deficiency -     VITAMIN D  Chronic pain of left knee -     nabumetone (RELAFEN) 500 MG tablet; TAKE 2 TABLETS BY MOUTH 2 TIMES PER DAY FOR MUSCLE AND JOINT PAIN.   Allergies as of 05/12/2019   No Known Allergies     Medication List       Accurate as of May 12, 2019 12:04 PM. If you have any questions, ask your nurse or doctor.  amLODipine 10 MG tablet Commonly known as: NORVASC Take 1 tablet (10 mg total) by mouth daily.   EYE VITAMINS & MINERALS PO Take by mouth. [facility]   fenofibrate 160 MG tablet Take 1 tablet (160 mg total) by mouth daily.   fish oil-omega-3 fatty acids 1000 MG capsule Take 2 g by mouth daily.   nabumetone 500 MG tablet Commonly known as: RELAFEN TAKE 2 TABLETS BY MOUTH 2 TIMES PER DAY FOR MUSCLE AND JOINT PAIN.   prednisoLONE acetate 1 % ophthalmic suspension Commonly known as: PRED FORTE PLACE 1 DROP INTO THE LEFT EYE 4 TIMES DAILY.   triamterene-hydrochlorothiazide 37.5-25 MG tablet Commonly known as: MAXZIDE-25 TAKE ONE TABLET BY MOUTH ONCE DAILY FOR BLOOD PRESSURE AND FLUID.       Meds ordered this encounter  Medications  . amLODipine (NORVASC) 10 MG tablet    Sig: Take 1 tablet (10 mg total) by mouth daily.    Dispense:  90 tablet    Refill:  1  . fenofibrate 160 MG tablet     Sig: Take 1 tablet (160 mg total) by mouth daily.    Dispense:  90 tablet    Refill:  0  . nabumetone (RELAFEN) 500 MG tablet    Sig: TAKE 2 TABLETS BY MOUTH 2 TIMES PER DAY FOR MUSCLE AND JOINT PAIN.    Dispense:  360 tablet    Refill:  0  . triamterene-hydrochlorothiazide (MAXZIDE-25) 37.5-25 MG tablet    Sig: TAKE ONE TABLET BY MOUTH ONCE DAILY FOR BLOOD PRESSURE AND FLUID.    Dispense:  90 tablet    Refill:  1    Pt. Stable on current regimen. Excellent health for age. Encouraged regular activity, healthy diet.  Follow-up: Return in about 6 months (around 11/09/2019).  Claretta Fraise, M.D.

## 2019-05-13 LAB — CMP14+EGFR
ALT: 10 IU/L (ref 0–44)
AST: 17 IU/L (ref 0–40)
Albumin/Globulin Ratio: 1.4 (ref 1.2–2.2)
Albumin: 4 g/dL (ref 3.5–4.6)
Alkaline Phosphatase: 36 IU/L — ABNORMAL LOW (ref 39–117)
BUN/Creatinine Ratio: 17 (ref 10–24)
BUN: 23 mg/dL (ref 10–36)
Bilirubin Total: 0.3 mg/dL (ref 0.0–1.2)
CO2: 23 mmol/L (ref 20–29)
Calcium: 9.8 mg/dL (ref 8.6–10.2)
Chloride: 100 mmol/L (ref 96–106)
Creatinine, Ser: 1.35 mg/dL — ABNORMAL HIGH (ref 0.76–1.27)
GFR calc Af Amer: 53 mL/min/{1.73_m2} — ABNORMAL LOW (ref >59)
GFR calc non Af Amer: 46 mL/min/{1.73_m2} — ABNORMAL LOW (ref >59)
Globulin, Total: 2.9 g/dL (ref 1.5–4.5)
Glucose: 85 mg/dL (ref 65–99)
Potassium: 3.4 mmol/L — ABNORMAL LOW (ref 3.5–5.2)
Sodium: 140 mmol/L (ref 134–144)
Total Protein: 6.9 g/dL (ref 6.0–8.5)

## 2019-05-13 LAB — LIPID PANEL
Chol/HDL Ratio: 4 ratio (ref 0.0–5.0)
Cholesterol, Total: 124 mg/dL (ref 100–199)
HDL: 31 mg/dL — ABNORMAL LOW (ref >39)
LDL Chol Calc (NIH): 66 mg/dL (ref 0–99)
Triglycerides: 159 mg/dL — ABNORMAL HIGH (ref 0–149)
VLDL Cholesterol Cal: 27 mg/dL (ref 5–40)

## 2019-05-13 LAB — CBC WITH DIFFERENTIAL/PLATELET
Basophils Absolute: 0.1 10*3/uL (ref 0.0–0.2)
Basos: 1 %
EOS (ABSOLUTE): 0.1 10*3/uL (ref 0.0–0.4)
Eos: 2 %
Hematocrit: 33.9 % — ABNORMAL LOW (ref 37.5–51.0)
Hemoglobin: 12.2 g/dL — ABNORMAL LOW (ref 13.0–17.7)
Immature Grans (Abs): 0 10*3/uL (ref 0.0–0.1)
Immature Granulocytes: 0 %
Lymphocytes Absolute: 2.1 10*3/uL (ref 0.7–3.1)
Lymphs: 39 %
MCH: 36.5 pg — ABNORMAL HIGH (ref 26.6–33.0)
MCHC: 36 g/dL — ABNORMAL HIGH (ref 31.5–35.7)
MCV: 102 fL — ABNORMAL HIGH (ref 79–97)
Monocytes Absolute: 0.5 10*3/uL (ref 0.1–0.9)
Monocytes: 9 %
Neutrophils Absolute: 2.7 10*3/uL (ref 1.4–7.0)
Neutrophils: 49 %
Platelets: 157 10*3/uL (ref 150–450)
RBC: 3.34 x10E6/uL — ABNORMAL LOW (ref 4.14–5.80)
RDW: 12.9 % (ref 11.6–15.4)
WBC: 5.5 10*3/uL (ref 3.4–10.8)

## 2019-05-13 LAB — VITAMIN D 25 HYDROXY (VIT D DEFICIENCY, FRACTURES): Vit D, 25-Hydroxy: 21.1 ng/mL — ABNORMAL LOW (ref 30.0–100.0)

## 2019-05-13 LAB — PSA TOTAL (REFLEX TO FREE): Prostate Specific Ag, Serum: 0.6 ng/mL (ref 0.0–4.0)

## 2019-05-14 ENCOUNTER — Other Ambulatory Visit: Payer: Self-pay | Admitting: *Deleted

## 2019-05-14 MED ORDER — VITAMIN D (ERGOCALCIFEROL) 1.25 MG (50000 UNIT) PO CAPS: 50000.0000 [IU] | ORAL_CAPSULE | ORAL | 1 refills | Status: DC

## 2019-06-04 ENCOUNTER — Other Ambulatory Visit: Payer: Self-pay

## 2019-06-04 ENCOUNTER — Encounter (INDEPENDENT_AMBULATORY_CARE_PROVIDER_SITE_OTHER): Payer: Medicare Other | Admitting: Ophthalmology

## 2019-06-04 DIAGNOSIS — H35033 Hypertensive retinopathy, bilateral: Secondary | ICD-10-CM

## 2019-06-04 DIAGNOSIS — H33302 Unspecified retinal break, left eye: Secondary | ICD-10-CM

## 2019-06-04 DIAGNOSIS — H353221 Exudative age-related macular degeneration, left eye, with active choroidal neovascularization: Secondary | ICD-10-CM

## 2019-06-04 DIAGNOSIS — H353112 Nonexudative age-related macular degeneration, right eye, intermediate dry stage: Secondary | ICD-10-CM

## 2019-06-04 DIAGNOSIS — I10 Essential (primary) hypertension: Secondary | ICD-10-CM

## 2019-06-04 DIAGNOSIS — H43813 Vitreous degeneration, bilateral: Secondary | ICD-10-CM

## 2019-06-21 DIAGNOSIS — Z85828 Personal history of other malignant neoplasm of skin: Secondary | ICD-10-CM | POA: Diagnosis not present

## 2019-06-21 DIAGNOSIS — L821 Other seborrheic keratosis: Secondary | ICD-10-CM | POA: Diagnosis not present

## 2019-06-21 DIAGNOSIS — L819 Disorder of pigmentation, unspecified: Secondary | ICD-10-CM | POA: Diagnosis not present

## 2019-06-21 DIAGNOSIS — D485 Neoplasm of uncertain behavior of skin: Secondary | ICD-10-CM | POA: Diagnosis not present

## 2019-06-21 DIAGNOSIS — L820 Inflamed seborrheic keratosis: Secondary | ICD-10-CM | POA: Diagnosis not present

## 2019-07-20 DIAGNOSIS — H52203 Unspecified astigmatism, bilateral: Secondary | ICD-10-CM | POA: Diagnosis not present

## 2019-09-20 ENCOUNTER — Other Ambulatory Visit: Payer: Self-pay | Admitting: Family Medicine

## 2019-09-20 DIAGNOSIS — I10 Essential (primary) hypertension: Secondary | ICD-10-CM

## 2019-10-11 ENCOUNTER — Other Ambulatory Visit: Payer: Self-pay | Admitting: Family Medicine

## 2019-10-11 DIAGNOSIS — I10 Essential (primary) hypertension: Secondary | ICD-10-CM

## 2019-11-02 ENCOUNTER — Other Ambulatory Visit: Payer: Self-pay | Admitting: Family Medicine

## 2019-11-09 ENCOUNTER — Other Ambulatory Visit: Payer: Self-pay | Admitting: Family Medicine

## 2019-11-09 DIAGNOSIS — E782 Mixed hyperlipidemia: Secondary | ICD-10-CM

## 2019-12-07 ENCOUNTER — Encounter (INDEPENDENT_AMBULATORY_CARE_PROVIDER_SITE_OTHER): Payer: Medicare Other | Admitting: Ophthalmology

## 2019-12-07 ENCOUNTER — Other Ambulatory Visit: Payer: Self-pay

## 2019-12-07 DIAGNOSIS — H353111 Nonexudative age-related macular degeneration, right eye, early dry stage: Secondary | ICD-10-CM | POA: Diagnosis not present

## 2019-12-07 DIAGNOSIS — H353221 Exudative age-related macular degeneration, left eye, with active choroidal neovascularization: Secondary | ICD-10-CM | POA: Diagnosis not present

## 2019-12-07 DIAGNOSIS — H35033 Hypertensive retinopathy, bilateral: Secondary | ICD-10-CM

## 2019-12-07 DIAGNOSIS — I10 Essential (primary) hypertension: Secondary | ICD-10-CM | POA: Diagnosis not present

## 2019-12-07 DIAGNOSIS — H33302 Unspecified retinal break, left eye: Secondary | ICD-10-CM

## 2019-12-07 DIAGNOSIS — H43813 Vitreous degeneration, bilateral: Secondary | ICD-10-CM

## 2019-12-08 DIAGNOSIS — Z20822 Contact with and (suspected) exposure to covid-19: Secondary | ICD-10-CM | POA: Diagnosis not present

## 2019-12-20 ENCOUNTER — Other Ambulatory Visit: Payer: Self-pay | Admitting: Family Medicine

## 2019-12-20 DIAGNOSIS — I10 Essential (primary) hypertension: Secondary | ICD-10-CM

## 2019-12-22 ENCOUNTER — Emergency Department (HOSPITAL_COMMUNITY)
Admission: EM | Admit: 2019-12-22 | Discharge: 2019-12-22 | Disposition: A | Discharge status: Home / Self Care | Payer: Medicare Other | Attending: Emergency Medicine | Admitting: Emergency Medicine

## 2019-12-22 ENCOUNTER — Emergency Department (HOSPITAL_COMMUNITY): Payer: Medicare Other

## 2019-12-22 ENCOUNTER — Encounter (HOSPITAL_COMMUNITY): Payer: Self-pay | Admitting: Cardiology

## 2019-12-22 DIAGNOSIS — R58 Hemorrhage, not elsewhere classified: Secondary | ICD-10-CM | POA: Diagnosis not present

## 2019-12-22 DIAGNOSIS — G319 Degenerative disease of nervous system, unspecified: Secondary | ICD-10-CM | POA: Diagnosis not present

## 2019-12-22 DIAGNOSIS — Y998 Other external cause status: Secondary | ICD-10-CM | POA: Diagnosis not present

## 2019-12-22 DIAGNOSIS — Y9289 Other specified places as the place of occurrence of the external cause: Secondary | ICD-10-CM | POA: Diagnosis not present

## 2019-12-22 DIAGNOSIS — Z23 Encounter for immunization: Secondary | ICD-10-CM | POA: Insufficient documentation

## 2019-12-22 DIAGNOSIS — I10 Essential (primary) hypertension: Secondary | ICD-10-CM | POA: Diagnosis not present

## 2019-12-22 DIAGNOSIS — W108XXA Fall (on) (from) other stairs and steps, initial encounter: Secondary | ICD-10-CM | POA: Diagnosis not present

## 2019-12-22 DIAGNOSIS — I672 Cerebral atherosclerosis: Secondary | ICD-10-CM | POA: Diagnosis not present

## 2019-12-22 DIAGNOSIS — Z79899 Other long term (current) drug therapy: Secondary | ICD-10-CM | POA: Diagnosis not present

## 2019-12-22 DIAGNOSIS — S0101XA Laceration without foreign body of scalp, initial encounter: Secondary | ICD-10-CM | POA: Diagnosis not present

## 2019-12-22 DIAGNOSIS — Z87891 Personal history of nicotine dependence: Secondary | ICD-10-CM | POA: Insufficient documentation

## 2019-12-22 DIAGNOSIS — W19XXXA Unspecified fall, initial encounter: Secondary | ICD-10-CM | POA: Diagnosis not present

## 2019-12-22 DIAGNOSIS — S0000XA Unspecified superficial injury of scalp, initial encounter: Secondary | ICD-10-CM | POA: Diagnosis present

## 2019-12-22 DIAGNOSIS — S0990XA Unspecified injury of head, initial encounter: Secondary | ICD-10-CM | POA: Diagnosis not present

## 2019-12-22 DIAGNOSIS — R52 Pain, unspecified: Secondary | ICD-10-CM | POA: Diagnosis not present

## 2019-12-22 DIAGNOSIS — Y9389 Activity, other specified: Secondary | ICD-10-CM | POA: Insufficient documentation

## 2019-12-22 DIAGNOSIS — I6782 Cerebral ischemia: Secondary | ICD-10-CM | POA: Diagnosis not present

## 2019-12-22 MED ORDER — TETANUS-DIPHTH-ACELL PERTUSSIS 5-2.5-18.5 LF-MCG/0.5 IM SUSP
0.5000 mL | Freq: Once | INTRAMUSCULAR | Status: AC
  Administered 2019-12-22: 0.5 mL via INTRAMUSCULAR
  Filled 2019-12-22: qty 0.5

## 2019-12-22 NOTE — ED Provider Notes (Signed)
[facility] EMERGENCY DEPARTMENT Provider Note   CSN: [id number] Arrival date & time: 12/22/19  1548     History Chief Complaint  Patient presents with  . Fall    Brett Blankenship is a 86 y.o. male.  HPI     86 year old male comes in a chief complaint of fall.  Patient has history of hypertension.  He is not on any blood thinners.  Per patient he lost balance while coming down the steps and fell.  His head struck onto a bush/tree trunk.  Patient did not lose consciousness.  He is denying any severe headache, neck pain, numbness, tingling, weakness.  He denies any chest pain, extremity pain.  Patient was brought here by his son with bleeding from his scalp that has now been controlled.  Tetanus status unknown.  Past Medical History:  Diagnosis Date  . HYPERTENSION 07/12/2009    Patient Active Problem List   Diagnosis Date Noted  . Systolic murmur 61/95/0932  . Bradycardia 12/01/2016  . Chronic pain of left knee 10/10/2016  . Essential hypertension 07/12/2009    Past Surgical History:  Procedure Laterality Date  . APPENDECTOMY  1960       Family History  Problem Relation Age of Onset  . Hypertension Father   . Cancer Brother        colon, lung    Social History   Tobacco Use  . Smoking status: Former Smoker    Packs/day: 0.30    Years: 5.00    Pack years: 1.50    Types: Cigarettes    Quit date: 09/12/1963    Years since quitting: 56.3  . Smokeless tobacco: Never Used  Vaping Use  . Vaping Use: Never used  Substance Use Topics  . Alcohol use: Not Currently  . Drug use: Not Currently    Home Medications Prior to Admission medications   Medication Sig Start Date End Date Taking? Authorizing Provider  amLODipine (NORVASC) 10 MG tablet Take 1 tablet (10 mg total) by mouth daily. (Needs to be seen before next refill) 12/20/19   Claretta Fraise, MD  fenofibrate 160 MG tablet Take 1 tablet (160 mg total) by mouth daily. (Needs to be seen before next refill)  11/09/19   Claretta Fraise, MD  fish oil-omega-3 fatty acids 1000 MG capsule Take 2 g by mouth daily.      [provider]  Multiple Vitamins-Minerals (EYE VITAMINS & MINERALS PO) Take by mouth. [facility]    [provider]  nabumetone (RELAFEN) 500 MG tablet TAKE 2 TABLETS BY MOUTH 2 TIMES PER DAY FOR MUSCLE AND JOINT PAIN. 05/12/19   Claretta Fraise, MD  prednisoLONE acetate (PRED FORTE) 1 % ophthalmic suspension PLACE 1 DROP INTO THE LEFT EYE 4 TIMES DAILY. 10/12/19   Claretta Fraise, MD  triamterene-hydrochlorothiazide (MAXZIDE-25) 37.5-25 MG tablet TAKE ONE TABLET BY MOUTH ONCE DAILY FOR BLOOD PRESSURE AND FLUID. (Needs to be seen before next refill) 10/11/19   Claretta Fraise, MD  Vitamin D, Ergocalciferol, (DRISDOL) 1.25 MG (50000 UNIT) CAPS capsule Take 1 capsule (50,000 Units total) by mouth every 7 (seven) days. 05/14/19   Claretta Fraise, MD    Allergies    Patient has no known allergies.  Review of Systems   Review of Systems  Constitutional: Positive for activity change.  Respiratory: Negative for shortness of breath.   Cardiovascular: Negative for chest pain.  Gastrointestinal: Negative for nausea and vomiting.  Skin: Positive for wound.  Hematological: Does not bruise/bleed easily.  Physical Exam Updated Vital Signs BP (!) 155/71 (BP Location: Right Arm)   Pulse 72   Temp 98.1 F (36.7 C) (Oral)   Resp 15   Ht 5' 8.5" (1.74 m)   Wt 86.2 kg   SpO2 98%   BMI 28.47 kg/m   Physical Exam Vitals and nursing note reviewed.  Constitutional:      Appearance: He is well-developed.  HENT:     Head: Atraumatic.  Cardiovascular:     Rate and Rhythm: Normal rate.  Pulmonary:     Effort: Pulmonary effort is normal.  Musculoskeletal:        General: No tenderness, deformity or signs of injury. Normal range of motion.     Cervical back: Neck supple.  Skin:    General: Skin is warm.     Comments: 6 cm laceration to the scalp  Neurological:     Mental  Status: He is alert and oriented to person, place, and time.     ED Results / Procedures / Treatments   Labs (all labs ordered are listed, but only abnormal results are displayed) Labs Reviewed - No data to display  EKG None  Radiology No results found.  Procedures Procedures (including critical care time)  Medications Ordered in ED Medications  Tdap (BOOSTRIX) injection 0.5 mL (has no administration in time range)    ED Course  I have reviewed the triage vital signs and the nursing notes.  Pertinent labs & imaging results that were available during my care of the patient were reviewed by me and considered in my medical decision making (see chart for details).    MDM Rules/Calculators/A&P                          86 year old comes in a chief complaint of fall. Patient is noted to have scalp laceration.  He is not on blood thinners but we will have to get a CT brain to ensure there is no intracranial bleed.  C-spine cleared clinically.  Patient's torso exam, respiratory exam, musculoskeletal exam is otherwise reassuring and I do not think further imaging is needed.  Tetanus to be updated.  Patient will need staples to the scalp.  Final Clinical Impression(s) / ED Diagnoses Final diagnoses:  Scalp laceration, initial encounter  Fall, initial encounter    Rx / DC Orders ED Discharge Orders    None       Varney Biles, MD 12/22/19 [phone]

## 2019-12-22 NOTE — ED Triage Notes (Signed)
Per EMS pt lost his balance going down the steps.  Fell down approximately 5 steps and hit his head on a small tree trunk.  Pt has laceration to top of  head.  C/o Pain at incision 2/10.  Alert and oriented times 4.

## 2019-12-22 NOTE — Discharge Instructions (Addendum)
We saw you in the ER after you had a fall.  There is a laceration to your scalp that was repaired with staples.  The staple need to come out in 7 days by your PCP.  CT scan of your brain is normal. Take Tylenol for any pain.  Keep the wound site clean and dry.

## 2019-12-31 ENCOUNTER — Ambulatory Visit (INDEPENDENT_AMBULATORY_CARE_PROVIDER_SITE_OTHER): Payer: Medicare Other | Admitting: Family Medicine

## 2019-12-31 ENCOUNTER — Other Ambulatory Visit: Payer: Self-pay

## 2019-12-31 ENCOUNTER — Encounter: Payer: Self-pay | Admitting: Family Medicine

## 2019-12-31 VITALS — BP 134/57 | HR 63 | Temp 97.2°F | Ht 68.5 in | Wt 199.0 lb

## 2019-12-31 DIAGNOSIS — S0101XD Laceration without foreign body of scalp, subsequent encounter: Secondary | ICD-10-CM | POA: Diagnosis not present

## 2019-12-31 NOTE — Progress Notes (Signed)
Assessment & Plan:  1. Laceration of scalp, subsequent encounter - Laceration cleansed with normal saline.  6 staples removed without complication.  Patient tolerated well.   Follow up plan: Return as scheduled.  Hendricks Limes, MSN, APRN, FNP-C [facility]  Subjective:   Patient ID: Brett Blankenship, male    DOB: Nov 21, 1926, 86 y.o.   MRN: [id number]  HPI: Brett Blankenship is a 86 y.o. male presenting on 12/31/2019 for Suture / Staple Removal (Staple removal on head)  Patient is here for staple removal from his scalp.  He was seen at [facility], ER on 12/22/2019 after falling down some stairs and hitting his head.   ROS: Negative unless specifically indicated above in HPI.   Relevant past medical history reviewed and updated as indicated.   Allergies and medications reviewed and updated.   Current Outpatient Medications:  .  amLODipine (NORVASC) 10 MG tablet, Take 1 tablet (10 mg total) by mouth daily. (Needs to be seen before next refill), Disp: 30 tablet, Rfl: 0 .  fenofibrate 160 MG tablet, Take 1 tablet (160 mg total) by mouth daily. (Needs to be seen before next refill), Disp: 30 tablet, Rfl: 0 .  fish oil-omega-3 fatty acids 1000 MG capsule, Take 2 g by mouth daily.  , Disp: , Rfl:  .  Multiple Vitamins-Minerals (EYE VITAMINS & MINERALS PO), Take by mouth. [facility], Disp: , Rfl:  .  nabumetone (RELAFEN) 500 MG tablet, TAKE 2 TABLETS BY MOUTH 2 TIMES PER DAY FOR MUSCLE AND JOINT PAIN., Disp: 360 tablet, Rfl: 0 .  prednisoLONE acetate (PRED FORTE) 1 % ophthalmic suspension, PLACE 1 DROP INTO THE LEFT EYE 4 TIMES DAILY., Disp: 10 mL, Rfl: 0 .  triamterene-hydrochlorothiazide (MAXZIDE-25) 37.5-25 MG tablet, TAKE ONE TABLET BY MOUTH ONCE DAILY FOR BLOOD PRESSURE AND FLUID. (Needs to be seen before next refill), Disp: 30 tablet, Rfl: 0 .  Vitamin D, Ergocalciferol, (DRISDOL) 1.25 MG (50000 UNIT) CAPS capsule, Take 1 capsule (50,000 Units total) by mouth  every 7 (seven) days., Disp: 13 capsule, Rfl: 1  No Known Allergies  Objective:   BP (!) 134/57   Pulse 63   Temp (!) 97.2 F (36.2 C) (Temporal)   Ht 5' 8.5" (1.74 m)   Wt 199 lb (90.3 kg)   SpO2 99%   BMI 29.82 kg/m    Physical Exam Vitals reviewed.  Constitutional:      General: He is not in acute distress.    Appearance: Normal appearance. He is not ill-appearing, toxic-appearing or diaphoretic.  HENT:     Head: Normocephalic and atraumatic.  Eyes:     General: No scleral icterus.       Right eye: No discharge.        Left eye: No discharge.     Conjunctiva/sclera: Conjunctivae normal.  Cardiovascular:     Rate and Rhythm: Normal rate.  Pulmonary:     Effort: Pulmonary effort is normal. No respiratory distress.  Musculoskeletal:        General: Normal range of motion.     Cervical back: Normal range of motion.  Skin:    General: Skin is warm and dry.     Findings: Laceration (well approximated with 6 staples to top of scalp without erythema, warmth or drainage) present.  Neurological:     Mental Status: He is alert and oriented to person, place, and time. Mental status is at baseline.  Psychiatric:        Mood  and Affect: Mood normal.        Behavior: Behavior normal.        Thought Content: Thought content normal.        Judgment: Judgment normal.

## 2020-01-10 ENCOUNTER — Ambulatory Visit: Payer: Medicare Other | Admitting: Family Medicine

## 2020-01-12 ENCOUNTER — Other Ambulatory Visit: Payer: Self-pay | Admitting: Family Medicine

## 2020-01-12 DIAGNOSIS — M25562 Pain in left knee: Secondary | ICD-10-CM

## 2020-01-12 DIAGNOSIS — G8929 Other chronic pain: Secondary | ICD-10-CM

## 2020-01-20 ENCOUNTER — Other Ambulatory Visit: Payer: Self-pay

## 2020-01-20 ENCOUNTER — Encounter: Payer: Self-pay | Admitting: Family Medicine

## 2020-01-20 ENCOUNTER — Ambulatory Visit (INDEPENDENT_AMBULATORY_CARE_PROVIDER_SITE_OTHER): Payer: Medicare Other | Admitting: Family Medicine

## 2020-01-20 VITALS — BP 132/68 | HR 70 | Temp 97.7°F | Resp 20 | Ht 68.5 in | Wt 200.0 lb

## 2020-01-20 DIAGNOSIS — R011 Cardiac murmur, unspecified: Secondary | ICD-10-CM

## 2020-01-20 DIAGNOSIS — M25562 Pain in left knee: Secondary | ICD-10-CM

## 2020-01-20 DIAGNOSIS — G8929 Other chronic pain: Secondary | ICD-10-CM

## 2020-01-20 DIAGNOSIS — E782 Mixed hyperlipidemia: Secondary | ICD-10-CM | POA: Diagnosis not present

## 2020-01-20 DIAGNOSIS — I10 Essential (primary) hypertension: Secondary | ICD-10-CM | POA: Diagnosis not present

## 2020-01-20 DIAGNOSIS — E559 Vitamin D deficiency, unspecified: Secondary | ICD-10-CM | POA: Diagnosis not present

## 2020-01-20 DIAGNOSIS — Z23 Encounter for immunization: Secondary | ICD-10-CM

## 2020-01-20 NOTE — Progress Notes (Signed)
Subjective:  Patient ID: Brett Blankenship, male    DOB: 07-03-1926  Age: 86 y.o. MRN: [id number]  CC: Medical Management of Chronic Issues   HPI Brett Blankenship presents for  follow-up of hypertension. Patient has no history of headache chest pain or shortness of breath or recent cough. Patient also denies symptoms of TIA such as focal numbness or weakness. Patient denies side effects from medication. States taking it regularly. Patient states that he cannot tolerate statin.  As a result he is taking fenofibrate for his cholesterol.  He denies chest pain or shortness of breath.  He has not had any unilateral loss of strength or sensation.  He continues to take the medication without any known side effects.   History Brett Blankenship has a past medical history of HYPERTENSION (07/12/2009).   He has a past surgical history that includes Appendectomy (1960).   His family history includes Cancer in his brother; Hypertension in his father.He reports that he quit smoking about 56 years ago. His smoking use included cigarettes. He has a 1.50 pack-year smoking history. He has never used smokeless tobacco. He reports previous alcohol use. He reports previous drug use.  Current Outpatient Medications on File Prior to Visit  Medication Sig Dispense Refill   fish oil-omega-3 fatty acids 1000 MG capsule Take 2 g by mouth daily.       Multiple Vitamins-Minerals (EYE VITAMINS & MINERALS PO) Take by mouth. [facility]     prednisoLONE acetate (PRED FORTE) 1 % ophthalmic suspension PLACE 1 DROP INTO THE LEFT EYE 4 TIMES DAILY. 10 mL 0   No current facility-administered medications on file prior to visit.    ROS Review of Systems  Objective:  BP 132/68    Pulse 70    Temp 97.7 F (36.5 C) (Temporal)    Resp 20    Ht 5' 8.5" (1.74 m)    Wt 200 lb (90.7 kg)    SpO2 98%    BMI 29.97 kg/m   BP Readings from Last 3 Encounters:  01/20/20 132/68  12/31/19 (!) 134/57  12/22/19 (!) 170/72    Wt Readings  from Last 3 Encounters:  01/20/20 200 lb (90.7 kg)  12/31/19 199 lb (90.3 kg)  12/22/19 190 lb (86.2 kg)     Physical Exam Constitutional:      General: He is not in acute distress.    Appearance: He is well-developed.  HENT:     Head: Normocephalic and atraumatic.     Right Ear: External ear normal.     Left Ear: External ear normal.     Nose: Nose normal.  Eyes:     Conjunctiva/sclera: Conjunctivae normal.     Pupils: Pupils are equal, round, and reactive to light.  Cardiovascular:     Rate and Rhythm: Normal rate and regular rhythm.     Heart sounds: Murmur heard.   Pulmonary:     Effort: Pulmonary effort is normal. No respiratory distress.     Breath sounds: Normal breath sounds. No wheezing or rales.  Abdominal:     Palpations: Abdomen is soft.     Tenderness: There is no abdominal tenderness.  Musculoskeletal:        General: Normal range of motion.     Cervical back: Normal range of motion and neck supple.  Skin:    General: Skin is warm and dry.  Neurological:     Mental Status: He is alert and oriented to person, place, and time.  Deep Tendon Reflexes: Reflexes are normal and symmetric.  Psychiatric:        Behavior: Behavior normal.        Thought Content: Thought content normal.        Judgment: Judgment normal.       Assessment & Plan:   Brett Blankenship was seen today for medical management of chronic issues.  Diagnoses and all orders for this visit:  Essential hypertension -     CBC with Differential/Platelet -     CMP14+EGFR -     amLODipine (NORVASC) 10 MG tablet; Take 1 tablet (10 mg total) by mouth daily. (Needs to be seen before next refill) -     triamterene-hydrochlorothiazide (MAXZIDE-25) 37.5-25 MG tablet; TAKE ONE TABLET BY MOUTH ONCE DAILY FOR BLOOD PRESSURE AND FLUID. (Needs to be seen before next refill)  Systolic murmur -     CBC with Differential/Platelet -     CMP14+EGFR  Hyperlipidemia, mixed -     CBC with  Differential/Platelet -     CMP14+EGFR -     Lipid panel -     fenofibrate 160 MG tablet; Take 1 tablet (160 mg total) by mouth daily. (Needs to be seen before next refill)  Vitamin D deficiency -     CBC with Differential/Platelet -     CMP14+EGFR -     VITAMIN D 25 Hydroxy (Vit-D Deficiency, Fractures)  Chronic pain of left knee -     CBC with Differential/Platelet -     CMP14+EGFR -     nabumetone (RELAFEN) 500 MG tablet; TAKE 2 TABLETS BY MOUTH TWICE DAILY AS NEEDED FOR PAIN.  Other orders -     Pneumococcal polysaccharide vaccine 23-valent greater than or equal to 2yo subcutaneous/IM -     Vitamin D, Ergocalciferol, (DRISDOL) 1.25 MG (50000 UNIT) CAPS capsule; Take 1 capsule (50,000 Units total) by mouth every 7 (seven) days.   Allergies as of 01/20/2020   No Known Allergies     Medication List       Accurate as of January 20, 2020 11:59 PM. If you have any questions, ask your nurse or doctor.        amLODipine 10 MG tablet Commonly known as: NORVASC Take 1 tablet (10 mg total) by mouth daily. (Needs to be seen before next refill)   EYE VITAMINS & MINERALS PO Take by mouth. [facility]   fenofibrate 160 MG tablet Take 1 tablet (160 mg total) by mouth daily. (Needs to be seen before next refill)   fish oil-omega-3 fatty acids 1000 MG capsule Take 2 g by mouth daily.   nabumetone 500 MG tablet Commonly known as: RELAFEN TAKE 2 TABLETS BY MOUTH TWICE DAILY AS NEEDED FOR PAIN.   prednisoLONE acetate 1 % ophthalmic suspension Commonly known as: PRED FORTE PLACE 1 DROP INTO THE LEFT EYE 4 TIMES DAILY.   triamterene-hydrochlorothiazide 37.5-25 MG tablet Commonly known as: MAXZIDE-25 TAKE ONE TABLET BY MOUTH ONCE DAILY FOR BLOOD PRESSURE AND FLUID. (Needs to be seen before next refill)   Vitamin D (Ergocalciferol) 1.25 MG (50000 UNIT) Caps capsule Commonly known as: DRISDOL Take 1 capsule (50,000 Units total) by mouth every 7 (seven) days.       Meds  ordered this encounter  Medications   amLODipine (NORVASC) 10 MG tablet    Sig: Take 1 tablet (10 mg total) by mouth daily. (Needs to be seen before next refill)    Dispense:  90 tablet    Refill:  1  fenofibrate 160 MG tablet    Sig: Take 1 tablet (160 mg total) by mouth daily. (Needs to be seen before next refill)    Dispense:  90 tablet    Refill:  1   nabumetone (RELAFEN) 500 MG tablet    Sig: TAKE 2 TABLETS BY MOUTH TWICE DAILY AS NEEDED FOR PAIN.    Dispense:  360 tablet    Refill:  1   triamterene-hydrochlorothiazide (MAXZIDE-25) 37.5-25 MG tablet    Sig: TAKE ONE TABLET BY MOUTH ONCE DAILY FOR BLOOD PRESSURE AND FLUID. (Needs to be seen before next refill)    Dispense:  90 tablet    Refill:  1   Vitamin D, Ergocalciferol, (DRISDOL) 1.25 MG (50000 UNIT) CAPS capsule    Sig: Take 1 capsule (50,000 Units total) by mouth every 7 (seven) days.    Dispense:  13 capsule    Refill:  1      Follow-up: Return in about 6 months (around 07/19/2020).  Claretta Fraise, M.D.

## 2020-01-21 ENCOUNTER — Encounter: Payer: Self-pay | Admitting: Family Medicine

## 2020-01-21 LAB — CBC WITH DIFFERENTIAL/PLATELET
Basophils Absolute: 0 10*3/uL (ref 0.0–0.2)
Basos: 0 %
EOS (ABSOLUTE): 0.2 10*3/uL (ref 0.0–0.4)
Eos: 2 %
Hematocrit: 35.2 % — ABNORMAL LOW (ref 37.5–51.0)
Hemoglobin: 12.2 g/dL — ABNORMAL LOW (ref 13.0–17.7)
Immature Grans (Abs): 0 10*3/uL (ref 0.0–0.1)
Immature Granulocytes: 0 %
Lymphocytes Absolute: 2.3 10*3/uL (ref 0.7–3.1)
Lymphs: 30 %
MCH: 34.9 pg — ABNORMAL HIGH (ref 26.6–33.0)
MCHC: 34.7 g/dL (ref 31.5–35.7)
MCV: 101 fL — ABNORMAL HIGH (ref 79–97)
Monocytes Absolute: 0.6 10*3/uL (ref 0.1–0.9)
Monocytes: 7 %
Neutrophils Absolute: 4.5 10*3/uL (ref 1.4–7.0)
Neutrophils: 61 %
Platelets: 153 10*3/uL (ref 150–450)
RBC: 3.5 x10E6/uL — ABNORMAL LOW (ref 4.14–5.80)
RDW: 12.7 % (ref 11.6–15.4)
WBC: 7.5 10*3/uL (ref 3.4–10.8)

## 2020-01-21 LAB — CMP14+EGFR
ALT: 10 IU/L (ref 0–44)
AST: 15 IU/L (ref 0–40)
Albumin/Globulin Ratio: 1.3 (ref 1.2–2.2)
Albumin: 3.9 g/dL (ref 3.5–4.6)
Alkaline Phosphatase: 37 IU/L — ABNORMAL LOW (ref 44–121)
BUN/Creatinine Ratio: 16 (ref 10–24)
BUN: 22 mg/dL (ref 10–36)
Bilirubin Total: 0.3 mg/dL (ref 0.0–1.2)
CO2: 23 mmol/L (ref 20–29)
Calcium: 9.7 mg/dL (ref 8.6–10.2)
Chloride: 105 mmol/L (ref 96–106)
Creatinine, Ser: 1.39 mg/dL — ABNORMAL HIGH (ref 0.76–1.27)
GFR calc Af Amer: 51 mL/min/{1.73_m2} — ABNORMAL LOW (ref >59)
GFR calc non Af Amer: 44 mL/min/{1.73_m2} — ABNORMAL LOW (ref >59)
Globulin, Total: 3.1 g/dL (ref 1.5–4.5)
Glucose: 97 mg/dL (ref 65–99)
Potassium: 3.6 mmol/L (ref 3.5–5.2)
Sodium: 142 mmol/L (ref 134–144)
Total Protein: 7 g/dL (ref 6.0–8.5)

## 2020-01-21 LAB — LIPID PANEL
Chol/HDL Ratio: 5.1 ratio — ABNORMAL HIGH (ref 0.0–5.0)
Cholesterol, Total: 142 mg/dL (ref 100–199)
HDL: 28 mg/dL — ABNORMAL LOW (ref >39)
LDL Chol Calc (NIH): 78 mg/dL (ref 0–99)
Triglycerides: 215 mg/dL — ABNORMAL HIGH (ref 0–149)
VLDL Cholesterol Cal: 36 mg/dL (ref 5–40)

## 2020-01-21 LAB — VITAMIN D 25 HYDROXY (VIT D DEFICIENCY, FRACTURES): Vit D, 25-Hydroxy: 22.8 ng/mL — ABNORMAL LOW (ref 30.0–100.0)

## 2020-01-21 MED ORDER — TRIAMTERENE-HCTZ 37.5-25 MG PO TABS: ORAL_TABLET | ORAL | 1 refills | Status: DC

## 2020-01-21 MED ORDER — FENOFIBRATE 160 MG PO TABS: 160.0000 mg | ORAL_TABLET | Freq: Every day | ORAL | 1 refills | Status: DC

## 2020-01-21 MED ORDER — VITAMIN D (ERGOCALCIFEROL) 1.25 MG (50000 UNIT) PO CAPS: 50000.0000 [IU] | ORAL_CAPSULE | ORAL | 1 refills | Status: DC

## 2020-01-21 MED ORDER — NABUMETONE 500 MG PO TABS: ORAL_TABLET | ORAL | 1 refills | Status: DC

## 2020-01-21 MED ORDER — AMLODIPINE BESYLATE 10 MG PO TABS: 10.0000 mg | ORAL_TABLET | Freq: Every day | ORAL | 1 refills | Status: DC

## 2020-02-24 ENCOUNTER — Ambulatory Visit (INDEPENDENT_AMBULATORY_CARE_PROVIDER_SITE_OTHER): Payer: Medicare Other | Admitting: *Deleted

## 2020-02-24 DIAGNOSIS — Z0000 Encounter for general adult medical examination without abnormal findings: Secondary | ICD-10-CM | POA: Diagnosis not present

## 2020-02-24 NOTE — Progress Notes (Addendum)
MEDICARE ANNUAL WELLNESS VISIT  02/24/2020  Telephone Visit Disclaimer This Medicare AWV was conducted by telephone due to national recommendations for restrictions regarding the COVID-19 Pandemic (e.g. social distancing).  I verified, using two identifiers, that I am speaking with Brett Blankenship or their authorized healthcare agent. I discussed the limitations, risks, security, and privacy concerns of performing an evaluation and management service by telephone and the potential availability of an in-person appointment in the future. The patient expressed understanding and agreed to proceed.   Location of Patient: home Location of Provider (nurse): [facility]  Subjective:    Brett Blankenship is a 86 y.o. male patient of Stacks, Cletus Gash, MD who had a Medicare Annual Wellness Visit today via telephone. Brett Blankenship is retired and lives alone. He has 2 sons. He reports that he is socially active and does interact with friends/family regularly. He is minimally physically active and enjoys gardening.  Patient Care Team: Claretta Fraise, MD as PCP - General (Family Medicine)  Advanced Directives 02/24/2020 02/22/2019 12/14/2018 12/04/2018 11/18/2017 07/16/2016 06/25/2016  Does Patient Have a Medical Advance Directive? Yes Yes Yes Yes No Yes Yes  Type of Paramedic of [place];Living will Healthcare Power of [place];Living will - - -  Does patient want to make changes to medical advance directive? - No - Patient declined - - - - -  Copy of [place] in Chart? No - copy requested No - copy requested - - - - -  Would patient like information on creating a medical advance directive? No - Patient declined - - - Yes (MAU/Ambulatory/Procedural Areas - Information given) - -    Hospital Utilization Over the Past 12 Months: # of hospitalizations or ER visits: 1 # of surgeries: 0  Review of Systems    Patient reports that his  overall health is unchanged compared to last year.  History obtained from the patient  Patient Reported Readings (BP, Pulse, CBG, Weight, etc) none  Pain Assessment Pain : No/denies pain     Current Medications & Allergies (verified) Allergies as of 02/24/2020   No Known Allergies      Medication List        Accurate as of February 24, 2020  9:09 AM. If you have any questions, ask your nurse or doctor.          amLODipine 10 MG tablet Commonly known as: NORVASC Take 1 tablet (10 mg total) by mouth daily. (Needs to be seen before next refill)   EYE VITAMINS & MINERALS PO Take by mouth. [facility]   fenofibrate 160 MG tablet Take 1 tablet (160 mg total) by mouth daily. (Needs to be seen before next refill)   fish oil-omega-3 fatty acids 1000 MG capsule Take 2 g by mouth daily.   nabumetone 500 MG tablet Commonly known as: RELAFEN TAKE 2 TABLETS BY MOUTH TWICE DAILY AS NEEDED FOR PAIN.   prednisoLONE acetate 1 % ophthalmic suspension Commonly known as: PRED FORTE PLACE 1 DROP INTO THE LEFT EYE 4 TIMES DAILY.   triamterene-hydrochlorothiazide 37.5-25 MG tablet Commonly known as: MAXZIDE-25 TAKE ONE TABLET BY MOUTH ONCE DAILY FOR BLOOD PRESSURE AND FLUID. (Needs to be seen before next refill)   Vitamin D (Ergocalciferol) 1.25 MG (50000 UNIT) Caps capsule Commonly known as: DRISDOL Take 1 capsule (50,000 Units total) by mouth every 7 (seven) days.        History (reviewed): Past Medical History:  Diagnosis Date  HYPERTENSION 07/12/2009   Past Surgical History:  Procedure Laterality Date   APPENDECTOMY  1960   Family History  Problem Relation Age of Onset   Hypertension Father    Heart disease Father    Cancer Brother        colon, lung   Social History   Socioeconomic History   Marital status: Widowed    Spouse name: Not on file   Number of children: 2   Years of education: 7   Highest education level: 7th grade  Occupational History    Occupation: retired  Tobacco Use   Smoking status: Former Smoker    Packs/day: 0.30    Years: 5.00    Pack years: 1.50    Types: Cigarettes    Quit date: 09/12/1963    Years since quitting: 56.4   Smokeless tobacco: Never Used  Scientific laboratory technician Use: Never used  Substance and Sexual Activity   Alcohol use: Not Currently   Drug use: Not Currently   Sexual activity: Not Currently  Other Topics Concern   Not on file  Social History Narrative   Not on file   Social Determinants of Health   Financial Resource Strain:    Difficulty of Paying Living Expenses: Not on file  Food Insecurity:    Worried About Charity fundraiser in the Last Year: Not on file   [place] in the Last Year: Not on file  Transportation Needs:    Lack of Transportation (Medical): Not on file   Lack of Transportation (Non-Medical): Not on file  Physical Activity:    Days of Exercise per Week: Not on file   Minutes of Exercise per Session: Not on file  Stress:    Feeling of Stress : Not on file  Social Connections:    Frequency of Communication with Friends and Family: Not on file   Frequency of Social Gatherings with Friends and Family: Not on file   Attends Religious Services: Not on file   Active Member of Clubs or Organizations: Not on file   Attends Archivist Meetings: Not on file   Marital Status: Not on file    Activities of Daily Living In your present state of health, do you have any difficulty performing the following activities: 02/24/2020  Hearing? Y  Vision? Y  Comment states glasses arent accurate  Difficulty concentrating or making decisions? N  Walking or climbing stairs? Y  Comment just slow  Dressing or bathing? N  Doing errands, shopping? Y  Comment does not Physiological scientist and eating ? N  Using the Toilet? N  In the past six months, have you accidently leaked urine? N  Do you have problems with loss of bowel control? N  Managing your Medications? N   Managing your Finances? N  Housekeeping or managing your Housekeeping? N  Some recent data might be hidden    Patient Education/ Literacy How often do you need to have someone help you when you read instructions, pamphlets, or other written materials from your doctor or pharmacy?: 1 - Never What is the last grade level you completed in school?: 7th  Exercise Current Exercise Habits: The patient does not participate in regular exercise at present, Exercise limited by: None identified  Diet Patient reports consuming 3 meals a day and 0 snack(s) a day Patient reports that his primary diet is: Regular Patient reports that she does have regular access to food.   Depression  Screen PHQ 2/9 Scores 02/24/2020 01/20/2020 05/12/2019 02/22/2019 12/08/2018 05/11/2018 11/21/2017  PHQ - 2 Score 0 0 0 0 0 0 0     Fall Risk Fall Risk  02/24/2020 01/20/2020 05/12/2019 02/22/2019 12/08/2018  Falls in the past year? 1 0 0 0 0  Number falls in past yr: 1 - 0 0 -  Injury with Fall? 1 - 0 0 -  Risk for fall due to : History of fall(s) - No Fall Risks - -  Follow up Falls evaluation completed Falls evaluation completed - Falls prevention discussed -  Comment - - - Get rid of all throw rugs in the house, adequate lighting in the walkways and grab bars in the bathroom -     Objective:  Brett Blankenship seemed alert and oriented and he participated appropriately during our telephone visit.  Blood Pressure Weight BMI  BP Readings from Last 3 Encounters:  01/20/20 132/68  12/31/19 (!) 134/57  12/22/19 (!) 170/72   Wt Readings from Last 3 Encounters:  01/20/20 200 lb (90.7 kg)  12/31/19 199 lb (90.3 kg)  12/22/19 190 lb (86.2 kg)   BMI Readings from Last 1 Encounters:  01/20/20 29.97 kg/m    *Unable to obtain current vital signs, weight, and BMI due to telephone visit type  Hearing/Vision  Braulio Conte did not seem to have difficulty with hearing/understanding during the telephone conversation Reports  that he has had a formal eye exam by an eye care professional within the past year Reports that he has not had a formal hearing evaluation within the past year *Unable to fully assess hearing and vision during telephone visit type  Cognitive Function: 6CIT Screen 02/24/2020 02/22/2019  What Year? 0 points 0 points  What month? 0 points 0 points  What time? 0 points 0 points  Count back from 20 0 points 0 points  Months in reverse 0 points 0 points  Repeat phrase 10 points 2 points  Total Score 10 2   (Normal:0-7, Significant for Dysfunction: >8)  Normal Cognitive Function Screening: No: 10 (repeat phrase)   Immunization & Health Maintenance Record Immunization History  Administered Date(s) Administered   Influenza,inj,Quad PF,6+ Mos 01/29/2013   Pneumococcal Conjugate-13 03/29/2013   Pneumococcal Polysaccharide-23 01/20/2020   Tdap 11/18/2017, 12/22/2019    Health Maintenance  Topic Date Due   COVID-19 Vaccine (1) 03/11/2020 (Originally 09/09/1938)   INFLUENZA VACCINE  07/27/2020 (Originally 11/28/2019)   TETANUS/TDAP  12/21/2029   PNA vac Low Risk Adult  Completed       Assessment  This is a routine wellness examination for Brett Blankenship.  Health Maintenance: Due or Overdue There are no preventive care reminders to display for this patient.  Brett Blankenship does not need a referral for Community Assistance: Care Management:   no Social Work:    no Prescription Assistance:  no Nutrition/Diabetes Education:  no   Plan:  Personalized Goals Goals Addressed             This Visit's Progress    Patient Stated       02/24/2020 AWV Goal: Keep All Scheduled Appointments  Over the next year, patient will attend all scheduled appointments with their PCP and any specialists that they see.        Personalized Health Maintenance & Screening Recommendations    Lung Cancer Screening Recommended: no (Low Dose CT Chest recommended if Age 98-80 years, 30  pack-year currently smoking OR have quit w/in past 15 years) Hepatitis C  Screening recommended: no HIV Screening recommended: no  Advanced Directives: Written information was not prepared per patient's request.  Referrals & Orders No orders of the defined types were placed in this encounter.   Follow-up Plan Follow-up with Claretta Fraise, MD as planned    I have personally reviewed and noted the following in the patient's chart:   Medical and social history Use of alcohol, tobacco or illicit drugs  Current medications and supplements Functional ability and status Nutritional status Physical activity Advanced directives List of other physicians Hospitalizations, surgeries, and ER visits in previous 12 months Vitals Screenings to include cognitive, depression, and falls Referrals and appointments  In addition, I have reviewed and discussed with Brett Blankenship certain preventive protocols, quality metrics, and best practice recommendations. A written personalized care plan for preventive services as well as general preventive health recommendations is available and can be mailed to the patient at his request.      Christia Reading, LPN  [id number]    I have reviewed and agree with the above AWV documentation.   Evelina Dun, FNP

## 2020-04-03 ENCOUNTER — Encounter: Payer: Self-pay | Admitting: Family Medicine

## 2020-04-03 ENCOUNTER — Ambulatory Visit (INDEPENDENT_AMBULATORY_CARE_PROVIDER_SITE_OTHER): Payer: Medicare Other | Admitting: Family Medicine

## 2020-04-03 VITALS — BP 160/67 | HR 74 | Temp 98.0°F

## 2020-04-03 DIAGNOSIS — R059 Cough, unspecified: Secondary | ICD-10-CM

## 2020-04-03 MED ORDER — BENZONATATE 100 MG PO CAPS: 100.0000 mg | ORAL_CAPSULE | Freq: Three times a day (TID) | ORAL | 0 refills | Status: DC | PRN

## 2020-04-03 NOTE — Patient Instructions (Signed)
You will be contacted with your COVID results.  Tessalon perles have been sent for your cough.  If anything gets worse, call me.    It appears that you have a viral upper respiratory infection (cold).  Cold symptoms can last up to 2 weeks.  I recommend that you only use cold medications that are safe in high blood pressure like Coricidin (generic is fine).  Other cold medications can increase your blood pressure.    - Get plenty of rest and drink plenty of fluids. - Try to breathe moist air. Use a cold mist humidifier. - Consume warm fluids (soup or tea) to provide relief for a stuffy nose and to loosen phlegm. - For nasal stuffiness, try saline nasal spray or a Neti Pot.  Afrin nasal spray can also be used but this product should not be used longer than 3 days or it will cause rebound nasal stuffiness (worsening nasal congestion). - For sore throat pain relief: use chloraseptic spray, suck on throat lozenges, hard candy or popsicles; gargle with warm salt water (1/4 tsp. salt per 8 oz. of water); and eat soft, bland foods. - Eat a well-balanced diet. If you cannot, ensure you are getting enough nutrients by taking a daily multivitamin. - Avoid dairy products, as they can thicken phlegm. - Avoid alcohol, as it impairs your body's immune system.  CONTACT YOUR DOCTOR IF YOU EXPERIENCE ANY OF THE FOLLOWING: - High fever - Ear pain - Sinus-type headache - Unusually severe cold symptoms - Cough that gets worse while other cold symptoms improve - Flare up of any chronic lung problem, such as asthma - Your symptoms persist longer than 2 weeks

## 2020-04-03 NOTE — Progress Notes (Signed)
Subjective: CC: URI PCP: Claretta Fraise, MD IRS:[id number] Brett Blankenship is a 86 y.o. male presenting to clinic today for:  1.  Cough Patient reports onset of cough on Wednesday.  Describes this is a dry cough.  No hemoptysis.  No chest congestion.  Has had mild rhinorrhea and mild throat irritation.  No observed fevers.  Denies any shortness of breath or wheezing.  No known sick contacts.   ROS: Per HPI  No Known Allergies Past Medical History:  Diagnosis Date  . HYPERTENSION 07/12/2009    Current Outpatient Medications:  .  amLODipine (NORVASC) 10 MG tablet, Take 1 tablet (10 mg total) by mouth daily. (Needs to be seen before next refill), Disp: 90 tablet, Rfl: 1 .  fenofibrate 160 MG tablet, Take 1 tablet (160 mg total) by mouth daily. (Needs to be seen before next refill), Disp: 90 tablet, Rfl: 1 .  fish oil-omega-3 fatty acids 1000 MG capsule, Take 2 g by mouth daily.  , Disp: , Rfl:  .  Multiple Vitamins-Minerals (EYE VITAMINS & MINERALS PO), Take by mouth. [facility], Disp: , Rfl:  .  nabumetone (RELAFEN) 500 MG tablet, TAKE 2 TABLETS BY MOUTH TWICE DAILY AS NEEDED FOR PAIN., Disp: 360 tablet, Rfl: 1 .  prednisoLONE acetate (PRED FORTE) 1 % ophthalmic suspension, PLACE 1 DROP INTO THE LEFT EYE 4 TIMES DAILY., Disp: 10 mL, Rfl: 0 .  triamterene-hydrochlorothiazide (MAXZIDE-25) 37.5-25 MG tablet, TAKE ONE TABLET BY MOUTH ONCE DAILY FOR BLOOD PRESSURE AND FLUID. (Needs to be seen before next refill), Disp: 90 tablet, Rfl: 1 .  Vitamin D, Ergocalciferol, (DRISDOL) 1.25 MG (50000 UNIT) CAPS capsule, Take 1 capsule (50,000 Units total) by mouth every 7 (seven) days., Disp: 13 capsule, Rfl: 1 Social History   Socioeconomic History  . Marital status: Widowed    Spouse name: Not on file  . Number of children: 2  . Years of education: 7  . Highest education level: 7th grade  Occupational History  . Occupation: retired  Tobacco Use  . Smoking status: Former Smoker    Packs/day: 0.30     Years: 5.00    Pack years: 1.50    Types: Cigarettes    Quit date: 09/12/1963    Years since quitting: 56.5  . Smokeless tobacco: Never Used  Vaping Use  . Vaping Use: Never used  Substance and Sexual Activity  . Alcohol use: Not Currently  . Drug use: Not Currently  . Sexual activity: Not Currently  Other Topics Concern  . Not on file  Social History Narrative  . Not on file   Social Determinants of Health   Financial Resource Strain:   . Difficulty of Paying Living Expenses: Not on file  Food Insecurity:   . Worried About Charity fundraiser in the Last Year: Not on file  . Ran Out of Food in the Last Year: Not on file  Transportation Needs:   . Lack of Transportation (Medical): Not on file  . Lack of Transportation (Non-Medical): Not on file  Physical Activity:   . Days of Exercise per Week: Not on file  . Minutes of Exercise per Session: Not on file  Stress:   . Feeling of Stress : Not on file  Social Connections:   . Frequency of Communication with Friends and Family: Not on file  . Frequency of Social Gatherings with Friends and Family: Not on file  . Attends Religious Services: Not on file  . Active Member of Clubs or  Organizations: Not on file  . Attends Archivist Meetings: Not on file  . Marital Status: Not on file  Intimate Partner Violence:   . Fear of Current or Ex-Partner: Not on file  . Emotionally Abused: Not on file  . Physically Abused: Not on file  . Sexually Abused: Not on file   Family History  Problem Relation Age of Onset  . Hypertension Father   . Heart disease Father   . Cancer Brother        colon, lung    Objective: Office vital signs reviewed. BP (!) 160/67   Pulse 74   Temp 98 Brett (36.7 C)   SpO2 97%   Physical Examination:  General: Awake, alert, nontoxic, No acute distress HEENT: Normal; sclera white.  Moist mucous membranes.  Mild oropharyngeal erythema.  No tonsillar exudates or enlargement.  Airway is patent.   Scant clear rhinorrhea noted.  TMs intact bilaterally with no evidence of secondary bacterial infection Cardio: regular rate and rhythm, S1S2 heard, no murmurs appreciated Pulm: clear to auscultation bilaterally, no wheezes, rhonchi or rales; normal work of breathing on room air   Assessment/ Plan: 86 y.o. male   Cough - Plan: Novel Coronavirus, NAA ([facility]), benzonatate (TESSALON PERLES) 100 MG capsule  Suspect viral mediated.  Tessalon Perles were prescribed.  No evidence of bacterial infection at this point but we did discuss signs and symptoms that would be concerning for bacterial infection would warrant antibiotics.  COVID-19 test ordered and will contact him with this result.  Supportive care recommended.  Follow-up as needed  Orders Placed This Encounter  Procedures  . Novel Coronavirus, NAA ([facility])    Order Specific Question:   Is this test for diagnosis or screening    Answer:   Diagnosis of ill patient    Order Specific Question:   Symptomatic for COVID-19 as defined by CDC    Answer:   Yes    Order Specific Question:   Date of Symptom Onset    Answer:   03/31/2020    Order Specific Question:   Hospitalized for COVID-19    Answer:   No    Order Specific Question:   Admitted to ICU for COVID-19    Answer:   No    Order Specific Question:   Previously tested for COVID-19    Answer:   No    Order Specific Question:   Resident in a congregate (group) care setting    Answer:   No    Order Specific Question:   Is the patient student?    Answer:   No    Order Specific Question:   Employed in healthcare setting    Answer:   No    Order Specific Question:   Has patient completed COVID vaccination(s) (2 doses of Pfizer/Moderna 1 dose of [employer])    Answer:   No    Order Specific Question:   Release to patient    Answer:   Immediate   No orders of the defined types were placed in this encounter.    Janora Norlander, DO [place] [phone]

## 2020-04-05 LAB — SARS-COV-2, NAA 2 DAY TAT

## 2020-04-05 LAB — NOVEL CORONAVIRUS, NAA: SARS-CoV-2, NAA: NOT DETECTED

## 2020-06-08 ENCOUNTER — Encounter (INDEPENDENT_AMBULATORY_CARE_PROVIDER_SITE_OTHER): Payer: Medicare Other | Admitting: Ophthalmology

## 2020-06-20 DIAGNOSIS — C44212 Basal cell carcinoma of skin of right ear and external auricular canal: Secondary | ICD-10-CM | POA: Diagnosis not present

## 2020-06-20 DIAGNOSIS — D485 Neoplasm of uncertain behavior of skin: Secondary | ICD-10-CM | POA: Diagnosis not present

## 2020-06-20 DIAGNOSIS — Z85828 Personal history of other malignant neoplasm of skin: Secondary | ICD-10-CM | POA: Diagnosis not present

## 2020-06-20 DIAGNOSIS — L821 Other seborrheic keratosis: Secondary | ICD-10-CM | POA: Diagnosis not present

## 2020-06-20 DIAGNOSIS — L570 Actinic keratosis: Secondary | ICD-10-CM | POA: Diagnosis not present

## 2020-06-22 ENCOUNTER — Encounter (INDEPENDENT_AMBULATORY_CARE_PROVIDER_SITE_OTHER): Payer: Medicare Other | Admitting: Ophthalmology

## 2020-06-22 ENCOUNTER — Other Ambulatory Visit: Payer: Self-pay

## 2020-06-22 DIAGNOSIS — H59032 Cystoid macular edema following cataract surgery, left eye: Secondary | ICD-10-CM

## 2020-06-22 DIAGNOSIS — I10 Essential (primary) hypertension: Secondary | ICD-10-CM | POA: Diagnosis not present

## 2020-06-22 DIAGNOSIS — H353122 Nonexudative age-related macular degeneration, left eye, intermediate dry stage: Secondary | ICD-10-CM | POA: Diagnosis not present

## 2020-06-22 DIAGNOSIS — H43813 Vitreous degeneration, bilateral: Secondary | ICD-10-CM

## 2020-06-22 DIAGNOSIS — H35033 Hypertensive retinopathy, bilateral: Secondary | ICD-10-CM

## 2020-06-22 DIAGNOSIS — H33302 Unspecified retinal break, left eye: Secondary | ICD-10-CM

## 2020-06-22 DIAGNOSIS — H353111 Nonexudative age-related macular degeneration, right eye, early dry stage: Secondary | ICD-10-CM

## 2020-07-06 DIAGNOSIS — C44212 Basal cell carcinoma of skin of right ear and external auricular canal: Secondary | ICD-10-CM | POA: Diagnosis not present

## 2020-07-19 ENCOUNTER — Ambulatory Visit (INDEPENDENT_AMBULATORY_CARE_PROVIDER_SITE_OTHER): Payer: Medicare Other | Admitting: Family Medicine

## 2020-07-19 ENCOUNTER — Encounter: Payer: Self-pay | Admitting: Family Medicine

## 2020-07-19 VITALS — BP 128/58 | HR 66 | Temp 97.1°F | Ht 68.5 in | Wt 197.8 lb

## 2020-07-19 DIAGNOSIS — E782 Mixed hyperlipidemia: Secondary | ICD-10-CM | POA: Diagnosis not present

## 2020-07-19 DIAGNOSIS — J40 Bronchitis, not specified as acute or chronic: Secondary | ICD-10-CM | POA: Diagnosis not present

## 2020-07-19 DIAGNOSIS — I10 Essential (primary) hypertension: Secondary | ICD-10-CM | POA: Diagnosis not present

## 2020-07-19 DIAGNOSIS — J329 Chronic sinusitis, unspecified: Secondary | ICD-10-CM

## 2020-07-19 MED ORDER — LEVOFLOXACIN 500 MG PO TABS: 500.0000 mg | ORAL_TABLET | Freq: Every day | ORAL | 0 refills | Status: DC

## 2020-07-19 NOTE — Progress Notes (Signed)
Subjective:  Patient ID: Brett Blankenship, male    DOB: 05-30-1926  Age: 87 y.o. MRN: [id number]  CC: Medical Management of Chronic Issues   HPI Brett Blankenship presents for  follow-up of hypertension. Patient has no history of headache chest pain or shortness of breath or recent cough. Patient also denies symptoms of TIA such as focal numbness or weakness. Patient denies side effects from medication. States taking it regularly.  Patient in for follow-up of elevated cholesterol. Doing well without complaints on current medication. Denies side effects of statin including myalgia and arthralgia and nausea. Also in today for liver function testing. Currently no chest pain, shortness of breath or other cardiovascular related symptoms noted.  Patient presents with upper respiratory congestion. Rhinorrhea that is frequently purulent. There is moderate sore throat. Patient reports coughing frequently as well.  Yellow & green sputum noted. There is no fever, chills or sweats. The patient denies being short of breath. Onset was 3-5 days ago. Gradually worsening. Tried OTCs without improvement.  History Brett Blankenship has a past medical history of HYPERTENSION (07/12/2009).   He has a past surgical history that includes Appendectomy (1960).   His family history includes Cancer in his brother; Heart disease in his father; Hypertension in his father.He reports that he quit smoking about 56 years ago. His smoking use included cigarettes. He has a 1.50 pack-year smoking history. He has never used smokeless tobacco. He reports previous alcohol use. He reports previous drug use.  Current Outpatient Medications on File Prior to Visit  Medication Sig Dispense Refill  . amLODipine (NORVASC) 10 MG tablet Take 1 tablet (10 mg total) by mouth daily. (Needs to be seen before next refill) 90 tablet 1  . benzonatate (TESSALON PERLES) 100 MG capsule Take 1 capsule (100 mg total) by mouth 3 (three) times daily as needed.  20 capsule 0  . fenofibrate 160 MG tablet Take 1 tablet (160 mg total) by mouth daily. (Needs to be seen before next refill) 90 tablet 1  . fish oil-omega-3 fatty acids 1000 MG capsule Take 2 g by mouth daily.    . Multiple Vitamins-Minerals (EYE VITAMINS & MINERALS PO) Take by mouth. [facility]    . nabumetone (RELAFEN) 500 MG tablet TAKE 2 TABLETS BY MOUTH TWICE DAILY AS NEEDED FOR PAIN. 360 tablet 1  . prednisoLONE acetate (PRED FORTE) 1 % ophthalmic suspension PLACE 1 DROP INTO THE LEFT EYE 4 TIMES DAILY. 10 mL 0  . triamterene-hydrochlorothiazide (MAXZIDE-25) 37.5-25 MG tablet TAKE ONE TABLET BY MOUTH ONCE DAILY FOR BLOOD PRESSURE AND FLUID. (Needs to be seen before next refill) 90 tablet 1  . Vitamin D, Ergocalciferol, (DRISDOL) 1.25 MG (50000 UNIT) CAPS capsule Take 1 capsule (50,000 Units total) by mouth every 7 (seven) days. 13 capsule 1   No current facility-administered medications on file prior to visit.    ROS Review of Systems  Constitutional: Negative for activity change, appetite change, chills and fever.  HENT: Positive for congestion, postnasal drip, rhinorrhea and sinus pressure. Negative for ear discharge, ear pain, hearing loss, nosebleeds, sneezing and trouble swallowing.   Respiratory: Negative for chest tightness and shortness of breath.   Cardiovascular: Negative for chest pain and palpitations.  Musculoskeletal: Negative for arthralgias.  Skin: Negative for rash.  Psychiatric/Behavioral: Positive for dysphoric mood.    Objective:  BP (!) 128/58   Pulse 66   Temp (!) 97.1 F (36.2 C)   Ht 5' 8.5" (1.74 m)   Wt 197 lb 12.8  oz (89.7 kg)   SpO2 97%   BMI 29.64 kg/m   BP Readings from Last 3 Encounters:  07/19/20 (!) 128/58  04/03/20 (!) 160/67  01/20/20 132/68    Wt Readings from Last 3 Encounters:  07/19/20 197 lb 12.8 oz (89.7 kg)  01/20/20 200 lb (90.7 kg)  12/31/19 199 lb (90.3 kg)     Physical Exam Vitals reviewed.  Constitutional:       Appearance: He is well-developed.  HENT:     Head: Normocephalic and atraumatic.     Right Ear: Tympanic membrane and external ear normal. No decreased hearing noted.     Left Ear: Tympanic membrane and external ear normal. No decreased hearing noted.     Mouth/Throat:     Pharynx: No oropharyngeal exudate or posterior oropharyngeal erythema.  Eyes:     Pupils: Pupils are equal, round, and reactive to light.  Cardiovascular:     Rate and Rhythm: Normal rate and regular rhythm.     Heart sounds: No murmur heard.   Pulmonary:     Effort: No respiratory distress.     Breath sounds: Normal breath sounds.  Abdominal:     General: Bowel sounds are normal.     Palpations: Abdomen is soft. There is no mass.     Tenderness: There is no abdominal tenderness.  Musculoskeletal:     Cervical back: Normal range of motion and neck supple.       Assessment & Plan:   Brett Blankenship was seen today for medical management of chronic issues.  Diagnoses and all orders for this visit:  Essential hypertension -     CBC with Differential/Platelet -     CMP14+EGFR  Sinobronchitis -     CBC with Differential/Platelet -     CMP14+EGFR  Mixed hyperlipidemia -     CBC with Differential/Platelet -     CMP14+EGFR -     Lipid panel  Other orders -     levofloxacin (LEVAQUIN) 500 MG tablet; Take 1 tablet (500 mg total) by mouth daily.   Allergies as of 07/19/2020   No Known Allergies     Medication List       Accurate as of July 19, 2020 11:19 AM. If you have any questions, ask your nurse or doctor.        amLODipine 10 MG tablet Commonly known as: NORVASC Take 1 tablet (10 mg total) by mouth daily. (Needs to be seen before next refill)   benzonatate 100 MG capsule Commonly known as: Tessalon Perles Take 1 capsule (100 mg total) by mouth 3 (three) times daily as needed.   EYE VITAMINS & MINERALS PO Take by mouth. [facility]   fenofibrate 160 MG tablet Take 1 tablet (160 mg total) by  mouth daily. (Needs to be seen before next refill)   fish oil-omega-3 fatty acids 1000 MG capsule Take 2 g by mouth daily.   levofloxacin 500 MG tablet Commonly known as: LEVAQUIN Take 1 tablet (500 mg total) by mouth daily. Started by: Claretta Fraise, MD   nabumetone 500 MG tablet Commonly known as: RELAFEN TAKE 2 TABLETS BY MOUTH TWICE DAILY AS NEEDED FOR PAIN.   prednisoLONE acetate 1 % ophthalmic suspension Commonly known as: PRED FORTE PLACE 1 DROP INTO THE LEFT EYE 4 TIMES DAILY.   triamterene-hydrochlorothiazide 37.5-25 MG tablet Commonly known as: MAXZIDE-25 TAKE ONE TABLET BY MOUTH ONCE DAILY FOR BLOOD PRESSURE AND FLUID. (Needs to be seen before next refill)   Vitamin D (  Ergocalciferol) 1.25 MG (50000 UNIT) Caps capsule Commonly known as: DRISDOL Take 1 capsule (50,000 Units total) by mouth every 7 (seven) days.       Meds ordered this encounter  Medications  . levofloxacin (LEVAQUIN) 500 MG tablet    Sig: Take 1 tablet (500 mg total) by mouth daily.    Dispense:  7 tablet    Refill:  0      Follow-up: Return in about 6 months (around 01/19/2021), or if symptoms worsen or fail to improve.  Claretta Fraise, M.D.

## 2020-07-20 DIAGNOSIS — H52223 Regular astigmatism, bilateral: Secondary | ICD-10-CM | POA: Diagnosis not present

## 2020-07-20 LAB — CMP14+EGFR
ALT: 13 IU/L (ref 0–44)
AST: 22 IU/L (ref 0–40)
Albumin/Globulin Ratio: 1.3 (ref 1.2–2.2)
Albumin: 3.8 g/dL (ref 3.5–4.6)
Alkaline Phosphatase: 34 IU/L — ABNORMAL LOW (ref 44–121)
BUN/Creatinine Ratio: 17 (ref 10–24)
BUN: 22 mg/dL (ref 10–36)
Bilirubin Total: 0.3 mg/dL (ref 0.0–1.2)
CO2: 23 mmol/L (ref 20–29)
Calcium: 9.7 mg/dL (ref 8.6–10.2)
Chloride: 102 mmol/L (ref 96–106)
Creatinine, Ser: 1.32 mg/dL — ABNORMAL HIGH (ref 0.76–1.27)
Globulin, Total: 3 g/dL (ref 1.5–4.5)
Glucose: 106 mg/dL — ABNORMAL HIGH (ref 65–99)
Potassium: 3.7 mmol/L (ref 3.5–5.2)
Sodium: 142 mmol/L (ref 134–144)
Total Protein: 6.8 g/dL (ref 6.0–8.5)
eGFR: 51 mL/min/{1.73_m2} — ABNORMAL LOW (ref >59)

## 2020-07-20 LAB — CBC WITH DIFFERENTIAL/PLATELET
Basophils Absolute: 0 10*3/uL (ref 0.0–0.2)
Basos: 0 %
EOS (ABSOLUTE): 0.2 10*3/uL (ref 0.0–0.4)
Eos: 3 %
Hematocrit: 33.7 % — ABNORMAL LOW (ref 37.5–51.0)
Hemoglobin: 11.7 g/dL — ABNORMAL LOW (ref 13.0–17.7)
Immature Grans (Abs): 0 10*3/uL (ref 0.0–0.1)
Immature Granulocytes: 0 %
Lymphocytes Absolute: 1.9 10*3/uL (ref 0.7–3.1)
Lymphs: 27 %
MCH: 35.2 pg — ABNORMAL HIGH (ref 26.6–33.0)
MCHC: 34.7 g/dL (ref 31.5–35.7)
MCV: 102 fL — ABNORMAL HIGH (ref 79–97)
Monocytes Absolute: 0.4 10*3/uL (ref 0.1–0.9)
Monocytes: 6 %
Neutrophils Absolute: 4.6 10*3/uL (ref 1.4–7.0)
Neutrophils: 64 %
Platelets: 135 10*3/uL — ABNORMAL LOW (ref 150–450)
RBC: 3.32 x10E6/uL — ABNORMAL LOW (ref 4.14–5.80)
RDW: 13 % (ref 11.6–15.4)
WBC: 7.2 10*3/uL (ref 3.4–10.8)

## 2020-07-20 LAB — LIPID PANEL
Chol/HDL Ratio: 5 ratio (ref 0.0–5.0)
Cholesterol, Total: 116 mg/dL (ref 100–199)
HDL: 23 mg/dL — ABNORMAL LOW (ref >39)
LDL Chol Calc (NIH): 61 mg/dL (ref 0–99)
Triglycerides: 189 mg/dL — ABNORMAL HIGH (ref 0–149)
VLDL Cholesterol Cal: 32 mg/dL (ref 5–40)

## 2020-07-24 ENCOUNTER — Telehealth: Payer: Self-pay

## 2020-07-24 ENCOUNTER — Other Ambulatory Visit: Payer: Self-pay | Admitting: Family Medicine

## 2020-07-24 DIAGNOSIS — I10 Essential (primary) hypertension: Secondary | ICD-10-CM

## 2020-07-24 DIAGNOSIS — M25562 Pain in left knee: Secondary | ICD-10-CM

## 2020-07-24 DIAGNOSIS — G8929 Other chronic pain: Secondary | ICD-10-CM

## 2020-07-24 NOTE — Telephone Encounter (Signed)
Please review patients lab results and call patient with results.

## 2020-07-31 ENCOUNTER — Encounter: Payer: Self-pay | Admitting: *Deleted

## 2020-08-07 ENCOUNTER — Other Ambulatory Visit: Payer: Self-pay | Admitting: Family Medicine

## 2020-08-07 DIAGNOSIS — E782 Mixed hyperlipidemia: Secondary | ICD-10-CM

## 2020-08-07 DIAGNOSIS — I10 Essential (primary) hypertension: Secondary | ICD-10-CM

## 2020-08-23 ENCOUNTER — Encounter: Payer: Self-pay | Admitting: *Deleted

## 2020-10-24 ENCOUNTER — Other Ambulatory Visit: Payer: Self-pay | Admitting: Family Medicine

## 2020-10-24 DIAGNOSIS — I10 Essential (primary) hypertension: Secondary | ICD-10-CM

## 2020-11-01 DIAGNOSIS — L570 Actinic keratosis: Secondary | ICD-10-CM | POA: Diagnosis not present

## 2020-11-02 ENCOUNTER — Other Ambulatory Visit: Payer: Self-pay | Admitting: Family Medicine

## 2020-11-02 DIAGNOSIS — M25562 Pain in left knee: Secondary | ICD-10-CM

## 2020-11-02 DIAGNOSIS — G8929 Other chronic pain: Secondary | ICD-10-CM

## 2020-11-06 ENCOUNTER — Other Ambulatory Visit: Payer: Self-pay | Admitting: Family Medicine

## 2020-11-06 DIAGNOSIS — I10 Essential (primary) hypertension: Secondary | ICD-10-CM

## 2020-11-06 DIAGNOSIS — E782 Mixed hyperlipidemia: Secondary | ICD-10-CM

## 2020-11-08 DIAGNOSIS — J3489 Other specified disorders of nose and nasal sinuses: Secondary | ICD-10-CM | POA: Diagnosis not present

## 2020-11-08 DIAGNOSIS — R0602 Shortness of breath: Secondary | ICD-10-CM | POA: Diagnosis not present

## 2020-11-08 DIAGNOSIS — R059 Cough, unspecified: Secondary | ICD-10-CM | POA: Diagnosis not present

## 2020-11-08 DIAGNOSIS — R5383 Other fatigue: Secondary | ICD-10-CM | POA: Diagnosis not present

## 2020-12-20 ENCOUNTER — Encounter (INDEPENDENT_AMBULATORY_CARE_PROVIDER_SITE_OTHER): Payer: Medicare Other | Admitting: Ophthalmology

## 2020-12-20 ENCOUNTER — Other Ambulatory Visit: Payer: Self-pay

## 2020-12-20 DIAGNOSIS — H33302 Unspecified retinal break, left eye: Secondary | ICD-10-CM

## 2020-12-20 DIAGNOSIS — I10 Essential (primary) hypertension: Secondary | ICD-10-CM

## 2020-12-20 DIAGNOSIS — H59032 Cystoid macular edema following cataract surgery, left eye: Secondary | ICD-10-CM

## 2020-12-20 DIAGNOSIS — H35033 Hypertensive retinopathy, bilateral: Secondary | ICD-10-CM

## 2020-12-20 DIAGNOSIS — H353132 Nonexudative age-related macular degeneration, bilateral, intermediate dry stage: Secondary | ICD-10-CM | POA: Diagnosis not present

## 2020-12-20 DIAGNOSIS — H43813 Vitreous degeneration, bilateral: Secondary | ICD-10-CM

## 2021-01-23 ENCOUNTER — Ambulatory Visit: Payer: Medicare Other | Admitting: Family Medicine

## 2021-01-25 ENCOUNTER — Ambulatory Visit: Payer: Medicare Other | Admitting: Family Medicine

## 2021-01-25 DIAGNOSIS — E559 Vitamin D deficiency, unspecified: Secondary | ICD-10-CM

## 2021-01-25 DIAGNOSIS — I10 Essential (primary) hypertension: Secondary | ICD-10-CM

## 2021-01-25 DIAGNOSIS — E782 Mixed hyperlipidemia: Secondary | ICD-10-CM

## 2021-01-26 ENCOUNTER — Telehealth: Payer: Self-pay | Admitting: Family Medicine

## 2021-01-26 NOTE — Telephone Encounter (Signed)
Called pt's son Octavia Bruckner) because the pt needs to r/s his 6 month appt but wanted the son to set it up for him. Please make him an appt if he calls back.

## 2021-01-29 ENCOUNTER — Encounter: Payer: Self-pay | Admitting: Family Medicine

## 2021-02-06 ENCOUNTER — Other Ambulatory Visit: Payer: Self-pay | Admitting: Family Medicine

## 2021-02-06 DIAGNOSIS — E782 Mixed hyperlipidemia: Secondary | ICD-10-CM

## 2021-02-06 DIAGNOSIS — I10 Essential (primary) hypertension: Secondary | ICD-10-CM

## 2021-02-19 ENCOUNTER — Telehealth: Payer: Self-pay | Admitting: Family Medicine

## 2021-02-19 NOTE — Telephone Encounter (Signed)
LVM for pt to rtn my call to schedule AWV with [facility].  

## 2021-02-28 ENCOUNTER — Ambulatory Visit (INDEPENDENT_AMBULATORY_CARE_PROVIDER_SITE_OTHER): Payer: Medicare Other

## 2021-02-28 VITALS — Ht 69.0 in | Wt 182.0 lb

## 2021-02-28 DIAGNOSIS — Z0000 Encounter for general adult medical examination without abnormal findings: Secondary | ICD-10-CM

## 2021-02-28 NOTE — Patient Instructions (Signed)
[name] , Thank you for taking time to come for your Medicare Wellness Visit. I appreciate your ongoing commitment to your health goals. Please review the following plan we discussed and let me know if I can assist you in the future.   Screening recommendations/referrals: Colonoscopy: No longer required Recommended yearly ophthalmology/optometry visit for glaucoma screening and checkup Recommended yearly dental visit for hygiene and checkup  Vaccinations: Influenza vaccine: Declined Pneumococcal vaccine: done 03/29/2013 & 01/20/2020 Tdap vaccine: Done 12/22/2019 - Repeat in 10 years  Shingles vaccine: Due   Covid-19: Declined  Advanced directives: Please bring a copy of your health care power of attorney and living will to the office to be added to your chart at your convenience.   Conditions/risks identified: Aim for 30 minutes of exercise or brisk walking each day, drink 6-8 glasses of water and eat lots of fruits and vegetables.   Next appointment: Follow up in one year for your annual wellness visit.   Preventive Care 45 Years and Older, Male  Preventive care refers to lifestyle choices and visits with your health care provider that can promote health and wellness. What does preventive care include? A yearly physical exam. This is also called an annual well check. Dental exams once or twice a year. Routine eye exams. Ask your health care provider how often you should have your eyes checked. Personal lifestyle choices, including: Daily care of your teeth and gums. Regular physical activity. Eating a healthy diet. Avoiding tobacco and drug use. Limiting alcohol use. Practicing safe sex. Taking low doses of aspirin every day. Taking vitamin and mineral supplements as recommended by your health care provider. What happens during an annual well check? The services and screenings done by your health care provider during your annual well check will depend on your age, overall  health, lifestyle risk factors, and family history of disease. Counseling  Your health care provider may ask you questions about your: Alcohol use. Tobacco use. Drug use. Emotional well-being. Home and relationship well-being. Sexual activity. Eating habits. History of falls. Memory and ability to understand (cognition). Work and work Statistician. Screening  You may have the following tests or measurements: Height, weight, and BMI. Blood pressure. Lipid and cholesterol levels. These may be checked every 5 years, or more frequently if you are over 73 years old. Skin check. Lung cancer screening. You may have this screening every year starting at age 44 if you have a 30-pack-year history of smoking and currently smoke or have quit within the past 15 years. Fecal occult blood test (FOBT) of the stool. You may have this test every year starting at age 68. Flexible sigmoidoscopy or colonoscopy. You may have a sigmoidoscopy every 5 years or a colonoscopy every 10 years starting at age 73. Prostate cancer screening. Recommendations will vary depending on your family history and other risks. Hepatitis C blood test. Hepatitis B blood test. Sexually transmitted disease (STD) testing. Diabetes screening. This is done by checking your blood sugar (glucose) after you have not eaten for a while (fasting). You may have this done every 1-3 years. Abdominal aortic aneurysm (AAA) screening. You may need this if you are a current or former smoker. Osteoporosis. You may be screened starting at age 73 if you are at high risk. Talk with your health care provider about your test results, treatment options, and if necessary, the need for more tests. Vaccines  Your health care provider may recommend certain vaccines, such as: Influenza vaccine. This is recommended every  year. Tetanus, diphtheria, and acellular pertussis (Tdap, Td) vaccine. You may need a Td booster every 10 years. Zoster vaccine. You may  need this after age 57. Pneumococcal 13-valent conjugate (PCV13) vaccine. One dose is recommended after age 59. Pneumococcal polysaccharide (PPSV23) vaccine. One dose is recommended after age 41. Talk to your health care provider about which screenings and vaccines you need and how often you need them. This information is not intended to replace advice given to you by your health care provider. Make sure you discuss any questions you have with your health care provider. Document Released: 05/12/2015 Document Revised: 01/03/2016 Document Reviewed: 02/14/2015 Elsevier Interactive Patient Education  2017 [place] Prevention in the Home Falls can cause injuries. They can happen to people of all ages. There are many things you can do to make your home safe and to help prevent falls. What can I do on the outside of my home? Regularly fix the edges of walkways and driveways and fix any cracks. Remove anything that might make you trip as you walk through a door, such as a raised step or threshold. Trim any bushes or trees on the path to your home. Use bright outdoor lighting. Clear any walking paths of anything that might make someone trip, such as rocks or tools. Regularly check to see if handrails are loose or broken. Make sure that both sides of any steps have handrails. Any raised decks and porches should have guardrails on the edges. Have any leaves, snow, or ice cleared regularly. Use sand or salt on walking paths during winter. Clean up any spills in your garage right away. This includes oil or grease spills. What can I do in the bathroom? Use night lights. Install grab bars by the toilet and in the tub and shower. Do not use towel bars as grab bars. Use non-skid mats or decals in the tub or shower. If you need to sit down in the shower, use a plastic, non-slip stool. Keep the floor dry. Clean up any water that spills on the floor as soon as it happens. Remove soap buildup in  the tub or shower regularly. Attach bath mats securely with double-sided non-slip rug tape. Do not have throw rugs and other things on the floor that can make you trip. What can I do in the bedroom? Use night lights. Make sure that you have a light by your bed that is easy to reach. Do not use any sheets or blankets that are too big for your bed. They should not hang down onto the floor. Have a firm chair that has side arms. You can use this for support while you get dressed. Do not have throw rugs and other things on the floor that can make you trip. What can I do in the kitchen? Clean up any spills right away. Avoid walking on wet floors. Keep items that you use a lot in easy-to-reach places. If you need to reach something above you, use a strong step stool that has a grab bar. Keep electrical cords out of the way. Do not use floor polish or wax that makes floors slippery. If you must use wax, use non-skid floor wax. Do not have throw rugs and other things on the floor that can make you trip. What can I do with my stairs? Do not leave any items on the stairs. Make sure that there are handrails on both sides of the stairs and use them. Fix handrails that are broken or  loose. Make sure that handrails are as long as the stairways. Check any carpeting to make sure that it is firmly attached to the stairs. Fix any carpet that is loose or worn. Avoid having throw rugs at the top or bottom of the stairs. If you do have throw rugs, attach them to the floor with carpet tape. Make sure that you have a light switch at the top of the stairs and the bottom of the stairs. If you do not have them, ask someone to add them for you. What else can I do to help prevent falls? Wear shoes that: Do not have high heels. Have rubber bottoms. Are comfortable and fit you well. Are closed at the toe. Do not wear sandals. If you use a stepladder: Make sure that it is fully opened. Do not climb a closed  stepladder. Make sure that both sides of the stepladder are locked into place. Ask someone to hold it for you, if possible. Clearly mark and make sure that you can see: Any grab bars or handrails. First and last steps. Where the edge of each step is. Use tools that help you move around (mobility aids) if they are needed. These include: Canes. Walkers. Scooters. Crutches. Turn on the lights when you go into a dark area. Replace any light bulbs as soon as they burn out. Set up your furniture so you have a clear path. Avoid moving your furniture around. If any of your floors are uneven, fix them. If there are any pets around you, be aware of where they are. Review your medicines with your doctor. Some medicines can make you feel dizzy. This can increase your chance of falling. Ask your doctor what other things that you can do to help prevent falls. This information is not intended to replace advice given to you by your health care provider. Make sure you discuss any questions you have with your health care provider. Document Released: 02/09/2009 Document Revised: 09/21/2015 Document Reviewed: 05/20/2014 Elsevier Interactive Patient Education  2017 [employer].

## 2021-02-28 NOTE — Progress Notes (Signed)
Subjective:   Brett Blankenship is a 87 y.o. male who presents for Medicare Annual/Subsequent preventive examination.  Virtual Visit via Telephone Note  I connected with  MITCHELLE GOERNER on 02/28/21 at  3:30 PM EDT by telephone and verified that I am speaking with the correct person using two identifiers.  Location: Patient: Home Provider: [facility] Persons participating in the virtual visit: patient/Nurse Health Advisor   I discussed the limitations, risks, security and privacy concerns of performing an evaluation and management service by telephone and the availability of in person appointments. The patient expressed understanding and agreed to proceed.  Interactive audio and video telecommunications were attempted between this nurse and patient, however failed, due to patient having technical difficulties OR patient did not have access to video capability.  We continued and completed visit with audio only.  Some vital signs may be absent or patient reported.   Biannca Scantlin E Jahred Tatar, LPN   Review of Systems     Cardiac Risk Factors include: advanced age (>32men, >39 women);male gender;sedentary lifestyle;dyslipidemia;hypertension     Objective:    Today's Vitals   02/28/21 1541  Weight: 182 lb (82.6 kg)  Height: 5\' 9"  (1.753 m)   Body mass index is 26.88 kg/m.  Advanced Directives 02/28/2021 02/24/2020 02/22/2019 12/14/2018 12/04/2018 11/18/2017 07/16/2016  Does Patient Have a Medical Advance Directive? No Yes Yes Yes Yes No Yes  Type of Advance Directive - [place];Living will Healthcare Power of [place];Living will - -  Does patient want to make changes to medical advance directive? - - No - Patient declined - - - -  Copy of [place] in Chart? - No - copy requested No - copy requested - - - -  Would patient like information on creating a medical advance directive? No - Patient declined No - Patient declined - - -  Yes (MAU/Ambulatory/Procedural Areas - Information given) -    Current Medications (verified) Outpatient Encounter Medications as of 02/28/2021  Medication Sig   amLODipine (NORVASC) 10 MG tablet TAKE (1) TABLET BY MOUTH ONCE DAILY.   fenofibrate 160 MG tablet TAKE 1 TABLET BY MOUTH ONCE A DAY.   fish oil-omega-3 fatty acids 1000 MG capsule Take 2 g by mouth daily.   Multiple Vitamins-Minerals (EYE VITAMINS & MINERALS PO) Take by mouth. [facility]   nabumetone (RELAFEN) 500 MG tablet TAKE 2 TABLETS BY MOUTH TWICE DAILY AS NEEDED FOR PAIN.   prednisoLONE acetate (PRED FORTE) 1 % ophthalmic suspension PLACE 1 DROP INTO THE LEFT EYE 4 TIMES DAILY.   triamterene-hydrochlorothiazide (MAXZIDE-25) 37.5-25 MG tablet TAKE ONE TABLET BY MOUTH ONCE DAILY FOR BLOOD PRESSURE AND FLUID.   Vitamin D, Ergocalciferol, (DRISDOL) 1.25 MG (50000 UNIT) CAPS capsule Take 1 capsule (50,000 Units total) by mouth every 7 (seven) days.   benzonatate (TESSALON PERLES) 100 MG capsule Take 1 capsule (100 mg total) by mouth 3 (three) times daily as needed. (Patient not taking: Reported on 02/28/2021)   levofloxacin (LEVAQUIN) 500 MG tablet Take 1 tablet (500 mg total) by mouth daily. (Patient not taking: Reported on 02/28/2021)   No facility-administered encounter medications on file as of 02/28/2021.    Allergies (verified) Patient has no known allergies.   History: Past Medical History:  Diagnosis Date   HYPERTENSION 07/12/2009   Past Surgical History:  Procedure Laterality Date   APPENDECTOMY  1960   Family History  Problem Relation Age of Onset   Hypertension Father  Heart disease Father    Cancer Brother        colon, lung   Social History   Socioeconomic History   Marital status: Widowed    Spouse name: Not on file   Number of children: 2   Years of education: 7   Highest education level: 7th grade  Occupational History   Occupation: retired  Tobacco Use   Smoking status: Former    Packs/day:  0.30    Years: 5.00    Pack years: 1.50    Types: Cigarettes    Quit date: 09/12/1963    Years since quitting: 57.5   Smokeless tobacco: Never  Vaping Use   Vaping Use: Never used  Substance and Sexual Activity   Alcohol use: Not Currently   Drug use: Not Currently   Sexual activity: Not Currently  Other Topics Concern   Not on file  Social History Narrative   Lives alone - daughter in law takes him shopping once a week and out to eat   Grandchildren and son stop by often   Social Determinants of Health   Financial Resource Strain: Low Risk    Difficulty of Paying Living Expenses: Not hard at all  Food Insecurity: No Food Insecurity   Worried About Charity fundraiser in the Last Year: Never true   Arboriculturist in the Last Year: Never true  Transportation Needs: No Transportation Needs   Lack of Transportation (Medical): No   Lack of Transportation (Non-Medical): No  Physical Activity: Insufficiently Active   Days of Exercise per Week: 7 days   Minutes of Exercise per Session: 10 min  Stress: No Stress Concern Present   Feeling of Stress : Not at all  Social Connections: Moderately Integrated   Frequency of Communication with Friends and Family: More than three times a week   Frequency of Social Gatherings with Friends and Family: More than three times a week   Attends Religious Services: More than 4 times per year   Active Member of [employer] or Organizations: Yes   Attends Archivist Meetings: More than 4 times per year   Marital Status: Widowed    Tobacco Counseling Counseling given: Not Answered   Clinical Intake:  Pre-visit preparation completed: Yes  Pain : No/denies pain     BMI - recorded: 26.88 Nutritional Status: BMI 25 -29 Overweight Nutritional Risks: None Diabetes: No  How often do you need to have someone help you when you read instructions, pamphlets, or other written materials from your doctor or pharmacy?: 1 - Never  Diabetic?  no  Interpreter Needed?: No  Information entered by :: Destine Ambroise, LPN   Activities of Daily Living In your present state of health, do you have any difficulty performing the following activities: 02/28/2021  Hearing? N  Vision? Y  Comment a little  Difficulty concentrating or making decisions? N  Walking or climbing stairs? N  Dressing or bathing? N  Doing errands, shopping? Y  Comment quit driving in 1700  Preparing Food and eating ? N  Using the Toilet? N  In the past six months, have you accidently leaked urine? N  Do you have problems with loss of bowel control? N  Managing your Medications? N  Managing your Finances? N  Housekeeping or managing your Housekeeping? N  Some recent data might be hidden    Patient Care Team: Claretta Fraise, MD as PCP - General (Family Medicine)  Indicate any recent Medical Services you  may have received from other than [facility] providers in the past year (date may be approximate).     Assessment:   This is a routine wellness examination for [place].  Hearing/Vision screen Hearing Screening - Comments:: Denies hearing difficulties  Vision Screening - Comments:: Wears rx glasses - up to date with annual eye exams with [facility]  Dietary issues and exercise activities discussed: Current Exercise Habits: Home exercise routine, Type of exercise: walking, Time (Minutes): 10, Frequency (Times/Week): 7, Weekly Exercise (Minutes/Week): 70, Intensity: Mild, Exercise limited by: None identified   Goals Addressed             This Visit's Progress    DIET - INCREASE WATER INTAKE   On track    Increase water intake to 48 ounces per day.      Have 3 meals a day   On track      Depression Screen PHQ 2/9 Scores 02/28/2021 07/19/2020 04/03/2020 02/24/2020 01/20/2020 05/12/2019 02/22/2019  PHQ - 2 Score 0 0 0 0 0 0 0    Fall Risk Fall Risk  02/28/2021 07/19/2020 07/19/2020 04/03/2020 02/24/2020  Falls in the past year? 0 1 0 1 1   Number falls in past yr: 0 0 - 0 1  Injury with Fall? 0 1 - 1 1  Risk for fall due to : No Fall Risks History of fall(s) - History of fall(s) History of fall(s)  Follow up Falls prevention discussed Falls evaluation completed - Falls evaluation completed Falls evaluation completed  Comment - - - - -    FALL RISK PREVENTION PERTAINING TO THE HOME:  Any stairs in or around the home? No  If so, are there any without handrails? No  Home free of loose throw rugs in walkways, pet beds, electrical cords, etc? Yes  Adequate lighting in your home to reduce risk of falls? Yes   ASSISTIVE DEVICES UTILIZED TO PREVENT FALLS:  Life alert? No  Use of a cane, walker or w/c? No  Grab bars in the bathroom? Yes  Shower chair or bench in shower? Yes  Elevated toilet seat or a handicapped toilet? Yes   TIMED UP AND GO:  Was the test performed? No . Telephonic visit  Cognitive Function: MMSE - Mini Mental State Exam 11/19/2017  Orientation to time 5  Orientation to Place 4  Registration 3  Attention/ Calculation 4  Recall 1  Language- name 2 objects 2  Language- repeat 1  Language- follow 3 step command 3  Language- read & follow direction 1  Write a sentence 1  Copy design 1  Total score 26     6CIT Screen 02/28/2021 02/24/2020 02/22/2019  What Year? 0 points 0 points 0 points  What month? 0 points 0 points 0 points  What time? 0 points 0 points 0 points  Count back from 20 0 points 0 points 0 points  Months in reverse 0 points 0 points 0 points  Repeat phrase 6 points 10 points 2 points  Total Score 6 10 2     Immunizations Immunization History  Administered Date(s) Administered   Fluad Quad(high Dose 65+) 04/03/2020   Influenza,inj,Quad PF,6+ Mos 01/29/2013   Pneumococcal Conjugate-13 03/29/2013   Pneumococcal Polysaccharide-23 01/20/2020   Tdap 11/18/2017, 12/22/2019    TDAP status: Up to date  Flu Vaccine status: Declined, Education has been provided regarding the  importance of this vaccine but patient still declined. Advised may receive this vaccine at local pharmacy or Health Dept. Aware to  provide a copy of the vaccination record if obtained from local pharmacy or Health Dept. Verbalized acceptance and understanding.  Pneumococcal vaccine status: Up to date  Covid-19 vaccine status: Declined, Education has been provided regarding the importance of this vaccine but patient still declined. Advised may receive this vaccine at local pharmacy or Health Dept.or vaccine clinic. Aware to provide a copy of the vaccination record if obtained from local pharmacy or Health Dept. Verbalized acceptance and understanding.  Qualifies for Shingles Vaccine? Yes   Zostavax completed No   Shingrix Completed?: No.    Education has been provided regarding the importance of this vaccine. Patient has been advised to call insurance company to determine out of pocket expense if they have not yet received this vaccine. Advised may also receive vaccine at local pharmacy or Health Dept. Verbalized acceptance and understanding.  Screening Tests Health Maintenance  Topic Date Due   COVID-19 Vaccine (1) Never done   Zoster Vaccines- Shingrix (1 of 2) Never done   INFLUENZA VACCINE  11/27/2020   TETANUS/TDAP  12/21/2029   Pneumonia Vaccine 38+ Years old  Completed   HPV VACCINES  Aged Out    Health Maintenance  Health Maintenance Due  Topic Date Due   COVID-19 Vaccine (1) Never done   Zoster Vaccines- Shingrix (1 of 2) Never done   INFLUENZA VACCINE  11/27/2020    Colorectal cancer screening: No longer required.   Lung Cancer Screening: (Low Dose CT Chest recommended if Age 61-80 years, 30 pack-year currently smoking OR have quit w/in 15years.) does not qualify  Additional Screening:  Hepatitis C Screening: does not qualify  Vision Screening: Recommended annual ophthalmology exams for early detection of glaucoma and other disorders of the eye. Is the patient up to  date with their annual eye exam?  Yes  Who is the provider or what is the name of the office in which the patient attends annual eye exams? [facility] If pt is not established with a provider, would they like to be referred to a provider to establish care? No .   Dental Screening: Recommended annual dental exams for proper oral hygiene  Community Resource Referral / Chronic Care Management: CRR required this visit?  No   CCM required this visit?  No      Plan:     I have personally reviewed and noted the following in the patient's chart:   Medical and social history Use of alcohol, tobacco or illicit drugs  Current medications and supplements including opioid prescriptions. Patient is not currently taking opioid prescriptions. Functional ability and status Nutritional status Physical activity Advanced directives List of other physicians Hospitalizations, surgeries, and ER visits in previous 12 months Vitals Screenings to include cognitive, depression, and falls Referrals and appointments  In addition, I have reviewed and discussed with patient certain preventive protocols, quality metrics, and best practice recommendations. A written personalized care plan for preventive services as well as general preventive health recommendations were provided to patient.     Sandrea Hammond, LPN   [id number]/12/6281   Nurse Notes: None

## 2021-03-07 ENCOUNTER — Ambulatory Visit (INDEPENDENT_AMBULATORY_CARE_PROVIDER_SITE_OTHER): Payer: Medicare Other | Admitting: Family Medicine

## 2021-03-07 ENCOUNTER — Other Ambulatory Visit: Payer: Self-pay

## 2021-03-07 ENCOUNTER — Encounter: Payer: Self-pay | Admitting: Family Medicine

## 2021-03-07 VITALS — BP 135/71 | HR 91 | Temp 96.7°F | Ht 69.0 in | Wt 187.2 lb

## 2021-03-07 DIAGNOSIS — I10 Essential (primary) hypertension: Secondary | ICD-10-CM

## 2021-03-07 DIAGNOSIS — E559 Vitamin D deficiency, unspecified: Secondary | ICD-10-CM | POA: Diagnosis not present

## 2021-03-07 DIAGNOSIS — E782 Mixed hyperlipidemia: Secondary | ICD-10-CM | POA: Diagnosis not present

## 2021-03-07 DIAGNOSIS — Z125 Encounter for screening for malignant neoplasm of prostate: Secondary | ICD-10-CM | POA: Diagnosis not present

## 2021-03-07 MED ORDER — TRIAMTERENE-HCTZ 37.5-25 MG PO TABS: ORAL_TABLET | ORAL | 2 refills | Status: DC

## 2021-03-07 MED ORDER — FENOFIBRATE 160 MG PO TABS: 160.0000 mg | ORAL_TABLET | Freq: Every day | ORAL | 2 refills | Status: DC

## 2021-03-07 MED ORDER — NITROGLYCERIN 0.3 MG SL SUBL: 0.3000 mg | SUBLINGUAL_TABLET | SUBLINGUAL | 12 refills | Status: DC | PRN

## 2021-03-07 MED ORDER — VITAMIN D (ERGOCALCIFEROL) 1.25 MG (50000 UNIT) PO CAPS: 50000.0000 [IU] | ORAL_CAPSULE | ORAL | 3 refills | Status: DC

## 2021-03-07 MED ORDER — AMLODIPINE BESYLATE 10 MG PO TABS: ORAL_TABLET | ORAL | 3 refills | Status: DC

## 2021-03-07 NOTE — Progress Notes (Signed)
Subjective:  Patient ID: Brett Blankenship, male    DOB: April 22, 1927  Age: 87 y.o. MRN: [id number]  CC: Medical Management of Chronic Issues   HPI [name] presents for  follow-up of hypertension. Patient has no history of headache  or shortness of breath or recent cough. Patient also denies symptoms of TIA such as focal numbness or weakness. Patient denies side effects from medication. States taking it regularly.Had some chest pain 3 days ago while sitting in church. Lasted about 10 min. Seems to happen with exertion 1-2 times a month. Relieved by rest. No clear history of radiation or nausea or sweating with it.    History Ander has a past medical history of HYPERTENSION (07/12/2009).   He has a past surgical history that includes Appendectomy (1960).   His family history includes Cancer in his brother; Heart disease in his father; Hypertension in his father.He reports that he quit smoking about 57 years ago. His smoking use included cigarettes. He has a 1.50 pack-year smoking history. He has never used smokeless tobacco. He reports that he does not currently use alcohol. He reports that he does not currently use drugs.  Current Outpatient Medications on File Prior to Visit  Medication Sig Dispense Refill   fish oil-omega-3 fatty acids 1000 MG capsule Take 2 g by mouth daily.     Multiple Vitamins-Minerals (EYE VITAMINS & MINERALS PO) Take by mouth. [facility]     nabumetone (RELAFEN) 500 MG tablet TAKE 2 TABLETS BY MOUTH TWICE DAILY AS NEEDED FOR PAIN. 360 tablet 0   prednisoLONE acetate (PRED FORTE) 1 % ophthalmic suspension PLACE 1 DROP INTO THE LEFT EYE 4 TIMES DAILY. 10 mL 0   No current facility-administered medications on file prior to visit.    ROS Review of Systems  Constitutional:  Negative for fever.  Respiratory:  Negative for shortness of breath.   Cardiovascular:  Negative for chest pain.  Musculoskeletal:  Negative for arthralgias.  Skin:  Negative for  rash.   Objective:  BP 135/71   Pulse 91   Temp (!) 96.7 F (35.9 C)   Ht _0  (1.753 m)   Wt 187 lb 3.2 oz (84.9 kg)   SpO2 98%   BMI 27.64 kg/m   BP Readings from Last 3 Encounters:  03/07/21 135/71  07/19/20 (!) 128/58  04/03/20 (!) 160/67    Wt Readings from Last 3 Encounters:  03/07/21 187 lb 3.2 oz (84.9 kg)  02/28/21 182 lb (82.6 kg)  07/19/20 197 lb 12.8 oz (89.7 kg)     Physical Exam Constitutional:      General: He is not in acute distress.    Appearance: He is well-developed.  HENT:     Head: Normocephalic and atraumatic.     Right Ear: External ear normal.     Left Ear: External ear normal.     Nose: Nose normal.  Eyes:     Conjunctiva/sclera: Conjunctivae normal.     Pupils: Pupils are equal, round, and reactive to light.  Cardiovascular:     Rate and Rhythm: Normal rate and regular rhythm.     Heart sounds: Normal heart sounds. No murmur heard. Pulmonary:     Effort: Pulmonary effort is normal. No respiratory distress.     Breath sounds: Normal breath sounds. No wheezing or rales.  Abdominal:     Palpations: Abdomen is soft.     Tenderness: There is no abdominal tenderness.  Musculoskeletal:  General: Normal range of motion.     Cervical back: Normal range of motion and neck supple.  Skin:    General: Skin is warm and dry.  Neurological:     Mental Status: He is alert and oriented to person, place, and time.     Deep Tendon Reflexes: Reflexes are normal and symmetric.  Psychiatric:        Behavior: Behavior normal.        Thought Content: Thought content normal.        Judgment: Judgment normal.      Assessment & Plan:   Lashon was seen today for medical management of chronic issues.  Diagnoses and all orders for this visit:  Essential hypertension -     CBC with Differential/Platelet -     CMP14+EGFR -     amLODipine (NORVASC) 10 MG tablet; TAKE (1) TABLET BY MOUTH ONCE DAILY. -     triamterene-hydrochlorothiazide  (MAXZIDE-25) 37.5-25 MG tablet; TAKE ONE TABLET BY MOUTH ONCE DAILY FOR BLOOD PRESSURE AND FLUID.  Mixed hyperlipidemia -     Lipid panel  Vitamin D deficiency -     VITAMIN D 25 Hydroxy (Vit-D Deficiency, Fractures)  Screening for prostate cancer -     PSA, total and free  Hyperlipidemia, mixed -     fenofibrate 160 MG tablet; Take 1 tablet (160 mg total) by mouth daily.  Other orders -     Vitamin D, Ergocalciferol, (DRISDOL) 1.25 MG (50000 UNIT) CAPS capsule; Take 1 capsule (50,000 Units total) by mouth every 7 (seven) days. -     nitroGLYCERIN (NITROSTAT) 0.3 MG SL tablet; Place 1 tablet (0.3 mg total) under the tongue every 5 (five) minutes as needed for chest pain.  Allergies as of 03/07/2021   No Known Allergies      Medication List        Accurate as of March 07, 2021  9:16 AM. If you have any questions, ask your nurse or doctor.          STOP taking these medications    benzonatate 100 MG capsule Commonly known as: [place] boy Stopped by: Claretta Fraise, MD   levofloxacin 500 MG tablet Commonly known as: LEVAQUIN Stopped by: Claretta Fraise, MD       TAKE these medications    amLODipine 10 MG tablet Commonly known as: NORVASC TAKE (1) TABLET BY MOUTH ONCE DAILY.   EYE VITAMINS & MINERALS PO Take by mouth. [facility]   fenofibrate 160 MG tablet Take 1 tablet (160 mg total) by mouth daily.   fish oil-omega-3 fatty acids 1000 MG capsule Take 2 g by mouth daily.   nabumetone 500 MG tablet Commonly known as: RELAFEN TAKE 2 TABLETS BY MOUTH TWICE DAILY AS NEEDED FOR PAIN.   nitroGLYCERIN 0.3 MG SL tablet Commonly known as: NITROSTAT Place 1 tablet (0.3 mg total) under the tongue every 5 (five) minutes as needed for chest pain. Started by: Claretta Fraise, MD   prednisoLONE acetate 1 % ophthalmic suspension Commonly known as: PRED FORTE PLACE 1 DROP INTO THE LEFT EYE 4 TIMES DAILY.   triamterene-hydrochlorothiazide 37.5-25 MG  tablet Commonly known as: MAXZIDE-25 TAKE ONE TABLET BY MOUTH ONCE DAILY FOR BLOOD PRESSURE AND FLUID.   Vitamin D (Ergocalciferol) 1.25 MG (50000 UNIT) Caps capsule Commonly known as: DRISDOL Take 1 capsule (50,000 Units total) by mouth every 7 (seven) days.        Meds ordered this encounter  Medications   amLODipine (NORVASC) 10  MG tablet    Sig: TAKE (1) TABLET BY MOUTH ONCE DAILY.    Dispense:  90 tablet    Refill:  3   fenofibrate 160 MG tablet    Sig: Take 1 tablet (160 mg total) by mouth daily.    Dispense:  90 tablet    Refill:  2   triamterene-hydrochlorothiazide (MAXZIDE-25) 37.5-25 MG tablet    Sig: TAKE ONE TABLET BY MOUTH ONCE DAILY FOR BLOOD PRESSURE AND FLUID.    Dispense:  90 tablet    Refill:  2   Vitamin D, Ergocalciferol, (DRISDOL) 1.25 MG (50000 UNIT) CAPS capsule    Sig: Take 1 capsule (50,000 Units total) by mouth every 7 (seven) days.    Dispense:  13 capsule    Refill:  3   nitroGLYCERIN (NITROSTAT) 0.3 MG SL tablet    Sig: Place 1 tablet (0.3 mg total) under the tongue every 5 (five) minutes as needed for chest pain.    Dispense:  90 tablet    Refill:  12    Report any increases in the chest pains ASAP. Call EMS if not promptly reieved by NTG  Follow-up: Return in about 6 months (around 09/04/2021) for hypertension.  Claretta Fraise, M.D.

## 2021-03-08 LAB — CMP14+EGFR
ALT: 12 IU/L (ref 0–44)
AST: 23 IU/L (ref 0–40)
Albumin/Globulin Ratio: 1.2 (ref 1.2–2.2)
Albumin: 3.7 g/dL (ref 3.5–4.6)
Alkaline Phosphatase: 39 IU/L — ABNORMAL LOW (ref 44–121)
BUN/Creatinine Ratio: 17 (ref 10–24)
BUN: 24 mg/dL (ref 10–36)
Bilirubin Total: 0.4 mg/dL (ref 0.0–1.2)
CO2: 24 mmol/L (ref 20–29)
Calcium: 9.8 mg/dL (ref 8.6–10.2)
Chloride: 104 mmol/L (ref 96–106)
Creatinine, Ser: 1.42 mg/dL — ABNORMAL HIGH (ref 0.76–1.27)
Globulin, Total: 3.1 g/dL (ref 1.5–4.5)
Glucose: 95 mg/dL (ref 70–99)
Potassium: 3.5 mmol/L (ref 3.5–5.2)
Sodium: 141 mmol/L (ref 134–144)
Total Protein: 6.8 g/dL (ref 6.0–8.5)
eGFR: 46 mL/min/{1.73_m2} — ABNORMAL LOW (ref >59)

## 2021-03-08 LAB — CBC WITH DIFFERENTIAL/PLATELET
Basophils Absolute: 0.1 10*3/uL (ref 0.0–0.2)
Basos: 1 %
EOS (ABSOLUTE): 0.1 10*3/uL (ref 0.0–0.4)
Eos: 2 %
Hematocrit: 34.8 % — ABNORMAL LOW (ref 37.5–51.0)
Hemoglobin: 11.7 g/dL — ABNORMAL LOW (ref 13.0–17.7)
Immature Grans (Abs): 0 10*3/uL (ref 0.0–0.1)
Immature Granulocytes: 0 %
Lymphocytes Absolute: 1.7 10*3/uL (ref 0.7–3.1)
Lymphs: 43 %
MCH: 33.7 pg — ABNORMAL HIGH (ref 26.6–33.0)
MCHC: 33.6 g/dL (ref 31.5–35.7)
MCV: 100 fL — ABNORMAL HIGH (ref 79–97)
Monocytes Absolute: 0.4 10*3/uL (ref 0.1–0.9)
Monocytes: 10 %
Neutrophils Absolute: 1.8 10*3/uL (ref 1.4–7.0)
Neutrophils: 44 %
Platelets: 154 10*3/uL (ref 150–450)
RBC: 3.47 x10E6/uL — ABNORMAL LOW (ref 4.14–5.80)
RDW: 14.1 % (ref 11.6–15.4)
WBC: 4 10*3/uL (ref 3.4–10.8)

## 2021-03-08 LAB — LIPID PANEL
Chol/HDL Ratio: 4.3 ratio (ref 0.0–5.0)
Cholesterol, Total: 115 mg/dL (ref 100–199)
HDL: 27 mg/dL — ABNORMAL LOW (ref >39)
LDL Chol Calc (NIH): 61 mg/dL (ref 0–99)
Triglycerides: 155 mg/dL — ABNORMAL HIGH (ref 0–149)
VLDL Cholesterol Cal: 27 mg/dL (ref 5–40)

## 2021-03-08 LAB — VITAMIN D 25 HYDROXY (VIT D DEFICIENCY, FRACTURES): Vit D, 25-Hydroxy: 24.9 ng/mL — ABNORMAL LOW (ref 30.0–100.0)

## 2021-03-08 LAB — PSA, TOTAL AND FREE
PSA, Free Pct: 37.5 %
PSA, Free: 0.15 ng/mL
Prostate Specific Ag, Serum: 0.4 ng/mL (ref 0.0–4.0)

## 2021-03-09 ENCOUNTER — Other Ambulatory Visit: Payer: Self-pay | Admitting: *Deleted

## 2021-03-09 NOTE — Progress Notes (Signed)
Dear Brett Blankenship, Your Vitamin D is  low. You need a prescription strength supplement I will send that in for you. Nurse, if at all possible, could you send in a prescription for the patient for vitamin D 50,000 units, 1 p.o. weekly #13 with 3 refills? Many thanks, WS

## 2021-04-01 IMAGING — CT CT HEAD W/O CM
3 of 4 series · 16 of 47 positions shown, 19 images · non-contrast
Comparison: None.

CLINICAL DATA: Fell down steps with trauma to the head. Laceration.

EXAM:
CT HEAD WITHOUT CONTRAST
TECHNIQUE: Contiguous axial images were obtained from the base of the skull
through the vertex without intravenous contrast.

[Series 2: head w o · axial · 0.45mm/px · z∈[+53,+193]mm · 10 of 34 slices shown, 13 images]
[im 3/34  brain]
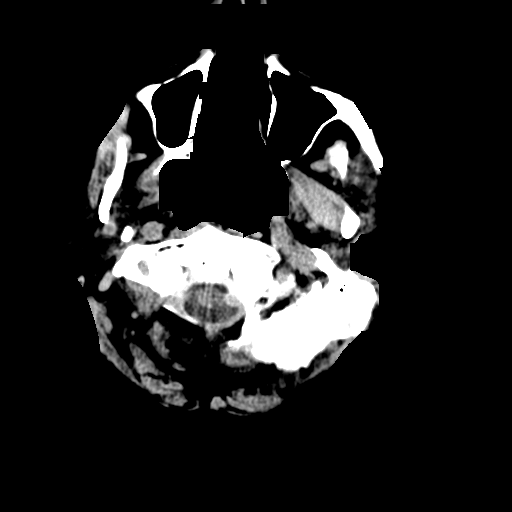
[im 3/34  bone]
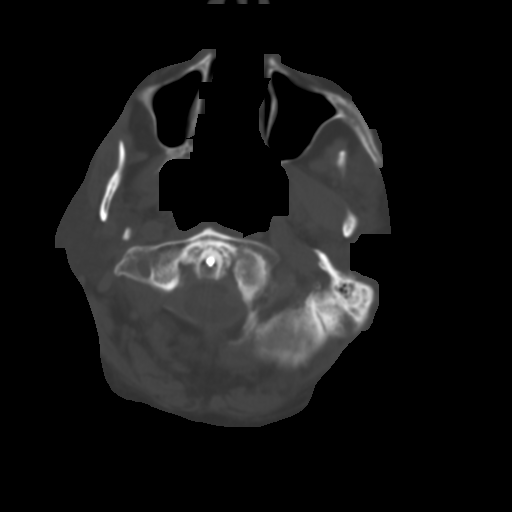
[im 5/34  brain]
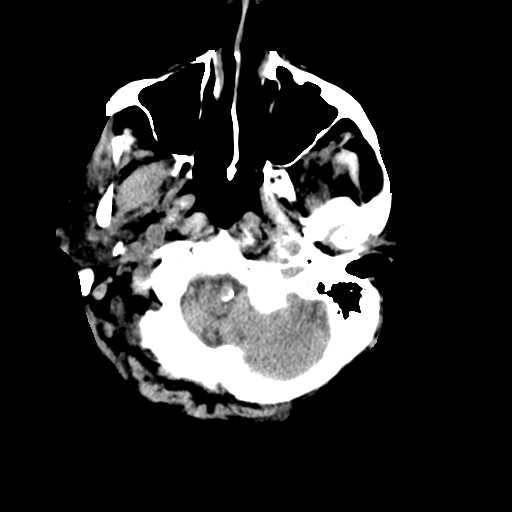
[im 10/34  brain]
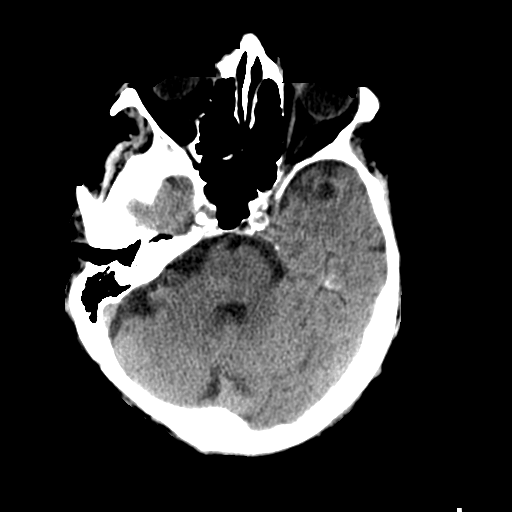
[im 12/34  brain]
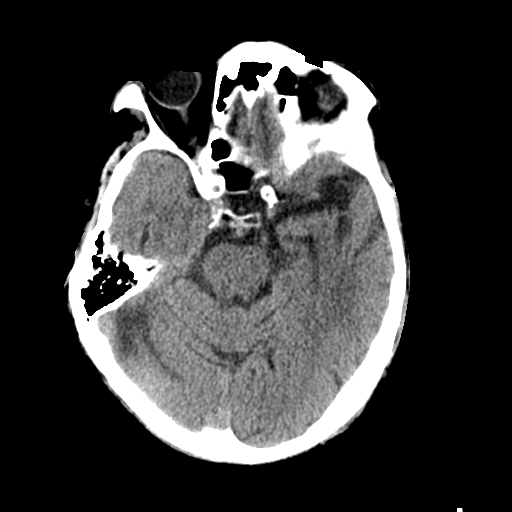
[im 15/34  brain]
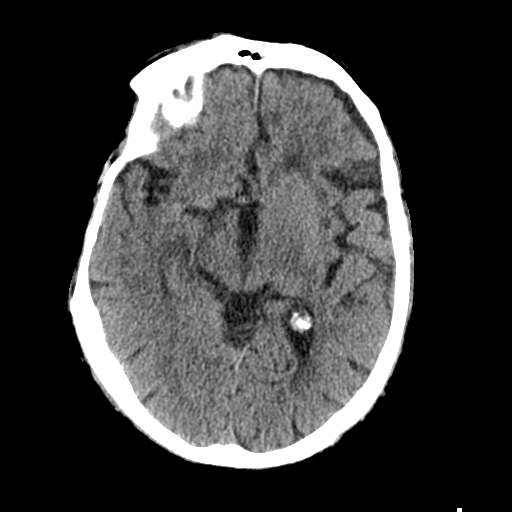
[im 15/34  bone]
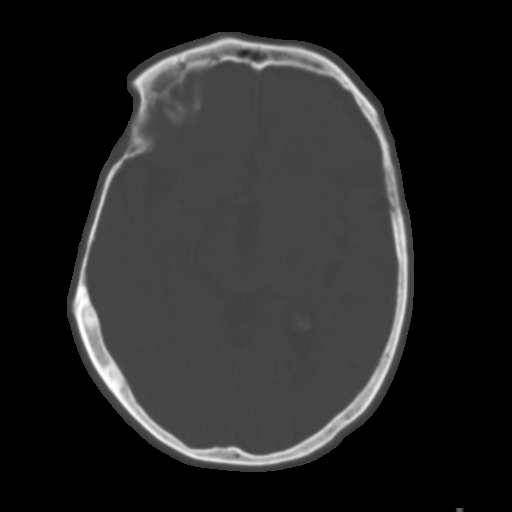
[im 19/34  brain]
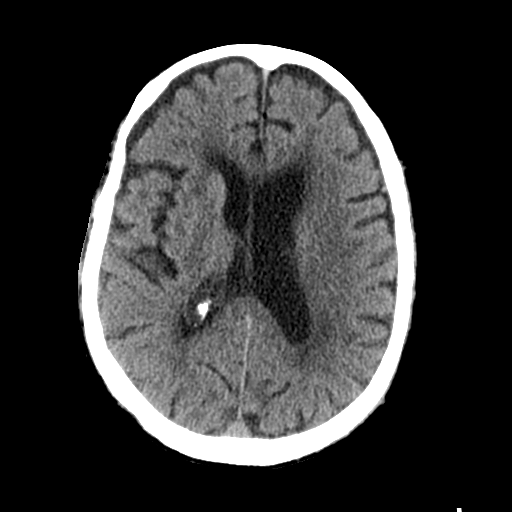
[im 22/34  brain]
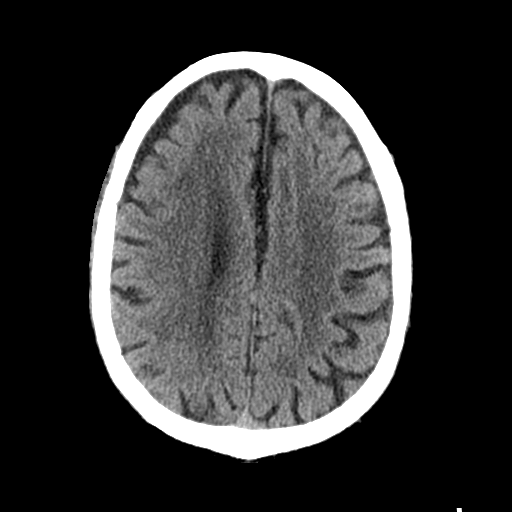
[im 24/34  brain]
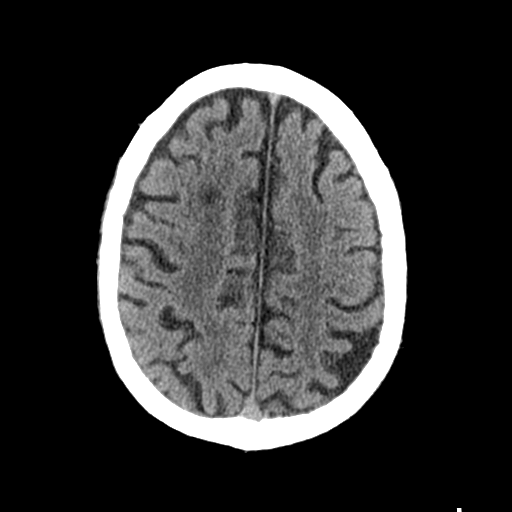
[im 29/34  brain]
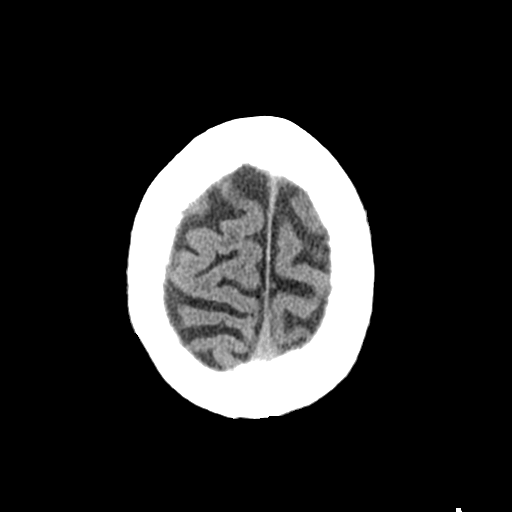
[im 29/34  bone]
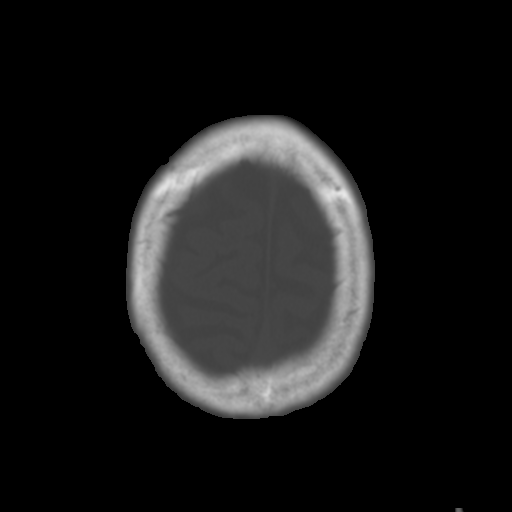
[im 31/34  brain]
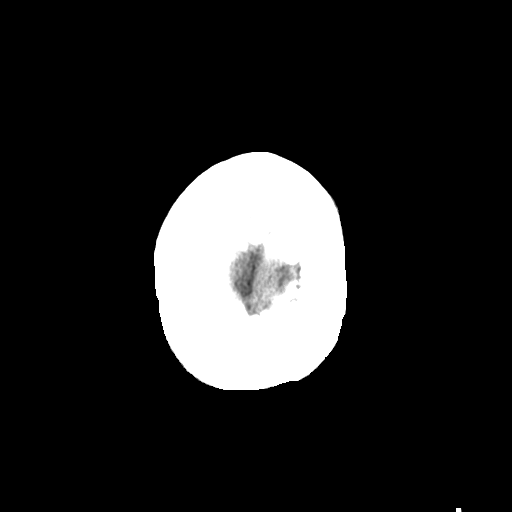

[Series 4: coronal soft · coronal · 0.37mm/px · 3 of 84 slices shown]
[im 28/84  brain]
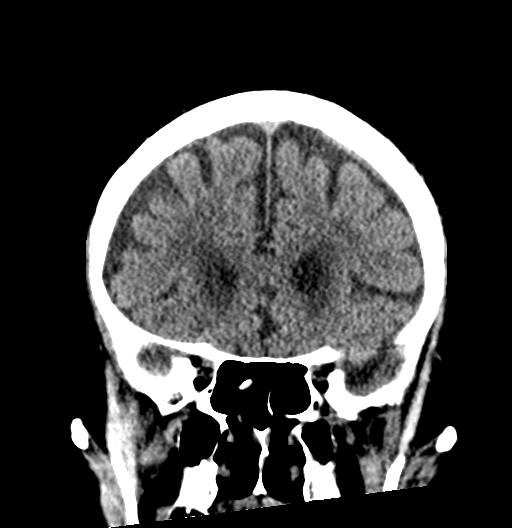
[im 37/84  brain]
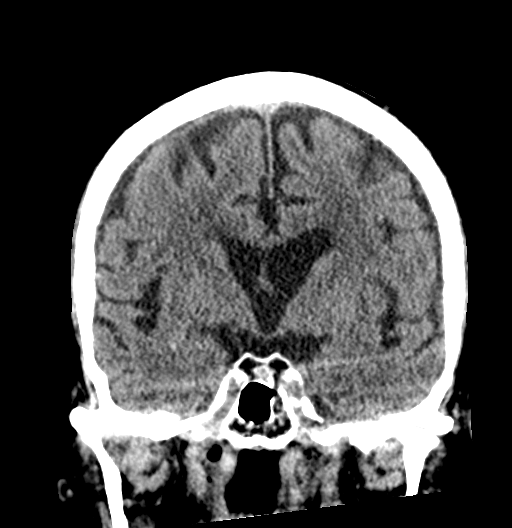
[im 47/84  brain]
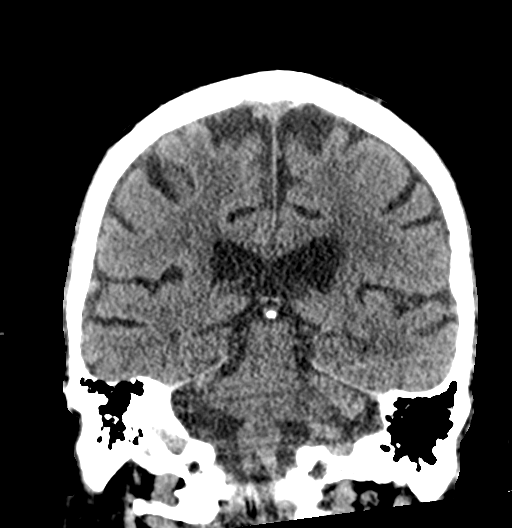

[Series 5: sagittal soft · sagittal · 0.38mm/px · 3 of 59 slices shown]
[im 21/59  brain]
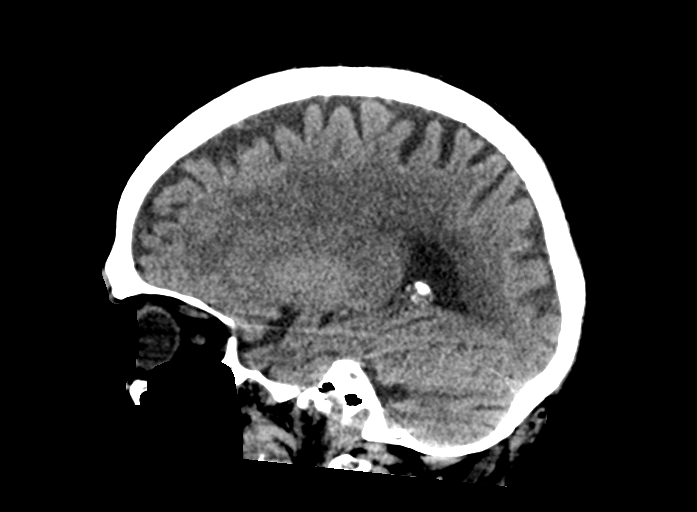
[im 30/59  brain]
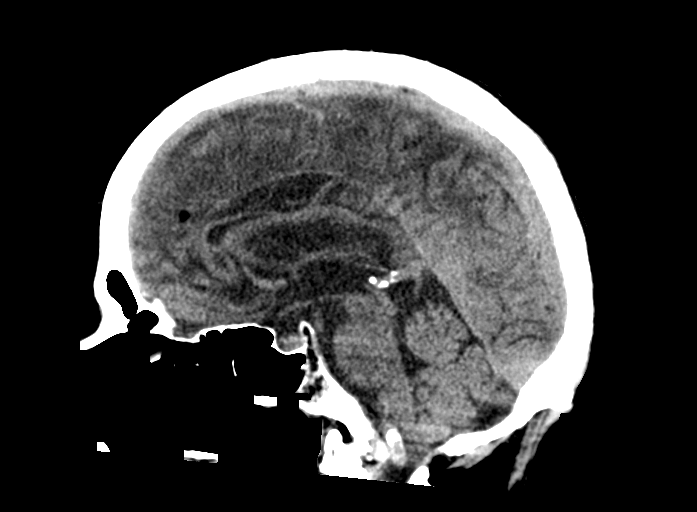
[im 39/59  brain]
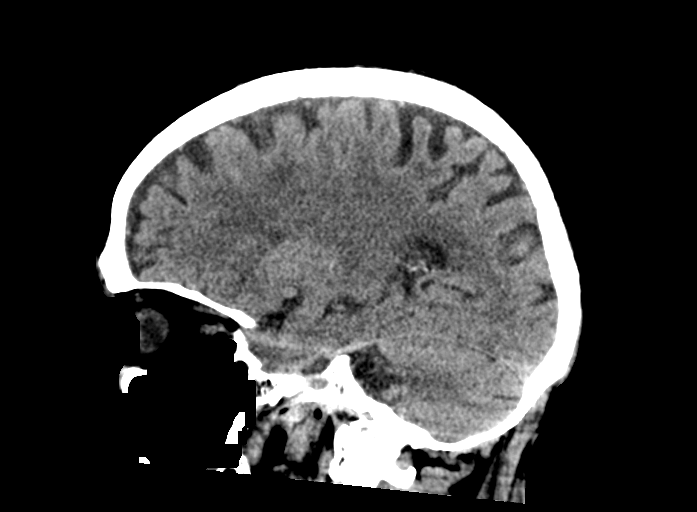

[16 of 47 positions shown; findings below may reference images not displayed]

FINDINGS: Brain: Age related atrophy. Chronic small-vessel ischemic changes of
the hemispheric white matter. No sign of acute infarction, mass
lesion, hemorrhage, hydrocephalus or extra-axial collection.

Vascular: There is atherosclerotic calcification of the major
vessels at the base of the brain.

Skull: No skull fracture

Sinuses/Orbits: Clear/normal

Other: None
IMPRESSION: No acute or traumatic finding. Age related atrophy and chronic
small-vessel ischemic changes of the white matter.

## 2021-05-25 ENCOUNTER — Other Ambulatory Visit: Payer: Self-pay | Admitting: Family Medicine

## 2021-05-25 DIAGNOSIS — M25562 Pain in left knee: Secondary | ICD-10-CM

## 2021-05-25 DIAGNOSIS — G8929 Other chronic pain: Secondary | ICD-10-CM

## 2021-06-22 ENCOUNTER — Encounter (INDEPENDENT_AMBULATORY_CARE_PROVIDER_SITE_OTHER): Payer: Medicare Other | Admitting: Ophthalmology

## 2021-06-22 ENCOUNTER — Other Ambulatory Visit: Payer: Self-pay

## 2021-06-22 DIAGNOSIS — H353132 Nonexudative age-related macular degeneration, bilateral, intermediate dry stage: Secondary | ICD-10-CM

## 2021-06-22 DIAGNOSIS — I10 Essential (primary) hypertension: Secondary | ICD-10-CM | POA: Diagnosis not present

## 2021-06-22 DIAGNOSIS — H59032 Cystoid macular edema following cataract surgery, left eye: Secondary | ICD-10-CM | POA: Diagnosis not present

## 2021-06-22 DIAGNOSIS — H35033 Hypertensive retinopathy, bilateral: Secondary | ICD-10-CM | POA: Diagnosis not present

## 2021-06-22 DIAGNOSIS — H43813 Vitreous degeneration, bilateral: Secondary | ICD-10-CM

## 2021-06-22 DIAGNOSIS — H33302 Unspecified retinal break, left eye: Secondary | ICD-10-CM

## 2021-08-07 ENCOUNTER — Other Ambulatory Visit: Payer: Self-pay | Admitting: Family Medicine

## 2021-08-07 DIAGNOSIS — I10 Essential (primary) hypertension: Secondary | ICD-10-CM

## 2021-09-04 ENCOUNTER — Ambulatory Visit (INDEPENDENT_AMBULATORY_CARE_PROVIDER_SITE_OTHER): Payer: Medicare Other | Admitting: Family Medicine

## 2021-09-04 ENCOUNTER — Encounter: Payer: Self-pay | Admitting: Family Medicine

## 2021-09-04 VITALS — BP 143/67 | HR 81 | Temp 97.1°F | Ht 69.0 in | Wt 188.6 lb

## 2021-09-04 DIAGNOSIS — I10 Essential (primary) hypertension: Secondary | ICD-10-CM

## 2021-09-04 DIAGNOSIS — E559 Vitamin D deficiency, unspecified: Secondary | ICD-10-CM

## 2021-09-04 DIAGNOSIS — E782 Mixed hyperlipidemia: Secondary | ICD-10-CM | POA: Diagnosis not present

## 2021-09-04 DIAGNOSIS — R799 Abnormal finding of blood chemistry, unspecified: Secondary | ICD-10-CM | POA: Diagnosis not present

## 2021-09-04 MED ORDER — TRIAMTERENE-HCTZ 37.5-25 MG PO TABS: ORAL_TABLET | ORAL | 3 refills | Status: DC

## 2021-09-04 NOTE — Progress Notes (Signed)
? ?Subjective:  ?Patient ID: Brett Blankenship, male    DOB: 05/15/1926  Age: 88 y.o. MRN: [id number] ? ?CC: Medical Management of Chronic Issues ? ? ?HPI ?Brett Blankenship presents for  follow-up of hypertension. Patient has no history of headache chest pain or shortness of breath or recent cough. Patient also denies symptoms of TIA such as focal numbness or weakness. Patient denies side effects from medication. States taking it regularly. ? ?Planted his garden last month. Put in 28 Tomatoes, squash, peppers ?No complaints.  Quit driving 2 years ago. Daughter in [place] drives for him. Helps him get groceries. A young neighbor, age 55 will help him around the house.  ? ? ?History ?General has a past medical history of HYPERTENSION (07/12/2009).  ? ?He has a past surgical history that includes Appendectomy (1960).  ? ?His family history includes Cancer in his brother; Heart disease in his father; Hypertension in his father.He reports that he quit smoking about 58 years ago. His smoking use included cigarettes. He has a 1.50 pack-year smoking history. He has never used smokeless tobacco. He reports that he does not currently use alcohol. He reports that he does not currently use drugs. ? ?Current Outpatient Medications on File Prior to Visit  ?Medication Sig Dispense Refill  ? amLODipine (NORVASC) 10 MG tablet TAKE (1) TABLET BY MOUTH ONCE DAILY. 90 tablet 3  ? fenofibrate 160 MG tablet Take 1 tablet (160 mg total) by mouth daily. 90 tablet 2  ? fish oil-omega-3 fatty acids 1000 MG capsule Take 2 g by mouth daily.    ? Multiple Vitamins-Minerals (EYE VITAMINS & MINERALS PO) Take by mouth. [facility]    ? nabumetone (RELAFEN) 500 MG tablet TAKE 2 TABLETS BY MOUTH TWICE DAILY AS NEEDED FOR PAIN. 360 tablet 0  ? nitroGLYCERIN (NITROSTAT) 0.3 MG SL tablet Place 1 tablet (0.3 mg total) under the tongue every 5 (five) minutes as needed for chest pain. 90 tablet 12  ? prednisoLONE acetate (PRED FORTE) 1 % ophthalmic suspension  PLACE 1 DROP INTO THE LEFT EYE 4 TIMES DAILY. 10 mL 0  ? Vitamin D, Ergocalciferol, (DRISDOL) 1.25 MG (50000 UNIT) CAPS capsule Take 1 capsule (50,000 Units total) by mouth every 7 (seven) days. 13 capsule 3  ? ?No current facility-administered medications on file prior to visit.  ? ? ?ROS ?Review of Systems  ?Constitutional:  Negative for fever.  ?Respiratory:  Negative for shortness of breath.   ?Cardiovascular:  Negative for chest pain.  ?Musculoskeletal:  Negative for arthralgias.  ?Skin:  Negative for rash.  ? ?Objective:  ?BP (!) 143/67   Pulse 81   Temp (!) 97.1 ?F (36.2 ?C)   Ht _0  (1.753 m)   Wt 188 lb 9.6 oz (85.5 kg)   SpO2 99%   BMI 27.85 kg/m?  ? ?BP Readings from Last 3 Encounters:  ?09/04/21 (!) 143/67  ?03/07/21 135/71  ?07/19/20 (!) 128/58  ? ? ?Wt Readings from Last 3 Encounters:  ?09/04/21 188 lb 9.6 oz (85.5 kg)  ?03/07/21 187 lb 3.2 oz (84.9 kg)  ?02/28/21 182 lb (82.6 kg)  ? ? ? ?Physical Exam ?Vitals reviewed.  ?Constitutional:   ?   Appearance: He is well-developed.  ?HENT:  ?   Head: Normocephalic and atraumatic.  ?   Right Ear: External ear normal.  ?   Left Ear: External ear normal.  ?   Mouth/Throat:  ?   Pharynx: No oropharyngeal exudate or posterior oropharyngeal erythema.  ?Eyes:  ?  Pupils: Pupils are equal, round, and reactive to light.  ?Cardiovascular:  ?   Rate and Rhythm: Normal rate and regular rhythm.  ?   Heart sounds: No murmur heard. ?Pulmonary:  ?   Effort: No respiratory distress.  ?   Breath sounds: Normal breath sounds.  ?Musculoskeletal:  ?   Cervical back: Normal range of motion and neck supple.  ?Neurological:  ?   Mental Status: He is alert and oriented to person, place, and time.  ? ? ? ? ?Assessment & Plan:  ? ?[name] was seen today for medical management of chronic issues. ? ?Diagnoses and all orders for this visit: ? ?Vitamin D deficiency ?-     CMP14+EGFR ? ?Essential hypertension ?-     triamterene-hydrochlorothiazide (MAXZIDE-25) 37.5-25 MG tablet;  TAKE ONE TABLET BY MOUTH ONCE DAILY FOR BLOOD PRESSURE AND FLUID. ?-     CBC with Differential/Platelet ?-     CMP14+EGFR ?-     Lipid panel ? ?Mixed hyperlipidemia ?-     CMP14+EGFR ?-     Lipid panel ? ? ?Allergies as of 09/04/2021   ?No Known Allergies ?  ? ?  ?Medication List  ?  ? ?  ? Accurate as of Sep 04, 2021  8:54 AM. If you have any questions, ask your nurse or doctor.  ?  ?  ? ?  ? ?amLODipine 10 MG tablet ?Commonly known as: NORVASC ?TAKE (1) TABLET BY MOUTH ONCE DAILY. ?  ?EYE VITAMINS & MINERALS PO ?Take by mouth. [facility] ?  ?fenofibrate 160 MG tablet ?Take 1 tablet (160 mg total) by mouth daily. ?  ?fish oil-omega-3 fatty acids 1000 MG capsule ?Take 2 g by mouth daily. ?  ?nabumetone 500 MG tablet ?Commonly known as: RELAFEN ?TAKE 2 TABLETS BY MOUTH TWICE DAILY AS NEEDED FOR PAIN. ?  ?nitroGLYCERIN 0.3 MG SL tablet ?Commonly known as: NITROSTAT ?Place 1 tablet (0.3 mg total) under the tongue every 5 (five) minutes as needed for chest pain. ?  ?prednisoLONE acetate 1 % ophthalmic suspension ?Commonly known as: PRED FORTE ?PLACE 1 DROP INTO THE LEFT EYE 4 TIMES DAILY. ?  ?triamterene-hydrochlorothiazide 37.5-25 MG tablet ?Commonly known as: MAXZIDE-25 ?TAKE ONE TABLET BY MOUTH ONCE DAILY FOR BLOOD PRESSURE AND FLUID. ?  ?Vitamin D (Ergocalciferol) 1.25 MG (50000 UNIT) Caps capsule ?Commonly known as: DRISDOL ?Take 1 capsule (50,000 Units total) by mouth every 7 (seven) days. ?  ? ?  ? ? ?Meds ordered this encounter  ?Medications  ? triamterene-hydrochlorothiazide (MAXZIDE-25) 37.5-25 MG tablet  ?  Sig: TAKE ONE TABLET BY MOUTH ONCE DAILY FOR BLOOD PRESSURE AND FLUID.  ?  Dispense:  90 tablet  ?  Refill:  3  ? ? ? ? ?Follow-up: Return in about 6 months (around 03/07/2022). ? ?Claretta Fraise, M.D. ?

## 2021-09-05 LAB — CBC WITH DIFFERENTIAL/PLATELET
Basophils Absolute: 0.1 10*3/uL (ref 0.0–0.2)
Basos: 1 %
EOS (ABSOLUTE): 0.1 10*3/uL (ref 0.0–0.4)
Eos: 2 %
Hematocrit: 34.7 % — ABNORMAL LOW (ref 37.5–51.0)
Hemoglobin: 12.1 g/dL — ABNORMAL LOW (ref 13.0–17.7)
Immature Grans (Abs): 0 10*3/uL (ref 0.0–0.1)
Immature Granulocytes: 0 %
Lymphocytes Absolute: 1.7 10*3/uL (ref 0.7–3.1)
Lymphs: 37 %
MCH: 36.1 pg — ABNORMAL HIGH (ref 26.6–33.0)
MCHC: 34.9 g/dL (ref 31.5–35.7)
MCV: 104 fL — ABNORMAL HIGH (ref 79–97)
Monocytes Absolute: 0.5 10*3/uL (ref 0.1–0.9)
Monocytes: 10 %
Neutrophils Absolute: 2.3 10*3/uL (ref 1.4–7.0)
Neutrophils: 50 %
Platelets: 147 10*3/uL — ABNORMAL LOW (ref 150–450)
RBC: 3.35 x10E6/uL — ABNORMAL LOW (ref 4.14–5.80)
RDW: 12.9 % (ref 11.6–15.4)
WBC: 4.6 10*3/uL (ref 3.4–10.8)

## 2021-09-05 LAB — CMP14+EGFR
ALT: 11 IU/L (ref 0–44)
AST: 21 IU/L (ref 0–40)
Albumin/Globulin Ratio: 1.3 (ref 1.2–2.2)
Albumin: 4 g/dL (ref 3.5–4.6)
Alkaline Phosphatase: 44 IU/L (ref 44–121)
BUN/Creatinine Ratio: 17 (ref 10–24)
BUN: 22 mg/dL (ref 10–36)
Bilirubin Total: 0.5 mg/dL (ref 0.0–1.2)
CO2: 24 mmol/L (ref 20–29)
Calcium: 10 mg/dL (ref 8.6–10.2)
Chloride: 106 mmol/L (ref 96–106)
Creatinine, Ser: 1.32 mg/dL — ABNORMAL HIGH (ref 0.76–1.27)
Globulin, Total: 3.1 g/dL (ref 1.5–4.5)
Glucose: 91 mg/dL (ref 70–99)
Potassium: 3.7 mmol/L (ref 3.5–5.2)
Sodium: 143 mmol/L (ref 134–144)
Total Protein: 7.1 g/dL (ref 6.0–8.5)
eGFR: 51 mL/min/{1.73_m2} — ABNORMAL LOW (ref >59)

## 2021-09-05 LAB — LIPID PANEL
Chol/HDL Ratio: 3.3 ratio (ref 0.0–5.0)
Cholesterol, Total: 102 mg/dL (ref 100–199)
HDL: 31 mg/dL — ABNORMAL LOW (ref >39)
LDL Chol Calc (NIH): 52 mg/dL (ref 0–99)
Triglycerides: 98 mg/dL (ref 0–149)
VLDL Cholesterol Cal: 19 mg/dL (ref 5–40)

## 2021-09-05 NOTE — Progress Notes (Signed)
TEST ADD ON FORM COMPLETED AND TURNED INTO [facility] ?

## 2021-09-08 LAB — SPECIMEN STATUS REPORT

## 2021-09-08 LAB — FOLATE: Folate: 8.1 ng/mL

## 2021-09-08 LAB — VITAMIN B12: Vitamin B-12: 362 pg/mL (ref 232–1245)

## 2021-09-10 NOTE — Progress Notes (Signed)
Hello Yobani,  Your lab result is normal and/or stable.Some minor variations that are not significant are commonly marked abnormal, but do not represent any medical problem for you.  Best regards, Breiona Couvillon, M.D.

## 2021-09-13 ENCOUNTER — Other Ambulatory Visit: Payer: Self-pay | Admitting: Family Medicine

## 2021-09-13 DIAGNOSIS — G8929 Other chronic pain: Secondary | ICD-10-CM

## 2021-11-05 ENCOUNTER — Other Ambulatory Visit: Payer: Self-pay | Admitting: Family Medicine

## 2021-11-05 DIAGNOSIS — E782 Mixed hyperlipidemia: Secondary | ICD-10-CM

## 2021-12-21 ENCOUNTER — Encounter (INDEPENDENT_AMBULATORY_CARE_PROVIDER_SITE_OTHER): Payer: Medicare Other | Admitting: Ophthalmology

## 2021-12-24 ENCOUNTER — Other Ambulatory Visit: Payer: Self-pay | Admitting: Family Medicine

## 2021-12-24 DIAGNOSIS — G8929 Other chronic pain: Secondary | ICD-10-CM

## 2022-03-01 ENCOUNTER — Ambulatory Visit (INDEPENDENT_AMBULATORY_CARE_PROVIDER_SITE_OTHER): Payer: Medicare Other

## 2022-03-01 VITALS — Ht 69.0 in | Wt 190.0 lb

## 2022-03-01 DIAGNOSIS — Z0000 Encounter for general adult medical examination without abnormal findings: Secondary | ICD-10-CM | POA: Diagnosis not present

## 2022-03-01 NOTE — Patient Instructions (Signed)
Brett Blankenship , Thank you for taking time to come for your Medicare Wellness Visit. I appreciate your ongoing commitment to your health goals. Please review the following plan we discussed and let me know if I can assist you in the future.   These are the goals we discussed:  Goals      DIET - INCREASE WATER INTAKE     Increase water intake to 48 ounces per day.      Have 3 meals a day     Patient Stated     02/24/2020 AWV Goal: Keep All Scheduled Appointments  Over the next year, patient will attend all scheduled appointments with their PCP and any specialists that they see.         This is a list of the screening recommended for you and due dates:  Health Maintenance  Topic Date Due   COVID-19 Vaccine (1) Never done   Zoster (Shingles) Vaccine (1 of 2) Never done   Flu Shot  11/27/2021   Medicare Annual Wellness Visit  03/02/2023   Tetanus Vaccine  12/21/2029   Pneumonia Vaccine  Completed   HPV Vaccine  Aged Out    Advanced directives: Please bring a copy of your health care power of attorney and living will to the office to be added to your chart at your convenience.   Conditions/risks identified: Aim for 30 minutes of exercise or brisk walking, 6-8 glasses of water, and 5 servings of fruits and vegetables each day.   Next appointment: Follow up in one year for your annual wellness visit.   Preventive Care 83 Years and Older, Male  Preventive care refers to lifestyle choices and visits with your health care provider that can promote health and wellness. What does preventive care include? A yearly physical exam. This is also called an annual well check. Dental exams once or twice a year. Routine eye exams. Ask your health care provider how often you should have your eyes checked. Personal lifestyle choices, including: Daily care of your teeth and gums. Regular physical activity. Eating a healthy diet. Avoiding tobacco and drug use. Limiting alcohol use. Practicing  safe sex. Taking low doses of aspirin every day. Taking vitamin and mineral supplements as recommended by your health care provider. What happens during an annual well check? The services and screenings done by your health care provider during your annual well check will depend on your age, overall health, lifestyle risk factors, and family history of disease. Counseling  Your health care provider may ask you questions about your: Alcohol use. Tobacco use. Drug use. Emotional well-being. Home and relationship well-being. Sexual activity. Eating habits. History of falls. Memory and ability to understand (cognition). Work and work Statistician. Screening  You may have the following tests or measurements: Height, weight, and BMI. Blood pressure. Lipid and cholesterol levels. These may be checked every 5 years, or more frequently if you are over 24 years old. Skin check. Lung cancer screening. You may have this screening every year starting at age 66 if you have a 30-pack-year history of smoking and currently smoke or have quit within the past 15 years. Fecal occult blood test (FOBT) of the stool. You may have this test every year starting at age 70. Flexible sigmoidoscopy or colonoscopy. You may have a sigmoidoscopy every 5 years or a colonoscopy every 10 years starting at age 69. Prostate cancer screening. Recommendations will vary depending on your family history and other risks. Hepatitis C blood test. Hepatitis B  blood test. Sexually transmitted disease (STD) testing. Diabetes screening. This is done by checking your blood sugar (glucose) after you have not eaten for a while (fasting). You may have this done every 1-3 years. Abdominal aortic aneurysm (AAA) screening. You may need this if you are a current or former smoker. Osteoporosis. You may be screened starting at age 44 if you are at high risk. Talk with your health care provider about your test results, treatment options, and  if necessary, the need for more tests. Vaccines  Your health care provider may recommend certain vaccines, such as: Influenza vaccine. This is recommended every year. Tetanus, diphtheria, and acellular pertussis (Tdap, Td) vaccine. You may need a Td booster every 10 years. Zoster vaccine. You may need this after age 23. Pneumococcal 13-valent conjugate (PCV13) vaccine. One dose is recommended after age 46. Pneumococcal polysaccharide (PPSV23) vaccine. One dose is recommended after age 75. Talk to your health care provider about which screenings and vaccines you need and how often you need them. This information is not intended to replace advice given to you by your health care provider. Make sure you discuss any questions you have with your health care provider. Document Released: 05/12/2015 Document Revised: 01/03/2016 Document Reviewed: 02/14/2015 Elsevier Interactive Patient Education  2017 [place] Prevention in the Home Falls can cause injuries. They can happen to people of all ages. There are many things you can do to make your home safe and to help prevent falls. What can I do on the outside of my home? Regularly fix the edges of walkways and driveways and fix any cracks. Remove anything that might make you trip as you walk through a door, such as a raised step or threshold. Trim any bushes or trees on the path to your home. Use bright outdoor lighting. Clear any walking paths of anything that might make someone trip, such as rocks or tools. Regularly check to see if handrails are loose or broken. Make sure that both sides of any steps have handrails. Any raised decks and porches should have guardrails on the edges. Have any leaves, snow, or ice cleared regularly. Use sand or salt on walking paths during winter. Clean up any spills in your garage right away. This includes oil or grease spills. What can I do in the bathroom? Use night lights. Install grab bars by the  toilet and in the tub and shower. Do not use towel bars as grab bars. Use non-skid mats or decals in the tub or shower. If you need to sit down in the shower, use a plastic, non-slip stool. Keep the floor dry. Clean up any water that spills on the floor as soon as it happens. Remove soap buildup in the tub or shower regularly. Attach bath mats securely with double-sided non-slip rug tape. Do not have throw rugs and other things on the floor that can make you trip. What can I do in the bedroom? Use night lights. Make sure that you have a light by your bed that is easy to reach. Do not use any sheets or blankets that are too big for your bed. They should not hang down onto the floor. Have a firm chair that has side arms. You can use this for support while you get dressed. Do not have throw rugs and other things on the floor that can make you trip. What can I do in the kitchen? Clean up any spills right away. Avoid walking on wet floors. Keep items  that you use a lot in easy-to-reach places. If you need to reach something above you, use a strong step stool that has a grab bar. Keep electrical cords out of the way. Do not use floor polish or wax that makes floors slippery. If you must use wax, use non-skid floor wax. Do not have throw rugs and other things on the floor that can make you trip. What can I do with my stairs? Do not leave any items on the stairs. Make sure that there are handrails on both sides of the stairs and use them. Fix handrails that are broken or loose. Make sure that handrails are as long as the stairways. Check any carpeting to make sure that it is firmly attached to the stairs. Fix any carpet that is loose or worn. Avoid having throw rugs at the top or bottom of the stairs. If you do have throw rugs, attach them to the floor with carpet tape. Make sure that you have a light switch at the top of the stairs and the bottom of the stairs. If you do not have them, ask someone  to add them for you. What else can I do to help prevent falls? Wear shoes that: Do not have high heels. Have rubber bottoms. Are comfortable and fit you well. Are closed at the toe. Do not wear sandals. If you use a stepladder: Make sure that it is fully opened. Do not climb a closed stepladder. Make sure that both sides of the stepladder are locked into place. Ask someone to hold it for you, if possible. Clearly mark and make sure that you can see: Any grab bars or handrails. First and last steps. Where the edge of each step is. Use tools that help you move around (mobility aids) if they are needed. These include: Canes. Walkers. Scooters. Crutches. Turn on the lights when you go into a dark area. Replace any light bulbs as soon as they burn out. Set up your furniture so you have a clear path. Avoid moving your furniture around. If any of your floors are uneven, fix them. If there are any pets around you, be aware of where they are. Review your medicines with your doctor. Some medicines can make you feel dizzy. This can increase your chance of falling. Ask your doctor what other things that you can do to help prevent falls. This information is not intended to replace advice given to you by your health care provider. Make sure you discuss any questions you have with your health care provider. Document Released: 02/09/2009 Document Revised: 09/21/2015 Document Reviewed: 05/20/2014 Elsevier Interactive Patient Education  2017 [employer].

## 2022-03-01 NOTE — Progress Notes (Signed)
Subjective:   Brett Blankenship is a 88 y.o. male who presents for Medicare Annual/Subsequent preventive examination.   I connected with  AMATO SEVILLANO on 03/01/22 by a audio enabled telemedicine application and verified that I am speaking with the correct person using two identifiers.  Patient Location: Home  Provider Location: Home Office  I discussed the limitations of evaluation and management by telemedicine. The patient expressed understanding and agreed to proceed.  Review of Systems     Cardiac Risk Factors include: advanced age (>35mn, >>32women);hypertension     Objective:    Today's Vitals   03/01/22 0859  Weight: 190 lb (86.2 kg)  Height: '5\' 9"'$  (1.753 m)   Body mass index is 28.06 kg/m.     03/01/2022    9:06 AM 02/28/2021    3:52 PM 02/24/2020    8:54 AM 02/22/2019   10:50 AM 12/14/2018    1:19 PM 12/04/2018    1:23 PM 11/18/2017    3:47 PM  Advanced Directives  Does Patient Have a Medical Advance Directive? Yes No Yes Yes Yes Yes No  Type of [place]amed[place]of [place] will  [place] will Healthcare Power of [place] will   Does patient want to make changes to medical advance directive?    No - Patient declined     Copy of HMonain Chart? No - copy requested  No - copy requested No - copy requested     Would patient like information on creating a medical advance directive?  No - Patient declined No - Patient declined    Yes (MAU/Ambulatory/Procedural Areas - Information given)    Current Medications (verified) Outpatient Encounter Medications as of 03/01/2022  Medication Sig   amLODipine (NORVASC) 10 MG tablet TAKE (1) TABLET BY MOUTH ONCE DAILY.   fenofibrate 160 MG tablet TAKE 1 TABLET BY MOUTH ONCE A DAY.   fish oil-omega-3 fatty acids 1000 MG capsule Take 2 g by mouth daily.   Multiple Vitamins-Minerals (EYE VITAMINS & MINERALS PO) Take  by mouth. [facility]   nabumetone (RELAFEN) 500 MG tablet TAKE 2 TABLETS BY MOUTH TWICE DAILY AS NEEDED FOR PAIN.   nitroGLYCERIN (NITROSTAT) 0.3 MG SL tablet Place 1 tablet (0.3 mg total) under the tongue every 5 (five) minutes as needed for chest pain.   prednisoLONE acetate (PRED FORTE) 1 % ophthalmic suspension PLACE 1 DROP INTO THE LEFT EYE 4 TIMES DAILY.   triamterene-hydrochlorothiazide (MAXZIDE-25) 37.5-25 MG tablet TAKE ONE TABLET BY MOUTH ONCE DAILY FOR BLOOD PRESSURE AND FLUID.   Vitamin D, Ergocalciferol, (DRISDOL) 1.25 MG (50000 UNIT) CAPS capsule Take 1 capsule (50,000 Units total) by mouth every 7 (seven) days.   No facility-administered encounter medications on file as of 03/01/2022.    Allergies (verified) Patient has no known allergies.   History: Past Medical History:  Diagnosis Date   HYPERTENSION 07/12/2009   Past Surgical History:  Procedure Laterality Date   APPENDECTOMY  1960   Family History  Problem Relation Age of Onset   Hypertension Father    Heart disease Father    Cancer Brother        colon, lung   Social History   Socioeconomic History   Marital status: Widowed    Spouse name: Not on file   Number of children: 2   Years of education: 7   Highest education level: 7th grade  Occupational History   Occupation:  retired  Tobacco Use   Smoking status: Former    Packs/day: 0.30    Years: 5.00    Total pack years: 1.50    Types: Cigarettes    Quit date: 09/12/1963    Years since quitting: 58.5   Smokeless tobacco: Never  Vaping Use   Vaping Use: Never used  Substance and Sexual Activity   Alcohol use: Not Currently   Drug use: Not Currently   Sexual activity: Not Currently  Other Topics Concern   Not on file  Social History Narrative   Lives alone - daughter in law takes him shopping once a week and out to eat   Grandchildren and son stop by often   Social Determinants of Health   Financial Resource Strain: Low Risk  (03/01/2022)    Overall Financial Resource Strain (CARDIA)    Difficulty of Paying Living Expenses: Not hard at all  Food Insecurity: No Food Insecurity (03/01/2022)   Hunger Vital Sign    Worried About Running Out of Food in the Last Year: Never true    [place] in the Last Year: Never true  Transportation Needs: No Transportation Needs (03/01/2022)   PRAPARE - Hydrologist (Medical): No    Lack of Transportation (Non-Medical): No  Physical Activity: Sufficiently Active (03/01/2022)   Exercise Vital Sign    Days of Exercise per Week: 5 days    Minutes of Exercise per Session: 30 min  Stress: No Stress Concern Present (03/01/2022)   [place]    Feeling of Stress : Not at all  Social Connections: Moderately Isolated (03/01/2022)   Social Connection and Isolation Panel [NHANES]    Frequency of Communication with Friends and Family: More than three times a week    Frequency of Social Gatherings with Friends and Family: More than three times a week    Attends Religious Services: More than 4 times per year    Active Member of [employer] or Organizations: No    Attends Archivist Meetings: Never    Marital Status: Widowed    Tobacco Counseling Counseling given: Not Answered   Clinical Intake:  Pre-visit preparation completed: Yes  Pain : No/denies pain     Nutritional Risks: None Diabetes: No  How often do you need to have someone help you when you read instructions, pamphlets, or other written materials from your doctor or pharmacy?: 1 - Never  Diabetic?no   Interpreter Needed?: No  Information entered by :: Jadene Pierini, LPN   Activities of Daily Living    03/01/2022    9:05 AM  In your present state of health, do you have any difficulty performing the following activities:  Hearing? 0  Vision? 0  Difficulty concentrating or making decisions? 0  Walking or climbing stairs? 0   Dressing or bathing? 0  Doing errands, shopping? 0  Preparing Food and eating ? N  Using the Toilet? N  In the past six months, have you accidently leaked urine? N  Do you have problems with loss of bowel control? N  Managing your Medications? N  Managing your Finances? N  Housekeeping or managing your Housekeeping? N    Patient Care Team: Claretta Fraise, MD as PCP - General (Family Medicine) Hayden Pedro, MD as Consulting Physician (Ophthalmology)  Indicate any recent Medical Services you may have received from other than [facility] providers in the past year (date may be approximate).  Assessment:   This is a routine wellness examination for [place].  Hearing/Vision screen Vision Screening - Comments:: Annual eye exams wears glasses   Dietary issues and exercise activities discussed: Current Exercise Habits: Home exercise routine, Type of exercise: walking, Time (Minutes): 30, Frequency (Times/Week): 5, Weekly Exercise (Minutes/Week): 150, Intensity: Mild, Exercise limited by: orthopedic condition(s)   Goals Addressed             This Visit's Progress    DIET - INCREASE WATER INTAKE   On track    Increase water intake to 48 ounces per day.        Depression Screen    03/01/2022    9:03 AM 09/04/2021    8:20 AM 03/07/2021    8:54 AM 02/28/2021    3:48 PM 07/19/2020    9:48 AM 04/03/2020    4:29 PM 02/24/2020    8:56 AM  PHQ 2/9 Scores  PHQ - 2 Score 0 0 0 0 0 0 0    Fall Risk    03/01/2022    9:01 AM 09/04/2021    8:20 AM 03/07/2021    8:54 AM 02/28/2021    3:50 PM 07/19/2020    9:54 AM  [place] in the past year? 0 0 0 0 1  Number falls in past yr: 0   0 0  Injury with Fall? 0   0 1  Risk for fall due to : No Fall Risks   No Fall Risks History of fall(s)  Follow up Falls prevention discussed   Falls prevention discussed Falls evaluation completed    FALL RISK PREVENTION PERTAINING TO THE HOME:  Any stairs in or around the home? No  If so, are  there any without handrails? No  Home free of loose throw rugs in walkways, pet beds, electrical cords, etc? Yes  Adequate lighting in your home to reduce risk of falls? Yes   ASSISTIVE DEVICES UTILIZED TO PREVENT FALLS:  Life alert? Yes  Use of a cane, walker or w/c? Yes  Grab bars in the bathroom? Yes  Shower chair or bench in shower? Yes  Elevated toilet seat or a handicapped toilet? Yes       11/19/2017    8:23 AM  MMSE - Mini Mental State Exam  Orientation to time 5  Orientation to Place 4  Registration 3  Attention/ Calculation 4  Recall 1  Language- name 2 objects 2  Language- repeat 1  Language- follow 3 step command 3  Language- read & follow direction 1  Write a sentence 1  Copy design 1  Total score 26        03/01/2022    9:05 AM 02/28/2021    3:57 PM 02/24/2020    9:01 AM 02/22/2019   11:01 AM  6CIT Screen  What Year? 0 points 0 points 0 points 0 points  What month? 0 points 0 points 0 points 0 points  What time? 0 points 0 points 0 points 0 points  Count back from 20 0 points 0 points 0 points 0 points  Months in reverse 0 points 0 points 0 points 0 points  Repeat phrase 0 points 6 points 10 points 2 points  Total Score 0 points 6 points 10 points 2 points    Immunizations Immunization History  Administered Date(s) Administered   Fluad Quad(high Dose 65+) 04/03/2020   Influenza,inj,Quad PF,6+ Mos 01/29/2013   Pneumococcal Conjugate-13 03/29/2013   Pneumococcal Polysaccharide-23 01/20/2020  Tdap 11/18/2017, 12/22/2019    TDAP status: Up to date  Flu Vaccine status: Declined, Education has been provided regarding the importance of this vaccine but patient still declined. Advised may receive this vaccine at local pharmacy or Health Dept. Aware to provide a copy of the vaccination record if obtained from local pharmacy or Health Dept. Verbalized acceptance and understanding.  Pneumococcal vaccine status: Up to date  Covid-19 vaccine status:  Completed vaccines  Qualifies for Shingles Vaccine? Yes   Zostavax completed No   Shingrix Completed?: No.    Education has been provided regarding the importance of this vaccine. Patient has been advised to call insurance company to determine out of pocket expense if they have not yet received this vaccine. Advised may also receive vaccine at local pharmacy or Health Dept. Verbalized acceptance and understanding.  Screening Tests Health Maintenance  Topic Date Due   COVID-19 Vaccine (1) Never done   Zoster Vaccines- Shingrix (1 of 2) Never done   INFLUENZA VACCINE  11/27/2021   Medicare Annual Wellness (AWV)  03/02/2023   TETANUS/TDAP  12/21/2029   Pneumonia Vaccine 62+ Years old  Completed   HPV VACCINES  Aged Out    Health Maintenance  Health Maintenance Due  Topic Date Due   COVID-19 Vaccine (1) Never done   Zoster Vaccines- Shingrix (1 of 2) Never done   INFLUENZA VACCINE  11/27/2021    Colorectal cancer screening: No longer required.   Lung Cancer Screening: (Low Dose CT Chest recommended if Age 37-80 years, 30 pack-year currently smoking OR have quit w/in 15years.) does not qualify.   Lung Cancer Screening Referral: n/a  Additional Screening:  Hepatitis C Screening: does not qualify;   Vision Screening: Recommended annual ophthalmology exams for early detection of glaucoma and other disorders of the eye. Is the patient up to date with their annual eye exam?  Yes  Who is the provider or what is the name of the office in which the patient attends annual eye exams? Dr.Matthew  If pt is not established with a provider, would they like to be referred to a provider to establish care? No .   Dental Screening: Recommended annual dental exams for proper oral hygiene  Community Resource Referral / Chronic Care Management: CRR required this visit?  No   CCM required this visit?  No      Plan:     I have personally reviewed and noted the following in the patient's  chart:   Medical and social history Use of alcohol, tobacco or illicit drugs  Current medications and supplements including opioid prescriptions. Patient is not currently taking opioid prescriptions. Functional ability and status Nutritional status Physical activity Advanced directives List of other physicians Hospitalizations, surgeries, and ER visits in previous 12 months Vitals Screenings to include cognitive, depression, and falls Referrals and appointments  In addition, I have reviewed and discussed with patient certain preventive protocols, quality metrics, and best practice recommendations. A written personalized care plan for preventive services as well as general preventive health recommendations were provided to patient.     Daphane Shepherd, LPN   01/02/453   Nurse Notes: none

## 2022-03-05 ENCOUNTER — Other Ambulatory Visit: Payer: Self-pay | Admitting: Family Medicine

## 2022-03-07 ENCOUNTER — Ambulatory Visit: Payer: Medicare Other | Admitting: Family Medicine

## 2022-04-08 ENCOUNTER — Other Ambulatory Visit: Payer: Self-pay | Admitting: Family Medicine

## 2022-04-08 DIAGNOSIS — G8929 Other chronic pain: Secondary | ICD-10-CM

## 2022-04-13 ENCOUNTER — Other Ambulatory Visit: Payer: Self-pay | Admitting: Family Medicine

## 2022-04-13 DIAGNOSIS — I10 Essential (primary) hypertension: Secondary | ICD-10-CM

## 2022-04-18 ENCOUNTER — Ambulatory Visit (INDEPENDENT_AMBULATORY_CARE_PROVIDER_SITE_OTHER): Payer: Medicare Other | Admitting: Family Medicine

## 2022-04-18 ENCOUNTER — Encounter: Payer: Self-pay | Admitting: Family Medicine

## 2022-04-18 VITALS — BP 129/62 | HR 86 | Temp 96.8°F | Ht 69.0 in | Wt 181.6 lb

## 2022-04-18 DIAGNOSIS — E782 Mixed hyperlipidemia: Secondary | ICD-10-CM

## 2022-04-18 DIAGNOSIS — E559 Vitamin D deficiency, unspecified: Secondary | ICD-10-CM | POA: Diagnosis not present

## 2022-04-18 DIAGNOSIS — I10 Essential (primary) hypertension: Secondary | ICD-10-CM | POA: Diagnosis not present

## 2022-04-18 MED ORDER — FEXOFENADINE HCL 180 MG PO TABS: 180.0000 mg | ORAL_TABLET | Freq: Every day | ORAL | 11 refills | Status: DC

## 2022-04-18 NOTE — Progress Notes (Signed)
Subjective:  Patient ID: Brett Blankenship, male    DOB: 1927/03/11  Age: 88 y.o. MRN: [id number]  CC: Medical Management of Chronic Issues   HPI Brett Blankenship presents for  follow-up of hypertension. Patient has no history of headache chest pain or shortness of breath or recent cough. Patient also denies symptoms of TIA such as focal numbness or weakness. Patient denies side effects from medication. States taking it regularly.   in for follow-up of elevated cholesterol. Doing well without complaints on current medication. Denies side effects of statin including myalgia and arthralgia and nausea. Currently no chest pain, shortness of breath or other cardiovascular related symptoms noted.   A few aches and pains at night. Has to roll over sometimes.   Getting slower. History Brett Blankenship has a past medical history of HYPERTENSION (07/12/2009).   He has a past surgical history that includes Appendectomy (1960).   His family history includes Cancer in his brother; Heart disease in his father; Hypertension in his father.He reports that he quit smoking about 58 years ago. His smoking use included cigarettes. He has a 1.50 pack-year smoking history. He has never used smokeless tobacco. He reports that he does not currently use alcohol. He reports that he does not currently use drugs.  Current Outpatient Medications on File Prior to Visit  Medication Sig Dispense Refill   amLODipine (NORVASC) 10 MG tablet TAKE (1) TABLET BY MOUTH ONCE DAILY. 90 tablet 0   fenofibrate 160 MG tablet TAKE 1 TABLET BY MOUTH ONCE A DAY. 90 tablet 1   fish oil-omega-3 fatty acids 1000 MG capsule Take 2 g by mouth daily.     Multiple Vitamins-Minerals (EYE VITAMINS & MINERALS PO) Take by mouth. [facility]     nabumetone (RELAFEN) 500 MG tablet TAKE 2 TABLETS BY MOUTH TWICE DAILY AS NEEDED FOR PAIN. 360 tablet 0   nitroGLYCERIN (NITROSTAT) 0.3 MG SL tablet Place 1 tablet (0.3 mg total) under the tongue every 5 (five)  minutes as needed for chest pain. 90 tablet 12   prednisoLONE acetate (PRED FORTE) 1 % ophthalmic suspension PLACE 1 DROP INTO THE LEFT EYE 4 TIMES DAILY. 10 mL 0   triamterene-hydrochlorothiazide (MAXZIDE-25) 37.5-25 MG tablet TAKE ONE TABLET BY MOUTH ONCE DAILY FOR BLOOD PRESSURE AND FLUID. 90 tablet 3   Vitamin D, Ergocalciferol, 50000 units CAPS Take 1 capsule (50,000 Units total) by mouth every 7 (seven) days. 13 capsule 3   No current facility-administered medications on file prior to visit.    ROS Review of Systems  Constitutional:  Negative for fever.  Respiratory:  Negative for shortness of breath.   Cardiovascular:  Negative for chest pain.  Musculoskeletal:  Positive for arthralgias.  Skin:  Negative for rash.    Objective:  BP 129/62   Pulse 86   Temp (!) 96.8 F (36 C)   Ht _0  (1.753 m)   Wt 181 lb 9.6 oz (82.4 kg)   SpO2 99%   BMI 26.82 kg/m   BP Readings from Last 3 Encounters:  04/18/22 129/62  09/04/21 (!) 143/67  03/07/21 135/71    Wt Readings from Last 3 Encounters:  04/18/22 181 lb 9.6 oz (82.4 kg)  03/01/22 190 lb (86.2 kg)  09/04/21 188 lb 9.6 oz (85.5 kg)     Physical Exam Vitals reviewed.  Constitutional:      Appearance: He is well-developed.  HENT:     Head: Normocephalic and atraumatic.     Right Ear: External ear  normal.     Left Ear: External ear normal.     Mouth/Throat:     Pharynx: No oropharyngeal exudate or posterior oropharyngeal erythema.  Eyes:     Pupils: Pupils are equal, round, and reactive to light.  Cardiovascular:     Rate and Rhythm: Normal rate and regular rhythm.     Heart sounds: No murmur heard. Pulmonary:     Effort: No respiratory distress.     Breath sounds: Normal breath sounds.  Musculoskeletal:     Cervical back: Normal range of motion and neck supple.  Neurological:     Mental Status: He is alert and oriented to person, place, and time.       Assessment & Plan:   Brett Blankenship was seen today for  medical management of chronic issues.  Diagnoses and all orders for this visit:  Essential hypertension -     CBC with Differential/Platelet -     CMP14+EGFR  Mixed hyperlipidemia -     Lipid panel  Vitamin D deficiency -     VITAMIN D 25 Hydroxy (Vit-D Deficiency, Fractures)  Other orders -     fexofenadine (ALLEGRA) 180 MG tablet; Take 1 tablet (180 mg total) by mouth daily. For allergy symptoms   Allergies as of 04/18/2022   No Known Allergies      Medication List        Accurate as of April 18, 2022  1:56 PM. If you have any questions, ask your nurse or doctor.          amLODipine 10 MG tablet Commonly known as: NORVASC TAKE (1) TABLET BY MOUTH ONCE DAILY.   EYE VITAMINS & MINERALS PO Take by mouth. [facility]   fenofibrate 160 MG tablet TAKE 1 TABLET BY MOUTH ONCE A DAY.   fexofenadine 180 MG tablet Commonly known as: ALLEGRA Take 1 tablet (180 mg total) by mouth daily. For allergy symptoms Started by: Claretta Fraise, MD   fish oil-omega-3 fatty acids 1000 MG capsule Take 2 g by mouth daily.   nabumetone 500 MG tablet Commonly known as: RELAFEN TAKE 2 TABLETS BY MOUTH TWICE DAILY AS NEEDED FOR PAIN.   nitroGLYCERIN 0.3 MG SL tablet Commonly known as: NITROSTAT Place 1 tablet (0.3 mg total) under the tongue every 5 (five) minutes as needed for chest pain.   prednisoLONE acetate 1 % ophthalmic suspension Commonly known as: PRED FORTE PLACE 1 DROP INTO THE LEFT EYE 4 TIMES DAILY.   triamterene-hydrochlorothiazide 37.5-25 MG tablet Commonly known as: MAXZIDE-25 TAKE ONE TABLET BY MOUTH ONCE DAILY FOR BLOOD PRESSURE AND FLUID.   Vitamin D (Ergocalciferol) 50000 units Caps Take 1 capsule (50,000 Units total) by mouth every 7 (seven) days.        Meds ordered this encounter  Medications   fexofenadine (ALLEGRA) 180 MG tablet    Sig: Take 1 tablet (180 mg total) by mouth daily. For allergy symptoms    Dispense:  30 tablet    Refill:  11       Follow-up: Return in about 6 months (around 10/18/2022).  Claretta Fraise, M.D.

## 2022-04-19 LAB — CMP14+EGFR
ALT: 15 IU/L (ref 0–44)
AST: 33 IU/L (ref 0–40)
Albumin/Globulin Ratio: 1.2 (ref 1.2–2.2)
Albumin: 3.7 g/dL (ref 3.6–4.6)
Alkaline Phosphatase: 43 IU/L — ABNORMAL LOW (ref 44–121)
BUN/Creatinine Ratio: 18 (ref 10–24)
BUN: 26 mg/dL (ref 10–36)
Bilirubin Total: 0.4 mg/dL (ref 0.0–1.2)
CO2: 23 mmol/L (ref 20–29)
Calcium: 9.8 mg/dL (ref 8.6–10.2)
Chloride: 102 mmol/L (ref 96–106)
Creatinine, Ser: 1.48 mg/dL — ABNORMAL HIGH (ref 0.76–1.27)
Globulin, Total: 3.2 g/dL (ref 1.5–4.5)
Glucose: 85 mg/dL (ref 70–99)
Potassium: 4 mmol/L (ref 3.5–5.2)
Sodium: 140 mmol/L (ref 134–144)
Total Protein: 6.9 g/dL (ref 6.0–8.5)
eGFR: 44 mL/min/{1.73_m2} — ABNORMAL LOW (ref >59)

## 2022-04-19 LAB — CBC WITH DIFFERENTIAL/PLATELET
Basophils Absolute: 0.1 10*3/uL (ref 0.0–0.2)
Basos: 1 %
EOS (ABSOLUTE): 0.1 10*3/uL (ref 0.0–0.4)
Eos: 1 %
Hematocrit: 33.9 % — ABNORMAL LOW (ref 37.5–51.0)
Hemoglobin: 12.2 g/dL — ABNORMAL LOW (ref 13.0–17.7)
Immature Grans (Abs): 0 10*3/uL (ref 0.0–0.1)
Immature Granulocytes: 0 %
Lymphocytes Absolute: 2.1 10*3/uL (ref 0.7–3.1)
Lymphs: 36 %
MCH: 36.5 pg — ABNORMAL HIGH (ref 26.6–33.0)
MCHC: 36 g/dL — ABNORMAL HIGH (ref 31.5–35.7)
MCV: 102 fL — ABNORMAL HIGH (ref 79–97)
Monocytes Absolute: 0.4 10*3/uL (ref 0.1–0.9)
Monocytes: 8 %
Neutrophils Absolute: 3.1 10*3/uL (ref 1.4–7.0)
Neutrophils: 54 %
Platelets: 142 10*3/uL — ABNORMAL LOW (ref 150–450)
RBC: 3.34 x10E6/uL — ABNORMAL LOW (ref 4.14–5.80)
RDW: 12.7 % (ref 11.6–15.4)
WBC: 5.7 10*3/uL (ref 3.4–10.8)

## 2022-04-19 LAB — LIPID PANEL
Chol/HDL Ratio: 3.8 ratio (ref 0.0–5.0)
Cholesterol, Total: 98 mg/dL — ABNORMAL LOW (ref 100–199)
HDL: 26 mg/dL — ABNORMAL LOW (ref >39)
LDL Chol Calc (NIH): 50 mg/dL (ref 0–99)
Triglycerides: 121 mg/dL (ref 0–149)
VLDL Cholesterol Cal: 22 mg/dL (ref 5–40)

## 2022-04-19 LAB — VITAMIN D 25 HYDROXY (VIT D DEFICIENCY, FRACTURES): Vit D, 25-Hydroxy: 56.7 ng/mL (ref 30.0–100.0)

## 2022-04-30 NOTE — Progress Notes (Signed)
Hello Brett Blankenship,  Your lab result is normal and/or stable.Some minor variations that are not significant are commonly marked abnormal, but do not represent any medical problem for you.  Best regards, Claretta Fraise, M.D.

## 2022-05-04 ENCOUNTER — Encounter: Payer: Self-pay | Admitting: Emergency Medicine

## 2022-05-04 ENCOUNTER — Ambulatory Visit (INDEPENDENT_AMBULATORY_CARE_PROVIDER_SITE_OTHER): Payer: Medicare Other

## 2022-05-04 ENCOUNTER — Ambulatory Visit
Admission: EM | Admit: 2022-05-04 | Discharge: 2022-05-04 | Disposition: A | Discharge status: Home / Self Care | Payer: Medicare Other | Attending: Nurse Practitioner | Admitting: Nurse Practitioner

## 2022-05-04 DIAGNOSIS — M7989 Other specified soft tissue disorders: Secondary | ICD-10-CM | POA: Diagnosis not present

## 2022-05-04 DIAGNOSIS — M25532 Pain in left wrist: Secondary | ICD-10-CM | POA: Diagnosis not present

## 2022-05-04 DIAGNOSIS — W19XXXA Unspecified fall, initial encounter: Secondary | ICD-10-CM

## 2022-05-04 NOTE — ED Triage Notes (Signed)
Fell yesterday.  Left wrist pain.  Wrist swollen

## 2022-05-04 NOTE — Discharge Instructions (Signed)
Keep the splint in place until seen by orthopedics.  As discussed, recommend follow-up with orthopedics on 1/8 or 1/9 for further evaluation. May take over-the-counter Tylenol as needed for pain or discomfort. RICE therapy, rest, ice, compression, and elevation. Apply ice for 20 minutes, remove for 1 hour, then repeat is much as possible. Avoid getting the splint wet while it is in place, and do not remove the splint until it is removed by orthopedics. Recommend follow-up with [facility] at [phone] or with [facility] in [place] at [phone]. Follow-up as needed.

## 2022-05-04 NOTE — ED Provider Notes (Signed)
[facility]    CSN: [id number] Arrival date & time: 05/04/22  1255      History   Chief Complaint No chief complaint on file.   HPI Brett Blankenship is a 89 y.o. male.   The history is provided by the patient and a relative.   Patient presents with his family for complaints of left wrist pain.  Patient's family member reports patient fell onto his knees with his arms outstretched 1 day ago.  He states since that time, patient has developed pain and swelling to the left wrist.  Pain is located on the outside of the left wrist.  Patient states that he is able to move his fingers, but he has pain when doing so.  The left wrist has bruising and swelling noted.  Patient denies inability to move his hands.  But does have decreased range of motion to the left wrist.  Past Medical History:  Diagnosis Date   HYPERTENSION 07/12/2009    Patient Active Problem List   Diagnosis Date Noted   Mixed hyperlipidemia 09/04/2021   Vitamin D deficiency 28/76/8115   Systolic murmur 72/62/0355   Bradycardia 12/01/2016   Chronic pain of left knee 10/10/2016   Essential hypertension 07/12/2009    Past Surgical History:  Procedure Laterality Date   APPENDECTOMY  1960       Home Medications    Prior to Admission medications   Medication Sig Start Date End Date Taking? Authorizing Provider  amLODipine (NORVASC) 10 MG tablet TAKE (1) TABLET BY MOUTH ONCE DAILY. 04/15/22   Claretta Fraise, MD  fenofibrate 160 MG tablet TAKE 1 TABLET BY MOUTH ONCE A DAY. 11/05/21   Claretta Fraise, MD  fexofenadine (ALLEGRA) 180 MG tablet Take 1 tablet (180 mg total) by mouth daily. For allergy symptoms 04/18/22   Claretta Fraise, MD  fish oil-omega-3 fatty acids 1000 MG capsule Take 2 g by mouth daily.    [provider]  Multiple Vitamins-Minerals (EYE VITAMINS & MINERALS PO) Take by mouth. [facility]    [provider]  nabumetone (RELAFEN) 500 MG tablet TAKE 2 TABLETS BY MOUTH TWICE  DAILY AS NEEDED FOR PAIN. 04/08/22   Claretta Fraise, MD  nitroGLYCERIN (NITROSTAT) 0.3 MG SL tablet Place 1 tablet (0.3 mg total) under the tongue every 5 (five) minutes as needed for chest pain. 03/07/21   Claretta Fraise, MD  prednisoLONE acetate (PRED FORTE) 1 % ophthalmic suspension PLACE 1 DROP INTO THE LEFT EYE 4 TIMES DAILY. 10/12/19   Claretta Fraise, MD  triamterene-hydrochlorothiazide (MAXZIDE-25) 37.5-25 MG tablet TAKE ONE TABLET BY MOUTH ONCE DAILY FOR BLOOD PRESSURE AND FLUID. 09/04/21   Claretta Fraise, MD  Vitamin D, Ergocalciferol, 50000 units CAPS Take 1 capsule (50,000 Units total) by mouth every 7 (seven) days. 03/05/22   Claretta Fraise, MD    Family History Family History  Problem Relation Age of Onset   Hypertension Father    Heart disease Father    Cancer Brother        colon, lung    Social History Social History   Tobacco Use   Smoking status: Former    Packs/day: 0.30    Years: 5.00    Total pack years: 1.50    Types: Cigarettes    Quit date: 09/12/1963    Years since quitting: 58.6   Smokeless tobacco: Never  Vaping Use   Vaping Use: Never used  Substance Use Topics   Alcohol use: Not Currently   Drug use: Not  Currently     Allergies   Patient has no known allergies.   Review of Systems Review of Systems Per HPI  Physical Exam Triage Vital Signs ED Triage Vitals  Enc Vitals Group     BP 05/04/22 1335 (!) 151/70     Pulse Rate 05/04/22 1335 83     Resp 05/04/22 1335 18     Temp 05/04/22 1335 98 F (36.7 C)     Temp Source 05/04/22 1335 Oral     SpO2 05/04/22 1335 99 %     Weight --      Height --      Head Circumference --      Peak Flow --      Pain Score 05/04/22 1336 4     Pain Loc --      Pain Edu? --      Excl. in [place]? --    No data found.  Updated Vital Signs BP (!) 151/70 (BP Location: Right Arm)   Pulse 83   Temp 98 F (36.7 C) (Oral)   Resp 18   SpO2 99%   Visual Acuity Right Eye Distance:   Left Eye Distance:    Bilateral Distance:    Right Eye Near:   Left Eye Near:    Bilateral Near:     Physical Exam Vitals and nursing note reviewed.  Constitutional:      General: He is not in acute distress.    Appearance: Normal appearance.  HENT:     Head: Normocephalic.  Eyes:     Extraocular Movements: Extraocular movements intact.     Pupils: Pupils are equal, round, and reactive to light.  Pulmonary:     Effort: Pulmonary effort is normal.  Musculoskeletal:     Left wrist: Swelling and tenderness (ulnar aspect of left wrist) present. Decreased range of motion. Normal pulse.     Cervical back: Normal range of motion.  Lymphadenopathy:     Cervical: No cervical adenopathy.  Skin:    General: Skin is warm and dry.  Neurological:     General: No focal deficit present.     Mental Status: He is alert and oriented to person, place, and time.  Psychiatric:        Mood and Affect: Mood normal.        Behavior: Behavior normal.      UC Treatments / Results  Labs (all labs ordered are listed, but only abnormal results are displayed) Labs Reviewed - No data to display  EKG   Radiology DG Wrist Complete Left  Result Date: 05/04/2022 CLINICAL DATA:  Pain along the ulnar side of the wrist after fall. EXAM: LEFT WRIST - COMPLETE 3+ VIEW COMPARISON:  None Available. FINDINGS: The distal radius and ulna are intact. Soft tissue swelling is identified adjacent to the distal ulna, triquetrum, and pisiform. On oblique imaging, there is an age indeterminate lucency, probably through the triquetrum. No other suspicious abnormalities identified. IMPRESSION: 1. Soft tissue swelling adjacent to the distal ulna, triquetrum, and pisiform. 2. Age-indeterminate lucency through the triquetrum on oblique imaging. Given overlapping soft tissue swelling and pain in this region, an acute fracture is not excluded. 3. No other suspicious abnormalities. Electronically Signed   By: Dorise Bullion III M.D.   On: 05/04/2022  13:53    Procedures Procedures (including critical care time)  Medications Ordered in UC Medications - No data to display  Initial Impression / Assessment and Plan / UC Course  I have  reviewed the triage vital signs and the nursing notes.  Pertinent labs & imaging results that were available during my care of the patient were reviewed by me and considered in my medical decision making (see chart for details).  The patient is well-appearing, he is in no acute distress, vital signs are stable.  Left wrist pain and swelling status post fall approximately 1 day ago.  X-rays cannot rule out an acute fracture, based on this, sugar-tong splint was applied to the left wrist for immobilization until patient can be seen by orthopedics.  Supportive care recommendations were provided to the patient to include RICE therapy, Tylenol for pain or discomfort.  Patient's family member was given the information for [facility] and for [facility] and reads full for follow-up on 1/8 or 1/9.  Patient's family member verbalizes understanding.  All questions were answered.  Patient is stable for discharge.  Final Clinical Impressions(s) / UC Diagnoses   Final diagnoses:  Left wrist pain  Fall, initial encounter     Discharge Instructions      Keep the splint in place until seen by orthopedics.  As discussed, recommend follow-up with orthopedics on 1/8 or 1/9 for further evaluation. May take over-the-counter Tylenol as needed for pain or discomfort. RICE therapy, rest, ice, compression, and elevation. Apply ice for 20 minutes, remove for 1 hour, then repeat is much as possible. Avoid getting the splint wet while it is in place, and do not remove the splint until it is removed by orthopedics. Recommend follow-up with [facility] [place] at [phone] or with [facility] in [place] at [phone]. Follow-up as needed.      ED Prescriptions   None    PDMP not reviewed this  encounter.   Tish Men, NP 05/04/22 1424

## 2022-05-06 ENCOUNTER — Telehealth: Payer: Self-pay | Admitting: Orthopaedic Surgery

## 2022-05-06 DIAGNOSIS — M25532 Pain in left wrist: Secondary | ICD-10-CM | POA: Diagnosis not present

## 2022-05-06 NOTE — Telephone Encounter (Signed)
Brett Blankenship son called for Brett Blankenship - seen at [facility] on Saturday, left wrist - [phone] - NO FX, CAN SEE Dr. Luna Glasgow if willing.  Called the number left, lvm to call the office and scheduled.

## 2022-05-17 ENCOUNTER — Ambulatory Visit (INDEPENDENT_AMBULATORY_CARE_PROVIDER_SITE_OTHER): Payer: Medicare Other | Admitting: Family

## 2022-05-17 ENCOUNTER — Encounter: Payer: Self-pay | Admitting: Family

## 2022-05-17 VITALS — BP 152/78 | HR 82 | Temp 97.2°F | Ht 69.0 in | Wt 190.0 lb

## 2022-05-17 DIAGNOSIS — J019 Acute sinusitis, unspecified: Secondary | ICD-10-CM | POA: Diagnosis not present

## 2022-05-17 MED ORDER — DOXYCYCLINE HYCLATE 100 MG PO TABS: 100.0000 mg | ORAL_TABLET | Freq: Two times a day (BID) | ORAL | 0 refills | Status: DC

## 2022-05-17 NOTE — Progress Notes (Signed)
Subjective:    Patient ID: Brett Blankenship, male    DOB: Oct 24, 1926, 89 y.o.   MRN: [id number]  Chief Complaint  Patient presents with   URI    Since before December seen stacks    Pt presents to the office today with recurrent sinus congestion for over a month. He saw his PCP on 04/19/23 and was given Allegra without relief.  Sinusitis This is a new problem. The current episode started more than 1 month ago. The problem is unchanged. Maximum temperature: low grade. His pain is at a severity of 3/10. The pain is moderate. Associated symptoms include congestion, coughing, headaches, sinus pressure and sneezing. Pertinent negatives include no chills, shortness of breath or sore throat. Ear pain: ear congestion.Treatments tried: allegra. The treatment provided mild relief.      Review of Systems  Constitutional:  Negative for chills.  HENT:  Positive for congestion, sinus pressure and sneezing. Negative for sore throat. Ear pain: ear congestion.  Respiratory:  Positive for cough. Negative for shortness of breath.   Neurological:  Positive for headaches.  All other systems reviewed and are negative.      Objective:   Physical Exam Vitals reviewed.  Constitutional:      General: He is not in acute distress.    Appearance: He is well-developed.  HENT:     Head: Normocephalic.     Right Ear: Tympanic membrane normal.     Left Ear: Tympanic membrane normal.     Mouth/Throat:     Pharynx: Pharyngeal swelling and posterior oropharyngeal erythema present.  Eyes:     General:        Right eye: No discharge.        Left eye: No discharge.     Pupils: Pupils are equal, round, and reactive to light.  Neck:     Thyroid: No thyromegaly.  Cardiovascular:     Rate and Rhythm: Normal rate and regular rhythm.     Heart sounds: Normal heart sounds. No murmur heard. Pulmonary:     Effort: Pulmonary effort is normal. No respiratory distress.     Breath sounds: Normal breath sounds. No  wheezing.  Abdominal:     General: Bowel sounds are normal. There is no distension.     Palpations: Abdomen is soft.     Tenderness: There is no abdominal tenderness.  Musculoskeletal:        General: No tenderness. Normal range of motion.     Cervical back: Normal range of motion and neck supple.  Skin:    General: Skin is warm and dry.     Findings: No erythema or rash.  Neurological:     Mental Status: He is alert and oriented to person, place, and time.     Cranial Nerves: No cranial nerve deficit.     Deep Tendon Reflexes: Reflexes are normal and symmetric.  Psychiatric:        Behavior: Behavior normal.        Thought Content: Thought content normal.        Judgment: Judgment normal.      BP (!) 152/78   Pulse 82   Temp (!) 97.2 F (36.2 C) (Temporal)   Ht '5\' 9"'$  (1.753 m)   Wt 190 lb (86.2 kg)   SpO2 98%   BMI 28.06 kg/m      Assessment & Plan:  Brett Blankenship comes in today with chief complaint of URI (Since before December seen stacks/)  Diagnosis and orders addressed:  1. Acute sinusitis, recurrence not specified, unspecified location - Take meds as prescribed - Use a cool mist humidifier  -Use saline nose sprays frequently -Force fluids -For any cough or congestion  Use plain Mucinex- regular strength or max strength is fine -For fever or aces or pains- take tylenol or ibuprofen. -Throat lozenges if help -Follow up if symptoms worsen or do not improve  - doxycycline (VIBRA-TABS) 100 MG tablet; Take 1 tablet (100 mg total) by mouth 2 (two) times daily.  Dispense: 20 tablet; Refill: 0   Evelina Dun, FNP

## 2022-05-17 NOTE — Patient Instructions (Signed)

## 2022-05-23 ENCOUNTER — Ambulatory Visit (INDEPENDENT_AMBULATORY_CARE_PROVIDER_SITE_OTHER): Payer: Medicare Other | Admitting: Family Medicine

## 2022-05-23 ENCOUNTER — Encounter: Payer: Self-pay | Admitting: Family Medicine

## 2022-05-23 VITALS — BP 128/61 | HR 85 | Temp 98.1°F | Ht 69.0 in | Wt 182.4 lb

## 2022-05-23 DIAGNOSIS — R5383 Other fatigue: Secondary | ICD-10-CM | POA: Diagnosis not present

## 2022-05-23 DIAGNOSIS — J019 Acute sinusitis, unspecified: Secondary | ICD-10-CM | POA: Diagnosis not present

## 2022-05-23 NOTE — Progress Notes (Signed)
Acute Office Visit  Subjective:     Patient ID: Brett Blankenship, male    DOB: 10-Aug-1926, 89 y.o.   MRN: [id number]  Chief Complaint  Patient presents with   Fatigue    HPI Here with son today. Patient is in today for fatigue. He reports that this has been ongoing for a few months now and worsened over the last few weeks since developing a URI. He was seen on 05/17/22 for acute sinusitis and was prescribed doxycyline. He reports cough and congestion has improved some since starting this but fatigue has not. He denies fever, chills, shortness of breath, wheezing, sore throat, chest pain, nausea, or vomiting. He denies palpitations, orthopnea, visual disturbances, headache. He does have some chronic lower leg swelling but reports that this is not worse than usual for him. He has not been staying well hydrated lately. He had labs done a few weeks ago. He is mildly anemic, but stable. He has not been taking allegra.   ROS As per HPI.      Objective:    BP 128/61   Pulse 85   Temp 98.1 F (36.7 C) (Temporal)   Ht '5\' 9"'$  (1.753 m)   Wt 182 lb 6 oz (82.7 kg)   SpO2 97%   BMI 26.93 kg/m    Physical Exam Vitals and nursing note reviewed.  Constitutional:      General: He is not in acute distress.    Appearance: He is not ill-appearing, toxic-appearing or diaphoretic.  HENT:     Head: Normocephalic and atraumatic.     Right Ear: Tympanic membrane, ear canal and external ear normal.     Left Ear: Tympanic membrane, ear canal and external ear normal.     Nose: Congestion present.     Mouth/Throat:     Mouth: Mucous membranes are moist.     Pharynx: Oropharynx is clear. No oropharyngeal exudate or posterior oropharyngeal erythema.  Eyes:     General: No scleral icterus.       Right eye: No discharge.        Left eye: No discharge.     Pupils: Pupils are equal, round, and reactive to light.  Cardiovascular:     Rate and Rhythm: Normal rate and regular rhythm.     Heart sounds:  Normal heart sounds. No murmur heard. Pulmonary:     Effort: Pulmonary effort is normal. No respiratory distress.     Breath sounds: Normal breath sounds.  Abdominal:     General: Bowel sounds are normal. There is no distension.     Palpations: Abdomen is soft.     Tenderness: There is no abdominal tenderness. There is no guarding or rebound.  Musculoskeletal:     Cervical back: Neck supple. No rigidity.     Right lower leg: 1+ Edema present.     Left lower leg: 1+ Edema present.  Lymphadenopathy:     Cervical: No cervical adenopathy.  Skin:    General: Skin is warm and dry.  Neurological:     General: No focal deficit present.     Mental Status: He is alert and oriented to person, place, and time.     Gait: Gait abnormal (using cane).  Psychiatric:        Mood and Affect: Mood normal.        Behavior: Behavior normal.     No results found for any visits on 05/23/22.      Assessment & Plan:  Inmer was seen today for fatigue.  Diagnoses and all orders for this visit:  Acute sinusitis, recurrence not specified, unspecified location Completed doxycycline a prescribed. Restart allegra, can also use flonase OTC. Discussed hydration to thin mucus. Lungs clear on exam. Non-toxic appearing.   Other fatigue Discussed fatigue can linger for awhile after illness. Discussed hydration and food intake.   Return if symptoms worsen or fail to improve.  The patient indicates understanding of these issues and agrees with the plan.  Gwenlyn Perking, FNP

## 2022-05-25 ENCOUNTER — Other Ambulatory Visit: Payer: Self-pay | Admitting: Family

## 2022-05-25 DIAGNOSIS — J019 Acute sinusitis, unspecified: Secondary | ICD-10-CM

## 2022-05-27 ENCOUNTER — Emergency Department (HOSPITAL_COMMUNITY): Payer: Medicare Other

## 2022-05-27 ENCOUNTER — Other Ambulatory Visit: Payer: Self-pay

## 2022-05-27 ENCOUNTER — Telehealth: Payer: Medicare Other | Admitting: Family Medicine

## 2022-05-27 ENCOUNTER — Encounter (HOSPITAL_COMMUNITY): Payer: Self-pay | Admitting: Emergency Medicine

## 2022-05-27 ENCOUNTER — Inpatient Hospital Stay (HOSPITAL_COMMUNITY)
Admission: EM | Admit: 2022-05-27 | Discharge: 2022-05-30 | DRG: 291 | Disposition: A | Discharge status: Home Health | Payer: Medicare Other | Attending: Internal Medicine | Admitting: Internal Medicine

## 2022-05-27 DIAGNOSIS — N1832 Chronic kidney disease, stage 3b: Secondary | ICD-10-CM | POA: Diagnosis not present

## 2022-05-27 DIAGNOSIS — E860 Dehydration: Secondary | ICD-10-CM | POA: Diagnosis not present

## 2022-05-27 DIAGNOSIS — I130 Hypertensive heart and chronic kidney disease with heart failure and stage 1 through stage 4 chronic kidney disease, or unspecified chronic kidney disease: Principal | ICD-10-CM | POA: Diagnosis present

## 2022-05-27 DIAGNOSIS — Z8249 Family history of ischemic heart disease and other diseases of the circulatory system: Secondary | ICD-10-CM | POA: Diagnosis not present

## 2022-05-27 DIAGNOSIS — R531 Weakness: Secondary | ICD-10-CM | POA: Diagnosis not present

## 2022-05-27 DIAGNOSIS — I4819 Other persistent atrial fibrillation: Secondary | ICD-10-CM

## 2022-05-27 DIAGNOSIS — Z79899 Other long term (current) drug therapy: Secondary | ICD-10-CM | POA: Diagnosis not present

## 2022-05-27 DIAGNOSIS — R7989 Other specified abnormal findings of blood chemistry: Secondary | ICD-10-CM | POA: Diagnosis not present

## 2022-05-27 DIAGNOSIS — E876 Hypokalemia: Secondary | ICD-10-CM | POA: Diagnosis present

## 2022-05-27 DIAGNOSIS — I480 Paroxysmal atrial fibrillation: Secondary | ICD-10-CM | POA: Diagnosis not present

## 2022-05-27 DIAGNOSIS — I2489 Other forms of acute ischemic heart disease: Secondary | ICD-10-CM | POA: Diagnosis present

## 2022-05-27 DIAGNOSIS — Z800 Family history of malignant neoplasm of digestive organs: Secondary | ICD-10-CM

## 2022-05-27 DIAGNOSIS — Z87891 Personal history of nicotine dependence: Secondary | ICD-10-CM | POA: Diagnosis not present

## 2022-05-27 DIAGNOSIS — Z801 Family history of malignant neoplasm of trachea, bronchus and lung: Secondary | ICD-10-CM | POA: Diagnosis not present

## 2022-05-27 DIAGNOSIS — I350 Nonrheumatic aortic (valve) stenosis: Secondary | ICD-10-CM | POA: Diagnosis not present

## 2022-05-27 DIAGNOSIS — I509 Heart failure, unspecified: Secondary | ICD-10-CM

## 2022-05-27 DIAGNOSIS — I77810 Thoracic aortic ectasia: Secondary | ICD-10-CM | POA: Diagnosis present

## 2022-05-27 DIAGNOSIS — E782 Mixed hyperlipidemia: Secondary | ICD-10-CM | POA: Diagnosis not present

## 2022-05-27 DIAGNOSIS — I110 Hypertensive heart disease with heart failure: Secondary | ICD-10-CM | POA: Diagnosis not present

## 2022-05-27 DIAGNOSIS — Z66 Do not resuscitate: Secondary | ICD-10-CM | POA: Diagnosis not present

## 2022-05-27 DIAGNOSIS — I5031 Acute diastolic (congestive) heart failure: Secondary | ICD-10-CM | POA: Diagnosis not present

## 2022-05-27 DIAGNOSIS — Z1152 Encounter for screening for COVID-19: Secondary | ICD-10-CM | POA: Diagnosis not present

## 2022-05-27 DIAGNOSIS — D631 Anemia in chronic kidney disease: Secondary | ICD-10-CM | POA: Diagnosis present

## 2022-05-27 DIAGNOSIS — I10 Essential (primary) hypertension: Secondary | ICD-10-CM

## 2022-05-27 DIAGNOSIS — R079 Chest pain, unspecified: Secondary | ICD-10-CM | POA: Diagnosis not present

## 2022-05-27 DIAGNOSIS — I351 Nonrheumatic aortic (valve) insufficiency: Secondary | ICD-10-CM | POA: Diagnosis present

## 2022-05-27 DIAGNOSIS — I4891 Unspecified atrial fibrillation: Secondary | ICD-10-CM

## 2022-05-27 DIAGNOSIS — I77819 Aortic ectasia, unspecified site: Secondary | ICD-10-CM | POA: Diagnosis not present

## 2022-05-27 DIAGNOSIS — D539 Nutritional anemia, unspecified: Secondary | ICD-10-CM

## 2022-05-27 LAB — TROPONIN I (HIGH SENSITIVITY)
Troponin I (High Sensitivity): 48 ng/L — ABNORMAL HIGH (ref <18)
Troponin I (High Sensitivity): 49 ng/L — ABNORMAL HIGH (ref <18)

## 2022-05-27 LAB — RESP PANEL BY RT-PCR (RSV, FLU A&B, COVID)  RVPGX2
Influenza A by PCR: NEGATIVE
Influenza B by PCR: NEGATIVE
Resp Syncytial Virus by PCR: NEGATIVE
SARS Coronavirus 2 by RT PCR: NEGATIVE

## 2022-05-27 LAB — CBC WITH DIFFERENTIAL/PLATELET
Abs Immature Granulocytes: 0.02 10*3/uL (ref 0.00–0.07)
Basophils Absolute: 0 10*3/uL (ref 0.0–0.1)
Basophils Relative: 1 %
Eosinophils Absolute: 0 10*3/uL (ref 0.0–0.5)
Eosinophils Relative: 1 %
HCT: 30.6 % — ABNORMAL LOW (ref 39.0–52.0)
Hemoglobin: 10.8 g/dL — ABNORMAL LOW (ref 13.0–17.0)
Immature Granulocytes: 1 %
Lymphocytes Relative: 33 %
Lymphs Abs: 1.4 10*3/uL (ref 0.7–4.0)
MCH: 36 pg — ABNORMAL HIGH (ref 26.0–34.0)
MCHC: 35.3 g/dL (ref 30.0–36.0)
MCV: 102 fL — ABNORMAL HIGH (ref 80.0–100.0)
Monocytes Absolute: 0.4 10*3/uL (ref 0.1–1.0)
Monocytes Relative: 10 %
Neutro Abs: 2.3 10*3/uL (ref 1.7–7.7)
Neutrophils Relative %: 54 %
Platelets: 185 10*3/uL (ref 150–400)
RBC: 3 MIL/uL — ABNORMAL LOW (ref 4.22–5.81)
RDW: 14.2 % (ref 11.5–15.5)
WBC: 4.1 10*3/uL (ref 4.0–10.5)
nRBC: 0 % (ref 0.0–0.2)

## 2022-05-27 LAB — URINALYSIS, ROUTINE W REFLEX MICROSCOPIC
Bilirubin Urine: NEGATIVE
Glucose, UA: NEGATIVE mg/dL
Hgb urine dipstick: NEGATIVE
Ketones, ur: NEGATIVE mg/dL
Leukocytes,Ua: NEGATIVE
Nitrite: NEGATIVE
Protein, ur: NEGATIVE mg/dL
Specific Gravity, Urine: 1.012 (ref 1.005–1.030)
pH: 6 (ref 5.0–8.0)

## 2022-05-27 LAB — COMPREHENSIVE METABOLIC PANEL
ALT: 19 U/L (ref 0–44)
AST: 30 U/L (ref 15–41)
Albumin: 2.4 g/dL — ABNORMAL LOW (ref 3.5–5.0)
Alkaline Phosphatase: 40 U/L (ref 38–126)
Anion gap: 7 (ref 5–15)
BUN: 35 mg/dL — ABNORMAL HIGH (ref 8–23)
CO2: 25 mmol/L (ref 22–32)
Calcium: 9.3 mg/dL (ref 8.9–10.3)
Chloride: 102 mmol/L (ref 98–111)
Creatinine, Ser: 1.39 mg/dL — ABNORMAL HIGH (ref 0.61–1.24)
GFR, Estimated: 47 mL/min — ABNORMAL LOW (ref >60)
Glucose, Bld: 86 mg/dL (ref 70–99)
Potassium: 3.6 mmol/L (ref 3.5–5.1)
Sodium: 134 mmol/L — ABNORMAL LOW (ref 135–145)
Total Bilirubin: 1.4 mg/dL — ABNORMAL HIGH (ref 0.3–1.2)
Total Protein: 6.2 g/dL — ABNORMAL LOW (ref 6.5–8.1)

## 2022-05-27 LAB — RESPIRATORY PANEL BY PCR

## 2022-05-27 LAB — IRON AND TIBC
Iron: 112 ug/dL (ref 45–182)
Saturation Ratios: 45 % — ABNORMAL HIGH (ref 17.9–39.5)
TIBC: 249 ug/dL — ABNORMAL LOW (ref 250–450)
UIBC: 137 ug/dL

## 2022-05-27 LAB — VITAMIN B12: Vitamin B-12: 851 pg/mL (ref 180–914)

## 2022-05-27 LAB — FERRITIN: Ferritin: 477 ng/mL — ABNORMAL HIGH (ref 24–336)

## 2022-05-27 LAB — FOLATE: Folate: 9.6 ng/mL (ref >5.9)

## 2022-05-27 LAB — BRAIN NATRIURETIC PEPTIDE: B Natriuretic Peptide: 669 pg/mL — ABNORMAL HIGH (ref 0.0–100.0)

## 2022-05-27 LAB — T4, FREE: Free T4: 1.84 ng/dL — ABNORMAL HIGH (ref 0.61–1.12)

## 2022-05-27 LAB — TSH: TSH: 1.18 u[IU]/mL (ref 0.350–4.500)

## 2022-05-27 LAB — MAGNESIUM: Magnesium: 1.8 mg/dL (ref 1.7–2.4)

## 2022-05-27 MED ORDER — FUROSEMIDE 10 MG/ML IJ SOLN
40.0000 mg | Freq: Once | INTRAMUSCULAR | Status: AC
  Administered 2022-05-27: 40 mg via INTRAVENOUS
  Filled 2022-05-27: qty 4

## 2022-05-27 MED ORDER — SODIUM CHLORIDE 0.9% FLUSH: 3.0000 mL | INTRAVENOUS | Status: DC | PRN

## 2022-05-27 MED ORDER — APIXABAN 5 MG PO TABS
5.0000 mg | ORAL_TABLET | Freq: Two times a day (BID) | ORAL | Status: DC
  Administered 2022-05-27 – 2022-05-30 (×6): 5 mg via ORAL
  Filled 2022-05-27 (×6): qty 1

## 2022-05-27 MED ORDER — SODIUM CHLORIDE 0.9 % IV SOLN: 250.0000 mL | INTRAVENOUS | Status: DC | PRN

## 2022-05-27 MED ORDER — ACETAMINOPHEN 650 MG RE SUPP: 650.0000 mg | Freq: Four times a day (QID) | RECTAL | Status: DC | PRN

## 2022-05-27 MED ORDER — SODIUM CHLORIDE 0.9% FLUSH
3.0000 mL | Freq: Two times a day (BID) | INTRAVENOUS | Status: DC
  Administered 2022-05-27 – 2022-05-30 (×6): 3 mL via INTRAVENOUS

## 2022-05-27 MED ORDER — ACETAMINOPHEN 325 MG PO TABS: 650.0000 mg | ORAL_TABLET | Freq: Four times a day (QID) | ORAL | Status: DC | PRN

## 2022-05-27 MED ORDER — METOPROLOL TARTRATE 25 MG PO TABS
12.5000 mg | ORAL_TABLET | Freq: Two times a day (BID) | ORAL | Status: DC
  Administered 2022-05-27 – 2022-05-29 (×3): 12.5 mg via ORAL
  Filled 2022-05-27 (×4): qty 1

## 2022-05-27 MED ORDER — FUROSEMIDE 10 MG/ML IJ SOLN
40.0000 mg | Freq: Every day | INTRAMUSCULAR | Status: DC
  Administered 2022-05-28 – 2022-05-29 (×2): 40 mg via INTRAVENOUS
  Filled 2022-05-27 (×2): qty 4

## 2022-05-27 NOTE — Assessment & Plan Note (Signed)
Baseline hgb around 12.0 Check B12/folate and iron studies  Trend

## 2022-05-27 NOTE — Assessment & Plan Note (Signed)
Blood pressures soft  Hold his norvasc since starting metoprolol

## 2022-05-27 NOTE — Assessment & Plan Note (Signed)
New finding of atrial fibrillation in setting of possible Chf exacerbation and recent illness Has been feeling bad for a few months Rate controlled Had discussion about AC. CHA2DS2-VASC score of 4. Discussed risks of stroke with atrial fibrillation and risk of bleeding elliquis. Son states his dad doesn't fall and they are both okay with starting this  Start low dose metoprolol  Check TSH/Mag Check echo

## 2022-05-27 NOTE — ED Notes (Signed)
See triage notes. Nad. A/o. No resp distress or sob noted.

## 2022-05-27 NOTE — Assessment & Plan Note (Signed)
Likely secondary to demand ischemia in setting of atrial fib/Chf exacerbation  Troponin flat with no chest pain no characteristic of ACS Continue to monitor on telemetry

## 2022-05-27 NOTE — Discharge Instructions (Signed)

## 2022-05-27 NOTE — Progress Notes (Unsigned)
   Virtual Visit via telephone Note  I connected with Brett Blankenship on 05/27/22 at *** by telephone and verified that I am speaking with the correct person using two identifiers. Brett Blankenship is currently located at home and {family members:[phone]} are currently with her during visit. The provider, Fransisca Kaufmann Rayder Sullenger, MD is located in their office at time of visit.  Call ended at ***  I discussed the limitations, risks, security and privacy concerns of performing an evaluation and management service by telephone and the availability of in person appointments. I also discussed with the patient that there may be a patient responsible charge related to this service. The patient expressed understanding and agreed to proceed.   History and Present Illness:   No diagnosis found.  Outpatient Encounter Medications as of 05/27/2022  Medication Sig   amLODipine (NORVASC) 10 MG tablet TAKE (1) TABLET BY MOUTH ONCE DAILY.   doxycycline (VIBRA-TABS) 100 MG tablet Take 1 tablet (100 mg total) by mouth 2 (two) times daily.   fenofibrate 160 MG tablet TAKE 1 TABLET BY MOUTH ONCE A DAY.   fexofenadine (ALLEGRA) 180 MG tablet Take 1 tablet (180 mg total) by mouth daily. For allergy symptoms   fish oil-omega-3 fatty acids 1000 MG capsule Take 2 g by mouth daily.   Multiple Vitamins-Minerals (EYE VITAMINS & MINERALS PO) Take by mouth. [facility]   nabumetone (RELAFEN) 500 MG tablet TAKE 2 TABLETS BY MOUTH TWICE DAILY AS NEEDED FOR PAIN.   nitroGLYCERIN (NITROSTAT) 0.3 MG SL tablet Place 1 tablet (0.3 mg total) under the tongue every 5 (five) minutes as needed for chest pain.   prednisoLONE acetate (PRED FORTE) 1 % ophthalmic suspension PLACE 1 DROP INTO THE LEFT EYE 4 TIMES DAILY.   triamterene-hydrochlorothiazide (MAXZIDE-25) 37.5-25 MG tablet TAKE ONE TABLET BY MOUTH ONCE DAILY FOR BLOOD PRESSURE AND FLUID.   Vitamin D, Ergocalciferol, 50000 units CAPS Take 1 capsule (50,000 Units total) by mouth  every 7 (seven) days.   No facility-administered encounter medications on file as of 05/27/2022.    Review of Systems  Observations/Objective:   Assessment and Plan: Problem List Items Addressed This Visit   None    Follow up plan: No follow-ups on file.     I discussed the assessment and treatment plan with the patient. The patient was provided an opportunity to ask questions and all were answered. The patient agreed with the plan and demonstrated an understanding of the instructions.   The patient was advised to call back or seek an in-person evaluation if the symptoms worsen or if the condition fails to improve as anticipated.  The above assessment and management plan was discussed with the patient. The patient verbalized understanding of and has agreed to the management plan. Patient is aware to call the clinic if symptoms persist or worsen. Patient is aware when to return to the clinic for a follow-up visit. Patient educated on when it is appropriate to go to the emergency department.    I provided *** minutes of non-face-to-face time during this encounter.    Worthy Rancher, MD

## 2022-05-27 NOTE — ED Triage Notes (Signed)
Pt has been seen for cough, congestion, nasal drainage x 2-3 weeks, reports he has been seen and given doxycycline and completed with no improvement

## 2022-05-27 NOTE — H&P (Signed)
History and Physical    Patient: Brett Blankenship DOB: 1926-07-21 DOA: 05/27/2022 DOS: the patient was seen and examined on 05/27/2022 PCP: Claretta Fraise, MD  Patient coming from: Home - lives with his son. Uses a cane to ambulate.    Chief Complaint: cough/fatigue   HPI: Brett Blankenship is a 89 y.o. male with medical history significant of HTN, HLD, anemia, CKD stage 3b who presented to ED with complaints of cough/congestion. He was given a course of doxycycline on 05/17/22 with no improvement.  He has been complaining of generalized fatigue as well. He denies any shortness of breath. He has had leg swelling x 2 years, worse on the left. He denies orthopnea or dyspnea on exertion. He denies any chest pain or palpitations. He has gained some weight, 2-3 pounds.   He states his symptoms started about 2 months ago with fatigue and decreased PO intake. His son helps with history.  3-4 weeks ago he got out of bed and fell trying to do a step up into another room. He had a rib fracture from this. His son was then sick with a fever and cough.  He then got a sinus infection and was treated.  He went back last week to see his physician and they didn't think he was any worse.  He continued to feel weak, mild cough and had a video visit today. They decided on their own to come here.    Denies any fever/chills, vision changes/headaches, chest pain or palpitations, shortness of breath or cough, abdominal pain, N/V/D, dysuria.    He does not smoke or drink alcohol.   ER Course:  vitals: afebrile, bp: 107/59, HR: 90, RR: 20, oxygen: 100%RA Pertinent labs: hgb: 10.8, MCV: 102, BUN: 35, creatinine: 1.39, BNP: 669,  Troponin 48> pending  covid/flu/RSV negative.  CXR: no acute process In ED: given '40mg'$  of IV lasix. TRH asked to admit.    Review of Systems: As mentioned in the history of present illness. All other systems reviewed and are negative. Past Medical History:  Diagnosis Date    HYPERTENSION 07/12/2009   Past Surgical History:  Procedure Laterality Date   APPENDECTOMY  1960   NECK SURGERY     Social History:  reports that he quit smoking about 58 years ago. His smoking use included cigarettes. He has a 1.50 pack-year smoking history. He has never used smokeless tobacco. He reports that he does not currently use alcohol. He reports that he does not currently use drugs.  No Known Allergies  Family History  Problem Relation Age of Onset   Hypertension Father    Heart disease Father    Cancer Brother        colon, lung    Prior to Admission medications   Medication Sig Start Date End Date Taking? Authorizing Provider  amLODipine (NORVASC) 10 MG tablet TAKE (1) TABLET BY MOUTH ONCE DAILY. 04/15/22   Claretta Fraise, MD  doxycycline (VIBRA-TABS) 100 MG tablet Take 1 tablet (100 mg total) by mouth 2 (two) times daily. 05/17/22   Evelina Dun A, FNP  fenofibrate 160 MG tablet TAKE 1 TABLET BY MOUTH ONCE A DAY. 11/05/21   Claretta Fraise, MD  fexofenadine (ALLEGRA) 180 MG tablet Take 1 tablet (180 mg total) by mouth daily. For allergy symptoms 04/18/22   Claretta Fraise, MD  fish oil-omega-3 fatty acids 1000 MG capsule Take 2 g by mouth daily.    [provider]  Multiple Vitamins-Minerals (EYE VITAMINS &  MINERALS PO) Take by mouth. [facility]    [provider]  nabumetone (RELAFEN) 500 MG tablet TAKE 2 TABLETS BY MOUTH TWICE DAILY AS NEEDED FOR PAIN. 04/08/22   Claretta Fraise, MD  nitroGLYCERIN (NITROSTAT) 0.3 MG SL tablet Place 1 tablet (0.3 mg total) under the tongue every 5 (five) minutes as needed for chest pain. 03/07/21   Claretta Fraise, MD  prednisoLONE acetate (PRED FORTE) 1 % ophthalmic suspension PLACE 1 DROP INTO THE LEFT EYE 4 TIMES DAILY. 10/12/19   Claretta Fraise, MD  triamterene-hydrochlorothiazide (MAXZIDE-25) 37.5-25 MG tablet TAKE ONE TABLET BY MOUTH ONCE DAILY FOR BLOOD PRESSURE AND FLUID. 09/04/21   Claretta Fraise, MD  Vitamin D,  Ergocalciferol, 50000 units CAPS Take 1 capsule (50,000 Units total) by mouth every 7 (seven) days. 03/05/22   Claretta Fraise, MD    Physical Exam: Vitals:   05/27/22 1630 05/27/22 1700 05/27/22 1819 05/27/22 2103  BP:  125/62 (!) 109/43 127/63  Pulse: 71  70 70  Resp: '19 19 20 18  '$ Temp:   97.6 F (36.4 C) 97.8 F (36.6 C)  TempSrc:      SpO2: 100%  100% 99%  Weight:   (!) 169.4 kg   Height:   '5\' 9"'$  (1.753 m)    General:  Appears calm and comfortable and is in NAD Eyes:  PERRL, EOMI, normal lids, iris ENT:  HOH, lips & tongue, mmm; poor dentition Neck:  no LAD, masses or thyromegaly; no carotid bruits Cardiovascular:  Rate regular, rhythm irregular, +pitting BL edema.  Respiratory:   CTA bilaterally with no wheezes/rales/rhonchi.  Normal respiratory effort. Abdomen:  soft, NT, ND, NABS Back:   normal alignment, no CVAT Skin:  no rash or induration seen on limited exam Musculoskeletal:  grossly normal tone BUE/BLE, good ROM, no bony abnormality Lower extremity:   Limited foot exam with no ulcerations.  2+ distal pulses. Psychiatric:  grossly normal mood and affect, speech fluent and appropriate, AOx3 Neurologic:  CN 2-12 grossly intact, moves all extremities in coordinated fashion, sensation intact   Radiological Exams on Admission: Independently reviewed - see discussion in A/P where applicable  DG Chest 2 View  Result Date: 05/27/2022 CLINICAL DATA:  Several month history of bilateral chest pain EXAM: CHEST - 2 VIEW COMPARISON:  Chest radiograph dated 11/21/2017 FINDINGS: Normal lung volumes. No focal consolidations. No pleural effusion or pneumothorax. Annular calcification involving the presumed tricuspid valve. The visualized skeletal structures are unremarkable. IMPRESSION: No active cardiopulmonary disease. Electronically Signed   By: Darrin Nipper M.D.   On: 05/27/2022 11:15    EKG: Independently reviewed.  Atrial fibrillation with rate 73; nonspecific ST changes with no  evidence of acute ischemia   Labs on Admission: I have personally reviewed the available labs and imaging studies at the time of the admission.  Pertinent labs:   hgb: 10.8,  MCV: 102,  BUN: 35,  creatinine: 1.39,  BNP: 669,  Troponin 48> pending   covid/flu/RSV negative.   Assessment and Plan: Principal Problem:   Acute exacerbation of CHF (congestive heart failure) (HCC) Active Problems:   Paroxysmal atrial fibrillation (HCC)   Elevated troponin   Macrocytic anemia   Stage 3b chronic kidney disease (CKD) (HCC)   Essential hypertension   Mixed hyperlipidemia    Assessment and Plan: * Acute exacerbation of CHF (congestive heart failure) ([place]) 89 year old presenting with complaints of fatigue, cough and poor PO intake found to be in new rate controlled atrial fibrillation and new CHF  exacerbation  -LE edema, DOE, elevated BNP support CHF exacerbation. ? If due to atrial fib  -check echo -given 1 dose of IV lasix in Ed, monitor response -continue '40mg'$  daily  -strict I/o and daily weights -echo   Paroxysmal atrial fibrillation (HCC) New finding of atrial fibrillation in setting of possible Chf exacerbation and recent illness Has been feeling bad for a few months Rate controlled Had discussion about AC. CHA2DS2-VASC score of 4. Discussed risks of stroke with atrial fibrillation and risk of bleeding elliquis. Son states his dad doesn't fall and they are both okay with starting this  Start low dose metoprolol  Check TSH/Mag Check echo   Elevated troponin Likely secondary to demand ischemia in setting of atrial fib/Chf exacerbation  Troponin flat with no chest pain no characteristic of ACS Continue to monitor on telemetry  Macrocytic anemia Baseline hgb around 12.0 Check B12/folate and iron studies  Trend   Stage 3b chronic kidney disease (CKD) (HCC) Baseline creatinine around 1.4, stable. Monitor with diuresis   Essential hypertension Blood pressures soft  Hold  his norvasc since starting metoprolol   Mixed hyperlipidemia Continue fenofibrate     Advance Care Planning:   Code Status: DNR   Consults: none   DVT Prophylaxis: eliquis per pharmacy dosing   Family Communication: son at bedside   Severity of Illness: The appropriate patient status for this patient is OBSERVATION. Observation status is judged to be reasonable and necessary in order to provide the required intensity of service to ensure the patient's safety. The patient's presenting symptoms, physical exam findings, and initial radiographic and laboratory data in the context of their medical condition is felt to place them at decreased risk for further clinical deterioration. Furthermore, it is anticipated that the patient will be medically stable for discharge from the hospital within 2 midnights of admission.   Author: Orma Flaming, MD 05/27/2022 9:05 PM  For on call review [email or web address].

## 2022-05-27 NOTE — ED Notes (Signed)
Dr Rogers Blocker at bedside and EKG given to her.

## 2022-05-27 NOTE — Assessment & Plan Note (Signed)
Continue fenofibrate 

## 2022-05-27 NOTE — Assessment & Plan Note (Signed)
Baseline creatinine around 1.4, stable. Monitor with diuresis

## 2022-05-27 NOTE — ED Provider Notes (Signed)
[facility] Provider Note   CSN: [id number] Arrival date & time: 05/27/22  1024     History  Chief Complaint  Patient presents with   Cough    Brett Blankenship is a 89 y.o. male with a history including hypertension, hyperlipidemia and chronic mild anemia presenting with generalized fatigue along with a cough productive of clear sputum with occasional flecks of blood, clear nasal drainage and congestion which has been not responded to recent antibiotics, stating he completed a course of doxycycline this week, prescribed by his pcp.  Additionally states he has had no energy,  feels weak,  having fear of ambulating due to weakness and is afraid he will fall.  He reports poor appetite over the past month and endorses subjective weight loss.  He denies chest pain, palpitations, sob, also no abdominal pain, n/v or diarrhea.    The history is provided by the patient and a relative (son at bedside).       Home Medications Prior to Admission medications   Medication Sig Start Date End Date Taking? Authorizing Provider  amLODipine (NORVASC) 10 MG tablet TAKE (1) TABLET BY MOUTH ONCE DAILY. 04/15/22   Claretta Fraise, MD  doxycycline (VIBRA-TABS) 100 MG tablet Take 1 tablet (100 mg total) by mouth 2 (two) times daily. 05/17/22   Evelina Dun A, FNP  fenofibrate 160 MG tablet TAKE 1 TABLET BY MOUTH ONCE A DAY. 11/05/21   Claretta Fraise, MD  fexofenadine (ALLEGRA) 180 MG tablet Take 1 tablet (180 mg total) by mouth daily. For allergy symptoms 04/18/22   Claretta Fraise, MD  fish oil-omega-3 fatty acids 1000 MG capsule Take 2 g by mouth daily.    [provider]  Multiple Vitamins-Minerals (EYE VITAMINS & MINERALS PO) Take by mouth. [facility]    [provider]  nabumetone (RELAFEN) 500 MG tablet TAKE 2 TABLETS BY MOUTH TWICE DAILY AS NEEDED FOR PAIN. 04/08/22   Claretta Fraise, MD  nitroGLYCERIN (NITROSTAT) 0.3 MG SL tablet Place 1  tablet (0.3 mg total) under the tongue every 5 (five) minutes as needed for chest pain. 03/07/21   Claretta Fraise, MD  prednisoLONE acetate (PRED FORTE) 1 % ophthalmic suspension PLACE 1 DROP INTO THE LEFT EYE 4 TIMES DAILY. 10/12/19   Claretta Fraise, MD  triamterene-hydrochlorothiazide (MAXZIDE-25) 37.5-25 MG tablet TAKE ONE TABLET BY MOUTH ONCE DAILY FOR BLOOD PRESSURE AND FLUID. 09/04/21   Claretta Fraise, MD  Vitamin D, Ergocalciferol, 50000 units CAPS Take 1 capsule (50,000 Units total) by mouth every 7 (seven) days. 03/05/22   Claretta Fraise, MD      Allergies    Patient has no known allergies.    Review of Systems   Review of Systems  Constitutional:  Positive for fatigue. Negative for fever.  HENT:  Positive for congestion and rhinorrhea. Negative for sore throat.   Eyes: Negative.   Respiratory:  Positive for cough. Negative for chest tightness and shortness of breath.   Cardiovascular:  Positive for leg swelling. Negative for chest pain.       Chronic lower extremity edema, unchanged.  Gastrointestinal:  Negative for abdominal pain and nausea.  Genitourinary: Negative.   Musculoskeletal:  Negative for arthralgias, joint swelling and neck pain.  Skin: Negative.  Negative for rash and wound.  Neurological:  Positive for weakness. Negative for dizziness, light-headedness, numbness and headaches.  Psychiatric/Behavioral: Negative.      Physical Exam Updated Vital Signs BP 126/61   Pulse 63  Temp 97.8 F (36.6 C) (Oral)   Resp 16   Ht '5\' 9"'$  (1.753 m)   Wt 82.7 kg   SpO2 95%   BMI 26.93 kg/m  Physical Exam Vitals and nursing note reviewed.  Constitutional:      Appearance: He is well-developed.  HENT:     Head: Normocephalic and atraumatic.  Eyes:     Conjunctiva/sclera: Conjunctivae normal.  Cardiovascular:     Rate and Rhythm: Normal rate. Rhythm irregular.     Heart sounds: Normal heart sounds.  Pulmonary:     Effort: Pulmonary effort is normal.     Breath sounds:  Rales present. No wheezing or rhonchi.  Abdominal:     General: Bowel sounds are normal.     Palpations: Abdomen is soft.     Tenderness: There is no abdominal tenderness. There is no guarding or rebound.  Musculoskeletal:        General: Normal range of motion.     Cervical back: Normal range of motion.     Right lower leg: Edema present.     Left lower leg: Edema present.     Comments: Bilateral edema to upper tibias.  Skin:    General: Skin is warm and dry.  Neurological:     Mental Status: He is alert.     ED Results / Procedures / Treatments   Labs (all labs ordered are listed, but only abnormal results are displayed) Labs Reviewed  CBC WITH DIFFERENTIAL/PLATELET - Abnormal; Notable for the following components:      Result Value   RBC 3.00 (*)    Hemoglobin 10.8 (*)    HCT 30.6 (*)    MCV 102.0 (*)    MCH 36.0 (*)    All other components within normal limits  COMPREHENSIVE METABOLIC PANEL - Abnormal; Notable for the following components:   Sodium 134 (*)    BUN 35 (*)    Creatinine, Ser 1.39 (*)    Total Protein 6.2 (*)    Albumin 2.4 (*)    Total Bilirubin 1.4 (*)    GFR, Estimated 47 (*)    All other components within normal limits  BRAIN NATRIURETIC PEPTIDE - Abnormal; Notable for the following components:   B Natriuretic Peptide 669.0 (*)    All other components within normal limits  TROPONIN I (HIGH SENSITIVITY) - Abnormal; Notable for the following components:   Troponin I (High Sensitivity) 48 (*)    All other components within normal limits  RESP PANEL BY RT-PCR (RSV, FLU A&B, COVID)  RVPGX2  RESPIRATORY PANEL BY PCR  URINALYSIS, ROUTINE W REFLEX MICROSCOPIC  MAGNESIUM  TSH  T4, FREE  FERRITIN  IRON AND TIBC  VITAMIN B12  FOLATE    EKG None  Radiology DG Chest 2 View  Result Date: 05/27/2022 CLINICAL DATA:  Several month history of bilateral chest pain EXAM: CHEST - 2 VIEW COMPARISON:  Chest radiograph dated 11/21/2017 FINDINGS: Normal lung  volumes. No focal consolidations. No pleural effusion or pneumothorax. Annular calcification involving the presumed tricuspid valve. The visualized skeletal structures are unremarkable. IMPRESSION: No active cardiopulmonary disease. Electronically Signed   By: Darrin Nipper M.D.   On: 05/27/2022 11:15    Procedures Procedures    Medications Ordered in ED Medications  furosemide (LASIX) injection 40 mg (40 mg Intravenous Given 05/27/22 1504)    ED Course/ Medical Decision Making/ A&P  Medical Decision Making Patient presenting with a cough congestion and, increasing weakness and generalized fatigue.  Underwent a course of antibiotics without improvement.  His exam today suggests CHF as he has significant bilateral rales and lower extremity edema.  His EKG also reflects atrial fibrillation, this is rate controlled but is a new finding.  He has significant generalized weakness, also describes poor appetite which has been present for greater than 1 month.  New onset chf - lasix ordered.    Amount and/or Complexity of Data Reviewed Labs: ordered.    Details: Labs are significant for elevated troponin at 48, delta troponin is currently pending.  He also has some mild dehydration, creatinine is 1.39 which appears baseline, his BUN is 35, somewhat elevated.  He has an elevated BNP at 669, he does not have a prior history of CHF, respiratory panel is negative for acute viral infection specifically COVID, influenza and RSV. Radiology: ordered. Discussion of management or test interpretation with external provider(s): Pt discussed with Dr. Rogers Blocker who accepts pt for admission.  Risk Decision regarding hospitalization.           Final Clinical Impression(s) / ED Diagnoses Final diagnoses:  Atrial fibrillation, unspecified type ([place])  Acute congestive heart failure, unspecified heart failure type [facility])    Rx / DC Orders ED Discharge Orders     None          Landis Martins 05/27/22 1536    Milton Ferguson, MD 05/28/22 1027

## 2022-05-27 NOTE — Plan of Care (Signed)
  Problem: Education: Goal: Knowledge of General Education information will improve Description: Including pain rating scale, medication(s)/side effects and non-pharmacologic comfort measures Outcome: Progressing   Problem: Health Behavior/Discharge Planning: Goal: Ability to manage health-related needs will improve Outcome: Progressing

## 2022-05-27 NOTE — Progress Notes (Signed)
ANTICOAGULATION CONSULT NOTE - Initial Consult  Pharmacy Consult for apixaban  Indication: atrial fibrillation  No Known Allergies  Patient Measurements: Height: '5\' 9"'$  (175.3 cm) Weight: 82.7 kg (182 lb 6 oz) IBW/kg (Calculated) : 70.7  Vital Signs: Temp: 97.8 F (36.6 C) (01/29 1044) Temp Source: Oral (01/29 1044) BP: 126/61 (01/29 1500) Pulse Rate: 71 (01/29 1545)  Labs: Recent Labs    05/27/22 1256  HGB 10.8*  HCT 30.6*  PLT 185  CREATININE 1.39*  TROPONINIHS 48*    Estimated Creatinine Clearance: 33.2 mL/min (A) (by C-G formula based on SCr of 1.39 mg/dL (H)).   Medical History: Past Medical History:  Diagnosis Date   HYPERTENSION 07/12/2009     Assessment: 89 year old male with new afib to be started on apixaban. Patient does not appear to be on anticoagulants prior to admission. He does not meed requirements for dose adjustments with only criteria met is age>80. Hgb 10.8 and plt 185 on admit.   Goal of Therapy:  Monitor platelets by anticoagulation protocol: Yes   Plan:  Apixaban '5mg'$  bid  Erin Hearing PharmD., BCPS Clinical Pharmacist 05/27/2022 4:40 PM

## 2022-05-27 NOTE — Assessment & Plan Note (Signed)
89 year old presenting with complaints of fatigue, cough and poor PO intake found to be in new rate controlled atrial fibrillation and new CHF exacerbation  -LE edema, DOE, elevated BNP support CHF exacerbation. ? If due to atrial fib  -check echo -given 1 dose of IV lasix in Ed, monitor response -continue '40mg'$  daily  -strict I/o and daily weights -echo

## 2022-05-28 ENCOUNTER — Observation Stay (HOSPITAL_COMMUNITY): Payer: Medicare Other

## 2022-05-28 ENCOUNTER — Other Ambulatory Visit (HOSPITAL_COMMUNITY): Payer: Self-pay | Admitting: *Deleted

## 2022-05-28 DIAGNOSIS — I2489 Other forms of acute ischemic heart disease: Secondary | ICD-10-CM | POA: Diagnosis present

## 2022-05-28 DIAGNOSIS — I5031 Acute diastolic (congestive) heart failure: Secondary | ICD-10-CM | POA: Diagnosis present

## 2022-05-28 DIAGNOSIS — I130 Hypertensive heart and chronic kidney disease with heart failure and stage 1 through stage 4 chronic kidney disease, or unspecified chronic kidney disease: Secondary | ICD-10-CM | POA: Diagnosis present

## 2022-05-28 DIAGNOSIS — D631 Anemia in chronic kidney disease: Secondary | ICD-10-CM | POA: Diagnosis present

## 2022-05-28 DIAGNOSIS — E782 Mixed hyperlipidemia: Secondary | ICD-10-CM | POA: Diagnosis present

## 2022-05-28 DIAGNOSIS — I509 Heart failure, unspecified: Secondary | ICD-10-CM | POA: Diagnosis present

## 2022-05-28 DIAGNOSIS — N1832 Chronic kidney disease, stage 3b: Secondary | ICD-10-CM | POA: Diagnosis present

## 2022-05-28 DIAGNOSIS — I351 Nonrheumatic aortic (valve) insufficiency: Secondary | ICD-10-CM | POA: Diagnosis present

## 2022-05-28 DIAGNOSIS — E876 Hypokalemia: Secondary | ICD-10-CM | POA: Diagnosis present

## 2022-05-28 DIAGNOSIS — Z79899 Other long term (current) drug therapy: Secondary | ICD-10-CM | POA: Diagnosis not present

## 2022-05-28 DIAGNOSIS — Z66 Do not resuscitate: Secondary | ICD-10-CM | POA: Diagnosis present

## 2022-05-28 DIAGNOSIS — R7989 Other specified abnormal findings of blood chemistry: Secondary | ICD-10-CM | POA: Diagnosis present

## 2022-05-28 DIAGNOSIS — Z87891 Personal history of nicotine dependence: Secondary | ICD-10-CM | POA: Diagnosis not present

## 2022-05-28 DIAGNOSIS — I77819 Aortic ectasia, unspecified site: Secondary | ICD-10-CM | POA: Diagnosis not present

## 2022-05-28 DIAGNOSIS — Z1152 Encounter for screening for COVID-19: Secondary | ICD-10-CM | POA: Diagnosis not present

## 2022-05-28 DIAGNOSIS — I77810 Thoracic aortic ectasia: Secondary | ICD-10-CM | POA: Diagnosis present

## 2022-05-28 DIAGNOSIS — R531 Weakness: Secondary | ICD-10-CM | POA: Diagnosis present

## 2022-05-28 DIAGNOSIS — Z8249 Family history of ischemic heart disease and other diseases of the circulatory system: Secondary | ICD-10-CM | POA: Diagnosis not present

## 2022-05-28 DIAGNOSIS — I480 Paroxysmal atrial fibrillation: Secondary | ICD-10-CM | POA: Diagnosis present

## 2022-05-28 DIAGNOSIS — Z801 Family history of malignant neoplasm of trachea, bronchus and lung: Secondary | ICD-10-CM | POA: Diagnosis not present

## 2022-05-28 DIAGNOSIS — Z800 Family history of malignant neoplasm of digestive organs: Secondary | ICD-10-CM | POA: Diagnosis not present

## 2022-05-28 DIAGNOSIS — I4891 Unspecified atrial fibrillation: Secondary | ICD-10-CM | POA: Diagnosis not present

## 2022-05-28 DIAGNOSIS — I350 Nonrheumatic aortic (valve) stenosis: Secondary | ICD-10-CM | POA: Diagnosis not present

## 2022-05-28 DIAGNOSIS — I4819 Other persistent atrial fibrillation: Secondary | ICD-10-CM | POA: Diagnosis not present

## 2022-05-28 DIAGNOSIS — E860 Dehydration: Secondary | ICD-10-CM | POA: Diagnosis present

## 2022-05-28 LAB — BASIC METABOLIC PANEL
Anion gap: 8 (ref 5–15)
BUN: 33 mg/dL — ABNORMAL HIGH (ref 8–23)
CO2: 25 mmol/L (ref 22–32)
Calcium: 9.2 mg/dL (ref 8.9–10.3)
Chloride: 103 mmol/L (ref 98–111)
Creatinine, Ser: 1.23 mg/dL (ref 0.61–1.24)
GFR, Estimated: 55 mL/min — ABNORMAL LOW (ref >60)
Glucose, Bld: 75 mg/dL (ref 70–99)
Potassium: 3 mmol/L — ABNORMAL LOW (ref 3.5–5.1)
Sodium: 136 mmol/L (ref 135–145)

## 2022-05-28 LAB — CBC
HCT: 29.7 % — ABNORMAL LOW (ref 39.0–52.0)
Hemoglobin: 10.3 g/dL — ABNORMAL LOW (ref 13.0–17.0)
MCH: 36 pg — ABNORMAL HIGH (ref 26.0–34.0)
MCHC: 34.7 g/dL (ref 30.0–36.0)
MCV: 103.8 fL — ABNORMAL HIGH (ref 80.0–100.0)
Platelets: 181 10*3/uL (ref 150–400)
RBC: 2.86 MIL/uL — ABNORMAL LOW (ref 4.22–5.81)
RDW: 13.9 % (ref 11.5–15.5)
WBC: 3.9 10*3/uL — ABNORMAL LOW (ref 4.0–10.5)
nRBC: 0 % (ref 0.0–0.2)

## 2022-05-28 LAB — ECHOCARDIOGRAM COMPLETE
AR max vel: 1.62 cm2
AV Area VTI: 1.75 cm2
AV Area mean vel: 1.57 cm2
AV Mean grad: 10 mmHg
AV Peak grad: 19.1 mmHg
Ao pk vel: 2.19 m/s
Area-P 1/2: 3.27 cm2
Calc EF: 60.5 %
Height: 69 in
MV M vel: 5.03 m/s
MV Peak grad: 101.2 mmHg
P 1/2 time: 723 msec
Single Plane A2C EF: 47.3 %
Single Plane A4C EF: 68.2 %
Weight: 5975.35 oz

## 2022-05-28 MED ORDER — POTASSIUM CHLORIDE CRYS ER 20 MEQ PO TBCR
40.0000 meq | EXTENDED_RELEASE_TABLET | ORAL | Status: AC
  Administered 2022-05-28 (×2): 40 meq via ORAL
  Filled 2022-05-28 (×2): qty 2

## 2022-05-28 NOTE — Evaluation (Signed)
Occupational Therapy Evaluation Patient Details Name: Brett Blankenship MRN: [id number] DOB: 1926/08/28 Today's Date: 05/28/2022   History of Present Illness Brett Blankenship is a 89 y.o. male with medical history significant of HTN, HLD, anemia, CKD stage 3b who presented to ED with complaints of cough/congestion. He was given a course of doxycycline on 05/17/22 with no improvement.  He has been complaining of generalized fatigue as well. He denies any shortness of breath. He has had leg swelling x 2 years, worse on the left. He denies orthopnea or dyspnea on exertion. He denies any chest pain or palpitations. He has gained some weight, 2-3 pounds. (per MD)   Clinical Impression   Pt agreeable to OT and PT co-evaluation. Pt generally weak needing min guard assist for ambulation and general mobility in the room. Improved balance with use of RW. Pt reported 2 falls in the past 6 months. B UE are generally weak. Pt recommended for home health OT services for general strengthening and a home safety assessment due to repeated falls. Pt will benefit from continued OT in the hospital and recommended venue below to increase strength, balance, and endurance for safe ADL's.         Recommendations for follow up therapy are one component of a multi-disciplinary discharge planning process, led by the attending physician.  Recommendations may be updated based on patient status, additional functional criteria and insurance authorization.   Follow Up Recommendations  Home health OT     Assistance Recommended at Discharge Intermittent Supervision/Assistance  Patient can return home with the following A little help with walking and/or transfers;A little help with bathing/dressing/bathroom;Assist for transportation;Assistance with cooking/housework    Functional Status Assessment  Patient has had a recent decline in their functional status and demonstrates the ability to make significant improvements in  function in a reasonable and predictable amount of time.  Equipment Recommendations  None recommended by OT    Recommendations for Other Services       Precautions / Restrictions Precautions Precautions: Fall Restrictions Weight Bearing Restrictions: No (Simultaneous filing. User may not have seen previous data.)      Mobility Bed Mobility Overal bed mobility: Modified Independent                  Transfers Overall transfer level: Needs assistance   Transfers: Sit to/from Stand, Bed to chair/wheelchair/BSC Sit to Stand: Supervision, Min guard     Step pivot transfers: Supervision, Min guard     General transfer comment: Able to complete step pivot to chair without AD. RW used in hall during ambulatory transfers. Mild labored weakness and decreased endurance.      Balance Overall balance assessment: Needs assistance Sitting-balance support: No upper extremity supported, Feet supported Sitting balance-Leahy Scale: Good Sitting balance - Comments: seated EOB   Standing balance support: No upper extremity supported, During functional activity Standing balance-Leahy Scale: Fair Standing balance comment: poor to fair without RW; fair with RW                           ADL either performed or assessed with clinical judgement   ADL Overall ADL's : Needs assistance/impaired     Grooming: Standing;Min guard   Upper Body Bathing: Set up;Sitting   Lower Body Bathing: Set up;Sitting/lateral leans   Upper Body Dressing : Set up;Sitting   Lower Body Dressing: Set up;Sitting/lateral leans   Toilet Transfer: Min guard;Ambulation Toilet Transfer Details (indicate cue type  and reason): Ambulation to toilet from chair leaning on walls PRN.         Functional mobility during ADLs: Min guard;Rolling walker (2 wheels) General ADL Comments: Min G assist to ambulate in hall >60 feet.     Vision Baseline Vision/History: 1 Wears glasses Ability to See in  Adequate Light: 0 Adequate Patient Visual Report: No change from baseline Vision Assessment?: No apparent visual deficits                Pertinent Vitals/Pain Pain Assessment Pain Assessment: No/denies pain     Hand Dominance Right   Extremity/Trunk Assessment Upper Extremity Assessment Upper Extremity Assessment: Generalized weakness   Lower Extremity Assessment Lower Extremity Assessment: Defer to PT evaluation   Cervical / Trunk Assessment Cervical / Trunk Assessment: Normal   Communication Communication Communication: No difficulties   Cognition Arousal/Alertness: Awake/alert Behavior During Therapy: WFL for tasks assessed/performed Overall Cognitive Status: Within Functional Limits for tasks assessed                                                        Home Living Family/patient expects to be discharged to:: Private residence Living Arrangements: Children Available Help at Discharge: Family;Available 24 hours/day (Pt lived alone but reports his son his coming to live with him.) Type of Home: Other(Comment) ("double wide") Home Access: Ramped entrance     Home Layout: One level (One elevated surace in the home.)     Bathroom Shower/Tub: Occupational psychologist: Standard ((toilet riser placed over the toilet)) Bathroom Accessibility: Yes How Accessible: Accessible via walker Home Equipment: Cane - quad;Cane - single point;Shower seat;Other (comment) (walk in grab bars)          Prior Functioning/Environment Prior Level of Function : Needs assist       Physical Assist : ADLs (physical)   ADLs (physical): IADLs Mobility Comments: PRN use of cane for household ambulation; uses cane outside. ADLs Comments: Independent for ADL's ; assist from neighbors for cooking and getting groceries. Does not drive.        OT Problem List: Decreased strength;Decreased activity tolerance;Impaired balance (sitting and/or standing)       OT Treatment/Interventions: Self-care/ADL training;Therapeutic exercise;Therapeutic activities;DME and/or AE instruction;Patient/family education;Balance training    OT Goals(Current goals can be found in the care plan section) Acute Rehab OT Goals Patient Stated Goal: return home OT Goal Formulation: With patient Time For Goal Achievement: 06/11/22 Potential to Achieve Goals: Good  OT Frequency: Min 1X/week    Co-evaluation PT/OT/SLP Co-Evaluation/Treatment: Yes Reason for Co-Treatment: To address functional/ADL transfers   OT goals addressed during session: ADL's and self-care      AM-PAC OT "6 Clicks" Daily Activity     Outcome Measure Help from another person eating meals?: None Help from another person taking care of personal grooming?: A Little Help from another person toileting, which includes using toliet, bedpan, or urinal?: A Little Help from another person bathing (including washing, rinsing, drying)?: A Little Help from another person to put on and taking off regular upper body clothing?: None Help from another person to put on and taking off regular lower body clothing?: A Little 6 Click Score: 20   End of Session Equipment Utilized During Treatment: Rolling walker (2 wheels);Other (comment) (cane) Nurse Communication: Mobility status  Activity Tolerance:  Patient tolerated treatment well Patient left: in chair;with call bell/phone within reach  OT Visit Diagnosis: Unsteadiness on feet (R26.81);Other abnormalities of gait and mobility (R26.89);Muscle weakness (generalized) (M62.81)                Time: 6314-9702 OT Time Calculation (min): 19 min Charges:  OT General Charges $OT Visit: 1 Visit OT Evaluation $OT Eval Low Complexity: 1 Low  Avyn Aden OT, MOT   Larey Seat 05/28/2022, 10:09 AM

## 2022-05-28 NOTE — Plan of Care (Signed)
  Problem: Acute Rehab OT Goals (only OT should resolve) Goal: Pt. Will Perform Grooming Flowsheets (Taken 05/28/2022 1011) Pt Will Perform Grooming:  with modified independence  standing Goal: Pt. Will Transfer To Toilet Flowsheets (Taken 05/28/2022 1011) Pt Will Transfer to Toilet:  with modified independence  ambulating Goal: Pt/Caregiver Will Perform Home Exercise Program Flowsheets (Taken 05/28/2022 1011) Pt/caregiver will Perform Home Exercise Program:  Increased strength  Both right and left upper extremity  Independently  Josue Falconi OT, MOT

## 2022-05-28 NOTE — Progress Notes (Signed)
  Echocardiogram 2D Echocardiogram has been performed.  Brett Blankenship 05/28/2022, 12:24 PM

## 2022-05-28 NOTE — Progress Notes (Signed)
Pt A&O x 3, disoriented to time. Diminished lung sounds and pt has non-productive cough. +3 pitting edema assessed in bilateral lower extremities. Pt had bowel movement last night. Vitals stable, pt slept through the night.

## 2022-05-28 NOTE — Plan of Care (Signed)
  Problem: Acute Rehab PT Goals(only PT should resolve) Goal: Pt Will Go Supine/Side To Sit Outcome: Progressing Flowsheets (Taken 05/28/2022 1223) Pt will go Supine/Side to Sit: Independently Goal: Patient Will Transfer Sit To/From Stand Outcome: Progressing Flowsheets (Taken 05/28/2022 1223) Patient will transfer sit to/from stand:  with modified independence  Independently Goal: Pt Will Transfer Bed To Chair/Chair To Bed Outcome: Progressing Flowsheets (Taken 05/28/2022 1223) Pt will Transfer Bed to Chair/Chair to Bed: with modified independence Goal: Pt Will Ambulate Outcome: Progressing Flowsheets (Taken 05/28/2022 1223) Pt will Ambulate:  100 feet  with rolling walker   12:24 PM, 05/28/22 Lonell Grandchild, MPT Physical Therapist with [facility] [phone] office [phone] mobile phone

## 2022-05-28 NOTE — Progress Notes (Signed)
PROGRESS NOTE    Brett Blankenship  WUG:[id number] DOB: [birth year] DOA: 05/27/2022 PCP: Claretta Fraise, MD     Brief Narrative:  Brett Blankenship is a 89 y.o. male with medical history significant of HTN, HLD, anemia, CKD stage 3b who presented to ED with complaints of cough/congestion. He was given a course of doxycycline on 05/17/22 with no improvement.  He has been complaining of generalized fatigue as well. He denies any shortness of breath. He has had leg swelling x 2 years, worse on the left. He denies orthopnea or dyspnea on exertion. He denies any chest pain or palpitations. He has gained some weight, 2-3 pounds.    He states his symptoms started about 2 months ago with fatigue and decreased PO intake. 3-4 weeks ago he got out of bed and fell trying to do a step up into another room. He had a rib fracture from this. His son was then sick with a fever and cough.  He then got a sinus infection and was treated.  He went back last week to see his physician and they didn't think he was any worse.  He continued to feel weak, mild cough and had a video visit today.   In the emergency department, patient was found to have new onset A-fib, rate controlled.  Exam also suggestive of CHF exacerbation.  He was given IV Lasix and admitted to the hospital.  New events last 24 hours / Subjective: Patient without any new complaints this morning.  Eating breakfast.  Remains in A-fib, rate controlled 70.  Assessment & Plan:   Principal Problem:   Acute exacerbation of CHF (congestive heart failure) (HCC) Active Problems:   Paroxysmal atrial fibrillation (HCC)   Elevated troponin   Macrocytic anemia   Stage 3b chronic kidney disease (CKD) (HCC)   Essential hypertension   Mixed hyperlipidemia   Acute CHF exacerbation -BNP 669, with peripheral edema -Previous echo was in 2018, which revealed EF 60 to 65% at that time -Echo pending -Lasix -Fluid restriction, strict I/Os, daily weight   -Cardiology consulted  New onset A Fib -CHA2DS2-VASC score of 4. Started on eliquis, metoprolol  -Tele -Cardiology consulted  Demand ischemia -Troponin 48, 49 -Due to above  CKD stage 3b -Baseline Cr 1.4  HTN -Metoprolol  Hypokalemia -Replace   DVT prophylaxis:  apixaban (ELIQUIS) tablet 5 mg  Code Status: DNR Family Communication: None at bedside  Disposition Plan:  Status is: Observation The patient will require care spanning > 2 midnights and should be moved to inpatient because: IV lasix, cardiology consulted  Consultants:  Cardiology   Procedures:  None   Antimicrobials:  Anti-infectives (From admission, onward)    None        Objective: Vitals:   05/27/22 1700 05/27/22 1819 05/27/22 2103 05/28/22 0338  BP: 125/62 (!) 109/43 127/63 132/66  Pulse:  70 70 70  Resp: '19 20 18 18  '$ Temp:  97.6 F (36.4 C) 97.8 F (36.6 C) 97.7 F (36.5 C)  TempSrc:    Oral  SpO2:  100% 99% 97%  Weight:  (!) 169.4 kg    Height:  '5\' 9"'$  (1.753 m)      Intake/Output Summary (Last 24 hours) at 05/28/2022 1006 Last data filed at 05/28/2022 0300 Gross per 24 hour  Intake --  Output 1425 ml  Net -1425 ml   Filed Weights   05/27/22 1045 05/27/22 1819  Weight: 82.7 kg (!) 169.4 kg    Examination:  General exam:  Appears calm and comfortable  Respiratory system: Clear to auscultation. Respiratory effort normal. No respiratory distress. No conversational dyspnea.  Cardiovascular system: S1 & S2 heard, Irreg rhythm. No murmurs. +Nonpitting edema L>R  Gastrointestinal system: Abdomen is nondistended, soft and nontender. Normal bowel sounds heard. Central nervous system: Alert and oriented. No focal neurological deficits. Speech clear.  Extremities: Symmetric in appearance  Skin: No rashes, lesions or ulcers on exposed skin  Psychiatry: Judgement and insight appear normal. Mood & affect appropriate.   Data Reviewed: I have personally reviewed following labs and imaging  studies  CBC: Recent Labs  Lab 05/27/22 1256 05/28/22 0413  WBC 4.1 3.9*  NEUTROABS 2.3  --   HGB 10.8* 10.3*  HCT 30.6* 29.7*  MCV 102.0* 103.8*  PLT 185 027   Basic Metabolic Panel: Recent Labs  Lab 05/27/22 1256 05/28/22 0413  NA 134* 136  K 3.6 3.0*  CL 102 103  CO2 25 25  GLUCOSE 86 75  BUN 35* 33*  CREATININE 1.39* 1.23  CALCIUM 9.3 9.2  MG 1.8  --    GFR: Estimated Creatinine Clearance: 58.5 mL/min (by C-G formula based on SCr of 1.23 mg/dL). Liver Function Tests: Recent Labs  Lab 05/27/22 1256  AST 30  ALT 19  ALKPHOS 40  BILITOT 1.4*  PROT 6.2*  ALBUMIN 2.4*   No results for input(s): "LIPASE", "AMYLASE" in the last 168 hours. No results for input(s): "AMMONIA" in the last 168 hours. Coagulation Profile: No results for input(s): "INR", "PROTIME" in the last 168 hours. Cardiac Enzymes: No results for input(s): "CKTOTAL", "CKMB", "CKMBINDEX", "TROPONINI" in the last 168 hours. BNP (last 3 results) No results for input(s): "PROBNP" in the last 8760 hours. HbA1C: No results for input(s): "HGBA1C" in the last 72 hours. CBG: No results for input(s): "GLUCAP" in the last 168 hours. Lipid Profile: No results for input(s): "CHOL", "HDL", "LDLCALC", "TRIG", "CHOLHDL", "LDLDIRECT" in the last 72 hours. Thyroid Function Tests: Recent Labs    05/27/22 1256 05/27/22 1537  TSH 1.180  --   FREET4  --  1.84*   Anemia Panel: Recent Labs    05/27/22 1256 05/27/22 1537  VITAMINB12  --  851  FOLATE 9.6  --   FERRITIN  --  477*  TIBC  --  249*  IRON  --  112   Sepsis Labs: No results for input(s): "PROCALCITON", "LATICACIDVEN" in the last 168 hours.  Recent Results (from the past 240 hour(s))  Resp panel by RT-PCR (RSV, Flu A&B, Covid) Anterior Nasal Swab     Status: None   Collection Time: 05/27/22 10:53 AM   Specimen: Anterior Nasal Swab  Result Value Ref Range Status   SARS Coronavirus 2 by RT PCR NEGATIVE NEGATIVE Final    Comment:  (NOTE) SARS-CoV-2 target nucleic acids are NOT DETECTED.  The SARS-CoV-2 RNA is generally detectable in upper respiratory specimens during the acute phase of infection. The lowest concentration of SARS-CoV-2 viral copies this assay can detect is 138 copies/mL. A negative result does not preclude SARS-Cov-2 infection and should not be used as the sole basis for treatment or other patient management decisions. A negative result may occur with  improper specimen collection/handling, submission of specimen other than nasopharyngeal swab, presence of viral mutation(s) within the areas targeted by this assay, and inadequate number of viral copies(<138 copies/mL). A negative result must be combined with clinical observations, patient history, and epidemiological information. The expected result is Negative.  Fact Sheet for Patients:  EntrepreneurPulse.com.au  Fact Sheet for Healthcare Providers:  IncredibleEmployment.be  This test is no t yet approved or cleared by the [place] FDA and  has been authorized for detection and/or diagnosis of SARS-CoV-2 by FDA under an Emergency Use Authorization (EUA). This EUA will remain  in effect (meaning this test can be used) for the duration of the COVID-19 declaration under Section 564(b)(1) of the Act, 21 U.S.C.section 360bbb-3(b)(1), unless the authorization is terminated  or revoked sooner.       Influenza A by PCR NEGATIVE NEGATIVE Final   Influenza B by PCR NEGATIVE NEGATIVE Final    Comment: (NOTE) The Xpert Xpress SARS-CoV-2/FLU/RSV plus assay is intended as an aid in the diagnosis of influenza from Nasopharyngeal swab specimens and should not be used as a sole basis for treatment. Nasal washings and aspirates are unacceptable for Xpert Xpress SARS-CoV-2/FLU/RSV testing.  Fact Sheet for Patients: EntrepreneurPulse.com.au  Fact Sheet for Healthcare  Providers: IncredibleEmployment.be  This test is not yet approved or cleared by the [place] FDA and has been authorized for detection and/or diagnosis of SARS-CoV-2 by FDA under an Emergency Use Authorization (EUA). This EUA will remain in effect (meaning this test can be used) for the duration of the COVID-19 declaration under Section 564(b)(1) of the Act, 21 U.S.C. section 360bbb-3(b)(1), unless the authorization is terminated or revoked.     Resp Syncytial Virus by PCR NEGATIVE NEGATIVE Final    Comment: (NOTE) Fact Sheet for Patients: EntrepreneurPulse.com.au  Fact Sheet for Healthcare Providers: IncredibleEmployment.be  This test is not yet approved or cleared by the [place] FDA and has been authorized for detection and/or diagnosis of SARS-CoV-2 by FDA under an Emergency Use Authorization (EUA). This EUA will remain in effect (meaning this test can be used) for the duration of the COVID-19 declaration under Section 564(b)(1) of the Act, 21 U.S.C. section 360bbb-3(b)(1), unless the authorization is terminated or revoked.  Performed at [facility], [address], [place], [place] [address]   Respiratory (~20 pathogens) panel by PCR     Status: None   Collection Time: 05/27/22  3:39 PM   Specimen: Nasopharyngeal Swab; Respiratory  Result Value Ref Range Status   Adenovirus NOT DETECTED NOT DETECTED Final   Coronavirus 229E NOT DETECTED NOT DETECTED Final    Comment: (NOTE) The Coronavirus on the Respiratory Panel, DOES NOT test for the novel  Coronavirus (2019 nCoV)    Coronavirus HKU1 NOT DETECTED NOT DETECTED Final   Coronavirus NL63 NOT DETECTED NOT DETECTED Final   Coronavirus OC43 NOT DETECTED NOT DETECTED Final   Metapneumovirus NOT DETECTED NOT DETECTED Final   Rhinovirus / Enterovirus NOT DETECTED NOT DETECTED Final   Influenza A NOT DETECTED NOT DETECTED Final   Influenza B NOT DETECTED NOT  DETECTED Final   Parainfluenza Virus 1 NOT DETECTED NOT DETECTED Final   Parainfluenza Virus 2 NOT DETECTED NOT DETECTED Final   Parainfluenza Virus 3 NOT DETECTED NOT DETECTED Final   Parainfluenza Virus 4 NOT DETECTED NOT DETECTED Final   Respiratory Syncytial Virus NOT DETECTED NOT DETECTED Final   Bordetella pertussis NOT DETECTED NOT DETECTED Final   Bordetella Parapertussis NOT DETECTED NOT DETECTED Final   Chlamydophila pneumoniae NOT DETECTED NOT DETECTED Final   Mycoplasma pneumoniae NOT DETECTED NOT DETECTED Final    Comment: Performed at [facility], [address], [place], [place] [address]      Radiology Studies: DG Chest 2 View  Result Date: 05/27/2022 CLINICAL DATA:  Several month history  of bilateral chest pain EXAM: CHEST - 2 VIEW COMPARISON:  Chest radiograph dated 11/21/2017 FINDINGS: Normal lung volumes. No focal consolidations. No pleural effusion or pneumothorax. Annular calcification involving the presumed tricuspid valve. The visualized skeletal structures are unremarkable. IMPRESSION: No active cardiopulmonary disease. Electronically Signed   By: Darrin Nipper M.D.   On: 05/27/2022 11:15      Scheduled Meds:  apixaban  5 mg Oral BID   furosemide  40 mg Intravenous Daily   metoprolol tartrate  12.5 mg Oral BID   potassium chloride  40 mEq Oral Q4H   sodium chloride flush  3 mL Intravenous Q12H   Continuous Infusions:  sodium chloride       LOS: 0 days   Time spent: 35 minutes   Dessa Phi, DO [facility] 05/28/2022, 10:06 AM   Available via Epic secure chat 7am-7pm After these hours, please refer to coverage provider listed on [email or web address]

## 2022-05-28 NOTE — TOC Initial Note (Signed)
Transition of Care [facility]) - Initial/Assessment Note    Patient Details  Name: Brett Blankenship MRN: [id number] Date of Birth: Aug 17, 1926  Transition of Care [facility] CM/SW Contact:    Salome Arnt, LCSW Phone Number: 05/28/2022, 11:20 AM  Clinical Narrative: Pt admitted due to acute exacerbation of CHF. Pt reports he lives alone, but his son will be moving in with him soon. PT/OT evaluated pt and recommend home health. Pt is agreeable with no preference on agency. Referred and accepted by Caryl Pina with [facility]. Orders in. PT also recommending rolling walker. Pt agreeable with no preference on agency. Referred to [facility] with [employer] who will drop ship. TOC will continue to follow.                 Expected Discharge Plan: [place] Barriers to Discharge: Continued Medical Work up   Patient Goals and CMS Choice Patient states their goals for this hospitalization and ongoing recovery are:: return home   Choice offered to / list presented to : Patient [place] ownership interest in [facility].provided to::  (n/a)    Expected Discharge Plan and Services In-house Referral: Clinical Social Work     Living arrangements for the past 2 months: [place]                 DME Arranged: Walker rolling DME Agency: [employer] Date DME Agency Contacted: 05/28/22 Time DME Agency Contacted: 75 Representative spoke with at DME Agency: Erasmo Downer HH Arranged: PT [place] Agency: [place] ([place]) Date [place]: 05/28/22 Time [place]: [id number] Representative spoke with at [place]: [place] Arrangements/Services Living arrangements for the past 2 months: [place] with:: Self Patient language and need for interpreter reviewed:: Yes Do you feel safe going back to the place where you live?: Yes          Current home services: DME (cane) Criminal Activity/Legal Involvement Pertinent to Current  Situation/Hospitalization: No - Comment as needed  Activities of Daily Living Home Assistive Devices/Equipment: Cane (specify quad or straight), Shower chair with back, Bedside commode/3-in-1 ADL Screening (condition at time of admission) Patient's cognitive ability adequate to safely complete daily activities?: Yes Is the patient deaf or have difficulty hearing?: Yes Does the patient have difficulty seeing, even when wearing glasses/contacts?: No Does the patient have difficulty concentrating, remembering, or making decisions?: No Patient able to express need for assistance with ADLs?: Yes Does the patient have difficulty dressing or bathing?: No Independently performs ADLs?: Yes (appropriate for developmental age) Does the patient have difficulty walking or climbing stairs?: Yes Weakness of Legs: Both Weakness of Arms/Hands: Both  Permission Sought/Granted                  Emotional Assessment     Affect (typically observed): Appropriate Orientation: : Oriented to Self, Oriented to  Time, Oriented to Place, Oriented to Situation Alcohol / Substance Use: Not Applicable Psych Involvement: No (comment)  Admission diagnosis:  Acute exacerbation of CHF (congestive heart failure) ([place]) [I50.9] Atrial fibrillation, unspecified type ([place]) [I48.91] Acute congestive heart failure, unspecified heart failure type ([place]) [I50.9] Patient Active Problem List   Diagnosis Date Noted   Elevated troponin 05/27/2022   Macrocytic anemia 05/27/2022   Stage 3b chronic kidney disease (CKD) ([place]) 05/27/2022   Acute exacerbation of CHF (congestive heart failure) ([place]) 05/27/2022   Paroxysmal atrial fibrillation ([place]) 05/27/2022   Mixed hyperlipidemia 09/04/2021   Vitamin  D deficiency 78/41/2820   Systolic murmur 81/38/8719   Bradycardia 12/01/2016   Chronic pain of left knee 10/10/2016   Essential hypertension 07/12/2009   PCP:  Claretta Fraise, MD Pharmacy:   [place], [place] [address]  [place] [address] Phone: [phone] Fax: [phone]     Social Determinants of Health (SDOH) Social History: [place]: No Food Insecurity (05/28/2022)  Housing: Low Risk  (05/28/2022)  Transportation Needs: No Transportation Needs (05/28/2022)  Utilities: Not At Risk (05/28/2022)  Alcohol Screen: Low Risk  (03/01/2022)  Depression (PHQ2-9): High Risk (05/23/2022)  Financial Resource Strain: Low Risk  (03/01/2022)  Physical Activity: Sufficiently Active (03/01/2022)  Social Connections: Moderately Isolated (03/01/2022)  Stress: No Stress Concern Present (03/01/2022)  Tobacco Use: Medium Risk (05/27/2022)   SDOH Interventions:     Readmission Risk Interventions     No data to display

## 2022-05-28 NOTE — Evaluation (Signed)
Physical Therapy Evaluation Patient Details Name: Brett Blankenship MRN: [id number] DOB: 1926/12/10 Today's Date: 05/28/2022  History of Present Illness  Brett Blankenship is a 89 y.o. male with medical history significant of HTN, HLD, anemia, CKD stage 3b who presented to ED with complaints of cough/congestion. He was given a course of doxycycline on 05/17/22 with no improvement.  He has been complaining of generalized fatigue as well. He denies any shortness of breath. He has had leg swelling x 2 years, worse on the left. He denies orthopnea or dyspnea on exertion. He denies any chest pain or palpitations. He has gained some weight, 2-3 pounds.   Clinical Impression  Patient slightly unsteady using SPC for ambulation in room, safer using RW for longer distances with good return demonstrated for use ambulating in room/hallway without loss of balance.  Patient tolerated sitting up in chair after therapy.  Patient will benefit from continued skilled physical therapy in hospital and recommended venue below to increase strength, balance, endurance for safe ADLs and gait.         Recommendations for follow up therapy are one component of a multi-disciplinary discharge planning process, led by the attending physician.  Recommendations may be updated based on patient status, additional functional criteria and insurance authorization.  Follow Up Recommendations Home health PT      Assistance Recommended at Discharge Set up Supervision/Assistance  Patient can return home with the following  A little help with walking and/or transfers;A little help with bathing/dressing/bathroom;Help with stairs or ramp for entrance;Assistance with cooking/housework    Equipment Recommendations Rolling walker (2 wheels)  Recommendations for Other Services       Functional Status Assessment       Precautions / Restrictions Precautions Precautions: Fall Restrictions Weight Bearing Restrictions: No       Mobility  Bed Mobility Overal bed mobility: Modified Independent                  Transfers Overall transfer level: Needs assistance   Transfers: Sit to/from Stand, Bed to chair/wheelchair/BSC Sit to Stand: Supervision, Min guard   Step pivot transfers: Supervision, Min guard       General transfer comment: slightly labored movement having to lean on nearby objects for support when not using an AD, limited to short distances using SPC, safer using RW for outside of room    Ambulation/Gait Ambulation/Gait assistance: Supervision, Min guard Gait Distance (Feet): 75 Feet Assistive device: Rolling walker (2 wheels) Gait Pattern/deviations: Decreased step length - right, Decreased step length - left, Decreased stride length Gait velocity: decreased     General Gait Details: slightly labored cadence without loss of balance using RW, limited mostly due to c/o fatigue  Stairs            Wheelchair Mobility    Modified Rankin (Stroke Patients Only)       Balance                                             Pertinent Vitals/Pain Pain Assessment Pain Assessment: No/denies pain    Home Living Family/patient expects to be discharged to:: Private residence Living Arrangements: Children Available Help at Discharge: Family;Available 24 hours/day Type of Home: Mobile home Home Access: Ramped entrance       Home Layout: One level Home Equipment: Cane - quad;Cane - single point;Shower seat;Other (comment)  Prior Function Prior Level of Function : Needs assist       Physical Assist : ADLs (physical);Mobility (physical) Mobility (physical): Bed mobility;Transfers;Gait;Stairs ADLs (physical): IADLs Mobility Comments: PRN use of cane for household ambulation; uses cane outside. ADLs Comments: Independent for ADL's ; assist from neighbors for cooking and getting groceries. Does not drive.     Hand Dominance   Dominant Hand:  Right    Extremity/Trunk Assessment   Upper Extremity Assessment Upper Extremity Assessment: Defer to OT evaluation    Lower Extremity Assessment Lower Extremity Assessment: Generalized weakness    Cervical / Trunk Assessment Cervical / Trunk Assessment: Normal  Communication   Communication: No difficulties  Cognition Arousal/Alertness: Awake/alert Behavior During Therapy: WFL for tasks assessed/performed Overall Cognitive Status: Within Functional Limits for tasks assessed                                          General Comments      Exercises     Assessment/Plan    PT Assessment Patient needs continued PT services  PT Problem List Decreased strength;Decreased activity tolerance;Decreased balance;Decreased mobility       PT Treatment Interventions DME instruction;Gait training;Stair training;Functional mobility training;Therapeutic activities;Therapeutic exercise;Patient/family education;Balance training    PT Goals (Current goals can be found in the Care Plan section)  Acute Rehab PT Goals Patient Stated Goal: return home with family to assist PT Goal Formulation: With patient Time For Goal Achievement: 05/31/22 Potential to Achieve Goals: Good    Frequency Min 2X/week     Co-evaluation PT/OT/SLP Co-Evaluation/Treatment: Yes Reason for Co-Treatment: To address functional/ADL transfers PT goals addressed during session: Mobility/safety with mobility;Balance;Proper use of DME OT goals addressed during session: ADL's and self-care       AM-PAC PT "6 Clicks" Mobility  Outcome Measure Help needed turning from your back to your side while in a flat bed without using bedrails?: None Help needed moving from lying on your back to sitting on the side of a flat bed without using bedrails?: None Help needed moving to and from a bed to a chair (including a wheelchair)?: A Little Help needed standing up from a chair using your arms (e.g., wheelchair  or bedside chair)?: A Little Help needed to walk in hospital room?: A Little Help needed climbing 3-5 steps with a railing? : A Little 6 Click Score: 20    End of Session   Activity Tolerance: Patient tolerated treatment well;Patient limited by fatigue Patient left: in chair;with call bell/phone within reach Nurse Communication: Mobility status PT Visit Diagnosis: Unsteadiness on feet (R26.81);Other abnormalities of gait and mobility (R26.89);Muscle weakness (generalized) (M62.81)    Time: 9470-9628 PT Time Calculation (min) (ACUTE ONLY): 22 min   Charges:   PT Evaluation $PT Eval Moderate Complexity: 1 Mod PT Treatments $Therapeutic Activity: 8-22 mins        12:22 PM, 05/28/22 Lonell Grandchild, MPT Physical Therapist with [facility] [phone] office [phone] mobile phone

## 2022-05-29 DIAGNOSIS — I77819 Aortic ectasia, unspecified site: Secondary | ICD-10-CM | POA: Diagnosis not present

## 2022-05-29 DIAGNOSIS — I5031 Acute diastolic (congestive) heart failure: Secondary | ICD-10-CM | POA: Diagnosis not present

## 2022-05-29 DIAGNOSIS — R7989 Other specified abnormal findings of blood chemistry: Secondary | ICD-10-CM

## 2022-05-29 DIAGNOSIS — I480 Paroxysmal atrial fibrillation: Secondary | ICD-10-CM

## 2022-05-29 DIAGNOSIS — I4891 Unspecified atrial fibrillation: Secondary | ICD-10-CM

## 2022-05-29 DIAGNOSIS — I351 Nonrheumatic aortic (valve) insufficiency: Secondary | ICD-10-CM

## 2022-05-29 DIAGNOSIS — I509 Heart failure, unspecified: Secondary | ICD-10-CM | POA: Diagnosis not present

## 2022-05-29 DIAGNOSIS — N1832 Chronic kidney disease, stage 3b: Secondary | ICD-10-CM | POA: Diagnosis not present

## 2022-05-29 LAB — BASIC METABOLIC PANEL
Anion gap: 5 (ref 5–15)
BUN: 37 mg/dL — ABNORMAL HIGH (ref 8–23)
CO2: 25 mmol/L (ref 22–32)
Calcium: 9.1 mg/dL (ref 8.9–10.3)
Chloride: 105 mmol/L (ref 98–111)
Creatinine, Ser: 1.12 mg/dL (ref 0.61–1.24)
GFR, Estimated: >60 mL/min (ref >60)
Glucose, Bld: 80 mg/dL (ref 70–99)
Potassium: 3.5 mmol/L (ref 3.5–5.1)
Sodium: 135 mmol/L (ref 135–145)

## 2022-05-29 LAB — MAGNESIUM: Magnesium: 1.9 mg/dL (ref 1.7–2.4)

## 2022-05-29 MED ORDER — AMLODIPINE BESYLATE 5 MG PO TABS
5.0000 mg | ORAL_TABLET | Freq: Every day | ORAL | Status: DC
  Administered 2022-05-30: 5 mg via ORAL
  Filled 2022-05-29: qty 1

## 2022-05-29 MED ORDER — FUROSEMIDE 20 MG PO TABS
20.0000 mg | ORAL_TABLET | Freq: Every day | ORAL | Status: DC
  Administered 2022-05-30: 20 mg via ORAL
  Filled 2022-05-29: qty 1

## 2022-05-29 NOTE — Care Management Important Message (Signed)
Important Message  Patient Details  Name: Brett Blankenship MRN: [id number] Date of Birth: Jun 16, 1926   Medicare Important Message Given:  N/A - LOS <3 / Initial given by admissions     Tommy Medal 05/29/2022, 10:41 AM

## 2022-05-29 NOTE — Progress Notes (Addendum)
PROGRESS NOTE    Brett Blankenship  GLO:[id number] DOB: [birth year] DOA: 05/27/2022 PCP: Claretta Fraise, MD     Brief Narrative:  Brett Blankenship is a 89 y.o. male with medical history significant of HTN, HLD, anemia, CKD stage 3b who presented to ED with complaints of cough/congestion. He was given a course of doxycycline on 05/17/22 with no improvement.  He has been complaining of generalized fatigue as well. He denies any shortness of breath. He has had leg swelling x 2 years, worse on the left. He denies orthopnea or dyspnea on exertion. He denies any chest pain or palpitations. He has gained some weight, 2-3 pounds. He states his symptoms started about 2 months ago with fatigue and decreased PO intake. 3-4 weeks ago he got out of bed and fell trying to do a step up into another room. He had a rib fracture from this.  Patient continued to do poorly and presented to the ED. In the ED, patient was found to have new onset A-fib, rate controlled.  Exam also suggestive of CHF exacerbation.  He was given IV Lasix and admitted to the hospital.  New events last 24 hours / Subjective: Today, patient denies any new complaints.   Assessment & Plan:   Principal Problem:   Acute exacerbation of CHF (congestive heart failure) (HCC) Active Problems:   Paroxysmal atrial fibrillation (HCC)   Elevated troponin   Macrocytic anemia   Stage 3b chronic kidney disease (CKD) (HCC)   Essential hypertension   Mixed hyperlipidemia   Acute CHF (congestive heart failure) (HCC)   Acute CHF exacerbation likely 2/2 severe aortic valve regurgitation BNP 669, with peripheral edema on presentation Echo showed EF of 60 to 65%, unable to evaluate regional wall motion abnormality, left ventricular diastolic function could not be evaluated likely due to A-fib Noted possible severe aortic valve regurgitation Cardiology consulted, not a surgical candidate for valve replacement, recommend medical management Switched Lasix  to 20 mg once daily Outpatient cardiology follow-up within 1 month upon discharge  New onset A Fib Rate controlled Cardiology consulted, given history of severe aortic valve regurgitation, metoprolol discontinued CHA2DS2-VASC score of 4 Continue eliquis Tele  Demand ischemia Troponin 48, 49 Due to above  CKD stage 3b Baseline Cr 1.4  HTN BP currently soft to stable Patient on amlodipine 10 mg, triamterene-HCTZ PTA Will restart amlodipine at 5 mg pending BP and plan to discontinue triamterene-HCTZ upon discharge    DVT prophylaxis:  apixaban (ELIQUIS) tablet 5 mg  Code Status: DNR Family Communication: None at bedside  Disposition Plan:  Status is: Inpatient   Consultants:  Cardiology   Procedures:  None   Antimicrobials:  Anti-infectives (From admission, onward)    None        Objective: Vitals:   05/28/22 2139 05/29/22 0347 05/29/22 0500 05/29/22 0826  BP: (!) 124/43 (!) 101/44  (!) 126/56  Pulse: (!) 59 60  70  Resp: 18 18    Temp: 98 F (36.7 C) 98.1 F (36.7 C)    TempSrc: Oral Oral    SpO2: 98% 99%    Weight:   76.3 kg   Height:        Intake/Output Summary (Last 24 hours) at 05/29/2022 1826 Last data filed at 05/29/2022 1500 Gross per 24 hour  Intake 480 ml  Output 600 ml  Net -120 ml   Filed Weights   05/27/22 1045 05/27/22 1819 05/29/22 0500  Weight: 82.7 kg (!) 169.4 kg 76.3 kg  Examination:  General: NAD  Cardiovascular: S1, S2 present Respiratory: CTAB Abdomen: Soft, nontender, nondistended, bowel sounds present Musculoskeletal: No bilateral pedal edema noted Skin: Normal Psychiatry: Normal mood    Data Reviewed: I have personally reviewed following labs and imaging studies  CBC: Recent Labs  Lab 05/27/22 1256 05/28/22 0413  WBC 4.1 3.9*  NEUTROABS 2.3  --   HGB 10.8* 10.3*  HCT 30.6* 29.7*  MCV 102.0* 103.8*  PLT 185 546   Basic Metabolic Panel: Recent Labs  Lab 05/27/22 1256 05/28/22 0413 05/29/22 0501   NA 134* 136 135  K 3.6 3.0* 3.5  CL 102 103 105  CO2 '25 25 25  '$ GLUCOSE 86 75 80  BUN 35* 33* 37*  CREATININE 1.39* 1.23 1.12  CALCIUM 9.3 9.2 9.1  MG 1.8  --  1.9   GFR: Estimated Creatinine Clearance: 41.2 mL/min (by C-G formula based on SCr of 1.12 mg/dL). Liver Function Tests: Recent Labs  Lab 05/27/22 1256  AST 30  ALT 19  ALKPHOS 40  BILITOT 1.4*  PROT 6.2*  ALBUMIN 2.4*   No results for input(s): "LIPASE", "AMYLASE" in the last 168 hours. No results for input(s): "AMMONIA" in the last 168 hours. Coagulation Profile: No results for input(s): "INR", "PROTIME" in the last 168 hours. Cardiac Enzymes: No results for input(s): "CKTOTAL", "CKMB", "CKMBINDEX", "TROPONINI" in the last 168 hours. BNP (last 3 results) No results for input(s): "PROBNP" in the last 8760 hours. HbA1C: No results for input(s): "HGBA1C" in the last 72 hours. CBG: No results for input(s): "GLUCAP" in the last 168 hours. Lipid Profile: No results for input(s): "CHOL", "HDL", "LDLCALC", "TRIG", "CHOLHDL", "LDLDIRECT" in the last 72 hours. Thyroid Function Tests: Recent Labs    05/27/22 1256 05/27/22 1537  TSH 1.180  --   FREET4  --  1.84*   Anemia Panel: Recent Labs    05/27/22 1256 05/27/22 1537  VITAMINB12  --  851  FOLATE 9.6  --   FERRITIN  --  477*  TIBC  --  249*  IRON  --  112   Sepsis Labs: No results for input(s): "PROCALCITON", "LATICACIDVEN" in the last 168 hours.  Recent Results (from the past 240 hour(s))  Resp panel by RT-PCR (RSV, Flu A&B, Covid) Anterior Nasal Swab     Status: None   Collection Time: 05/27/22 10:53 AM   Specimen: Anterior Nasal Swab  Result Value Ref Range Status   SARS Coronavirus 2 by RT PCR NEGATIVE NEGATIVE Final    Comment: (NOTE) SARS-CoV-2 target nucleic acids are NOT DETECTED.  The SARS-CoV-2 RNA is generally detectable in upper respiratory specimens during the acute phase of infection. The lowest concentration of SARS-CoV-2 viral  copies this assay can detect is 138 copies/mL. A negative result does not preclude SARS-Cov-2 infection and should not be used as the sole basis for treatment or other patient management decisions. A negative result may occur with  improper specimen collection/handling, submission of specimen other than nasopharyngeal swab, presence of viral mutation(s) within the areas targeted by this assay, and inadequate number of viral copies(<138 copies/mL). A negative result must be combined with clinical observations, patient history, and epidemiological information. The expected result is Negative.  Fact Sheet for Patients:  EntrepreneurPulse.com.au  Fact Sheet for Healthcare Providers:  IncredibleEmployment.be  This test is no t yet approved or cleared by the [place] FDA and  has been authorized for detection and/or diagnosis of SARS-CoV-2 by FDA under an Emergency Use Authorization (EUA). This  EUA will remain  in effect (meaning this test can be used) for the duration of the COVID-19 declaration under Section 564(b)(1) of the Act, 21 U.S.C.section 360bbb-3(b)(1), unless the authorization is terminated  or revoked sooner.       Influenza A by PCR NEGATIVE NEGATIVE Final   Influenza B by PCR NEGATIVE NEGATIVE Final    Comment: (NOTE) The Xpert Xpress SARS-CoV-2/FLU/RSV plus assay is intended as an aid in the diagnosis of influenza from Nasopharyngeal swab specimens and should not be used as a sole basis for treatment. Nasal washings and aspirates are unacceptable for Xpert Xpress SARS-CoV-2/FLU/RSV testing.  Fact Sheet for Patients: EntrepreneurPulse.com.au  Fact Sheet for Healthcare Providers: IncredibleEmployment.be  This test is not yet approved or cleared by the [place] FDA and has been authorized for detection and/or diagnosis of SARS-CoV-2 by FDA under an Emergency Use Authorization (EUA). This  EUA will remain in effect (meaning this test can be used) for the duration of the COVID-19 declaration under Section 564(b)(1) of the Act, 21 U.S.C. section 360bbb-3(b)(1), unless the authorization is terminated or revoked.     Resp Syncytial Virus by PCR NEGATIVE NEGATIVE Final    Comment: (NOTE) Fact Sheet for Patients: EntrepreneurPulse.com.au  Fact Sheet for Healthcare Providers: IncredibleEmployment.be  This test is not yet approved or cleared by the [place] FDA and has been authorized for detection and/or diagnosis of SARS-CoV-2 by FDA under an Emergency Use Authorization (EUA). This EUA will remain in effect (meaning this test can be used) for the duration of the COVID-19 declaration under Section 564(b)(1) of the Act, 21 U.S.C. section 360bbb-3(b)(1), unless the authorization is terminated or revoked.  Performed at [facility], [address], [place], [place] [address]   Respiratory (~20 pathogens) panel by PCR     Status: None   Collection Time: 05/27/22  3:39 PM   Specimen: Nasopharyngeal Swab; Respiratory  Result Value Ref Range Status   Adenovirus NOT DETECTED NOT DETECTED Final   Coronavirus 229E NOT DETECTED NOT DETECTED Final    Comment: (NOTE) The Coronavirus on the Respiratory Panel, DOES NOT test for the novel  Coronavirus (2019 nCoV)    Coronavirus HKU1 NOT DETECTED NOT DETECTED Final   Coronavirus NL63 NOT DETECTED NOT DETECTED Final   Coronavirus OC43 NOT DETECTED NOT DETECTED Final   Metapneumovirus NOT DETECTED NOT DETECTED Final   Rhinovirus / Enterovirus NOT DETECTED NOT DETECTED Final   Influenza A NOT DETECTED NOT DETECTED Final   Influenza B NOT DETECTED NOT DETECTED Final   Parainfluenza Virus 1 NOT DETECTED NOT DETECTED Final   Parainfluenza Virus 2 NOT DETECTED NOT DETECTED Final   Parainfluenza Virus 3 NOT DETECTED NOT DETECTED Final   Parainfluenza Virus 4 NOT DETECTED NOT DETECTED Final    Respiratory Syncytial Virus NOT DETECTED NOT DETECTED Final   Bordetella pertussis NOT DETECTED NOT DETECTED Final   Bordetella Parapertussis NOT DETECTED NOT DETECTED Final   Chlamydophila pneumoniae NOT DETECTED NOT DETECTED Final   Mycoplasma pneumoniae NOT DETECTED NOT DETECTED Final    Comment: Performed at [facility], [address], [place], [place] [address]      Radiology Studies: ECHOCARDIOGRAM COMPLETE  Result Date: 05/28/2022    ECHOCARDIOGRAM REPORT   Patient Name:   Brett Blankenship Date of Exam: 05/28/2022 Medical Rec #:  [id number]         Height:       69.0 in Accession #:    [id number]  Weight:       373.5 lb Date of Birth:  [birth year]         BSA:          2.694 m Patient Age:    36 years          BP:           127/66 mmHg Patient Gender: M                 HR:           65 bpm. Exam Location:  [facility] Procedure: 2D Echo, Cardiac Doppler and Color Doppler Indications:    Atrial Fibrillation I48.91  History:        Patient has prior history of Echocardiogram examinations, most                 recent 12/09/2016. CHF, Arrythmias:Atrial Fibrillation; Risk                 Factors:Dyslipidemia and Hypertension.  Sonographer:    Greer Pickerel Referring Phys: [id number] Orma Flaming  Sonographer Comments: Image acquisition challenging due to patient body habitus and Image acquisition challenging due to respiratory motion. IMPRESSIONS  1. The aortic valve was not well visualized. There is an eccentric aortic valve regurgitation jet impinging along the ventricular surface of anterior mitral valve leaflet resulting in thickening/restricted mobility of anterior mitral valve leaflet with mild mitral valve regurgitation consistent with chronic, probably severe aortic valve regurgitation. Mild aortic valve stenosis. Consider TEE for further evaluation of aortic valve.  2. Aortic dilatation noted. There is mild dilatation of the aortic root, measuring 43 mm. There is moderate dilatation of  the ascending aorta, measuring 49 mm.  3. Left ventricular ejection fraction, by estimation, is 60 to 65%. The left ventricle has normal function. Left ventricular endocardial border not optimally defined to evaluate regional wall motion. There is moderate asymmetric left ventricular hypertrophy of the septal and basal segments. Left ventricular diastolic function could not be evaluated.  4. Right ventricular systolic function was not well visualized. The right ventricular size is normal.  5. Left atrial size was severely dilated.  6. The mitral valve is abnormal. Mild mitral valve regurgitation. No evidence of mitral stenosis. Moderate mitral annular calcification. Comparison(s): Prior images unable to be directly viewed, comparison made by report only. Changes from prior study are noted. Normal LVEF and eccentric aortic valve regurgitation likely severe and chronic in duration (worsened from moderate aortic regurgitation from 2018). FINDINGS  Left Ventricle: Left ventricular ejection fraction, by estimation, is 60 to 65%. The left ventricle has normal function. Left ventricular endocardial border not optimally defined to evaluate regional wall motion. The left ventricular internal cavity size was normal in size. There is moderate asymmetric left ventricular hypertrophy of the septal and basal segments. Left ventricular diastolic function could not be evaluated due to atrial fibrillation. Left ventricular diastolic function could not be evaluated. Right Ventricle: The right ventricular size is normal. No increase in right ventricular wall thickness. Right ventricular systolic function was not well visualized. Left Atrium: Left atrial size was severely dilated. Right Atrium: Right atrial size was normal in size. Pericardium: There is no evidence of pericardial effusion. Mitral Valve: The mitral valve is abnormal. Moderate mitral annular calcification. Mild mitral valve regurgitation. No evidence of mitral valve  stenosis. Tricuspid Valve: The tricuspid valve is normal in structure. Tricuspid valve regurgitation is not demonstrated. No evidence of tricuspid stenosis. Aortic Valve: The aortic valve was not  well visualized. Aortic valve regurgitation is severe. Aortic regurgitation PHT measures 723 msec. Mild aortic stenosis is present. Aortic valve mean gradient measures 10.0 mmHg. Aortic valve peak gradient measures 19.1 mmHg. Aortic valve area, by VTI measures 1.75 cm. Pulmonic Valve: The pulmonic valve was not well visualized. Pulmonic valve regurgitation is not visualized. No evidence of pulmonic stenosis. Aorta: Aortic dilatation noted. There is mild dilatation of the aortic root, measuring 43 mm. There is moderate dilatation of the ascending aorta, measuring 49 mm. Venous: The inferior vena cava was not well visualized. IAS/Shunts: The interatrial septum was not assessed.  LEFT VENTRICLE PLAX 2D LVOT diam:     2.10 cm      Diastology LV SV:         73           LV e' medial:    5.76 cm/s LV SV Index:   27           LV E/e' medial:  16.7 LVOT Area:     3.46 cm     LV e' lateral:   7.22 cm/s                             LV E/e' lateral: 13.3  LV Volumes (MOD) LV vol d, MOD A2C: 55.0 ml LV vol d, MOD A4C: 116.0 ml LV vol s, MOD A2C: 29.0 ml LV vol s, MOD A4C: 36.9 ml LV SV MOD A2C:     26.0 ml LV SV MOD A4C:     116.0 ml LV SV MOD BP:      51.7 ml RIGHT VENTRICLE RV S prime:     7.62 cm/s TAPSE (M-mode): 1.3 cm LEFT ATRIUM              Index        RIGHT ATRIUM           Index LA Vol (A2C):   120.0 ml 44.54 ml/m  RA Area:     23.70 cm LA Vol (A4C):   155.0 ml 57.54 ml/m  RA Volume:   60.50 ml  22.46 ml/m LA Biplane Vol: 141.0 ml 52.34 ml/m  AORTIC VALVE AV Area (Vmax):    1.62 cm AV Area (Vmean):   1.57 cm AV Area (VTI):     1.75 cm AV Vmax:           218.50 cm/s AV Vmean:          147.000 cm/s AV VTI:            0.418 m AV Peak Grad:      19.1 mmHg AV Mean Grad:      10.0 mmHg LVOT Vmax:         102.00 cm/s LVOT  Vmean:        66.800 cm/s LVOT VTI:          0.211 m LVOT/AV VTI ratio: 0.51 AI PHT:            723 msec  AORTA Ao Root diam: 4.30 cm Ao Asc diam:  4.90 cm MITRAL VALVE               TRICUSPID VALVE MV Area (PHT): 3.27 cm    TR Peak grad:   23.2 mmHg MV Decel Time: 232 msec    TR Vmax:        241.00 cm/s MR Peak grad: 101.2 mmHg MR Vmax:  503.00 cm/s  SHUNTS MV E velocity: 96.00 cm/s  Systemic VTI:  0.21 m MV A velocity: 74.90 cm/s  Systemic Diam: 2.10 cm MV E/A ratio:  1.28 Vishnu Priya Mallipeddi Electronically signed by Lorelee Cover Mallipeddi Signature Date/Time: 05/28/2022/2:29:00 PM    Final       Scheduled Meds:  apixaban  5 mg Oral BID   furosemide  40 mg Intravenous Daily   sodium chloride flush  3 mL Intravenous Q12H   Continuous Infusions:  sodium chloride       LOS: 1 day   Time spent: 35 minutes   Alma Friendly, MD [facility] 05/29/2022, 6:26 PM   Available via Epic secure chat 7am-7pm After these hours, please refer to coverage provider listed on [email or web address]

## 2022-05-29 NOTE — Consult Note (Signed)
CARDIOLOGY CONSULT NOTE    Patient ID: [name]; [id number]; [birth year]   Admit date: 05/27/2022 Date of Consult: 05/29/2022  Primary Care Provider: Claretta Fraise, MD Primary Cardiologist:  Primary Electrophysiologist:     Patient Profile:   Brett Blankenship is a 89 y.o. male who is being seen today for the evaluation of ADHF/Afib at the request of Dr Horris Latino.  History of Present Illness:   [name] is a 89 year old M known to have HTN, HLD, CKD stage IIIb, presented to the ER with chief complaint of progressive generalized weakness, fatigue and decreased p.o. intake for 3 weeks prior to presentation. Associated with bilateral leg swelling. Patient was admitted to hospitalist team for the management of acute heart failure exacerbation and new incidental finding of atrial fibrillation, rate controlled.  He was placed on IV Lasix with significant relief in his symptoms and resolution of leg swelling symptoms. He was also placed on metoprolol for new onset of atrial fibrillation.  Echocardiogram was performed that showed normal LVEF and eccentric aortic valve regurgitation impinging over the ventricular surface of the anterior mitral valve leaflet consistent with probably severe aortic valve regurgitation (he had moderate AI in 2018) and mild aortic valve stenosis. He also has mild MR. Aortic root is dilated at 43 mm and ascending aorta was dilated at 49 mm. No prior ischemic evaluation.  Past Medical History:  Diagnosis Date   HYPERTENSION 07/12/2009    Past Surgical History:  Procedure Laterality Date   APPENDECTOMY  1960   NECK SURGERY         Inpatient Medications: Scheduled Meds:  apixaban  5 mg Oral BID   furosemide  40 mg Intravenous Daily   sodium chloride flush  3 mL Intravenous Q12H   Continuous Infusions:  sodium chloride     PRN Meds: sodium chloride, acetaminophen **OR** acetaminophen, sodium chloride flush  Allergies:   No Known  Allergies  Social History:   Social History   Socioeconomic History   Marital status: Widowed    Spouse name: Not on file   Number of children: 2   Years of education: 7   Highest education level: 7th grade  Occupational History   Occupation: retired  Tobacco Use   Smoking status: Former    Packs/day: 0.30    Years: 5.00    Total pack years: 1.50    Types: Cigarettes    Quit date: 09/12/1963    Years since quitting: 58.7   Smokeless tobacco: Never  Vaping Use   Vaping Use: Never used  Substance and Sexual Activity   Alcohol use: Not Currently   Drug use: Not Currently   Sexual activity: Not Currently  Other Topics Concern   Not on file  Social History Narrative   Lives alone - daughter in law takes him shopping once a week and out to eat   Grandchildren and son stop by often   Social Determinants of Health   Financial Resource Strain: Low Risk  (03/01/2022)   Overall Financial Resource Strain (CARDIA)    Difficulty of Paying Living Expenses: Not hard at all  Food Insecurity: No Food Insecurity (05/28/2022)   Hunger Vital Sign    Worried About [place] in the Last Year: Never true    [place] in the Last Year: Never true  Transportation Needs: No Transportation Needs (05/28/2022)   PRAPARE - Transportation    Lack of Transportation (Medical): No    Lack of  Transportation (Non-Medical): No  Physical Activity: Sufficiently Active (03/01/2022)   Exercise Vital Sign    Days of Exercise per Week: 5 days    Minutes of Exercise per Session: 30 min  Stress: No Stress Concern Present (03/01/2022)   [place]    Feeling of Stress : Not at all  Social Connections: Moderately Isolated (03/01/2022)   Social Connection and Isolation Panel [NHANES]    Frequency of Communication with Friends and Family: More than three times a week    Frequency of Social Gatherings with Friends and Family: More than  three times a week    Attends Religious Services: More than 4 times per year    Active Member of [employer] or Organizations: No    Attends Archivist Meetings: Never    Marital Status: Widowed  Intimate Partner Violence: Not At Risk (05/28/2022)   Humiliation, Afraid, Rape, and Kick questionnaire    Fear of Current or Ex-Partner: No    Emotionally Abused: No    Physically Abused: No    Sexually Abused: No    Family History:    Family History  Problem Relation Age of Onset   Hypertension Father    Heart disease Father    Cancer Brother        colon, lung     ROS:  Please see the history of present illness.  ROS  All other ROS reviewed and negative.     Physical Exam/Data:   Vitals:   05/28/22 2139 05/29/22 0347 05/29/22 0500 05/29/22 0826  BP: (!) 124/43 (!) 101/44  (!) 126/56  Pulse: (!) 59 60  70  Resp: 18 18    Temp: 98 F (36.7 C) 98.1 F (36.7 C)    TempSrc: Oral Oral    SpO2: 98% 99%    Weight:   76.3 kg   Height:        Intake/Output Summary (Last 24 hours) at 05/29/2022 1658 Last data filed at 05/29/2022 1500 Gross per 24 hour  Intake 720 ml  Output 600 ml  Net 120 ml   Filed Weights   05/27/22 1045 05/27/22 1819 05/29/22 0500  Weight: 82.7 kg (!) 169.4 kg 76.3 kg   Body mass index is 24.84 kg/m.  General:  Well nourished, well developed, in no acute distress HEENT: normal Lymph: no adenopathy Neck: no JVD Endocrine:  No thryomegaly Vascular: No carotid bruits; FA pulses 2+ bilaterally without bruits  Cardiac:  normal S1, S2; RRR; systolic and diastolic murmur Lungs:  clear to auscultation bilaterally, no wheezing, rhonchi or rales  Abd: soft, nontender, no hepatomegaly  Ext: no edema Musculoskeletal:  No deformities, BUE and BLE strength normal and equal Skin: warm and dry  Neuro:  CNs 2-12 intact, no focal abnormalities noted Psych:  Normal affect   EKG:  The EKG was personally reviewed and demonstrates:  AFib Telemetry:  Telemetry  was personally reviewed and demonstrates:  AFib  Laboratory Data:  Chemistry Recent Labs  Lab 05/27/22 1256 05/28/22 0413 05/29/22 0501  NA 134* 136 135  K 3.6 3.0* 3.5  CL 102 103 105  CO2 '25 25 25  '$ GLUCOSE 86 75 80  BUN 35* 33* 37*  CREATININE 1.39* 1.23 1.12  CALCIUM 9.3 9.2 9.1  GFRNONAA 47* 55* >60  ANIONGAP '7 8 5    '$ Recent Labs  Lab 05/27/22 1256  PROT 6.2*  ALBUMIN 2.4*  AST 30  ALT 19  ALKPHOS 40  BILITOT 1.4*   Hematology Recent Labs  Lab 05/27/22 1256 05/28/22 0413  WBC 4.1 3.9*  RBC 3.00* 2.86*  HGB 10.8* 10.3*  HCT 30.6* 29.7*  MCV 102.0* 103.8*  MCH 36.0* 36.0*  MCHC 35.3 34.7  RDW 14.2 13.9  PLT 185 181   Cardiac EnzymesNo results for input(s): "TROPONINI" in the last 168 hours. No results for input(s): "TROPIPOC" in the last 168 hours.  BNP Recent Labs  Lab 05/27/22 1256  BNP 669.0*    DDimer No results for input(s): "DDIMER" in the last 168 hours.  Radiology/Studies:  ECHOCARDIOGRAM COMPLETE  Result Date: 05/28/2022    ECHOCARDIOGRAM REPORT   Patient Name:   KAY RICCIUTI Date of Exam: 05/28/2022 Medical Rec #:  [id number]         Height:       69.0 in Accession #:    [id number]        Weight:       373.5 lb Date of Birth:  [birth year]         BSA:          2.694 m Patient Age:    23 years          BP:           127/66 mmHg Patient Gender: M                 HR:           65 bpm. Exam Location:  [facility] Procedure: 2D Echo, Cardiac Doppler and Color Doppler Indications:    Atrial Fibrillation I48.91  History:        Patient has prior history of Echocardiogram examinations, most                 recent 12/09/2016. CHF, Arrythmias:Atrial Fibrillation; Risk                 Factors:Dyslipidemia and Hypertension.  Sonographer:    Greer Pickerel Referring Phys: [id number] Orma Flaming  Sonographer Comments: Image acquisition challenging due to patient body habitus and Image acquisition challenging due to respiratory motion. IMPRESSIONS  1. The aortic  valve was not well visualized. There is an eccentric aortic valve regurgitation jet impinging along the ventricular surface of anterior mitral valve leaflet resulting in thickening/restricted mobility of anterior mitral valve leaflet with mild mitral valve regurgitation consistent with chronic, probably severe aortic valve regurgitation. Mild aortic valve stenosis. Consider TEE for further evaluation of aortic valve.  2. Aortic dilatation noted. There is mild dilatation of the aortic root, measuring 43 mm. There is moderate dilatation of the ascending aorta, measuring 49 mm.  3. Left ventricular ejection fraction, by estimation, is 60 to 65%. The left ventricle has normal function. Left ventricular endocardial border not optimally defined to evaluate regional wall motion. There is moderate asymmetric left ventricular hypertrophy of the septal and basal segments. Left ventricular diastolic function could not be evaluated.  4. Right ventricular systolic function was not well visualized. The right ventricular size is normal.  5. Left atrial size was severely dilated.  6. The mitral valve is abnormal. Mild mitral valve regurgitation. No evidence of mitral stenosis. Moderate mitral annular calcification. Comparison(s): Prior images unable to be directly viewed, comparison made by report only. Changes from prior study are noted. Normal LVEF and eccentric aortic valve regurgitation likely severe and chronic in duration (worsened from moderate aortic regurgitation from 2018). FINDINGS  Left Ventricle: Left ventricular ejection fraction, by estimation, is 60 to  65%. The left ventricle has normal function. Left ventricular endocardial border not optimally defined to evaluate regional wall motion. The left ventricular internal cavity size was normal in size. There is moderate asymmetric left ventricular hypertrophy of the septal and basal segments. Left ventricular diastolic function could not be evaluated due to atrial  fibrillation. Left ventricular diastolic function could not be evaluated. Right Ventricle: The right ventricular size is normal. No increase in right ventricular wall thickness. Right ventricular systolic function was not well visualized. Left Atrium: Left atrial size was severely dilated. Right Atrium: Right atrial size was normal in size. Pericardium: There is no evidence of pericardial effusion. Mitral Valve: The mitral valve is abnormal. Moderate mitral annular calcification. Mild mitral valve regurgitation. No evidence of mitral valve stenosis. Tricuspid Valve: The tricuspid valve is normal in structure. Tricuspid valve regurgitation is not demonstrated. No evidence of tricuspid stenosis. Aortic Valve: The aortic valve was not well visualized. Aortic valve regurgitation is severe. Aortic regurgitation PHT measures 723 msec. Mild aortic stenosis is present. Aortic valve mean gradient measures 10.0 mmHg. Aortic valve peak gradient measures 19.1 mmHg. Aortic valve area, by VTI measures 1.75 cm. Pulmonic Valve: The pulmonic valve was not well visualized. Pulmonic valve regurgitation is not visualized. No evidence of pulmonic stenosis. Aorta: Aortic dilatation noted. There is mild dilatation of the aortic root, measuring 43 mm. There is moderate dilatation of the ascending aorta, measuring 49 mm. Venous: The inferior vena cava was not well visualized. IAS/Shunts: The interatrial septum was not assessed.  LEFT VENTRICLE PLAX 2D LVOT diam:     2.10 cm      Diastology LV SV:         73           LV e' medial:    5.76 cm/s LV SV Index:   27           LV E/e' medial:  16.7 LVOT Area:     3.46 cm     LV e' lateral:   7.22 cm/s                             LV E/e' lateral: 13.3  LV Volumes (MOD) LV vol d, MOD A2C: 55.0 ml LV vol d, MOD A4C: 116.0 ml LV vol s, MOD A2C: 29.0 ml LV vol s, MOD A4C: 36.9 ml LV SV MOD A2C:     26.0 ml LV SV MOD A4C:     116.0 ml LV SV MOD BP:      51.7 ml RIGHT VENTRICLE RV S prime:     7.62  cm/s TAPSE (M-mode): 1.3 cm LEFT ATRIUM              Index        RIGHT ATRIUM           Index LA Vol (A2C):   120.0 ml 44.54 ml/m  RA Area:     23.70 cm LA Vol (A4C):   155.0 ml 57.54 ml/m  RA Volume:   60.50 ml  22.46 ml/m LA Biplane Vol: 141.0 ml 52.34 ml/m  AORTIC VALVE AV Area (Vmax):    1.62 cm AV Area (Vmean):   1.57 cm AV Area (VTI):     1.75 cm AV Vmax:           218.50 cm/s AV Vmean:          147.000 cm/s AV VTI:  0.418 m AV Peak Grad:      19.1 mmHg AV Mean Grad:      10.0 mmHg LVOT Vmax:         102.00 cm/s LVOT Vmean:        66.800 cm/s LVOT VTI:          0.211 m LVOT/AV VTI ratio: 0.51 AI PHT:            723 msec  AORTA Ao Root diam: 4.30 cm Ao Asc diam:  4.90 cm MITRAL VALVE               TRICUSPID VALVE MV Area (PHT): 3.27 cm    TR Peak grad:   23.2 mmHg MV Decel Time: 232 msec    TR Vmax:        241.00 cm/s MR Peak grad: 101.2 mmHg MR Vmax:      503.00 cm/s  SHUNTS MV E velocity: 96.00 cm/s  Systemic VTI:  0.21 m MV A velocity: 74.90 cm/s  Systemic Diam: 2.10 cm MV E/A ratio:  1.28 Davan Hark Priya Bobie Caris Electronically signed by Lorelee Cover Jada Fass Signature Date/Time: 05/28/2022/2:29:00 PM    Final    DG Chest 2 View  Result Date: 05/27/2022 CLINICAL DATA:  Several month history of bilateral chest pain EXAM: CHEST - 2 VIEW COMPARISON:  Chest radiograph dated 11/21/2017 FINDINGS: Normal lung volumes. No focal consolidations. No pleural effusion or pneumothorax. Annular calcification involving the presumed tricuspid valve. The visualized skeletal structures are unremarkable. IMPRESSION: No active cardiopulmonary disease. Electronically Signed   By: Darrin Nipper M.D.   On: 05/27/2022 11:15    Assessment and Plan:   Patient is a 89 year old M known to have possibly severe aortic valve regurgitation and mild aortic valve stenosis, HTN, HLD, new onset atrial fibrillation is currently admitted to hospitalist team for the management of heart failure exacerbation and new onset  atrial fibrillation, rate controlled  # Acute heart failure exacerbation likely secondary to severe aortic valve regurgitation, currently compensated -Switch IV Lasix to p.o. Lasix 20 mg once daily  # Severe eccentric aortic valve regurgitation likely secondary to aortic root/ascending aortic dilatation # Mild aortic valve stenosis and mild mitral valve regurgitation -Patient is asymptomatic, denies any DOE -Patient is not a candidate for cardiothoracic surgical intervention -Will monitor for symptoms and manage medically -Outpatient cardiology follow-up within 1 month upon discharge  # New onset atrial fibrillation, rate controlled -Discontinue metoprolol in the setting of severe aortic valve regurgitation -Continue Eliquis 5 mg twice daily  I have spent a total of 55 minutes with patient reviewing chart , telemetry, EKGs, labs and examining patient as well as establishing an assessment and plan that was discussed with the patient.  > 50% of time was spent in direct patient care.      For questions or updates, please contact [facility] Please consult [email or web address] for contact info under Cardiology/STEMI.   Signed, Vangie Bicker, MD 05/29/2022 4:58 PM

## 2022-05-30 DIAGNOSIS — I351 Nonrheumatic aortic (valve) insufficiency: Secondary | ICD-10-CM | POA: Diagnosis not present

## 2022-05-30 DIAGNOSIS — I5031 Acute diastolic (congestive) heart failure: Secondary | ICD-10-CM | POA: Diagnosis not present

## 2022-05-30 DIAGNOSIS — I480 Paroxysmal atrial fibrillation: Secondary | ICD-10-CM | POA: Diagnosis not present

## 2022-05-30 DIAGNOSIS — N1832 Chronic kidney disease, stage 3b: Secondary | ICD-10-CM | POA: Diagnosis not present

## 2022-05-30 DIAGNOSIS — I350 Nonrheumatic aortic (valve) stenosis: Secondary | ICD-10-CM

## 2022-05-30 DIAGNOSIS — I4819 Other persistent atrial fibrillation: Secondary | ICD-10-CM | POA: Diagnosis not present

## 2022-05-30 DIAGNOSIS — I509 Heart failure, unspecified: Secondary | ICD-10-CM | POA: Diagnosis not present

## 2022-05-30 MED ORDER — FUROSEMIDE 20 MG PO TABS: 20.0000 mg | ORAL_TABLET | Freq: Every day | ORAL | 0 refills | Status: DC

## 2022-05-30 MED ORDER — AMLODIPINE BESYLATE 5 MG PO TABS: 2.5000 mg | ORAL_TABLET | Freq: Every day | ORAL | 0 refills | Status: DC

## 2022-05-30 NOTE — Progress Notes (Addendum)
Progress Note  Patient Name: Brett Blankenship Date of Encounter: 05/30/2022  Primary Cardiologist: None  Subjective   No acute events overnight. Telemetry reviewed, atrial fibrillation and rate controlled  Inpatient Medications    Scheduled Meds:  amLODipine  5 mg Oral Daily   apixaban  5 mg Oral BID   furosemide  20 mg Oral Daily   sodium chloride flush  3 mL Intravenous Q12H   Continuous Infusions:  sodium chloride     PRN Meds: sodium chloride, acetaminophen **OR** acetaminophen, sodium chloride flush   Vital Signs    Vitals:   05/29/22 0826 05/29/22 2041 05/30/22 0352 05/30/22 0500  BP: (!) 126/56 (!) 105/51 114/66   Pulse: 70 62 62   Resp:  14 20   Temp:  (!) 97.5 F (36.4 C) 97.9 F (36.6 C)   TempSrc:  Oral Oral   SpO2:  100% 100%   Weight:    76.2 kg  Height:        Intake/Output Summary (Last 24 hours) at 05/30/2022 0955 Last data filed at 05/30/2022 0900 Gross per 24 hour  Intake 720 ml  Output 450 ml  Net 270 ml   Filed Weights   05/27/22 1819 05/29/22 0500 05/30/22 0500  Weight: (!) 169.4 kg 76.3 kg 76.2 kg    Telemetry    Personally reviewed, atrial fibrillation and rate controlled.  ECG    An ECG dated 05/28/2022 was personally reviewed today and demonstrated:  Atrial fibrillation, rate controlled  Physical Exam   GEN: No acute distress.   Neck: No JVD. Cardiac: RRR, no murmur, rub, or gallop.  Respiratory: Nonlabored. Clear to auscultation bilaterally. GI: Soft, nontender, bowel sounds present. MS: No edema; No deformity. Neuro:  Nonfocal. Psych: Alert and oriented x 3. Normal affect.  Labs    Chemistry Recent Labs  Lab 05/27/22 1256 05/28/22 0413 05/29/22 0501  NA 134* 136 135  K 3.6 3.0* 3.5  CL 102 103 105  CO2 '25 25 25  '$ GLUCOSE 86 75 80  BUN 35* 33* 37*  CREATININE 1.39* 1.23 1.12  CALCIUM 9.3 9.2 9.1  PROT 6.2*  --   --   ALBUMIN 2.4*  --   --   AST 30  --   --   ALT 19  --   --   ALKPHOS 40  --   --    BILITOT 1.4*  --   --   GFRNONAA 47* 55* >60  ANIONGAP '7 8 5     '$ Hematology Recent Labs  Lab 05/27/22 1256 05/28/22 0413  WBC 4.1 3.9*  RBC 3.00* 2.86*  HGB 10.8* 10.3*  HCT 30.6* 29.7*  MCV 102.0* 103.8*  MCH 36.0* 36.0*  MCHC 35.3 34.7  RDW 14.2 13.9  PLT 185 181    Cardiac Enzymes Recent Labs  Lab 05/27/22 1256 05/27/22 1641  TROPONINIHS 48* 49*    BNP Recent Labs  Lab 05/27/22 1256  BNP 669.0*     DDimerNo results for input(s): "DDIMER" in the last 168 hours.   Radiology    ECHOCARDIOGRAM COMPLETE  Result Date: 05/28/2022    ECHOCARDIOGRAM REPORT   Patient Name:   Brett Blankenship Date of Exam: 05/28/2022 Medical Rec #:  [id number]         Height:       69.0 in Accession #:    [id number]        Weight:       373.5 lb Date of Birth:  21-Feb-1927         BSA:          2.694 m Patient Age:    89 years          BP:           127/66 mmHg Patient Gender: M                 HR:           65 bpm. Exam Location:  [facility] Procedure: 2D Echo, Cardiac Doppler and Color Doppler Indications:    Atrial Fibrillation I48.91  History:        Patient has prior history of Echocardiogram examinations, most                 recent 12/09/2016. CHF, Arrythmias:Atrial Fibrillation; Risk                 Factors:Dyslipidemia and Hypertension.  Sonographer:    Greer Pickerel Referring Phys: [id number] Orma Flaming  Sonographer Comments: Image acquisition challenging due to patient body habitus and Image acquisition challenging due to respiratory motion. IMPRESSIONS  1. The aortic valve was not well visualized. There is an eccentric aortic valve regurgitation jet impinging along the ventricular surface of anterior mitral valve leaflet resulting in thickening/restricted mobility of anterior mitral valve leaflet with mild mitral valve regurgitation consistent with chronic, probably severe aortic valve regurgitation. Mild aortic valve stenosis. Consider TEE for further evaluation of aortic valve.  2.  Aortic dilatation noted. There is mild dilatation of the aortic root, measuring 43 mm. There is moderate dilatation of the ascending aorta, measuring 49 mm.  3. Left ventricular ejection fraction, by estimation, is 60 to 65%. The left ventricle has normal function. Left ventricular endocardial border not optimally defined to evaluate regional wall motion. There is moderate asymmetric left ventricular hypertrophy of the septal and basal segments. Left ventricular diastolic function could not be evaluated.  4. Right ventricular systolic function was not well visualized. The right ventricular size is normal.  5. Left atrial size was severely dilated.  6. The mitral valve is abnormal. Mild mitral valve regurgitation. No evidence of mitral stenosis. Moderate mitral annular calcification. Comparison(s): Prior images unable to be directly viewed, comparison made by report only. Changes from prior study are noted. Normal LVEF and eccentric aortic valve regurgitation likely severe and chronic in duration (worsened from moderate aortic regurgitation from 2018). FINDINGS  Left Ventricle: Left ventricular ejection fraction, by estimation, is 60 to 65%. The left ventricle has normal function. Left ventricular endocardial border not optimally defined to evaluate regional wall motion. The left ventricular internal cavity size was normal in size. There is moderate asymmetric left ventricular hypertrophy of the septal and basal segments. Left ventricular diastolic function could not be evaluated due to atrial fibrillation. Left ventricular diastolic function could not be evaluated. Right Ventricle: The right ventricular size is normal. No increase in right ventricular wall thickness. Right ventricular systolic function was not well visualized. Left Atrium: Left atrial size was severely dilated. Right Atrium: Right atrial size was normal in size. Pericardium: There is no evidence of pericardial effusion. Mitral Valve: The mitral  valve is abnormal. Moderate mitral annular calcification. Mild mitral valve regurgitation. No evidence of mitral valve stenosis. Tricuspid Valve: The tricuspid valve is normal in structure. Tricuspid valve regurgitation is not demonstrated. No evidence of tricuspid stenosis. Aortic Valve: The aortic valve was not well visualized. Aortic valve regurgitation is severe. Aortic regurgitation PHT measures 723 msec.  Mild aortic stenosis is present. Aortic valve mean gradient measures 10.0 mmHg. Aortic valve peak gradient measures 19.1 mmHg. Aortic valve area, by VTI measures 1.75 cm. Pulmonic Valve: The pulmonic valve was not well visualized. Pulmonic valve regurgitation is not visualized. No evidence of pulmonic stenosis. Aorta: Aortic dilatation noted. There is mild dilatation of the aortic root, measuring 43 mm. There is moderate dilatation of the ascending aorta, measuring 49 mm. Venous: The inferior vena cava was not well visualized. IAS/Shunts: The interatrial septum was not assessed.  LEFT VENTRICLE PLAX 2D LVOT diam:     2.10 cm      Diastology LV SV:         73           LV e' medial:    5.76 cm/s LV SV Index:   27           LV E/e' medial:  16.7 LVOT Area:     3.46 cm     LV e' lateral:   7.22 cm/s                             LV E/e' lateral: 13.3  LV Volumes (MOD) LV vol d, MOD A2C: 55.0 ml LV vol d, MOD A4C: 116.0 ml LV vol s, MOD A2C: 29.0 ml LV vol s, MOD A4C: 36.9 ml LV SV MOD A2C:     26.0 ml LV SV MOD A4C:     116.0 ml LV SV MOD BP:      51.7 ml RIGHT VENTRICLE RV S prime:     7.62 cm/s TAPSE (M-mode): 1.3 cm LEFT ATRIUM              Index        RIGHT ATRIUM           Index LA Vol (A2C):   120.0 ml 44.54 ml/m  RA Area:     23.70 cm LA Vol (A4C):   155.0 ml 57.54 ml/m  RA Volume:   60.50 ml  22.46 ml/m LA Biplane Vol: 141.0 ml 52.34 ml/m  AORTIC VALVE AV Area (Vmax):    1.62 cm AV Area (Vmean):   1.57 cm AV Area (VTI):     1.75 cm AV Vmax:           218.50 cm/s AV Vmean:          147.000 cm/s AV  VTI:            0.418 m AV Peak Grad:      19.1 mmHg AV Mean Grad:      10.0 mmHg LVOT Vmax:         102.00 cm/s LVOT Vmean:        66.800 cm/s LVOT VTI:          0.211 m LVOT/AV VTI ratio: 0.51 AI PHT:            723 msec  AORTA Ao Root diam: 4.30 cm Ao Asc diam:  4.90 cm MITRAL VALVE               TRICUSPID VALVE MV Area (PHT): 3.27 cm    TR Peak grad:   23.2 mmHg MV Decel Time: 232 msec    TR Vmax:        241.00 cm/s MR Peak grad: 101.2 mmHg MR Vmax:      503.00 cm/s  SHUNTS MV E velocity: 96.00 cm/s  Systemic  VTI:  0.21 m MV A velocity: 74.90 cm/s  Systemic Diam: 2.10 cm MV E/A ratio:  1.28 Aziah Kaiser Priya Tatem Holsonback Electronically signed by Lorelee Cover Kamaria Lucia Signature Date/Time: 05/28/2022/2:29:00 PM    Final     Cardiac Studies   Echo from 05/28/2022 LVEF 6065% Severe AI possibly Mild AS Mild MR  Assessment & Plan   Patient is a 89 year old M known to have possibly severe aortic valve regurgitation and mild aortic valve stenosis, HTN, HLD, new onset atrial fibrillation is currently admitted to hospitalist team for the management of heart failure exacerbation and new onset atrial fibrillation, rate controlled   # Acute heart failure exacerbation likely secondary to severe aortic valve regurgitation, currently compensated  -Continue p.o. Lasix 20 mg once daily.  Can take additional dose if he feels SOB/LE swelling/weight gain.  # Severe eccentric aortic valve regurgitation likely secondary to aortic root/ascending aortic dilatation # Mild aortic valve stenosis and mild mitral valve regurgitation -Patient is asymptomatic, denies any DOE -Patient is not a surgical candidate -Will monitor for symptoms and manage medically -Outpatient cardiology follow-up within 1 month upon discharge  # New onset atrial fibrillation, rate controlled -Metoprolol is discontinued in the setting of severe AI -Hold Eliquis. Eliquis held due to fall risk (hit his head in 2021) -Cardiology outpatient follow-up  in 1 month upon discharge. If he continues to feel fatigue/generalized weakness after discharge, he might benefit from outpatient DCCV.  [facility] will sign off.   Medication Recommendations: Continue p.o. Lasix 20 mg once daily (can take additional dose if SOB/LE swelling/weight gain), amlodipine 2.5 mg once daily (decreased dose). Discontinue triamterene-HCTZ upon discharge. Other recommendations (labs, testing, etc): None Follow up as an outpatient: Cardiology follow-up within 2 weeks upon discharge   I have spent a total of 33 minutes with patient reviewing chart , telemetry, EKGs, labs and examining patient as well as establishing an assessment and plan that was discussed with the patient.  > 50% of time was spent in direct patient care.     Signed, Chalmers Guest, MD  05/30/2022, 9:55 AM

## 2022-05-30 NOTE — TOC Transition Note (Signed)
Transition of Care [facility]) - CM/SW Discharge Note   Patient Details  Name: Brett Blankenship MRN: [id number] Date of Birth: 07/24/1926  Transition of Care [facility]) CM/SW Contact:  Shade Flood, LCSW Phone Number: 05/30/2022, 12:00 PM   Clinical Narrative:     Pt stable for dc home with [facility] today per MD. Updated Caryl Pina at [facility] and they will follow up with pt at home.  There are no other TOC needs for dc.  Final next level of care: Home w Home Health Services Barriers to Discharge: Barriers Resolved   Patient Goals and CMS Choice CMS Medicare.gov Compare Post Acute Care list provided to:: Patient Choice offered to / list presented to : Patient  Discharge Placement                         Discharge Plan and Services Additional resources added to the After Visit Summary for   In-house Referral: Clinical Social Work              DME Arranged: Gilford Rile rolling DME Agency: [employer] Date DME Agency Contacted: 05/28/22 Time DME Agency Contacted: [id number] Representative spoke with at DME Agency: Erasmo Downer HH Arranged: PT, OT [place] Agency: [place] ([place]) Date [place]: 05/28/22 Time [place]: [phone] Representative spoke with at [place]: [place] ([place]) Interventions [place]: No Food Insecurity (05/28/2022)  Housing: Low Risk  (05/28/2022)  Transportation Needs: No Transportation Needs (05/28/2022)  Utilities: Not At Risk (05/28/2022)  Alcohol Screen: Low Risk  (03/01/2022)  Depression (PHQ2-9): High Risk (05/23/2022)  Financial Resource Strain: Low Risk  (03/01/2022)  Physical Activity: Sufficiently Active (03/01/2022)  Social Connections: Moderately Isolated (03/01/2022)  Stress: No Stress Concern Present (03/01/2022)  Tobacco Use: Medium Risk (05/27/2022)     Readmission Risk Interventions     No data to display

## 2022-05-30 NOTE — Discharge Summary (Signed)
Physician Discharge Summary   Patient: Brett Blankenship MRN: [id number] DOB: October 17, 1926  Admit date:     05/27/2022  Discharge date: 05/30/22  Discharge Physician: Alma Friendly   PCP: Claretta Fraise, MD   Recommendations at discharge:   Follow-up with PCP in 1 week Follow-up with cardiology as scheduled  Discharge Diagnoses: Principal Problem:   Acute exacerbation of CHF (congestive heart failure) (HCC) Active Problems:   Paroxysmal atrial fibrillation (HCC)   Elevated troponin   Macrocytic anemia   Stage 3b chronic kidney disease (CKD) (HCC)   Essential hypertension   Mixed hyperlipidemia   Acute CHF (congestive heart failure) [facility])   Hospital Course: Brett Blankenship is a 89 y.o. male with medical history significant of HTN, HLD, anemia, CKD stage 3b who presented to ED with complaints of cough/congestion. He was given a course of doxycycline on 05/17/22 with no improvement.  He has been complaining of generalized fatigue as well. He denies any shortness of breath. He has had leg swelling x 2 years, worse on the left. He denies orthopnea or dyspnea on exertion. He denies any chest pain or palpitations. He has gained some weight, 2-3 pounds. He states his symptoms started about 2 months ago with fatigue and decreased PO intake. 3-4 weeks ago he got out of bed and fell trying to do a step up into another room. He had a rib fracture from this.  Patient continued to do poorly and presented to the ED. In the ED, patient was found to have new onset A-fib, rate controlled.  Exam also suggestive of CHF exacerbation.  He was given IV Lasix and admitted to the hospital.    Today, patient denies any new complaints, educated patient on the need to ambulate with a walker to prevent recurrent falls.  Discussed extensively with patient's son about discharge plans and follow-up care, verbalized understanding.   Assessment and Plan:  Acute CHF exacerbation likely 2/2 severe aortic valve  regurgitation BNP 669, with peripheral edema on presentation Echo showed EF of 60 to 65%, unable to evaluate regional wall motion abnormality, left ventricular diastolic function could not be evaluated likely due to A-fib Noted possible severe aortic valve regurgitation Cardiology consulted, not a surgical candidate for valve replacement, recommend medical management Switched Lasix to 20 mg once daily Outpatient cardiology follow-up within 1 month upon discharge   New onset A Fib Rate controlled Cardiology consulted, given history of severe aortic valve regurgitation, metoprolol discontinued CHA2DS2-VASC score of 4 Holding Eliquis as per cardiology, due to history of fall in 2021 with laceration to the scalp Follow-up with cardiology as an outpatient for further management   Elevated troponin Demand ischemia Troponin 48, 49 Due to above   CKD stage 3b Baseline Cr 1.4   HTN BP currently stable Patient on amlodipine 10 mg, triamterene-HCTZ PTA Will restart amlodipine at 2.5 mg and discontinue triamterene-HCTZ upon discharge     Consultants: Cardiology Procedures performed: None Disposition: Home health  Diet recommendation:  Discharge Diet Orders (From admission, onward)     Start     Ordered   05/30/22 0000  Diet - low sodium heart healthy        05/30/22 1151           Cardiac diet DISCHARGE MEDICATION: Allergies as of 05/30/2022   No Known Allergies      Medication List     STOP taking these medications    doxycycline 100 MG tablet Commonly known as: VIBRA-TABS  fexofenadine 180 MG tablet Commonly known as: ALLEGRA   pseudoephedrine-guaifenesin 60-600 MG 12 hr tablet Commonly known as: MUCINEX D   triamterene-hydrochlorothiazide 37.5-25 MG tablet Commonly known as: MAXZIDE-25       TAKE these medications    acetaminophen 650 MG CR tablet Commonly known as: TYLENOL Take 650 mg by mouth every 8 (eight) hours as needed for pain.   amLODipine  5 MG tablet Commonly known as: NORVASC Take 0.5 tablets (2.5 mg total) by mouth daily. What changed:  medication strength See the new instructions.   EYE VITAMINS & MINERALS PO Take by mouth. [facility]   fenofibrate 160 MG tablet TAKE 1 TABLET BY MOUTH ONCE A DAY.   fish oil-omega-3 fatty acids 1000 MG capsule Take 2 g by mouth daily.   furosemide 20 MG tablet Commonly known as: LASIX Take 1 tablet (20 mg total) by mouth daily. Start taking on: May 31, 2022   nabumetone 500 MG tablet Commonly known as: RELAFEN TAKE 2 TABLETS BY MOUTH TWICE DAILY AS NEEDED FOR PAIN. What changed: See the new instructions.   nitroGLYCERIN 0.3 MG SL tablet Commonly known as: NITROSTAT Place 1 tablet (0.3 mg total) under the tongue every 5 (five) minutes as needed for chest pain.   Vitamin D (Ergocalciferol) 50000 units Caps Take 1 capsule (50,000 Units total) by mouth every 7 (seven) days.               Durable Medical Equipment  (From admission, onward)           Start     Ordered   05/28/22 1236  For home use only DME Walker rolling  Once       Comments: Patient safer using RW for longer distance due to weakness and demonstrates good return for use  Question Answer Comment  Walker: With [place]   Patient needs a walker to treat with the following condition Gait difficulty      05/28/22 1236   05/28/22 1137  For home use only DME Walker rolling  Once       Comments: Bariatric Walker needed  weight 373 lbs  Question Answer Comment  Walker: With 5 Inch Wheels   Patient needs a walker to treat with the following condition CHF (congestive heart failure) ([place])      05/28/22 1137            Follow-up Information     [facility].   Specialty: Home Health Services Why: Will contact you to schedule home heath visits.        Claretta Fraise, MD. Schedule an appointment as soon as possible for a visit in 1 week(s).   Specialty:  Family Medicine Contact information: [place] [place] [address] [phone]                Discharge Exam: Danley Danker Weights   05/27/22 1819 05/29/22 0500 05/30/22 0500  Weight: (!) 169.4 kg 76.3 kg 76.2 kg   General: NAD  Cardiovascular: S1, S2 present Respiratory: CTAB Abdomen: Soft, nontender, nondistended, bowel sounds present Musculoskeletal: No bilateral pedal edema noted Skin: Normal Psychiatry: Normal mood   Condition at discharge: stable  The results of significant diagnostics from this hospitalization (including imaging, microbiology, ancillary and laboratory) are listed below for reference.   Imaging Studies: ECHOCARDIOGRAM COMPLETE  Result Date: 05/28/2022    ECHOCARDIOGRAM REPORT   Patient Name:   ESWIN WORRELL Date of Exam: 05/28/2022 Medical Rec #:  [id number]         Height:       69.0 in Accession #:    [id number]        Weight:       373.5 lb Date of Birth:  28-May-1926         BSA:          2.694 m Patient Age:    89 years          BP:           127/66 mmHg Patient Gender: M                 HR:           65 bpm. Exam Location:  [facility] Procedure: 2D Echo, Cardiac Doppler and Color Doppler Indications:    Atrial Fibrillation I48.91  History:        Patient has prior history of Echocardiogram examinations, most                 recent 12/09/2016. CHF, Arrythmias:Atrial Fibrillation; Risk                 Factors:Dyslipidemia and Hypertension.  Sonographer:    Greer Pickerel Referring Phys: [id number] Orma Flaming  Sonographer Comments: Image acquisition challenging due to patient body habitus and Image acquisition challenging due to respiratory motion. IMPRESSIONS  1. The aortic valve was not well visualized. There is an eccentric aortic valve regurgitation jet impinging along the ventricular surface of anterior mitral valve leaflet resulting in thickening/restricted mobility of anterior mitral valve leaflet with mild mitral valve regurgitation consistent with  chronic, probably severe aortic valve regurgitation. Mild aortic valve stenosis. Consider TEE for further evaluation of aortic valve.  2. Aortic dilatation noted. There is mild dilatation of the aortic root, measuring 43 mm. There is moderate dilatation of the ascending aorta, measuring 49 mm.  3. Left ventricular ejection fraction, by estimation, is 60 to 65%. The left ventricle has normal function. Left ventricular endocardial border not optimally defined to evaluate regional wall motion. There is moderate asymmetric left ventricular hypertrophy of the septal and basal segments. Left ventricular diastolic function could not be evaluated.  4. Right ventricular systolic function was not well visualized. The right ventricular size is normal.  5. Left atrial size was severely dilated.  6. The mitral valve is abnormal. Mild mitral valve regurgitation. No evidence of mitral stenosis. Moderate mitral annular calcification. Comparison(s): Prior images unable to be directly viewed, comparison made by report only. Changes from prior study are noted. Normal LVEF and eccentric aortic valve regurgitation likely severe and chronic in duration (worsened from moderate aortic regurgitation from 2018). FINDINGS  Left Ventricle: Left ventricular ejection fraction, by estimation, is 60 to 65%. The left ventricle has normal function. Left ventricular endocardial border not optimally defined to evaluate regional wall motion. The left ventricular internal cavity size was normal in size. There is moderate asymmetric left ventricular hypertrophy of the septal and basal segments. Left ventricular diastolic function could not be evaluated due to atrial fibrillation. Left ventricular diastolic function could not be evaluated. Right Ventricle: The right ventricular size is normal. No increase in right ventricular wall thickness. Right ventricular systolic function was not well visualized. Left Atrium: Left atrial size was severely dilated.  Right Atrium: Right atrial size was normal in size. Pericardium: There is no evidence of pericardial effusion. Mitral Valve: The mitral valve is abnormal. Moderate mitral annular calcification. Mild mitral valve regurgitation. No evidence  of mitral valve stenosis. Tricuspid Valve: The tricuspid valve is normal in structure. Tricuspid valve regurgitation is not demonstrated. No evidence of tricuspid stenosis. Aortic Valve: The aortic valve was not well visualized. Aortic valve regurgitation is severe. Aortic regurgitation PHT measures 723 msec. Mild aortic stenosis is present. Aortic valve mean gradient measures 10.0 mmHg. Aortic valve peak gradient measures 19.1 mmHg. Aortic valve area, by VTI measures 1.75 cm. Pulmonic Valve: The pulmonic valve was not well visualized. Pulmonic valve regurgitation is not visualized. No evidence of pulmonic stenosis. Aorta: Aortic dilatation noted. There is mild dilatation of the aortic root, measuring 43 mm. There is moderate dilatation of the ascending aorta, measuring 49 mm. Venous: The inferior vena cava was not well visualized. IAS/Shunts: The interatrial septum was not assessed.  LEFT VENTRICLE PLAX 2D LVOT diam:     2.10 cm      Diastology LV SV:         73           LV e' medial:    5.76 cm/s LV SV Index:   27           LV E/e' medial:  16.7 LVOT Area:     3.46 cm     LV e' lateral:   7.22 cm/s                             LV E/e' lateral: 13.3  LV Volumes (MOD) LV vol d, MOD A2C: 55.0 ml LV vol d, MOD A4C: 116.0 ml LV vol s, MOD A2C: 29.0 ml LV vol s, MOD A4C: 36.9 ml LV SV MOD A2C:     26.0 ml LV SV MOD A4C:     116.0 ml LV SV MOD BP:      51.7 ml RIGHT VENTRICLE RV S prime:     7.62 cm/s TAPSE (M-mode): 1.3 cm LEFT ATRIUM              Index        RIGHT ATRIUM           Index LA Vol (A2C):   120.0 ml 44.54 ml/m  RA Area:     23.70 cm LA Vol (A4C):   155.0 ml 57.54 ml/m  RA Volume:   60.50 ml  22.46 ml/m LA Biplane Vol: 141.0 ml 52.34 ml/m  AORTIC VALVE AV Area  (Vmax):    1.62 cm AV Area (Vmean):   1.57 cm AV Area (VTI):     1.75 cm AV Vmax:           218.50 cm/s AV Vmean:          147.000 cm/s AV VTI:            0.418 m AV Peak Grad:      19.1 mmHg AV Mean Grad:      10.0 mmHg LVOT Vmax:         102.00 cm/s LVOT Vmean:        66.800 cm/s LVOT VTI:          0.211 m LVOT/AV VTI ratio: 0.51 AI PHT:            723 msec  AORTA Ao Root diam: 4.30 cm Ao Asc diam:  4.90 cm MITRAL VALVE               TRICUSPID VALVE MV Area (PHT): 3.27 cm    TR Peak grad:  23.2 mmHg MV Decel Time: 232 msec    TR Vmax:        241.00 cm/s MR Peak grad: 101.2 mmHg MR Vmax:      503.00 cm/s  SHUNTS MV E velocity: 96.00 cm/s  Systemic VTI:  0.21 m MV A velocity: 74.90 cm/s  Systemic Diam: 2.10 cm MV E/A ratio:  1.28 Vishnu Priya Mallipeddi Electronically signed by Lorelee Cover Mallipeddi Signature Date/Time: 05/28/2022/2:29:00 PM    Final    DG Chest 2 View  Result Date: 05/27/2022 CLINICAL DATA:  Several month history of bilateral chest pain EXAM: CHEST - 2 VIEW COMPARISON:  Chest radiograph dated 11/21/2017 FINDINGS: Normal lung volumes. No focal consolidations. No pleural effusion or pneumothorax. Annular calcification involving the presumed tricuspid valve. The visualized skeletal structures are unremarkable. IMPRESSION: No active cardiopulmonary disease. Electronically Signed   By: Darrin Nipper M.D.   On: 05/27/2022 11:15   DG Wrist Complete Left  Result Date: 05/04/2022 CLINICAL DATA:  Pain along the ulnar side of the wrist after fall. EXAM: LEFT WRIST - COMPLETE 3+ VIEW COMPARISON:  None Available. FINDINGS: The distal radius and ulna are intact. Soft tissue swelling is identified adjacent to the distal ulna, triquetrum, and pisiform. On oblique imaging, there is an age indeterminate lucency, probably through the triquetrum. No other suspicious abnormalities identified. IMPRESSION: 1. Soft tissue swelling adjacent to the distal ulna, triquetrum, and pisiform. 2. Age-indeterminate lucency  through the triquetrum on oblique imaging. Given overlapping soft tissue swelling and pain in this region, an acute fracture is not excluded. 3. No other suspicious abnormalities. Electronically Signed   By: Dorise Bullion III M.D.   On: 05/04/2022 13:53    Microbiology: Results for orders placed or performed during the hospital encounter of 05/27/22  Resp panel by RT-PCR (RSV, Flu A&B, Covid) Anterior Nasal Swab     Status: None   Collection Time: 05/27/22 10:53 AM   Specimen: Anterior Nasal Swab  Result Value Ref Range Status   SARS Coronavirus 2 by RT PCR NEGATIVE NEGATIVE Final    Comment: (NOTE) SARS-CoV-2 target nucleic acids are NOT DETECTED.  The SARS-CoV-2 RNA is generally detectable in upper respiratory specimens during the acute phase of infection. The lowest concentration of SARS-CoV-2 viral copies this assay can detect is 138 copies/mL. A negative result does not preclude SARS-Cov-2 infection and should not be used as the sole basis for treatment or other patient management decisions. A negative result may occur with  improper specimen collection/handling, submission of specimen other than nasopharyngeal swab, presence of viral mutation(s) within the areas targeted by this assay, and inadequate number of viral copies(<138 copies/mL). A negative result must be combined with clinical observations, patient history, and epidemiological information. The expected result is Negative.  Fact Sheet for Patients:  EntrepreneurPulse.com.au  Fact Sheet for Healthcare Providers:  IncredibleEmployment.be  This test is no t yet approved or cleared by the [place] FDA and  has been authorized for detection and/or diagnosis of SARS-CoV-2 by FDA under an Emergency Use Authorization (EUA). This EUA will remain  in effect (meaning this test can be used) for the duration of the COVID-19 declaration under Section 564(b)(1) of the Act,  21 U.S.C.section 360bbb-3(b)(1), unless the authorization is terminated  or revoked sooner.       Influenza A by PCR NEGATIVE NEGATIVE Final   Influenza B by PCR NEGATIVE NEGATIVE Final    Comment: (NOTE) The Xpert Xpress SARS-CoV-2/FLU/RSV plus assay is intended as an aid in  the diagnosis of influenza from Nasopharyngeal swab specimens and should not be used as a sole basis for treatment. Nasal washings and aspirates are unacceptable for Xpert Xpress SARS-CoV-2/FLU/RSV testing.  Fact Sheet for Patients: EntrepreneurPulse.com.au  Fact Sheet for Healthcare Providers: IncredibleEmployment.be  This test is not yet approved or cleared by the [place] FDA and has been authorized for detection and/or diagnosis of SARS-CoV-2 by FDA under an Emergency Use Authorization (EUA). This EUA will remain in effect (meaning this test can be used) for the duration of the COVID-19 declaration under Section 564(b)(1) of the Act, 21 U.S.C. section 360bbb-3(b)(1), unless the authorization is terminated or revoked.     Resp Syncytial Virus by PCR NEGATIVE NEGATIVE Final    Comment: (NOTE) Fact Sheet for Patients: EntrepreneurPulse.com.au  Fact Sheet for Healthcare Providers: IncredibleEmployment.be  This test is not yet approved or cleared by the [place] FDA and has been authorized for detection and/or diagnosis of SARS-CoV-2 by FDA under an Emergency Use Authorization (EUA). This EUA will remain in effect (meaning this test can be used) for the duration of the COVID-19 declaration under Section 564(b)(1) of the Act, 21 U.S.C. section 360bbb-3(b)(1), unless the authorization is terminated or revoked.  Performed at [facility], [address], [place], [place] [address]   Respiratory (~20 pathogens) panel by PCR     Status: None   Collection Time: 05/27/22  3:39 PM   Specimen: Nasopharyngeal Swab; Respiratory   Result Value Ref Range Status   Adenovirus NOT DETECTED NOT DETECTED Final   Coronavirus 229E NOT DETECTED NOT DETECTED Final    Comment: (NOTE) The Coronavirus on the Respiratory Panel, DOES NOT test for the novel  Coronavirus (2019 nCoV)    Coronavirus HKU1 NOT DETECTED NOT DETECTED Final   Coronavirus NL63 NOT DETECTED NOT DETECTED Final   Coronavirus OC43 NOT DETECTED NOT DETECTED Final   Metapneumovirus NOT DETECTED NOT DETECTED Final   Rhinovirus / Enterovirus NOT DETECTED NOT DETECTED Final   Influenza A NOT DETECTED NOT DETECTED Final   Influenza B NOT DETECTED NOT DETECTED Final   Parainfluenza Virus 1 NOT DETECTED NOT DETECTED Final   Parainfluenza Virus 2 NOT DETECTED NOT DETECTED Final   Parainfluenza Virus 3 NOT DETECTED NOT DETECTED Final   Parainfluenza Virus 4 NOT DETECTED NOT DETECTED Final   Respiratory Syncytial Virus NOT DETECTED NOT DETECTED Final   Bordetella pertussis NOT DETECTED NOT DETECTED Final   Bordetella Parapertussis NOT DETECTED NOT DETECTED Final   Chlamydophila pneumoniae NOT DETECTED NOT DETECTED Final   Mycoplasma pneumoniae NOT DETECTED NOT DETECTED Final    Comment: Performed at [facility], [address] [place], [place] [address]    Labs: CBC: Recent Labs  Lab 05/27/22 1256 05/28/22 0413  WBC 4.1 3.9*  NEUTROABS 2.3  --   HGB 10.8* 10.3*  HCT 30.6* 29.7*  MCV 102.0* 103.8*  PLT 185 850   Basic Metabolic Panel: Recent Labs  Lab 05/27/22 1256 05/28/22 0413 05/29/22 0501  NA 134* 136 135  K 3.6 3.0* 3.5  CL 102 103 105  CO2 '25 25 25  '$ GLUCOSE 86 75 80  BUN 35* 33* 37*  CREATININE 1.39* 1.23 1.12  CALCIUM 9.3 9.2 9.1  MG 1.8  --  1.9   Liver Function Tests: Recent Labs  Lab 05/27/22 1256  AST 30  ALT 19  ALKPHOS 40  BILITOT 1.4*  PROT 6.2*  ALBUMIN 2.4*   CBG: No results for input(s): "GLUCAP" in the  last 168 hours.  Discharge time spent: greater than 30 minutes.  Signed: Alma Friendly,  MD [facility] 05/30/2022

## 2022-05-30 NOTE — Progress Notes (Signed)
Physical Therapy Treatment Patient Details Name: Brett Blankenship MRN: [id number] DOB: May 29, 1926 Today's Date: 05/30/2022   History of Present Illness Brett Blankenship is a 89 y.o. male with medical history significant of HTN, HLD, anemia, CKD stage 3b who presented to ED with complaints of cough/congestion. He was given a course of doxycycline on 05/17/22 with no improvement.  He has been complaining of generalized fatigue as well. He denies any shortness of breath. He has had leg swelling x 2 years, worse on the left. He denies orthopnea or dyspnea on exertion. He denies any chest pain or palpitations. He has gained some weight, 2-3 pounds.    PT Comments    Patient presents supine in bed. He is awake, alert and cooperative. Patient eager to mobilize and ambulate. Patient able to sit up at bed side with set up assist. Performed LE strengthening exercise seated at EOB. Patient able to stand from bed Mod I, but requires UE support using RW for stability. Patient able to march in place using RW. Patient able to ambulate 100 feet into hallway with CG, and demos no LOB using RW. Patient reports increased LE fatigue and returned to room, where he was placed in bed. HE required Min A for repositioning to head of bed. Patient left seated upright in bed, with phone and call bell in reach. Patient will benefit from continued physical therapy in hospital and recommended venue below to increase strength, balance, endurance for safe ADLs and gait.     Recommendations for follow up therapy are one component of a multi-disciplinary discharge planning process, led by the attending physician.  Recommendations may be updated based on patient status, additional functional criteria and insurance authorization.  Follow Up Recommendations  Home health PT     Assistance Recommended at Discharge Set up Supervision/Assistance  Patient can return home with the following A little help with walking and/or transfers;A  little help with bathing/dressing/bathroom;Help with stairs or ramp for entrance;Assistance with cooking/housework   Equipment Recommendations  Rolling walker (2 wheels)    Recommendations for Other Services       Precautions / Restrictions Precautions Precautions: Fall Restrictions Weight Bearing Restrictions: No     Mobility  Bed Mobility Overal bed mobility: Modified Independent                  Transfers Overall transfer level: Modified independent Equipment used: Rolling walker (2 wheels) Transfers: Sit to/from Stand Sit to Stand: Supervision                Ambulation/Gait Ambulation/Gait assistance: Supervision, Min guard Gait Distance (Feet): 100 Feet Assistive device: Rolling walker (2 wheels) Gait Pattern/deviations: Decreased step length - right, Decreased step length - left, Decreased stride length Gait velocity: decreased         Stairs             Wheelchair Mobility    Modified Rankin (Stroke Patients Only)       Balance Overall balance assessment: Needs assistance Sitting-balance support: No upper extremity supported, Feet supported Sitting balance-Leahy Scale: Good Sitting balance - Comments: seated EOB   Standing balance support: No upper extremity supported, During functional activity Standing balance-Leahy Scale: Fair Standing balance comment: poor to fair without RW; fair with RW                            Cognition  Exercises General Exercises - Lower Extremity Long Arc Quad: Strengthening, Both, 10 reps Hip Flexion/Marching: Strengthening, Both, 10 reps    General Comments        Pertinent Vitals/Pain Pain Assessment Pain Assessment: No/denies pain    Home Living                          Prior Function            PT Goals (current goals can now be found in the care plan section) Acute Rehab PT Goals Patient  Stated Goal: return home with family to assist PT Goal Formulation: With patient Time For Goal Achievement: 05/31/22 Potential to Achieve Goals: Good Progress towards PT goals: Progressing toward goals    Frequency    Min 2X/week      PT Plan Current plan remains appropriate    Co-evaluation     PT goals addressed during session: Mobility/safety with mobility;Balance        AM-PAC PT "6 Clicks" Mobility   Outcome Measure  Help needed turning from your back to your side while in a flat bed without using bedrails?: None Help needed moving from lying on your back to sitting on the side of a flat bed without using bedrails?: None Help needed moving to and from a bed to a chair (including a wheelchair)?: A Little Help needed standing up from a chair using your arms (e.g., wheelchair or bedside chair)?: A Little Help needed to walk in hospital room?: A Little Help needed climbing 3-5 steps with a railing? : A Little 6 Click Score: 20    End of Session Equipment Utilized During Treatment: Gait belt Activity Tolerance: Patient tolerated treatment well;Patient limited by fatigue Patient left: with call bell/phone within reach;in bed Nurse Communication: Mobility status PT Visit Diagnosis: Unsteadiness on feet (R26.81);Other abnormalities of gait and mobility (R26.89);Muscle weakness (generalized) (M62.81)     Time:  -     Charges:                1:22 PM, 05/30/22 Josue Hector PT DPT  Physical Therapist with [facility]  [phone]

## 2022-05-31 ENCOUNTER — Telehealth: Payer: Self-pay

## 2022-05-31 ENCOUNTER — Telehealth: Payer: Self-pay | Admitting: Family Medicine

## 2022-05-31 NOTE — Telephone Encounter (Signed)
Calling about delay and start of care. Have not been able to reach patient, will call back Monday 2/5. Please call back.

## 2022-05-31 NOTE — Patient Outreach (Signed)
  Care Coordination TOC Note Transition Care Management Follow-up Telephone Call Date of discharge and from where: 05/30/22-[facility]  Dx: "A-fib unspecified, Acute CHF" How have you been since you were released from the hospital? Call completed with son-Tim. He voices that patient "doing good." He rested well last night. He has ate a good dinner last night and breakfast this morning. No pain reported. Patient has taken Lasix this morning. He has chronic LE edema and son monitoring. Any questions or concerns? No  Items Reviewed: Did the pt receive and understand the discharge instructions provided? Yes  Medications obtained and verified? Yes  Other? Yes  Any new allergies since your discharge? No  Dietary orders reviewed? Yes-low salt/heart healthy Do you have support at home? Yes -Son has moved in to care for patient  Home Care and Equipment/Supplies: Were home health services ordered? yes If so, what is the name of the agency? [facility]  Has the agency set up a time to come to the patient's home? No-son advised to follow up if he has not heard from agency in 48 hrs post discharge Were any new equipment or medical supplies ordered?  Yes: RW What is the name of the medical supply agency? [facility] Were you able to get the supplies/equipment? yes Do you have any questions related to the use of the equipment or supplies? No  Functional Questionnaire: (I = Independent and D = Dependent) ADLs: A  Bathing/Dressing- A  Meal Prep- A  Eating- I  Maintaining continence- I  Transferring/Ambulation- A  Managing Meds- A  Follow up appointments reviewed:  PCP Hospital f/u appt confirmed? No  Son shares that he does not want to make PCP appt at this time-he is considering changing to another practice-will make decision soon and call and make appt. Provided son with [facility] phone line and advised patient to contact insurance provider for list of in network providers as well. He  will follow up and make a decision. [facility] f/u appt confirmed? Yes  Scheduled to see Dr. Dellia Cloud on 06/17/22 @ 8:40 am. Patient [facility]-awaiting appt-will follow up if he has not heard from them in a few days Are transportation arrangements needed? No  If their condition worsens, is the pt aware to call PCP or go to the Emergency Dept.? Yes Was the patient provided with contact information for the PCP's office or ED? Yes Was to pt encouraged to call back with questions or concerns? Yes  SDOH assessments and interventions completed:   Yes SDOH Interventions Today    Flowsheet Row Most Recent Value  SDOH Interventions   Food Insecurity Interventions Intervention Not Indicated  Transportation Interventions Intervention Not Indicated       Care Coordination Interventions:  Education provided on cardiac  dx and sx mmgt    Encounter Outcome:  Pt. Visit Completed    Enzo Montgomery, RN,BSN,CCM [facility] Telephonic Care Management Coordinator Direct Phone: [phone] Toll Free: [phone] Fax: [phone]

## 2022-06-01 DIAGNOSIS — Z9181 History of falling: Secondary | ICD-10-CM | POA: Diagnosis not present

## 2022-06-01 DIAGNOSIS — Z791 Long term (current) use of non-steroidal anti-inflammatories (NSAID): Secondary | ICD-10-CM | POA: Diagnosis not present

## 2022-06-01 DIAGNOSIS — S2239XD Fracture of one rib, unspecified side, subsequent encounter for fracture with routine healing: Secondary | ICD-10-CM | POA: Diagnosis not present

## 2022-06-01 DIAGNOSIS — I5023 Acute on chronic systolic (congestive) heart failure: Secondary | ICD-10-CM | POA: Diagnosis not present

## 2022-06-01 DIAGNOSIS — D539 Nutritional anemia, unspecified: Secondary | ICD-10-CM | POA: Diagnosis not present

## 2022-06-01 DIAGNOSIS — N1832 Chronic kidney disease, stage 3b: Secondary | ICD-10-CM | POA: Diagnosis not present

## 2022-06-01 DIAGNOSIS — E782 Mixed hyperlipidemia: Secondary | ICD-10-CM | POA: Diagnosis not present

## 2022-06-01 DIAGNOSIS — W19XXXD Unspecified fall, subsequent encounter: Secondary | ICD-10-CM | POA: Diagnosis not present

## 2022-06-01 DIAGNOSIS — I130 Hypertensive heart and chronic kidney disease with heart failure and stage 1 through stage 4 chronic kidney disease, or unspecified chronic kidney disease: Secondary | ICD-10-CM | POA: Diagnosis not present

## 2022-06-01 DIAGNOSIS — Z87891 Personal history of nicotine dependence: Secondary | ICD-10-CM | POA: Diagnosis not present

## 2022-06-01 DIAGNOSIS — I480 Paroxysmal atrial fibrillation: Secondary | ICD-10-CM | POA: Diagnosis not present

## 2022-06-01 DIAGNOSIS — R262 Difficulty in walking, not elsewhere classified: Secondary | ICD-10-CM | POA: Diagnosis not present

## 2022-06-01 DIAGNOSIS — M6281 Muscle weakness (generalized): Secondary | ICD-10-CM | POA: Diagnosis not present

## 2022-06-01 DIAGNOSIS — I2489 Other forms of acute ischemic heart disease: Secondary | ICD-10-CM | POA: Diagnosis not present

## 2022-06-01 DIAGNOSIS — I351 Nonrheumatic aortic (valve) insufficiency: Secondary | ICD-10-CM | POA: Diagnosis not present

## 2022-06-03 DIAGNOSIS — I5023 Acute on chronic systolic (congestive) heart failure: Secondary | ICD-10-CM | POA: Diagnosis not present

## 2022-06-03 DIAGNOSIS — R262 Difficulty in walking, not elsewhere classified: Secondary | ICD-10-CM | POA: Diagnosis not present

## 2022-06-03 DIAGNOSIS — N1832 Chronic kidney disease, stage 3b: Secondary | ICD-10-CM | POA: Diagnosis not present

## 2022-06-03 DIAGNOSIS — M6281 Muscle weakness (generalized): Secondary | ICD-10-CM | POA: Diagnosis not present

## 2022-06-03 DIAGNOSIS — S2239XD Fracture of one rib, unspecified side, subsequent encounter for fracture with routine healing: Secondary | ICD-10-CM | POA: Diagnosis not present

## 2022-06-03 DIAGNOSIS — Z87891 Personal history of nicotine dependence: Secondary | ICD-10-CM | POA: Diagnosis not present

## 2022-06-03 DIAGNOSIS — I2489 Other forms of acute ischemic heart disease: Secondary | ICD-10-CM | POA: Diagnosis not present

## 2022-06-03 DIAGNOSIS — W19XXXD Unspecified fall, subsequent encounter: Secondary | ICD-10-CM | POA: Diagnosis not present

## 2022-06-03 DIAGNOSIS — E782 Mixed hyperlipidemia: Secondary | ICD-10-CM | POA: Diagnosis not present

## 2022-06-03 DIAGNOSIS — I480 Paroxysmal atrial fibrillation: Secondary | ICD-10-CM | POA: Diagnosis not present

## 2022-06-03 DIAGNOSIS — I351 Nonrheumatic aortic (valve) insufficiency: Secondary | ICD-10-CM | POA: Diagnosis not present

## 2022-06-03 DIAGNOSIS — I130 Hypertensive heart and chronic kidney disease with heart failure and stage 1 through stage 4 chronic kidney disease, or unspecified chronic kidney disease: Secondary | ICD-10-CM | POA: Diagnosis not present

## 2022-06-03 DIAGNOSIS — Z9181 History of falling: Secondary | ICD-10-CM | POA: Diagnosis not present

## 2022-06-03 DIAGNOSIS — Z791 Long term (current) use of non-steroidal anti-inflammatories (NSAID): Secondary | ICD-10-CM | POA: Diagnosis not present

## 2022-06-03 DIAGNOSIS — D539 Nutritional anemia, unspecified: Secondary | ICD-10-CM | POA: Diagnosis not present

## 2022-06-05 DIAGNOSIS — S2239XD Fracture of one rib, unspecified side, subsequent encounter for fracture with routine healing: Secondary | ICD-10-CM | POA: Diagnosis not present

## 2022-06-05 DIAGNOSIS — R262 Difficulty in walking, not elsewhere classified: Secondary | ICD-10-CM | POA: Diagnosis not present

## 2022-06-05 DIAGNOSIS — I2489 Other forms of acute ischemic heart disease: Secondary | ICD-10-CM | POA: Diagnosis not present

## 2022-06-05 DIAGNOSIS — D539 Nutritional anemia, unspecified: Secondary | ICD-10-CM | POA: Diagnosis not present

## 2022-06-05 DIAGNOSIS — Z87891 Personal history of nicotine dependence: Secondary | ICD-10-CM | POA: Diagnosis not present

## 2022-06-05 DIAGNOSIS — I5023 Acute on chronic systolic (congestive) heart failure: Secondary | ICD-10-CM | POA: Diagnosis not present

## 2022-06-05 DIAGNOSIS — W19XXXD Unspecified fall, subsequent encounter: Secondary | ICD-10-CM | POA: Diagnosis not present

## 2022-06-05 DIAGNOSIS — I480 Paroxysmal atrial fibrillation: Secondary | ICD-10-CM | POA: Diagnosis not present

## 2022-06-05 DIAGNOSIS — Z9181 History of falling: Secondary | ICD-10-CM | POA: Diagnosis not present

## 2022-06-05 DIAGNOSIS — E782 Mixed hyperlipidemia: Secondary | ICD-10-CM | POA: Diagnosis not present

## 2022-06-05 DIAGNOSIS — M6281 Muscle weakness (generalized): Secondary | ICD-10-CM | POA: Diagnosis not present

## 2022-06-05 DIAGNOSIS — I351 Nonrheumatic aortic (valve) insufficiency: Secondary | ICD-10-CM | POA: Diagnosis not present

## 2022-06-05 DIAGNOSIS — Z791 Long term (current) use of non-steroidal anti-inflammatories (NSAID): Secondary | ICD-10-CM | POA: Diagnosis not present

## 2022-06-05 DIAGNOSIS — N1832 Chronic kidney disease, stage 3b: Secondary | ICD-10-CM | POA: Diagnosis not present

## 2022-06-05 DIAGNOSIS — I130 Hypertensive heart and chronic kidney disease with heart failure and stage 1 through stage 4 chronic kidney disease, or unspecified chronic kidney disease: Secondary | ICD-10-CM | POA: Diagnosis not present

## 2022-06-07 DIAGNOSIS — R262 Difficulty in walking, not elsewhere classified: Secondary | ICD-10-CM | POA: Diagnosis not present

## 2022-06-07 DIAGNOSIS — I480 Paroxysmal atrial fibrillation: Secondary | ICD-10-CM | POA: Diagnosis not present

## 2022-06-07 DIAGNOSIS — I130 Hypertensive heart and chronic kidney disease with heart failure and stage 1 through stage 4 chronic kidney disease, or unspecified chronic kidney disease: Secondary | ICD-10-CM | POA: Diagnosis not present

## 2022-06-07 DIAGNOSIS — W19XXXD Unspecified fall, subsequent encounter: Secondary | ICD-10-CM | POA: Diagnosis not present

## 2022-06-07 DIAGNOSIS — M6281 Muscle weakness (generalized): Secondary | ICD-10-CM | POA: Diagnosis not present

## 2022-06-07 DIAGNOSIS — E782 Mixed hyperlipidemia: Secondary | ICD-10-CM | POA: Diagnosis not present

## 2022-06-07 DIAGNOSIS — Z9181 History of falling: Secondary | ICD-10-CM | POA: Diagnosis not present

## 2022-06-07 DIAGNOSIS — N1832 Chronic kidney disease, stage 3b: Secondary | ICD-10-CM | POA: Diagnosis not present

## 2022-06-07 DIAGNOSIS — I351 Nonrheumatic aortic (valve) insufficiency: Secondary | ICD-10-CM | POA: Diagnosis not present

## 2022-06-07 DIAGNOSIS — D539 Nutritional anemia, unspecified: Secondary | ICD-10-CM | POA: Diagnosis not present

## 2022-06-07 DIAGNOSIS — I2489 Other forms of acute ischemic heart disease: Secondary | ICD-10-CM | POA: Diagnosis not present

## 2022-06-07 DIAGNOSIS — I5023 Acute on chronic systolic (congestive) heart failure: Secondary | ICD-10-CM | POA: Diagnosis not present

## 2022-06-07 DIAGNOSIS — Z87891 Personal history of nicotine dependence: Secondary | ICD-10-CM | POA: Diagnosis not present

## 2022-06-07 DIAGNOSIS — S2239XD Fracture of one rib, unspecified side, subsequent encounter for fracture with routine healing: Secondary | ICD-10-CM | POA: Diagnosis not present

## 2022-06-07 DIAGNOSIS — Z791 Long term (current) use of non-steroidal anti-inflammatories (NSAID): Secondary | ICD-10-CM | POA: Diagnosis not present

## 2022-06-10 ENCOUNTER — Other Ambulatory Visit: Payer: Self-pay | Admitting: Family Medicine

## 2022-06-11 ENCOUNTER — Ambulatory Visit (INDEPENDENT_AMBULATORY_CARE_PROVIDER_SITE_OTHER): Payer: Medicare Other

## 2022-06-11 DIAGNOSIS — Z87891 Personal history of nicotine dependence: Secondary | ICD-10-CM | POA: Diagnosis not present

## 2022-06-11 DIAGNOSIS — I130 Hypertensive heart and chronic kidney disease with heart failure and stage 1 through stage 4 chronic kidney disease, or unspecified chronic kidney disease: Secondary | ICD-10-CM | POA: Diagnosis not present

## 2022-06-11 DIAGNOSIS — D539 Nutritional anemia, unspecified: Secondary | ICD-10-CM

## 2022-06-11 DIAGNOSIS — W19XXXD Unspecified fall, subsequent encounter: Secondary | ICD-10-CM | POA: Diagnosis not present

## 2022-06-11 DIAGNOSIS — I5023 Acute on chronic systolic (congestive) heart failure: Secondary | ICD-10-CM

## 2022-06-11 DIAGNOSIS — I480 Paroxysmal atrial fibrillation: Secondary | ICD-10-CM | POA: Diagnosis not present

## 2022-06-11 DIAGNOSIS — M6281 Muscle weakness (generalized): Secondary | ICD-10-CM | POA: Diagnosis not present

## 2022-06-11 DIAGNOSIS — E782 Mixed hyperlipidemia: Secondary | ICD-10-CM

## 2022-06-11 DIAGNOSIS — Z791 Long term (current) use of non-steroidal anti-inflammatories (NSAID): Secondary | ICD-10-CM | POA: Diagnosis not present

## 2022-06-11 DIAGNOSIS — Z9181 History of falling: Secondary | ICD-10-CM | POA: Diagnosis not present

## 2022-06-11 DIAGNOSIS — I2489 Other forms of acute ischemic heart disease: Secondary | ICD-10-CM

## 2022-06-11 DIAGNOSIS — I351 Nonrheumatic aortic (valve) insufficiency: Secondary | ICD-10-CM

## 2022-06-11 DIAGNOSIS — N1832 Chronic kidney disease, stage 3b: Secondary | ICD-10-CM

## 2022-06-11 DIAGNOSIS — S2239XD Fracture of one rib, unspecified side, subsequent encounter for fracture with routine healing: Secondary | ICD-10-CM | POA: Diagnosis not present

## 2022-06-11 DIAGNOSIS — R262 Difficulty in walking, not elsewhere classified: Secondary | ICD-10-CM | POA: Diagnosis not present

## 2022-06-13 DIAGNOSIS — I130 Hypertensive heart and chronic kidney disease with heart failure and stage 1 through stage 4 chronic kidney disease, or unspecified chronic kidney disease: Secondary | ICD-10-CM | POA: Diagnosis not present

## 2022-06-13 DIAGNOSIS — S2239XD Fracture of one rib, unspecified side, subsequent encounter for fracture with routine healing: Secondary | ICD-10-CM | POA: Diagnosis not present

## 2022-06-13 DIAGNOSIS — Z9181 History of falling: Secondary | ICD-10-CM | POA: Diagnosis not present

## 2022-06-13 DIAGNOSIS — I2489 Other forms of acute ischemic heart disease: Secondary | ICD-10-CM | POA: Diagnosis not present

## 2022-06-13 DIAGNOSIS — M6281 Muscle weakness (generalized): Secondary | ICD-10-CM | POA: Diagnosis not present

## 2022-06-13 DIAGNOSIS — N1832 Chronic kidney disease, stage 3b: Secondary | ICD-10-CM | POA: Diagnosis not present

## 2022-06-13 DIAGNOSIS — Z87891 Personal history of nicotine dependence: Secondary | ICD-10-CM | POA: Diagnosis not present

## 2022-06-13 DIAGNOSIS — R262 Difficulty in walking, not elsewhere classified: Secondary | ICD-10-CM | POA: Diagnosis not present

## 2022-06-13 DIAGNOSIS — D539 Nutritional anemia, unspecified: Secondary | ICD-10-CM | POA: Diagnosis not present

## 2022-06-13 DIAGNOSIS — I5023 Acute on chronic systolic (congestive) heart failure: Secondary | ICD-10-CM | POA: Diagnosis not present

## 2022-06-13 DIAGNOSIS — E782 Mixed hyperlipidemia: Secondary | ICD-10-CM | POA: Diagnosis not present

## 2022-06-13 DIAGNOSIS — I480 Paroxysmal atrial fibrillation: Secondary | ICD-10-CM | POA: Diagnosis not present

## 2022-06-13 DIAGNOSIS — Z791 Long term (current) use of non-steroidal anti-inflammatories (NSAID): Secondary | ICD-10-CM | POA: Diagnosis not present

## 2022-06-13 DIAGNOSIS — I351 Nonrheumatic aortic (valve) insufficiency: Secondary | ICD-10-CM | POA: Diagnosis not present

## 2022-06-13 DIAGNOSIS — W19XXXD Unspecified fall, subsequent encounter: Secondary | ICD-10-CM | POA: Diagnosis not present

## 2022-06-17 ENCOUNTER — Encounter: Payer: Self-pay | Admitting: Internal Medicine

## 2022-06-17 ENCOUNTER — Ambulatory Visit: Payer: Medicare Other | Attending: Internal Medicine | Admitting: Internal Medicine

## 2022-06-17 VITALS — BP 136/62 | HR 82 | Ht 69.0 in | Wt 173.0 lb

## 2022-06-17 DIAGNOSIS — R262 Difficulty in walking, not elsewhere classified: Secondary | ICD-10-CM | POA: Diagnosis not present

## 2022-06-17 DIAGNOSIS — I509 Heart failure, unspecified: Secondary | ICD-10-CM | POA: Diagnosis not present

## 2022-06-17 DIAGNOSIS — E782 Mixed hyperlipidemia: Secondary | ICD-10-CM | POA: Diagnosis not present

## 2022-06-17 DIAGNOSIS — S2239XD Fracture of one rib, unspecified side, subsequent encounter for fracture with routine healing: Secondary | ICD-10-CM | POA: Diagnosis not present

## 2022-06-17 DIAGNOSIS — Z87891 Personal history of nicotine dependence: Secondary | ICD-10-CM | POA: Diagnosis not present

## 2022-06-17 DIAGNOSIS — M6281 Muscle weakness (generalized): Secondary | ICD-10-CM | POA: Diagnosis not present

## 2022-06-17 DIAGNOSIS — I351 Nonrheumatic aortic (valve) insufficiency: Secondary | ICD-10-CM

## 2022-06-17 DIAGNOSIS — N1832 Chronic kidney disease, stage 3b: Secondary | ICD-10-CM | POA: Diagnosis not present

## 2022-06-17 DIAGNOSIS — I77819 Aortic ectasia, unspecified site: Secondary | ICD-10-CM

## 2022-06-17 DIAGNOSIS — D539 Nutritional anemia, unspecified: Secondary | ICD-10-CM | POA: Diagnosis not present

## 2022-06-17 DIAGNOSIS — Z9181 History of falling: Secondary | ICD-10-CM | POA: Diagnosis not present

## 2022-06-17 DIAGNOSIS — I130 Hypertensive heart and chronic kidney disease with heart failure and stage 1 through stage 4 chronic kidney disease, or unspecified chronic kidney disease: Secondary | ICD-10-CM | POA: Diagnosis not present

## 2022-06-17 DIAGNOSIS — I5023 Acute on chronic systolic (congestive) heart failure: Secondary | ICD-10-CM | POA: Diagnosis not present

## 2022-06-17 DIAGNOSIS — I480 Paroxysmal atrial fibrillation: Secondary | ICD-10-CM | POA: Diagnosis not present

## 2022-06-17 DIAGNOSIS — I2489 Other forms of acute ischemic heart disease: Secondary | ICD-10-CM | POA: Diagnosis not present

## 2022-06-17 DIAGNOSIS — Z791 Long term (current) use of non-steroidal anti-inflammatories (NSAID): Secondary | ICD-10-CM | POA: Diagnosis not present

## 2022-06-17 DIAGNOSIS — W19XXXD Unspecified fall, subsequent encounter: Secondary | ICD-10-CM | POA: Diagnosis not present

## 2022-06-17 MED ORDER — APIXABAN 5 MG PO TABS: 5.0000 mg | ORAL_TABLET | Freq: Two times a day (BID) | ORAL | 11 refills | Status: DC

## 2022-06-17 NOTE — Progress Notes (Signed)
Cardiology Office Note  Date: 06/17/2022   ID: Brett Blankenship, DOB [birth year], MRN [id number]  PCP:  Claretta Fraise, MD  Cardiologist:  None Electrophysiologist:  None   Reason for Office Visit: Abnormal echocardiogram, posthospitalization follow-up   History of Present Illness: Brett Blankenship is a 89 y.o. male with atrial fibrillation, valvular heart disease (possible severe AI and mild AS on 05/28/2022) presented to cardiology clinic for follow-up visit.  Patient was recently admitted to [facility] in 04/2022 with a chief complaint of generalized weakness associated with decreased p.o. intake x 2 months prior to presentation. Denied any SOB but had chronic LE swelling for 2 years. He was admitted for CHF exacerbation on Lasix. Echocardiogram showed normal LVEF, possible severe AI and mild AS. Eliquis held due to fall in 2021. He is here for follow-up visit, accompanied by son. Denies any symptoms of fatigue, SOB, chest pain, LE swelling.  Overall doing great, walks with a cane and did not have any falls in the last 1 year.  Son helps him with his medications.   Past Medical History:  Diagnosis Date   HYPERTENSION 07/12/2009    Past Surgical History:  Procedure Laterality Date   APPENDECTOMY  1960   NECK SURGERY      Current Outpatient Medications  Medication Sig Dispense Refill   acetaminophen (TYLENOL) 650 MG CR tablet Take 650 mg by mouth every 8 (eight) hours as needed for pain.     amLODipine (NORVASC) 5 MG tablet Take 0.5 tablets (2.5 mg total) by mouth daily. 15 tablet 0   apixaban (ELIQUIS) 5 MG TABS tablet Take 1 tablet (5 mg total) by mouth 2 (two) times daily. 60 tablet 11   fenofibrate 160 MG tablet TAKE 1 TABLET BY MOUTH ONCE A DAY. (Patient taking differently: Take 160 mg by mouth daily.) 90 tablet 1   fish oil-omega-3 fatty acids 1000 MG capsule Take 2 g by mouth daily.     furosemide (LASIX) 20 MG tablet Take 1 tablet (20 mg total) by mouth daily.  30 tablet 0   Multiple Vitamins-Minerals (EYE VITAMINS & MINERALS PO) Take by mouth. [employer]     nabumetone (RELAFEN) 500 MG tablet TAKE 2 TABLETS BY MOUTH TWICE DAILY AS NEEDED FOR PAIN. (Patient taking differently: Take 1,000 mg by mouth 2 (two) times daily as needed for mild pain.) 360 tablet 0   nitroGLYCERIN (NITROSTAT) 0.3 MG SL tablet Place 1 tablet (0.3 mg total) under the tongue every 5 (five) minutes as needed for chest pain. 90 tablet 12   Vitamin D, Ergocalciferol, (DRISDOL) 1.25 MG (50000 UNIT) CAPS capsule TAKE 1 CAPSULE BY MOUTH ONCE EVERY 7 DAYS. 13 capsule 1   No current facility-administered medications for this visit.   Allergies:  Patient has no known allergies.   Social History: The patient  reports that he quit smoking about 58 years ago. His smoking use included cigarettes. He has a 1.50 pack-year smoking history. He has never used smokeless tobacco. He reports that he does not currently use alcohol. He reports that he does not currently use drugs.   Family History: The patient's family history includes Cancer in his brother; Heart disease in his father; Hypertension in his father.   ROS:  Please see the history of present illness. Otherwise, complete review of systems is positive for none.  All other systems are reviewed and negative.   Physical Exam: VS:  BP 136/62   Pulse 82   Ht 5' 9"$  (  1.753 m)   Wt 173 lb (78.5 kg)   SpO2 99%   BMI 25.55 kg/m , BMI Body mass index is 25.55 kg/m.  Wt Readings from Last 3 Encounters:  06/17/22 173 lb (78.5 kg)  05/30/22 167 lb 15.9 oz (76.2 kg)  05/23/22 182 lb 6 oz (82.7 kg)    General: Patient appears comfortable at rest. HEENT: Conjunctiva and lids normal, oropharynx clear with moist mucosa. Neck: Supple, no elevated JVP or carotid bruits, no thyromegaly. Lungs: Clear to auscultation, nonlabored breathing at rest. Cardiac: Iregular rate and rhythm, systolic murmur +, diastolic murmur +, faint Abdomen: Soft,  nontender, no hepatomegaly, bowel sounds present, no guarding or rebound. Extremities: No pitting edema, distal pulses 2+. Skin: Warm and dry. Musculoskeletal: No kyphosis. Neuropsychiatric: Alert and oriented x3, affect grossly appropriate.  ECG: Atrial fibrillation, rate controlled  Recent Labwork: 05/27/2022: ALT 19; AST 30; B Natriuretic Peptide 669.0; TSH 1.180 05/28/2022: Hemoglobin 10.3; Platelets 181 05/29/2022: BUN 37; Creatinine, Ser 1.12; Magnesium 1.9; Potassium 3.5; Sodium 135     Component Value Date/Time   CHOL 98 (L) 04/18/2022 1018   TRIG 121 04/18/2022 1018   HDL 26 (L) 04/18/2022 1018   CHOLHDL 3.8 04/18/2022 1018   LDLCALC 50 04/18/2022 1018    Other Studies Reviewed Today: Echocardiogram in 05/28/2022 1. The aortic valve was not well visualized. There is an eccentric aortic  valve regurgitation jet impinging along the ventricular surface of  anterior mitral valve leaflet resulting in thickening/restricted mobility  of anterior mitral valve leaflet with  mild mitral valve regurgitation consistent with chronic, probably severe  aortic valve regurgitation. Mild aortic valve stenosis. Consider TEE for  further evaluation of aortic valve.  2. Aortic dilatation noted. There is mild dilatation of the aortic root,  measuring 43 mm. There is moderate dilatation of the ascending aorta,  measuring 49 mm.  3. Left ventricular ejection fraction, by estimation, is 60 to 65%. The  left ventricle has normal function. Left ventricular endocardial border  not optimally defined to evaluate regional wall motion. There is moderate  asymmetric left ventricular  hypertrophy of the septal and basal segments. Left ventricular diastolic  function could not be evaluated.   4. Right ventricular systolic function was not well visualized. The right  ventricular size is normal.   5. Left atrial size was severely dilated.   6. The mitral valve is abnormal. Mild mitral valve regurgitation.  No  evidence of mitral stenosis. Moderate mitral annular calcification.   Comparison(s): Prior images unable to be directly viewed, comparison made  by report only. Changes from prior study are noted. Normal LVEF and  eccentric aortic valve regurgitation likely severe and chronic in duration  (worsened from moderate aortic  regurgitation from 2018).   Assessment and Plan: Patient is a 89 year old M known to have persistent atrial fibrillation, valvular heart disease (possible severe AI and mild AS) present to cardiology clinic for follow-up visit.  # Severe eccentric aortic valve regurgitation likely secondary to aortic root/ascending aortic dilatation (aortic root dilatation 43 mm and ascending aorta dilatation 49 mm) -Patient is asymptomatic, denied any DOE/chest pain -Patient refused any invasive procedures including TEE. Not a candidate for any surgical intervention and he refused any surgeries as well. -Continue amlodipine 5 mg once daily and p.o. Lasix 20 mg. -Patient also has mild mitral regurgitation and mild aortic valve stenosis which will be monitored with serial echocardiogram.  # Persistent atrial fibrillation (new onset in 04/2022), rate controlled (EKG today showed atrial  fibrillation, rate controlled) -Rate controlling agents held due to possible severe AI -Start Eliquis 5 mg twice daily (no risk of falls in the last 1 year)  I have spent a total of 33 minutes with patient reviewing chart, EKGs, labs and examining patient as well as establishing an assessment and plan that was discussed with the patient.  > 50% of time was spent in direct patient care.     Medication Adjustments/Labs and Tests Ordered: Current medicines are reviewed at length with the patient today.  Concerns regarding medicines are outlined above.   Tests Ordered: Orders Placed This Encounter  Procedures   Basic metabolic panel   EKG [id number]    Medication Changes: Meds ordered this encounter   Medications   apixaban (ELIQUIS) 5 MG TABS tablet    Sig: Take 1 tablet (5 mg total) by mouth 2 (two) times daily.    Dispense:  60 tablet    Refill:  11    Disposition:  Follow up  6 months  Signed, Isidra Mings Fidel Levy, MD, 06/17/2022 3:21 PM    [facility] [address] [place], [place] [address]

## 2022-06-17 NOTE — Patient Instructions (Signed)
Medication Instructions:  Your physician has recommended you make the following change in your medication:  - Start Eliquis 5 mg tablets twice daily   Labwork: In 6 months: -BMET  Testing/Procedures: None  Follow-Up: Follow up with Dr. Dellia Cloud in 6 months.   Any Other Special Instructions Will Be Listed Below (If Applicable).     If you need a refill on your cardiac medications before your next appointment, please call your pharmacy.

## 2022-06-27 DIAGNOSIS — I351 Nonrheumatic aortic (valve) insufficiency: Secondary | ICD-10-CM | POA: Diagnosis not present

## 2022-06-27 DIAGNOSIS — Z9181 History of falling: Secondary | ICD-10-CM | POA: Diagnosis not present

## 2022-06-27 DIAGNOSIS — I5023 Acute on chronic systolic (congestive) heart failure: Secondary | ICD-10-CM | POA: Diagnosis not present

## 2022-06-27 DIAGNOSIS — W19XXXD Unspecified fall, subsequent encounter: Secondary | ICD-10-CM | POA: Diagnosis not present

## 2022-06-27 DIAGNOSIS — I130 Hypertensive heart and chronic kidney disease with heart failure and stage 1 through stage 4 chronic kidney disease, or unspecified chronic kidney disease: Secondary | ICD-10-CM | POA: Diagnosis not present

## 2022-06-27 DIAGNOSIS — I480 Paroxysmal atrial fibrillation: Secondary | ICD-10-CM | POA: Diagnosis not present

## 2022-06-27 DIAGNOSIS — M6281 Muscle weakness (generalized): Secondary | ICD-10-CM | POA: Diagnosis not present

## 2022-06-27 DIAGNOSIS — N1832 Chronic kidney disease, stage 3b: Secondary | ICD-10-CM | POA: Diagnosis not present

## 2022-06-27 DIAGNOSIS — Z791 Long term (current) use of non-steroidal anti-inflammatories (NSAID): Secondary | ICD-10-CM | POA: Diagnosis not present

## 2022-06-27 DIAGNOSIS — I2489 Other forms of acute ischemic heart disease: Secondary | ICD-10-CM | POA: Diagnosis not present

## 2022-06-27 DIAGNOSIS — Z87891 Personal history of nicotine dependence: Secondary | ICD-10-CM | POA: Diagnosis not present

## 2022-06-27 DIAGNOSIS — S2239XD Fracture of one rib, unspecified side, subsequent encounter for fracture with routine healing: Secondary | ICD-10-CM | POA: Diagnosis not present

## 2022-06-27 DIAGNOSIS — D539 Nutritional anemia, unspecified: Secondary | ICD-10-CM | POA: Diagnosis not present

## 2022-06-27 DIAGNOSIS — R262 Difficulty in walking, not elsewhere classified: Secondary | ICD-10-CM | POA: Diagnosis not present

## 2022-06-27 DIAGNOSIS — E782 Mixed hyperlipidemia: Secondary | ICD-10-CM | POA: Diagnosis not present

## 2022-07-22 ENCOUNTER — Other Ambulatory Visit: Payer: Self-pay | Admitting: Family Medicine

## 2022-08-02 ENCOUNTER — Other Ambulatory Visit: Payer: Self-pay | Admitting: Family Medicine

## 2022-08-02 DIAGNOSIS — G8929 Other chronic pain: Secondary | ICD-10-CM

## 2022-08-24 ENCOUNTER — Other Ambulatory Visit: Payer: Self-pay | Admitting: Family Medicine

## 2022-08-24 DIAGNOSIS — I10 Essential (primary) hypertension: Secondary | ICD-10-CM

## 2022-08-28 ENCOUNTER — Ambulatory Visit: Payer: Medicare Other | Admitting: Nurse Practitioner

## 2022-08-29 ENCOUNTER — Encounter: Payer: Self-pay | Admitting: Family Medicine

## 2022-09-01 ENCOUNTER — Emergency Department (HOSPITAL_COMMUNITY): Payer: Medicare Other

## 2022-09-01 ENCOUNTER — Other Ambulatory Visit: Payer: Self-pay

## 2022-09-01 ENCOUNTER — Emergency Department (HOSPITAL_COMMUNITY)
Admission: EM | Admit: 2022-09-01 | Discharge: 2022-09-01 | Disposition: A | Discharge status: Home / Self Care | Payer: Medicare Other | Attending: Emergency Medicine | Admitting: Emergency Medicine

## 2022-09-01 DIAGNOSIS — E876 Hypokalemia: Secondary | ICD-10-CM

## 2022-09-01 DIAGNOSIS — S065XAA Traumatic subdural hemorrhage with loss of consciousness status unknown, initial encounter: Secondary | ICD-10-CM | POA: Diagnosis not present

## 2022-09-01 DIAGNOSIS — D696 Thrombocytopenia, unspecified: Secondary | ICD-10-CM | POA: Insufficient documentation

## 2022-09-01 DIAGNOSIS — S12600A Unspecified displaced fracture of seventh cervical vertebra, initial encounter for closed fracture: Secondary | ICD-10-CM | POA: Diagnosis not present

## 2022-09-01 DIAGNOSIS — I5042 Chronic combined systolic (congestive) and diastolic (congestive) heart failure: Secondary | ICD-10-CM

## 2022-09-01 DIAGNOSIS — S12690A Other displaced fracture of seventh cervical vertebra, initial encounter for closed fracture: Secondary | ICD-10-CM | POA: Diagnosis not present

## 2022-09-01 DIAGNOSIS — I4891 Unspecified atrial fibrillation: Secondary | ICD-10-CM | POA: Diagnosis not present

## 2022-09-01 DIAGNOSIS — Z7901 Long term (current) use of anticoagulants: Secondary | ICD-10-CM | POA: Diagnosis not present

## 2022-09-01 DIAGNOSIS — W19XXXA Unspecified fall, initial encounter: Secondary | ICD-10-CM

## 2022-09-01 DIAGNOSIS — I4819 Other persistent atrial fibrillation: Secondary | ICD-10-CM | POA: Insufficient documentation

## 2022-09-01 DIAGNOSIS — W1830XA Fall on same level, unspecified, initial encounter: Secondary | ICD-10-CM | POA: Diagnosis not present

## 2022-09-01 DIAGNOSIS — M542 Cervicalgia: Secondary | ICD-10-CM | POA: Diagnosis not present

## 2022-09-01 DIAGNOSIS — R600 Localized edema: Secondary | ICD-10-CM

## 2022-09-01 DIAGNOSIS — M546 Pain in thoracic spine: Secondary | ICD-10-CM | POA: Diagnosis not present

## 2022-09-01 DIAGNOSIS — I38 Endocarditis, valve unspecified: Secondary | ICD-10-CM

## 2022-09-01 DIAGNOSIS — S065X0A Traumatic subdural hemorrhage without loss of consciousness, initial encounter: Secondary | ICD-10-CM | POA: Diagnosis not present

## 2022-09-01 DIAGNOSIS — J9811 Atelectasis: Secondary | ICD-10-CM | POA: Diagnosis not present

## 2022-09-01 DIAGNOSIS — R519 Headache, unspecified: Secondary | ICD-10-CM | POA: Diagnosis not present

## 2022-09-01 DIAGNOSIS — J90 Pleural effusion, not elsewhere classified: Secondary | ICD-10-CM | POA: Diagnosis not present

## 2022-09-01 LAB — BASIC METABOLIC PANEL
Anion gap: 7 (ref 5–15)
BUN: 20 mg/dL (ref 8–23)
CO2: 26 mmol/L (ref 22–32)
Calcium: 9 mg/dL (ref 8.9–10.3)
Chloride: 101 mmol/L (ref 98–111)
Creatinine, Ser: 0.94 mg/dL (ref 0.61–1.24)
GFR, Estimated: >60 mL/min (ref >60)
Glucose, Bld: 81 mg/dL (ref 70–99)
Potassium: 3.3 mmol/L — ABNORMAL LOW (ref 3.5–5.1)
Sodium: 134 mmol/L — ABNORMAL LOW (ref 135–145)

## 2022-09-01 LAB — CBC
HCT: 31.2 % — ABNORMAL LOW (ref 39.0–52.0)
Hemoglobin: 10.9 g/dL — ABNORMAL LOW (ref 13.0–17.0)
MCH: 36.6 pg — ABNORMAL HIGH (ref 26.0–34.0)
MCHC: 34.9 g/dL (ref 30.0–36.0)
MCV: 104.7 fL — ABNORMAL HIGH (ref 80.0–100.0)
Platelets: 137 10*3/uL — ABNORMAL LOW (ref 150–400)
RBC: 2.98 MIL/uL — ABNORMAL LOW (ref 4.22–5.81)
RDW: 14.1 % (ref 11.5–15.5)
WBC: 4.9 10*3/uL (ref 4.0–10.5)
nRBC: 0 % (ref 0.0–0.2)

## 2022-09-01 LAB — BRAIN NATRIURETIC PEPTIDE: B Natriuretic Peptide: 1060 pg/mL — ABNORMAL HIGH (ref 0.0–100.0)

## 2022-09-01 MED ORDER — POTASSIUM CHLORIDE CRYS ER 20 MEQ PO TBCR: 20.0000 meq | EXTENDED_RELEASE_TABLET | Freq: Every day | ORAL | 0 refills | Status: DC

## 2022-09-01 MED ORDER — POTASSIUM CHLORIDE CRYS ER 20 MEQ PO TBCR
40.0000 meq | EXTENDED_RELEASE_TABLET | Freq: Once | ORAL | Status: AC
  Administered 2022-09-01: 40 meq via ORAL
  Filled 2022-09-01: qty 2

## 2022-09-01 MED ORDER — ACETAMINOPHEN 500 MG PO TABS
1000.0000 mg | ORAL_TABLET | Freq: Once | ORAL | Status: AC
  Administered 2022-09-01: 1000 mg via ORAL
  Filled 2022-09-01: qty 2

## 2022-09-01 MED ORDER — FUROSEMIDE 10 MG/ML IJ SOLN
40.0000 mg | Freq: Once | INTRAMUSCULAR | Status: AC
  Administered 2022-09-01: 40 mg via INTRAVENOUS
  Filled 2022-09-01: qty 4

## 2022-09-01 NOTE — Discharge Instructions (Addendum)
It was our pleasure to provide your ER care today - we hope that you feel better.  Stop taking your eliquis medication.  Wear the cervical collar at all times. Take acetaminophen as need.   Fall precautions. Use great care/caution, and walker, to help minimize risk of falling.   Take one extra of your lasix medication each day for the next two days. Follow heart healthy eating plan.  Elevate legs as much as possible when not up and about. Follow up closely with your cardiologist in one week.   From today's labs, your potassium level is mildly low - eat plenty of fruits and vegetables, take potassium supplement as prescribed, and follow up with primary care doctor.   Return to ER if worse, new symptoms, fevers, chest pain, increased trouble breathing, numbness/weakness, or other concern.

## 2022-09-01 NOTE — ED Provider Notes (Signed)
[facility] AT [facility] Provider Note   CSN: [id number] Arrival date & time: 09/01/22  4098     History  Chief Complaint  Patient presents with   Brett Blankenship is a 89 y.o. male.  Patient c/o fall one week ago, hitting head, with neck and upper back pain since. No faintness or dizziness prior to fall, indicates leg buckled prior to fall. Pain dull, moderate, non radiating. No radicular pain, no arm or leg pain. No numbness/weakness. Denies loc. Is on blood thinner, eliquis, hx afib. Denies abnormal bruising or bleeding. Denies chest pain or sob. No abd pain or nv. Skin intact. Pt also notes bil leg swelling. Hx same. Hx chf. States compliant w home meds. Denies orthopnea/pnd.   The history is provided by the patient, medical records and a relative.  Fall Pertinent negatives include no chest pain, no abdominal pain and no shortness of breath.       Home Medications Prior to Admission medications   Medication Sig Start Date End Date Taking? Authorizing Provider  acetaminophen (TYLENOL) 650 MG CR tablet Take 650 mg by mouth every 8 (eight) hours as needed for pain.    [provider]  amLODipine (NORVASC) 5 MG tablet TAKE 1/2 TABLET BY MOUTH DAILY 08/26/22   Dettinger, Elige Radon, MD  apixaban (ELIQUIS) 5 MG TABS tablet Take 1 tablet (5 mg total) by mouth 2 (two) times daily. 06/17/22   Mallipeddi, Vishnu P, MD  fenofibrate 160 MG tablet TAKE 1 TABLET BY MOUTH ONCE A DAY. Patient taking differently: Take 160 mg by mouth daily. 11/05/21   Mechele Claude, MD  fish oil-omega-3 fatty acids 1000 MG capsule Take 2 g by mouth daily.    [provider]  furosemide (LASIX) 20 MG tablet TAKE 1 TABLET BY MOUTH DAILY 07/22/22   Mechele Claude, MD  Multiple Vitamins-Minerals (EYE VITAMINS & MINERALS PO) Take by mouth. [facility]    [provider]  nabumetone (RELAFEN) 500 MG tablet TAKE 2 TABLETS BY MOUTH TWICE DAILY AS NEEDED FOR  PAIN. 08/02/22   Mechele Claude, MD  nitroGLYCERIN (NITROSTAT) 0.3 MG SL tablet Place 1 tablet (0.3 mg total) under the tongue every 5 (five) minutes as needed for chest pain. 03/07/21   Mechele Claude, MD  Vitamin D, Ergocalciferol, (DRISDOL) 1.25 MG (50000 UNIT) CAPS capsule TAKE 1 CAPSULE BY MOUTH ONCE EVERY 7 DAYS. 06/11/22   Mechele Claude, MD      Allergies    Patient has no known allergies.    Review of Systems   Review of Systems  Constitutional:  Negative for chills and fever.  HENT:  Negative for nosebleeds and sore throat.   Eyes:  Negative for pain, redness and visual disturbance.  Respiratory:  Negative for cough and shortness of breath.   Cardiovascular:  Positive for leg swelling. Negative for chest pain.  Gastrointestinal:  Negative for abdominal pain, nausea and vomiting.  Genitourinary:  Negative for dysuria and flank pain.  Musculoskeletal:  Positive for back pain and neck pain.  Skin:  Negative for wound.  Neurological:  Negative for weakness and numbness.  Psychiatric/Behavioral:  Negative for confusion.     Physical Exam Updated Vital Signs BP (!) 147/60   Pulse 67   Temp 97.8 F (36.6 C) (Oral)   Resp 18   SpO2 97%  Physical Exam Vitals and nursing note reviewed.  Constitutional:      Appearance: Normal appearance. He is well-developed.  HENT:     Head:     Comments: Tenderness posterior scalp.     Nose: Nose normal.     Mouth/Throat:     Mouth: Mucous membranes are moist.     Pharynx: Oropharynx is clear.  Eyes:     General: No scleral icterus.    Conjunctiva/sclera: Conjunctivae normal.     Pupils: Pupils are equal, round, and reactive to light.  Neck:     Vascular: No carotid bruit.     Trachea: No tracheal deviation.  Cardiovascular:     Rate and Rhythm: Normal rate and regular rhythm.     Pulses: Normal pulses.     Heart sounds: Murmur heard.     No friction rub. No gallop.  Pulmonary:     Effort: Pulmonary effort is normal. No accessory  muscle usage or respiratory distress.     Breath sounds: Normal breath sounds.  Chest:     Chest wall: No tenderness.  Abdominal:     General: Bowel sounds are normal. There is no distension.     Palpations: Abdomen is soft.     Tenderness: There is no abdominal tenderness.     Comments: No abd bruising or contusion  Genitourinary:    Comments: No cva tenderness. Musculoskeletal:     Cervical back: Normal range of motion and neck supple. No rigidity.     Right lower leg: Edema present.     Left lower leg: Edema present.     Comments: Lower cervical and upper thoracic spine tenderness, otherwise CTLS spine, non tender, aligned, no step off. Good rom bil extremities without pain or focal bony tenderness.  Symmetric bilateral ankle and lower leg edema.   Skin:    General: Skin is warm and dry.     Findings: No rash.  Neurological:     Mental Status: He is alert.     Comments: Alert, speech clear. GCS 15. Motor/sens grossly intact bil. Stre 5/5. Sens grossly intact.   Psychiatric:        Mood and Affect: Mood normal.     ED Results / Procedures / Treatments   Labs (all labs ordered are listed, but only abnormal results are displayed) Results for orders placed or performed during the hospital encounter of 09/01/22  CBC  Result Value Ref Range   WBC 4.9 4.0 - 10.5 K/uL   RBC 2.98 (L) 4.22 - 5.81 MIL/uL   Hemoglobin 10.9 (L) 13.0 - 17.0 g/dL   HCT 82.9 (L) 56.2 - 13.0 %   MCV 104.7 (H) 80.0 - 100.0 fL   MCH 36.6 (H) 26.0 - 34.0 pg   MCHC 34.9 30.0 - 36.0 g/dL   RDW 86.5 78.4 - 69.6 %   Platelets 137 (L) 150 - 400 K/uL   nRBC 0.0 0.0 - 0.2 %  Basic metabolic panel  Result Value Ref Range   Sodium 134 (L) 135 - 145 mmol/L   Potassium 3.3 (L) 3.5 - 5.1 mmol/L   Chloride 101 98 - 111 mmol/L   CO2 26 22 - 32 mmol/L   Glucose, Bld 81 70 - 99 mg/dL   BUN 20 8 - 23 mg/dL   Creatinine, Ser 2.95 0.61 - 1.24 mg/dL   Calcium 9.0 8.9 - 28.4 mg/dL   GFR, Estimated >13 >24 mL/min    Anion gap 7 5 - 15  Brain natriuretic peptide  Result Value Ref Range   B Natriuretic Peptide 1,060.0 (H) 0.0 - 100.0 pg/mL  EKG None  Radiology CT Cervical Spine Wo Contrast  Result Date: 09/01/2022 CLINICAL DATA:  89 year old male with persistent pain after a fall on cement, struck head. EXAM: CT CERVICAL SPINE WITHOUT CONTRAST TECHNIQUE: Multidetector CT imaging of the cervical spine was performed without intravenous contrast. Multiplanar CT image reconstructions were also generated. RADIATION DOSE REDUCTION: This exam was performed according to the departmental dose-optimization program which includes automated exposure control, adjustment of the mA and/or kV according to patient size and/or use of iterative reconstruction technique. COMPARISON:  No prior CT available, but report from cervical spine CT 06/02/2002 which describes an acute odontoid fracture at that time. FINDINGS: Alignment: Mild straightening of cervical lordosis. Cervicothoracic junction alignment is within normal limits. Maintained bilateral posterior element alignment. Skull base and vertebrae: Some osteopenia. Visualized skull base is intact. No atlanto-occipital dissociation. 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 Hardware loosening at the tip of the cortical screw (sagittal image 27). Odontoid fragment likely ankylosed to the anterior C1 ring. C1 and C2 alignment is maintained. C3 and C4 appear intact. There is C5-C6 interbody ankylosis from flowing endplate osteophytes. Those levels appear intact. However, ankylosis continues through the C6-C7 disc space and there is an acute oblique fracture through the C7 superior endplate, and extending through the lower right C7 body. The mildly comminuted fracture extends through the C7 body. However, the C7 pedicles and posterior elements appear intact and aligned. Superimposed C7-T1 interbody ankylosis. No additional cervical spine fracture.  Soft tissues and spinal canal: No visible canal hematoma. There is mild prevertebral edema at the C7 level. Otherwise negative for age noncontrast neck soft tissues (calcified carotid atherosclerosis. Disc levels: No significant cervical spinal stenosis, with multiple levels of spinal ankylosis as above. Upper chest: Thoracic spine is detailed separately IMPRESSION: 1. Positive for Acute Fracture In The Ankylosed Spine: mildly comminuted and oblique fracture through the C7 vertebral body, with underlying C6-C7 and C7-T1 ankylosis. The C7 posterior elements appear to remain intact and aligned. This was discussed by telephone with Dr. Cathren Laine on 09/01/2022 at 10:52 . 2. Previous odontoid fracture and ORIF, but nonunion and some hardware loosening at the tip of the screw. Odontoid fragment probably ankylosed to the anterior C1 ring. 3. No other acute traumatic injury identified in the cervical spine. Thoracic spine CT reported separately. Electronically Signed   By: Odessa Fleming M.D.   On: 09/01/2022 11:08   CT Head Wo Contrast  Result Date: 09/01/2022 CLINICAL DATA:  89 year old male with persistent pain after a fall on cement, struck head. EXAM: CT HEAD WITHOUT CONTRAST TECHNIQUE: Contiguous axial images were obtained from the base of the skull through the vertex without intravenous contrast. RADIATION DOSE REDUCTION: This exam was performed according to the departmental dose-optimization program which includes automated exposure control, adjustment of the mA and/or kV according to patient size and/or use of iterative reconstruction technique. COMPARISON:  Head CT 12/21/2009. FINDINGS: Brain: Cerebral volume is not significantly changed since 2021. But there is a new small mixed density right side subdural hematoma most conspicuous on coronal image 38, up to 5 mm in thickness. Contralateral left subdural space seems to remain normal. No other acute intracranial hemorrhage identified. And no significant intracranial  mass effect. No midline shift, asymmetry of the lateral ventricles is mild and appears to be chronic. No ventriculomegaly. Stable gray-white matter differentiation throughout the brain. No cortically based acute infarct identified. Vascular: Calcified atherosclerosis at the skull base. No suspicious intracranial vascular  hyperdensity. Skull: Stable.  No acute fracture identified. Sinuses/Orbits: Visualized paranasal sinuses and mastoids are stable and well aerated. Other: No convincing scalp soft tissue injury. Noncontrast orbits appears stable, negative. IMPRESSION: 1. Positive for a small mixed density right side Subdural Hematoma, 5 mm in thickness. 2. No significant intracranial mass effect. No skull fracture identified. No other acute intracranial abnormality. Electronically Signed   By: Odessa Fleming M.D.   On: 09/01/2022 10:46   DG Chest 2 View  Result Date: 09/01/2022 CLINICAL DATA:  Fall last weekend.  Hurting all over. EXAM: CHEST - 2 VIEW COMPARISON:  05/27/2022 FINDINGS: Cardiac silhouette mildly enlarged. No mediastinal or hilar masses. No evidence of adenopathy. Small pleural effusions. Dependent lung base opacity consistent with atelectasis. Remainder of the lungs is clear. No pneumothorax. Skeletal structures are demineralized, grossly intact. IMPRESSION: 1. Mild cardiomegaly, small effusions with mild dependent lung base atelectasis. No evidence of pneumonia or pulmonary edema. Electronically Signed   By: Amie Portland M.D.   On: 09/01/2022 10:33    Procedures Procedures    Medications Ordered in ED Medications  furosemide (LASIX) injection 40 mg (has no administration in time range)  acetaminophen (TYLENOL) tablet 1,000 mg (1,000 mg Oral Given 09/01/22 1118)    ED Course/ Medical Decision Making/ A&P                             Medical Decision Making Problems Addressed: Hypokalemia: acute illness or injury Persistent atrial fibrillation [facility]): chronic illness or injury with  exacerbation, progression, or side effects of treatment that poses a threat to life or bodily functions Thrombocytopenia (HCC): chronic illness or injury  Amount and/or Complexity of Data Reviewed Independent Historian:     Details: Family, hx External Data Reviewed: notes. Labs: ordered. Radiology: ordered. ECG/medicine tests: ordered.  Risk OTC drugs. Prescription drug management. Decision regarding hospitalization.   Iv ns. Continuous pulse ox and cardiac monitoring. Labs ordered/sent. Imaging ordered.   Differential diagnosis includes head injury, spine fracture, chf, etc. Dispo decision including potential need for admission considered - will get labs and imaging and reassess.   Reviewed nursing notes and prior charts for additional history. External reports reviewed. Additional history from: family.   Cardiac monitor: afib, rate 80. Cervical collar.   Labs reviewed/interpreted by me - wbc normal. Bn elevated. Lasix iv.   Xrays reviewed/interpreted by me - no pna. Effusions.   CT reviewed/interpreted by me - small sdh. C7 fx. Neurosurgery consulted. Neurosurgery reviewed imaging and indicates place in cervical collar, hold eliquis, and f/u in office in two weeks.   Acetaminophen po.   Pt is breathing comfortably, o2 sats good, no chest pain or sob - from valvular heart disease/leg edema standpoint, feel stable for outpatient cardiology f/u.  Pt currently appears stable for d/c. Rec ns, pcp, cardiology f/u.   Return precautions provided.   CRITICAL CARE RE: subdural hematoma on blood thinner therapy, C7 fracture, valvular heart disease/chf.  Performed by: Suzi Roots Total critical care time: 40 minutes Critical care time was exclusive of separately billable procedures and treating other patients. Critical care was necessary to treat or prevent imminent or life-threatening deterioration. Critical care was time spent personally by me on the following activities:  development of treatment plan with patient and/or surrogate as well as nursing, discussions with consultants, evaluation of patient's response to treatment, examination of patient, obtaining history from patient or surrogate, ordering and performing treatments and interventions,  ordering and review of laboratory studies, ordering and review of radiographic studies, pulse oximetry and re-evaluation of patient's condition.            Final Clinical Impression(s) / ED Diagnoses Final diagnoses:  None    Rx / DC Orders ED Discharge Orders     None         Cathren Laine, MD 09/01/22 1250

## 2022-09-01 NOTE — ED Triage Notes (Signed)
Pt reports he had a fall last Saturday and he is now hurting all over. Can't pinpoint a specific spot but that it is getting worse. Pt states he hit hard cement on his buttocks first then his back then his head, denies any LOC from the fall.

## 2022-09-01 NOTE — ED Notes (Signed)
C-collar applied, c-spine held while applied

## 2022-09-01 NOTE — ED Notes (Signed)
Applied Male Ex because patient is unable to ambulate, has closed fracture to the cervical vertebrae. Reasoning is sufficient.

## 2022-09-05 ENCOUNTER — Emergency Department (HOSPITAL_COMMUNITY): Payer: Medicare Other

## 2022-09-05 ENCOUNTER — Other Ambulatory Visit: Payer: Self-pay

## 2022-09-05 ENCOUNTER — Observation Stay (HOSPITAL_COMMUNITY)
Admission: EM | Admit: 2022-09-05 | Discharge: 2022-09-07 | Disposition: A | Discharge status: Home Health | Payer: Medicare Other | Attending: Family Medicine | Admitting: Family Medicine

## 2022-09-05 ENCOUNTER — Encounter (HOSPITAL_COMMUNITY): Payer: Self-pay | Admitting: Emergency Medicine

## 2022-09-05 DIAGNOSIS — I509 Heart failure, unspecified: Secondary | ICD-10-CM | POA: Diagnosis not present

## 2022-09-05 DIAGNOSIS — I4819 Other persistent atrial fibrillation: Secondary | ICD-10-CM | POA: Diagnosis present

## 2022-09-05 DIAGNOSIS — N390 Urinary tract infection, site not specified: Secondary | ICD-10-CM | POA: Insufficient documentation

## 2022-09-05 DIAGNOSIS — D539 Nutritional anemia, unspecified: Secondary | ICD-10-CM | POA: Insufficient documentation

## 2022-09-05 DIAGNOSIS — M6281 Muscle weakness (generalized): Secondary | ICD-10-CM | POA: Insufficient documentation

## 2022-09-05 DIAGNOSIS — I700 Atherosclerosis of aorta: Secondary | ICD-10-CM | POA: Diagnosis not present

## 2022-09-05 DIAGNOSIS — G9340 Encephalopathy, unspecified: Secondary | ICD-10-CM | POA: Diagnosis not present

## 2022-09-05 DIAGNOSIS — D696 Thrombocytopenia, unspecified: Secondary | ICD-10-CM | POA: Diagnosis not present

## 2022-09-05 DIAGNOSIS — Z7901 Long term (current) use of anticoagulants: Secondary | ICD-10-CM | POA: Insufficient documentation

## 2022-09-05 DIAGNOSIS — I110 Hypertensive heart disease with heart failure: Secondary | ICD-10-CM | POA: Insufficient documentation

## 2022-09-05 DIAGNOSIS — M545 Low back pain, unspecified: Secondary | ICD-10-CM | POA: Diagnosis present

## 2022-09-05 DIAGNOSIS — I4891 Unspecified atrial fibrillation: Secondary | ICD-10-CM | POA: Diagnosis present

## 2022-09-05 DIAGNOSIS — Z79899 Other long term (current) drug therapy: Secondary | ICD-10-CM | POA: Diagnosis not present

## 2022-09-05 DIAGNOSIS — R2689 Other abnormalities of gait and mobility: Secondary | ICD-10-CM | POA: Diagnosis not present

## 2022-09-05 DIAGNOSIS — G9341 Metabolic encephalopathy: Secondary | ICD-10-CM | POA: Diagnosis not present

## 2022-09-05 DIAGNOSIS — I480 Paroxysmal atrial fibrillation: Secondary | ICD-10-CM | POA: Diagnosis not present

## 2022-09-05 DIAGNOSIS — Z87891 Personal history of nicotine dependence: Secondary | ICD-10-CM | POA: Diagnosis not present

## 2022-09-05 DIAGNOSIS — I6200 Nontraumatic subdural hemorrhage, unspecified: Secondary | ICD-10-CM | POA: Diagnosis not present

## 2022-09-05 DIAGNOSIS — I10 Essential (primary) hypertension: Secondary | ICD-10-CM | POA: Insufficient documentation

## 2022-09-05 DIAGNOSIS — R531 Weakness: Secondary | ICD-10-CM | POA: Diagnosis not present

## 2022-09-05 DIAGNOSIS — E782 Mixed hyperlipidemia: Secondary | ICD-10-CM | POA: Diagnosis present

## 2022-09-05 DIAGNOSIS — R2681 Unsteadiness on feet: Secondary | ICD-10-CM | POA: Insufficient documentation

## 2022-09-05 DIAGNOSIS — R109 Unspecified abdominal pain: Secondary | ICD-10-CM | POA: Diagnosis not present

## 2022-09-05 DIAGNOSIS — E876 Hypokalemia: Secondary | ICD-10-CM | POA: Insufficient documentation

## 2022-09-05 DIAGNOSIS — R600 Localized edema: Secondary | ICD-10-CM | POA: Insufficient documentation

## 2022-09-05 DIAGNOSIS — K8689 Other specified diseases of pancreas: Secondary | ICD-10-CM | POA: Diagnosis not present

## 2022-09-05 LAB — CBC WITH DIFFERENTIAL/PLATELET
Abs Immature Granulocytes: 0.01 10*3/uL (ref 0.00–0.07)
Basophils Absolute: 0 10*3/uL (ref 0.0–0.1)
Basophils Relative: 1 %
Eosinophils Absolute: 0.1 10*3/uL (ref 0.0–0.5)
Eosinophils Relative: 3 %
HCT: 31.6 % — ABNORMAL LOW (ref 39.0–52.0)
Hemoglobin: 11.1 g/dL — ABNORMAL LOW (ref 13.0–17.0)
Immature Granulocytes: 0 %
Lymphocytes Relative: 29 %
Lymphs Abs: 1.3 10*3/uL (ref 0.7–4.0)
MCH: 37.1 pg — ABNORMAL HIGH (ref 26.0–34.0)
MCHC: 35.1 g/dL (ref 30.0–36.0)
MCV: 105.7 fL — ABNORMAL HIGH (ref 80.0–100.0)
Monocytes Absolute: 0.4 10*3/uL (ref 0.1–1.0)
Monocytes Relative: 8 %
Neutro Abs: 2.7 10*3/uL (ref 1.7–7.7)
Neutrophils Relative %: 59 %
Platelets: 141 10*3/uL — ABNORMAL LOW (ref 150–400)
RBC: 2.99 MIL/uL — ABNORMAL LOW (ref 4.22–5.81)
RDW: 14.2 % (ref 11.5–15.5)
WBC: 4.5 10*3/uL (ref 4.0–10.5)
nRBC: 0 % (ref 0.0–0.2)

## 2022-09-05 LAB — COMPREHENSIVE METABOLIC PANEL
ALT: 19 U/L (ref 0–44)
AST: 32 U/L (ref 15–41)
Albumin: 2.4 g/dL — ABNORMAL LOW (ref 3.5–5.0)
Alkaline Phosphatase: 71 U/L (ref 38–126)
Anion gap: 8 (ref 5–15)
BUN: 20 mg/dL (ref 8–23)
CO2: 26 mmol/L (ref 22–32)
Calcium: 9.1 mg/dL (ref 8.9–10.3)
Chloride: 103 mmol/L (ref 98–111)
Creatinine, Ser: 0.96 mg/dL (ref 0.61–1.24)
GFR, Estimated: >60 mL/min (ref >60)
Glucose, Bld: 97 mg/dL (ref 70–99)
Potassium: 3.4 mmol/L — ABNORMAL LOW (ref 3.5–5.1)
Sodium: 137 mmol/L (ref 135–145)
Total Bilirubin: 0.5 mg/dL (ref 0.3–1.2)
Total Protein: 6.1 g/dL — ABNORMAL LOW (ref 6.5–8.1)

## 2022-09-05 LAB — URINALYSIS, W/ REFLEX TO CULTURE (INFECTION SUSPECTED)
Bilirubin Urine: NEGATIVE
Glucose, UA: NEGATIVE mg/dL
Hgb urine dipstick: NEGATIVE
Ketones, ur: NEGATIVE mg/dL
Nitrite: NEGATIVE
Protein, ur: 30 mg/dL — AB
Specific Gravity, Urine: 1.015 (ref 1.005–1.030)
WBC, UA: >50 WBC/hpf (ref 0–5)
pH: 5 (ref 5.0–8.0)

## 2022-09-05 LAB — BRAIN NATRIURETIC PEPTIDE: B Natriuretic Peptide: 682 pg/mL — ABNORMAL HIGH (ref 0.0–100.0)

## 2022-09-05 MED ORDER — SODIUM CHLORIDE 0.9 % IV SOLN
1.0000 g | Freq: Once | INTRAVENOUS | Status: AC
  Administered 2022-09-05: 1 g via INTRAVENOUS
  Filled 2022-09-05: qty 10

## 2022-09-05 MED ORDER — IOHEXOL 300 MG/ML  SOLN
100.0000 mL | Freq: Once | INTRAMUSCULAR | Status: AC | PRN
  Administered 2022-09-05: 100 mL via INTRAVENOUS

## 2022-09-05 NOTE — ED Provider Notes (Signed)
[place] EMERGENCY DEPARTMENT AT [facility] Provider Note   CSN: [id number] Arrival date & time: 09/05/22  1637     History  Chief Complaint  Patient presents with   Back Pain    Brett Blankenship is a 89 y.o. male.   Back Pain Patient presents with back pain that comes and goes.  Mid to lower back.  Also neck pain.  He is on over-the-counter Tylenol for recent cervical spine fracture.  Also had an cranial hemorrhage.  Has been more confused.  Back pain will come and go somewhat.  Also reported more swelling in his legs.  Had had fall this weekend that had the subdural hematoma and C7 fracture.  Discharge home with cervical collar in place.  Reportedly had to increase the patient's Lasix also from 20-40.  States worsening swelling even compared to then.    Past Medical History:  Diagnosis Date   HYPERTENSION 07/12/2009    Home Medications Prior to Admission medications   Medication Sig Start Date End Date Taking? Authorizing Provider  acetaminophen (TYLENOL) 650 MG CR tablet Take 650 mg by mouth every 8 (eight) hours as needed for pain.   Yes [provider]  amLODipine (NORVASC) 5 MG tablet TAKE 1/2 TABLET BY MOUTH DAILY 08/26/22  Yes Dettinger, Elige Radon, MD  fenofibrate 160 MG tablet TAKE 1 TABLET BY MOUTH ONCE A DAY. Patient taking differently: Take 160 mg by mouth daily. 11/05/21  Yes Stacks, Broadus [name], MD  fish oil-omega-3 fatty acids 1000 MG capsule Take 2 g by mouth daily.   Yes [provider]  furosemide (LASIX) 20 MG tablet TAKE 1 TABLET BY MOUTH DAILY 07/22/22  Yes Mechele Claude, MD  Multiple Vitamins-Minerals (EYE VITAMINS & MINERALS PO) Take by mouth. [facility]   Yes [provider]  nabumetone (RELAFEN) 500 MG tablet TAKE 2 TABLETS BY MOUTH TWICE DAILY AS NEEDED FOR PAIN. Patient taking differently: Take 1,000 mg by mouth 2 (two) times daily as needed for moderate pain. 08/02/22  Yes Mechele Claude, MD  OVER THE COUNTER MEDICATION  Take 1 tablet by mouth daily. Vitamin B12   Yes [provider]  potassium chloride SA (KLOR-CON M) 20 MEQ tablet Take 1 tablet (20 mEq total) by mouth daily. 09/01/22  Yes Cathren Laine, MD  prednisoLONE acetate (PRED FORTE) 1 % ophthalmic suspension Place 1 drop into the left eye every other day. 08/12/22  Yes [provider]  Vitamin D, Ergocalciferol, (DRISDOL) 1.25 MG (50000 UNIT) CAPS capsule TAKE 1 CAPSULE BY MOUTH ONCE EVERY 7 DAYS. Patient taking differently: Take 50,000 Units by mouth every 7 (seven) days. 06/11/22  Yes Stacks, Broadus [name], MD  apixaban (ELIQUIS) 5 MG TABS tablet Take 1 tablet (5 mg total) by mouth 2 (two) times daily. Patient not taking: Reported on 09/05/2022 06/17/22   Mallipeddi, Orion Modest, MD      Allergies    Patient has no known allergies.    Review of Systems   Review of Systems  Musculoskeletal:  Positive for back pain.    Physical Exam Updated Vital Signs BP 139/70   Pulse 76   Temp 98.4 F (36.9 C) (Oral)   Resp 17   Ht 5\' 9"  (1.753 m)   Wt 79.4 kg   SpO2 95%   BMI 25.84 kg/m  Physical Exam Vitals reviewed.  HENT:     Head: Atraumatic.  Eyes:     Pupils: Pupils are equal, round, and reactive to light.  Neck:  Comments: Cervical collar in place.  Mild lower cervical spine tenderness.  No lumbar tenderness. Abdominal:     Tenderness: There is no abdominal tenderness.  Musculoskeletal:        General: Tenderness present.     Right lower leg: Edema present.     Left lower leg: Edema present.     Comments: Moderate pitting edema bilateral lower extremities.  Neurological:     Mental Status: He is alert. Mental status is at baseline.     ED Results / Procedures / Treatments   Labs (all labs ordered are listed, but only abnormal results are displayed) Labs Reviewed  URINALYSIS, W/ REFLEX TO CULTURE (INFECTION SUSPECTED) - Abnormal; Notable for the following components:      Result Value   APPearance CLOUDY (*)    Protein,  ur 30 (*)    Leukocytes,Ua LARGE (*)    Bacteria, UA RARE (*)    All other components within normal limits  COMPREHENSIVE METABOLIC PANEL - Abnormal; Notable for the following components:   Potassium 3.4 (*)    Total Protein 6.1 (*)    Albumin 2.4 (*)    All other components within normal limits  CBC WITH DIFFERENTIAL/PLATELET - Abnormal; Notable for the following components:   RBC 2.99 (*)    Hemoglobin 11.1 (*)    HCT 31.6 (*)    MCV 105.7 (*)    MCH 37.1 (*)    Platelets 141 (*)    All other components within normal limits  BRAIN NATRIURETIC PEPTIDE - Abnormal; Notable for the following components:   B Natriuretic Peptide 682.0 (*)    All other components within normal limits  URINE CULTURE    EKG None  Radiology CT ABDOMEN PELVIS W CONTRAST  Result Date: 09/05/2022 CLINICAL DATA:  Ongoing abdominal pain despite over-the-counter medications. History of C7 vertebral fracture and subdural hematoma after fall a few days ago. EXAM: CT ABDOMEN AND PELVIS WITH CONTRAST TECHNIQUE: Multidetector CT imaging of the abdomen and pelvis was performed using the standard protocol following bolus administration of intravenous contrast. RADIATION DOSE REDUCTION: This exam was performed according to the departmental dose-optimization program which includes automated exposure control, adjustment of the mA and/or kV according to patient size and/or use of iterative reconstruction technique. CONTRAST:  OMNIPAQUE IOHEXOL 300 MG/ML  SOLN COMPARISON:  Thoracic spine CT 09/01/2022 FINDINGS: Lower chest: Respiratory motion obscures the lung bases. Moderate right and small left pleural effusions. Bibasilar atelectasis. Advanced coronary artery calcification. Cardiomegaly. Hepatobiliary: Lobular area of hyperenhancement in the medial right hepatic lobe measuring 24 mm (2/30) which is isoattenuating to adjacent parenchyma on delayed phase is favored to represent a flash filling hemangioma. Additional small  hypoattenuating lesions in the liver are too small to definitively characterize but likely represent benign cysts. No evidence of hepatic injury. Unremarkable gallbladder and biliary tree. Pancreas: 2.1 cm cystic lesion in the pancreatic head (2/29). Additional smaller cystic lesions in the pancreatic tail measuring up to 8 mm (2/27). Mild atrophy of the pancreatic body and tail. No pancreatic ductal dilation. No evidence of pancreatitis. Spleen: Unremarkable. Adrenals/Urinary Tract: Normal adrenal glands. Bilateral cortical renal scarring greater on the left. No evidence of acute renal injury. No urinary calculi or hydronephrosis. Mild wall thickening and mucosal hyperenhancement of the bladder wall. Stomach/Bowel: Normal caliber large and small bowel. Extensive colonic diverticulosis in the left colon without evidence of diverticulitis. Stomach is within normal limits. Vascular/Lymphatic: Advanced arterial calcifications including the aorta. Left common iliac artery aneurysm  measuring 26 mm. Reproductive: Unremarkable. Other: Mesenteric edema and trace free fluid in the pelvis. No free intraperitoneal air. Musculoskeletal: Thoracolumbar spondylosis.  No acute fracture. IMPRESSION: 1. No acute abnormality in the abdomen or pelvis. 2. Moderate right and small left pleural effusions with bibasilar atelectasis. Pneumonia is difficult to exclude. 3. Bladder wall thickening and mucosal hyperenhancement, correlate with urinalysis for cystitis. 4. Extensive colonic diverticulosis without evidence of diverticulitis. 5. Left common iliac artery aneurysm measuring 26 mm. 6. Cystic lesions in the pancreas measuring up to 21 mm. Differential considerations include a pseudocyst or indolent neoplasm such as intraductal papillary mucinous tumor. If clinically appropriate given age and life expectancy, pre and post contrast abdominal MRI at 1 year could be considered. This recommendation follows ACR consensus guidelines: Managing  Incidental Findings on Abdominal CT: White Paper of the ACR Incidental Findings Committee. J Am Coll Radiol 2010;7:754-773. Aortic Atherosclerosis (ICD10-I70.0). Electronically Signed   By: Minerva Fester M.D.   On: 09/05/2022 21:34   CT Head Wo Contrast  Result Date: 09/05/2022 CLINICAL DATA:  Subdural hematoma follow-up EXAM: CT HEAD WITHOUT CONTRAST TECHNIQUE: Contiguous axial images were obtained from the base of the skull through the vertex without intravenous contrast. RADIATION DOSE REDUCTION: This exam was performed according to the departmental dose-optimization program which includes automated exposure control, adjustment of the mA and/or kV according to patient size and/or use of iterative reconstruction technique. COMPARISON:  Head CT 09/01/2022 FINDINGS: Brain: No acute territorial infarction is visualized. Thin right convexity subdural hematoma measuring up to 5 mm maximum thickness with resolution of previously noted small foci of more acute appearing blood. No new foci of acute hemorrhage. No mass effect or midline shift. Atrophy and chronic small vessel ischemic changes of the white matter. Stable ventricle size. Vascular: No hyperdense vessels.  Carotid vascular calcification Skull: Normal. Negative for fracture or focal lesion. Sinuses/Orbits: No acute finding. Other: Partially visualized fixating hardware at the dens. IMPRESSION: 1. Thin right convexity subdural hematoma/fluid collection measuring up to 5 mm maximum thickness with interval resolution of previously noted small foci of more acute appearing blood. No new foci of hemorrhage. No mass effect or midline shift. 2. Atrophy and chronic small vessel ischemic changes of the white matter. Electronically Signed   By: Jasmine Pang M.D.   On: 09/05/2022 19:02   DG Chest Portable 1 View  Result Date: 09/05/2022 CLINICAL DATA:  Weakness, history of fall with subdural hematoma EXAM: PORTABLE CHEST 1 VIEW COMPARISON:  09/01/2022 FINDINGS:  Single frontal view of the chest demonstrates an unremarkable cardiac silhouette. Hypoventilatory changes are seen at the lung bases. No acute airspace disease, effusion, or pneumothorax. No displaced fracture. IMPRESSION: 1. Bibasilar hypoventilatory changes.  No acute process. Electronically Signed   By: Sharlet Salina M.D.   On: 09/05/2022 18:34    Procedures Procedures    Medications Ordered in ED Medications  cefTRIAXone (ROCEPHIN) 1 g in sodium chloride 0.9 % 100 mL IVPB (has no administration in time range)  iohexol (OMNIPAQUE) 300 MG/ML solution 100 mL (100 mLs Intravenous Contrast Given 09/05/22 2111)    ED Course/ Medical Decision Making/ A&P                             Medical Decision Making Amount and/or Complexity of Data Reviewed Labs: ordered. Radiology: ordered.  Risk Prescription drug management.  Patient with back pain mid to lower back.  Also neck pain with recent cervical spine  fracture.  Also more confusion and more edema.  Differential diagnoses long.  Does have recent subdural.  Reviewed CT scan and ER note.  Potentially could have worsening of the bleeds will get head CT.  Also episodic back pain.  No lumbar tenderness but did have recent fall.  Will get basic blood work and urinalysis at this time.  May need CT scan to evaluate for intra-abdominal pathology or causes such as kidney stone.  Also has potentially worsening edema.  Lab work does show likely UTI.  Has had flank pain and some dysuria.  I think this may be a cause of his mental status change.  Also has hypoalbuminemia which appears to be somewhat chronic for him.  Has a fair amount of edema on his legs.  Head CT independently interpreted and is actually improved from previous intracranial hemorrhage.  However has had difficulty controlling pain in the back.  With mental status change and the pain I feel he benefit from mission to the hospital.  Will discuss with hospitalist.  Does not appear septic at this  time.        Final Clinical Impression(s) / ED Diagnoses Final diagnoses:  Lower urinary tract infectious disease  Encephalopathy    Rx / DC Orders ED Discharge Orders     None         Benjiman Core, MD 09/05/22 2307

## 2022-09-05 NOTE — ED Triage Notes (Signed)
Pt via POV w/son who states pt was seen here over the weekend and dx C7 vertebral fracture and subdural hematoma after sustaining a fall. Today pt is complaining of ongoing pain despite OTC medications q4-5 hours at home. Pt also says his vision is blurrier than normal, and son reports that pt has been saying things that are out of character.

## 2022-09-06 DIAGNOSIS — N390 Urinary tract infection, site not specified: Secondary | ICD-10-CM | POA: Insufficient documentation

## 2022-09-06 DIAGNOSIS — I4891 Unspecified atrial fibrillation: Secondary | ICD-10-CM | POA: Diagnosis not present

## 2022-09-06 DIAGNOSIS — E876 Hypokalemia: Secondary | ICD-10-CM | POA: Insufficient documentation

## 2022-09-06 DIAGNOSIS — G9341 Metabolic encephalopathy: Principal | ICD-10-CM

## 2022-09-06 DIAGNOSIS — I10 Essential (primary) hypertension: Secondary | ICD-10-CM

## 2022-09-06 DIAGNOSIS — E782 Mixed hyperlipidemia: Secondary | ICD-10-CM

## 2022-09-06 DIAGNOSIS — K8689 Other specified diseases of pancreas: Secondary | ICD-10-CM | POA: Insufficient documentation

## 2022-09-06 DIAGNOSIS — N3000 Acute cystitis without hematuria: Secondary | ICD-10-CM

## 2022-09-06 LAB — COMPREHENSIVE METABOLIC PANEL
ALT: 17 U/L (ref 0–44)
AST: 28 U/L (ref 15–41)
Albumin: 2.2 g/dL — ABNORMAL LOW (ref 3.5–5.0)
Alkaline Phosphatase: 63 U/L (ref 38–126)
Anion gap: 7 (ref 5–15)
BUN: 17 mg/dL (ref 8–23)
CO2: 24 mmol/L (ref 22–32)
Calcium: 8.8 mg/dL — ABNORMAL LOW (ref 8.9–10.3)
Chloride: 105 mmol/L (ref 98–111)
Creatinine, Ser: 0.84 mg/dL (ref 0.61–1.24)
GFR, Estimated: >60 mL/min (ref >60)
Glucose, Bld: 85 mg/dL (ref 70–99)
Potassium: 3 mmol/L — ABNORMAL LOW (ref 3.5–5.1)
Sodium: 136 mmol/L (ref 135–145)
Total Bilirubin: 0.9 mg/dL (ref 0.3–1.2)
Total Protein: 5.7 g/dL — ABNORMAL LOW (ref 6.5–8.1)

## 2022-09-06 LAB — CBC WITH DIFFERENTIAL/PLATELET
Abs Immature Granulocytes: 0.03 10*3/uL (ref 0.00–0.07)
Basophils Absolute: 0 10*3/uL (ref 0.0–0.1)
Basophils Relative: 1 %
Eosinophils Absolute: 0.1 10*3/uL (ref 0.0–0.5)
Eosinophils Relative: 3 %
HCT: 29.8 % — ABNORMAL LOW (ref 39.0–52.0)
Hemoglobin: 10.4 g/dL — ABNORMAL LOW (ref 13.0–17.0)
Immature Granulocytes: 1 %
Lymphocytes Relative: 30 %
Lymphs Abs: 1.4 10*3/uL (ref 0.7–4.0)
MCH: 36.5 pg — ABNORMAL HIGH (ref 26.0–34.0)
MCHC: 34.9 g/dL (ref 30.0–36.0)
MCV: 104.6 fL — ABNORMAL HIGH (ref 80.0–100.0)
Monocytes Absolute: 0.3 10*3/uL (ref 0.1–1.0)
Monocytes Relative: 7 %
Neutro Abs: 2.6 10*3/uL (ref 1.7–7.7)
Neutrophils Relative %: 58 %
Platelets: 149 10*3/uL — ABNORMAL LOW (ref 150–400)
RBC: 2.85 MIL/uL — ABNORMAL LOW (ref 4.22–5.81)
RDW: 14 % (ref 11.5–15.5)
WBC: 4.5 10*3/uL (ref 4.0–10.5)
nRBC: 0 % (ref 0.0–0.2)

## 2022-09-06 LAB — MAGNESIUM: Magnesium: 1.6 mg/dL — ABNORMAL LOW (ref 1.7–2.4)

## 2022-09-06 MED ORDER — MAGNESIUM SULFATE 2 GM/50ML IV SOLN
2.0000 g | Freq: Once | INTRAVENOUS | Status: AC
  Administered 2022-09-06: 2 g via INTRAVENOUS
  Filled 2022-09-06: qty 50

## 2022-09-06 MED ORDER — ACETAMINOPHEN 650 MG RE SUPP: 650.0000 mg | Freq: Four times a day (QID) | RECTAL | Status: DC | PRN

## 2022-09-06 MED ORDER — SODIUM CHLORIDE 0.9 % IV SOLN
1.0000 g | INTRAVENOUS | Status: DC
  Administered 2022-09-06: 1 g via INTRAVENOUS
  Filled 2022-09-06: qty 10

## 2022-09-06 MED ORDER — ACETAMINOPHEN 325 MG PO TABS: 650.0000 mg | ORAL_TABLET | Freq: Four times a day (QID) | ORAL | Status: DC | PRN

## 2022-09-06 MED ORDER — AMLODIPINE BESYLATE 5 MG PO TABS
2.5000 mg | ORAL_TABLET | Freq: Every day | ORAL | Status: DC
  Administered 2022-09-06 – 2022-09-07 (×2): 2.5 mg via ORAL
  Filled 2022-09-06 (×2): qty 1

## 2022-09-06 MED ORDER — FENOFIBRATE 160 MG PO TABS
160.0000 mg | ORAL_TABLET | Freq: Every day | ORAL | Status: DC
  Administered 2022-09-06 – 2022-09-07 (×2): 160 mg via ORAL
  Filled 2022-09-06 (×2): qty 1

## 2022-09-06 MED ORDER — POTASSIUM CHLORIDE 20 MEQ PO PACK
20.0000 meq | PACK | Freq: Once | ORAL | Status: AC
  Administered 2022-09-06: 20 meq via ORAL
  Filled 2022-09-06: qty 1

## 2022-09-06 MED ORDER — FUROSEMIDE 20 MG PO TABS
20.0000 mg | ORAL_TABLET | Freq: Every day | ORAL | Status: DC
  Administered 2022-09-06 – 2022-09-07 (×2): 20 mg via ORAL
  Filled 2022-09-06 (×2): qty 1

## 2022-09-06 MED ORDER — OXYCODONE HCL 5 MG PO TABS
5.0000 mg | ORAL_TABLET | ORAL | Status: DC | PRN
  Administered 2022-09-06 – 2022-09-07 (×2): 5 mg via ORAL
  Filled 2022-09-06 (×2): qty 1

## 2022-09-06 MED ORDER — MORPHINE SULFATE (PF) 2 MG/ML IV SOLN: 2.0000 mg | INTRAVENOUS | Status: DC | PRN

## 2022-09-06 MED ORDER — ONDANSETRON HCL 4 MG PO TABS: 4.0000 mg | ORAL_TABLET | Freq: Four times a day (QID) | ORAL | Status: DC | PRN

## 2022-09-06 MED ORDER — ONDANSETRON HCL 4 MG/2ML IJ SOLN: 4.0000 mg | Freq: Four times a day (QID) | INTRAMUSCULAR | Status: DC | PRN

## 2022-09-06 NOTE — Plan of Care (Signed)

## 2022-09-06 NOTE — Evaluation (Signed)
Physical Therapy Evaluation Patient Details Name: Brett Blankenship MRN: [id number] DOB: 09-26-1926 Today's Date: 09/06/2022  History of Present Illness  Brett Blankenship is a 89 y.o. male with medical history significant of recent subdural hematoma, hypertension, mixed hyperlipidemia, atrial fibrillation who is on Eliquis at baseline but that is being held due to recent subdural hematoma, and more presents the ED with a chief complaint of altered mental status.  Patient is a poor historian.  He reports the reason he is here is because of his fall 2 weeks ago.  He reports that he did hit his head, any does have pain in his neck.  He is aware that he is in the hospital, aware that he has a c-collar on.  Does not remember being confused at home.  He does continually go on about being able to sleep tonight which she has not been able to do at home.  He is not able to provide more history than that.  It is reported that patient had ongoing pain, blurry vision, and was acting confused so his son brought him into the ED.  Patient has no other complaints at this time.   Clinical Impression  Patient unable to tolerate HOB flat due to increased back pain, fair/good return for sitting up at bedside using bed rail with HOB raised approximately 45 degrees, good return for transferring to/from chair and ambulating in room/hallway using RW without loss of balance.  Patient tolerated sitting up in chair after therapy - nursing staff notified.  Patient will benefit from continued skilled physical therapy in hospital and recommended venue below to increase strength, balance, endurance for safe ADLs and gait.         Recommendations for follow up therapy are one component of a multi-disciplinary discharge planning process, led by the attending physician.  Recommendations may be updated based on patient status, additional functional criteria and insurance authorization.  Follow Up Recommendations       Assistance  Recommended at Discharge Set up Supervision/Assistance  Patient can return home with the following  A little help with walking and/or transfers;A little help with bathing/dressing/bathroom;Assistance with cooking/housework;Help with stairs or ramp for entrance    Equipment Recommendations None recommended by PT  Recommendations for Other Services       Functional Status Assessment Patient has had a recent decline in their functional status and demonstrates the ability to make significant improvements in function in a reasonable and predictable amount of time.     Precautions / Restrictions Precautions Precautions: Fall Restrictions Weight Bearing Restrictions: No      Mobility  Bed Mobility Overal bed mobility: Needs Assistance Bed Mobility: Supine to Sit     Supine to sit: HOB elevated, Supervision     General bed mobility comments: increased time requiring use of bed rail    Transfers Overall transfer level: Needs assistance Equipment used: Rolling walker (2 wheels) Transfers: Sit to/from Stand, Bed to chair/wheelchair/BSC Sit to Stand: Supervision   Step pivot transfers: Supervision, Min guard       General transfer comment: slightly labored movement without loss of balance    Ambulation/Gait Ambulation/Gait assistance: Supervision Gait Distance (Feet): 100 Feet Assistive device: Rolling walker (2 wheels) Gait Pattern/deviations: Decreased step length - right, Decreased step length - left, Decreased stride length Gait velocity: decreased     General Gait Details: slightly labored cadence without loss of balance, good return for making turns using RW, limited due to fatigue  Stairs  Wheelchair Mobility    Modified Rankin (Stroke Patients Only)       Balance Overall balance assessment: Needs assistance Sitting-balance support: Feet supported, No upper extremity supported Sitting balance-Leahy Scale: Good Sitting balance - Comments:  seated at EOB   Standing balance support: During functional activity, No upper extremity supported Standing balance-Leahy Scale: Fair Standing balance comment: fair/good using RW                             Pertinent Vitals/Pain Pain Assessment Pain Assessment: Faces Faces Pain Scale: Hurts little more Pain Location: low back when HOB lowered Pain Descriptors / Indicators: Sore, Grimacing Pain Intervention(s): Limited activity within patient's tolerance, Monitored during session, Repositioned    Home Living Family/patient expects to be discharged to:: Private residence Living Arrangements: Children Available Help at Discharge: Family;Available 24 hours/day Type of Home: Mobile home Home Access: Ramped entrance       Home Layout: One level Home Equipment: Cane - quad;Cane - single point;Shower seat;Other (comment);Rolling Walker (2 wheels)      Prior Function Prior Level of Function : Needs assist       Physical Assist : ADLs (physical);Mobility (physical) Mobility (physical): Bed mobility;Transfers;Gait;Stairs   Mobility Comments: household and short distanced community ambulator using RW ADLs Comments: Assisted by family     Hand Dominance   Dominant Hand: Right    Extremity/Trunk Assessment   Upper Extremity Assessment Upper Extremity Assessment: Overall WFL for tasks assessed    Lower Extremity Assessment Lower Extremity Assessment: Generalized weakness    Cervical / Trunk Assessment Cervical / Trunk Assessment: Normal  Communication   Communication: No difficulties  Cognition Arousal/Alertness: Awake/alert Behavior During Therapy: WFL for tasks assessed/performed Overall Cognitive Status: Within Functional Limits for tasks assessed                                          General Comments      Exercises     Assessment/Plan    PT Assessment Patient needs continued PT services  PT Problem List Decreased  strength;Decreased activity tolerance;Decreased balance;Decreased mobility       PT Treatment Interventions DME instruction;Gait training;Stair training;Functional mobility training;Therapeutic activities;Therapeutic exercise;Patient/family education;Balance training    PT Goals (Current goals can be found in the Care Plan section)  Acute Rehab PT Goals Patient Stated Goal: return home with family to assist PT Goal Formulation: With patient Time For Goal Achievement: 09/09/22 Potential to Achieve Goals: Good    Frequency Min 3X/week     Co-evaluation               AM-PAC PT "6 Clicks" Mobility  Outcome Measure Help needed turning from your back to your side while in a flat bed without using bedrails?: None Help needed moving from lying on your back to sitting on the side of a flat bed without using bedrails?: A Little Help needed moving to and from a bed to a chair (including a wheelchair)?: None Help needed standing up from a chair using your arms (e.g., wheelchair or bedside chair)?: None Help needed to walk in hospital room?: A Little Help needed climbing 3-5 steps with a railing? : A Little 6 Click Score: 21    End of Session   Activity Tolerance: Patient tolerated treatment well;Patient limited by fatigue Patient left: in chair;with call bell/phone within  reach;with chair alarm set Nurse Communication: Mobility status PT Visit Diagnosis: Unsteadiness on feet (R26.81);Other abnormalities of gait and mobility (R26.89);Muscle weakness (generalized) (M62.81)    Time: 1610-9604 PT Time Calculation (min) (ACUTE ONLY): 25 min   Charges:   PT Evaluation $PT Eval Moderate Complexity: 1 Mod PT Treatments $Therapeutic Activity: 23-37 mins        3:40 PM, 09/06/22 Ocie Bob, MPT Physical Therapist with [facility] [phone] office [phone] mobile phone

## 2022-09-06 NOTE — Care Management Obs Status (Signed)
MEDICARE OBSERVATION STATUS NOTIFICATION   Patient Details  Name: VINN ZANER MRN: [id number] Date of Birth: 1926-09-03   Medicare Observation Status Notification Given:  Yes    Corey Harold 09/06/2022, 3:41 PM

## 2022-09-06 NOTE — Assessment & Plan Note (Signed)
-   CT abdomen and pelvis shows cystic lesions in the pancreas measuring up to 21 mm.  Patient will likely need outpatient follow-up with possible MRI abdomen - No abdominal pain complaints at this time

## 2022-09-06 NOTE — H&P (Signed)
History and Physical    Patient: Brett Blankenship ZOX:[id number] DOB: 07/12/1926 DOA: 09/05/2022 DOS: the patient was seen and examined on 09/06/2022 PCP: Mechele Claude, MD  Patient coming from: Home  Chief Complaint:  Chief Complaint  Patient presents with   Back Pain   HPI: Brett Blankenship is a 89 y.o. male with medical history significant of recent subdural hematoma, hypertension, mixed hyperlipidemia, atrial fibrillation who is on Eliquis at baseline but that is being held due to recent subdural hematoma, and more presents the ED with a chief complaint of altered mental status.  Patient is a poor historian.  He reports the reason he is here is because of his fall 2 weeks ago.  He reports that he did hit his head, any does have pain in his neck.  He is aware that he is in the hospital, aware that he has a c-collar on.  Does not remember being confused at home.  He does continually go on about being able to sleep tonight which she has not been able to do at home.  He is not able to provide more history than that.  It is reported that patient had ongoing pain, blurry vision, and was acting confused so his son brought him into the ED.  Patient has no other complaints at this time.  Patient reports he does not smoke, does not drink.  He is not vaccinated for COVID.  Given that he is confused we did not discuss CODE STATUS. Review of Systems: unable to review all systems due to the inability of the patient to answer questions. Past Medical History:  Diagnosis Date   HYPERTENSION 07/12/2009   Past Surgical History:  Procedure Laterality Date   APPENDECTOMY  1960   NECK SURGERY     Social History:  reports that he quit smoking about 59 years ago. His smoking use included cigarettes. He has a 1.50 pack-year smoking history. He has never used smokeless tobacco. He reports that he does not currently use alcohol. He reports that he does not currently use drugs.  No Known Allergies  Family  History  Problem Relation Age of Onset   Hypertension Father    Heart disease Father    Cancer Brother        colon, lung    Prior to Admission medications   Medication Sig Start Date End Date Taking? Authorizing Provider  acetaminophen (TYLENOL) 650 MG CR tablet Take 650 mg by mouth every 8 (eight) hours as needed for pain.   Yes [provider]  amLODipine (NORVASC) 5 MG tablet TAKE 1/2 TABLET BY MOUTH DAILY 08/26/22  Yes Dettinger, Elige Radon, MD  fenofibrate 160 MG tablet TAKE 1 TABLET BY MOUTH ONCE A DAY. Patient taking differently: Take 160 mg by mouth daily. 11/05/21  Yes Stacks, Broadus [name], MD  fish oil-omega-3 fatty acids 1000 MG capsule Take 2 g by mouth daily.   Yes [provider]  furosemide (LASIX) 20 MG tablet TAKE 1 TABLET BY MOUTH DAILY 07/22/22  Yes Mechele Claude, MD  Multiple Vitamins-Minerals (EYE VITAMINS & MINERALS PO) Take by mouth. [facility]   Yes [provider]  nabumetone (RELAFEN) 500 MG tablet TAKE 2 TABLETS BY MOUTH TWICE DAILY AS NEEDED FOR PAIN. Patient taking differently: Take 1,000 mg by mouth 2 (two) times daily as needed for moderate pain. 08/02/22  Yes Mechele Claude, MD  OVER THE COUNTER MEDICATION Take 1 tablet by mouth daily. Vitamin B12   Yes [provider]  potassium chloride SA (KLOR-CON M) 20 MEQ tablet Take 1 tablet (20 mEq total) by mouth daily. 09/01/22  Yes Cathren Laine, MD  prednisoLONE acetate (PRED FORTE) 1 % ophthalmic suspension Place 1 drop into the left eye every other day. 08/12/22  Yes [provider]  Vitamin D, Ergocalciferol, (DRISDOL) 1.25 MG (50000 UNIT) CAPS capsule TAKE 1 CAPSULE BY MOUTH ONCE EVERY 7 DAYS. Patient taking differently: Take 50,000 Units by mouth every 7 (seven) days. 06/11/22  Yes Stacks, Broadus [name], MD  apixaban (ELIQUIS) 5 MG TABS tablet Take 1 tablet (5 mg total) by mouth 2 (two) times daily. Patient not taking: Reported on 09/05/2022 06/17/22   Marjo Bicker, MD     Physical Exam: Vitals:   09/05/22 2030 09/05/22 2100 09/06/22 0052 09/06/22 0352  BP: (!) 186/74 139/70 (!) 178/78 (!) 149/84  Pulse: 85 76 81 81  Resp: 17 17 18 18   Temp:   (!) 97.5 F (36.4 C) 98.3 F (36.8 C)  TempSrc:      SpO2: 94% 95% 97% 97%  Weight:      Height:       1.  General: Patient lying supine in bed,  no acute distress   2. Psychiatric: Alert and oriented x self and place, mood and behavior normal for situation, pleasant and cooperative with exam   3. Neurologic: Speech and language are normal, face is symmetric, moves all 4 extremities voluntarily, at baseline without acute deficits on limited exam   4. HEENMT:  Head is atraumatic, normocephalic, pupils reactive to light, c-collar in place, trachea is midline, mucous membranes are moist   5. Respiratory : Lungs are clear to auscultation bilaterally without wheezing, rhonchi, rales, no cyanosis, no increase in work of breathing or accessory muscle use   6. Cardiovascular : Heart rate normal, rhythm is regular, murmur present, rubs or gallops, bilateral lower extremity peripheral edema peripheral pulses palpated   7. Gastrointestinal:  Abdomen is soft, nondistended, nontender to palpation bowel sounds active, no masses or organomegaly palpated   8. Skin:  Skin is warm, dry and intact without rashes, acute lesions, or ulcers on limited exam   9.Musculoskeletal:  No acute deformities or trauma, no asymmetry in tone, lateral lower extremity peripheral edema,, peripheral pulses palpated, no tenderness to palpation in the extremities  Data Reviewed: In the ED Temp 98.4, heart rate 76-106, respiratory rate 17-19, blood pressure 131/70-186/77, satting 94-98% Leukopenia at 4.5, hemoglobin stable 11.1 Mild hyponatremia at 3.4 Albumin low at 2.4 BNP elevated 682 UA is indicative of UTI Urine culture pending CT abdomen pelvis shows acute cystitis and possible pancreatic mass CT head shows no new foci of  hemorrhage Admission requested for altered mental status, and generalized weakness, UTI  Assessment and Plan: * Acute metabolic encephalopathy - Repeat CT head today shows interval resolution of previously noted small foci of more acute appearing blood.  No new foci of hemorrhage.  Atrophy and chronic small vessel ischemic changes of white matter. - UA indicative of UTI likely contributing to confusion - Patient's baseline is unclear as there is no family at bedside this time - Continue to monitor  A-fib (HCC) - Holding Eliquis in the setting of recent subdural hematoma - Currently rate controlled - Continue to monitor  Essential hypertension - Continue Norvasc - Continue to monitor  Mixed hyperlipidemia - Continue fenofibrate  Hypokalemia - Potassium 3.4 - Replace with 20 mEq of potassium - Trend in the a.m.  UTI (urinary tract infection) - CT  abdomen pelvis shows acute cystitis - UA indicative of UTI - Borderline leukopenia at 4.5 - Urine culture pending - Start Rocephin - Continue to monitor  Pancreatic mass - CT abdomen and pelvis shows cystic lesions in the pancreas measuring up to 21 mm.  Patient will likely need outpatient follow-up with possible MRI abdomen - No abdominal pain complaints at this time      Advance Care Planning:   Code Status: Full Code  Consults: None at this time  Family Communication: No family at bedside  Severity of Illness: The appropriate patient status for this patient is OBSERVATION. Observation status is judged to be reasonable and necessary in order to provide the required intensity of service to ensure the patient's safety. The patient's presenting symptoms, physical exam findings, and initial radiographic and laboratory data in the context of their medical condition is felt to place them at decreased risk for further clinical deterioration. Furthermore, it is anticipated that the patient will be medically stable for discharge from  the hospital within 2 midnights of admission.   Author: Lilyan Gilford, DO 09/06/2022 5:51 AM  For on call review [email or web address].

## 2022-09-06 NOTE — Assessment & Plan Note (Signed)
-   Potassium 3.4 - Replace with 20 mEq of potassium - Trend in the a.m.

## 2022-09-06 NOTE — Progress Notes (Signed)
[facility] PROGRESS NOTE  Brett Blankenship (DOB: 1926/10/10) WGN:[id number] PCP: Mechele Claude, MD  Brief Narrative: Brett Blankenship is a 89 y.o. male with a history of recent SDH, cervical spine (C7) fracture, HTN, HLD, AFib holding eliquis who presented to the ED on 09/05/2022 with confusion found to have UTI and admitted this morning on ceftriaxone with culture pending.   Subjective: Eating breakfast, feels fine, aware there was some confusion but is oriented to recent events, date, situation, place. Has birthday on [birth year]. Did have some burning with urination but no fever or chills.  Objective: BP (!) 140/76 (BP Location: Right Arm)   Pulse 83   Temp 97.7 F (36.5 C) (Oral)   Resp 18   Ht 5\' 9"  (1.753 m)   Wt 79.4 kg   SpO2 99%   BMI 25.84 kg/m   Gen: Elderly pleasant talkative male in no distress Neck: Cervical collar in place Pulm: Clear, diminished at bases, no wheezes or crackles.  CV: Irr irr with II/VI systolic murmur at the base, 1+ pitting dependent edema.  GI: Soft, NT, ND, +BS  Neuro: Alert and oriented. No new focal deficits. Ext: Warm, no deformities. Skin: No rashes, lesions or ulcers on visualized skin   Assessment & Plan: UTI: Pyuria, bacteriuria and pt now admits to burning with urination. CT showed bladder wall thickening. WBC and temperature ok, no sepsis. - Continue ceftriaxone and monitor urine culture (sent and pending)  Acute metabolic encephalopathy: This appears to have improved considerably since admission earlier this morning, so may have been due to UTI.  - Continue monitoring, get PT/OT as he'll be returning home alone he says (Son lives nearby). Possible to DC 5/11.   Edema, moderate right, small left pleural effusions: Echo Jan 2024 with eccentric AI, felt to be severe, though LVEF 60-65%. - Continue lasix 20mg  daily, though does need preload. Hypoalbuminemia is contributing to signs/symptoms as well. He is not hypoxemic with no  infiltrate or pulmonary edema.  - Continue norvasc for HTN, though this may also contribute to leg swelling.  PAF: ECG this admit shows rate-controlled AFib with IVCD similar to prior. Consistent with LVH seen on previous echo related to AI most likely. - Holding DOAC with SDH pending neurosurgery reevaluation.  - Not on regular rate controller medication - Continue routine follow up with cardiology, Dr. Jenene Slicker  Hypokalemia:  - Supplement and monitor (give additional now)  Hypomagnesemia:  - Supplement   Macrocytic anemia, thrombocytopenia: Stable and chronic. Folic acid and B12 wnl.   C7 fracture: Sustained during fall about 2 weeks ago.  - Continue cervical collar as recommended by curbside neurosurgery consult from ED on 5/5 and follow up in neurosurgery clinic in about 1 week (2 weeks from initial visit).   SDH: Stable to improving on repeat CT as above.  - Continue holding eliquis for now. Neurosurgery follow up as above  Cystic pancreatic lesions: Seen incidentally on CT A/P. LFTs normal. DDx includes indolent neoplasm and pseudocyst.   - Consider MRI at follow up if patient wishes to further work this up  Left common iliac artery aneurysm measuring 26 mm: Also noted aortic dilatation at the aortic root on recent echo to 43mm, and ascending aorta to 49mm. - Outpatient follow up.   Tyrone Nine, MD [facility] [email or web address] 09/06/2022, 11:44 AM

## 2022-09-06 NOTE — Assessment & Plan Note (Signed)
-   Repeat CT head today shows interval resolution of previously noted small foci of more acute appearing blood.  No new foci of hemorrhage.  Atrophy and chronic small vessel ischemic changes of white matter. - UA indicative of UTI likely contributing to confusion - Patient's baseline is unclear as there is no family at bedside this time - Continue to monitor

## 2022-09-06 NOTE — Assessment & Plan Note (Signed)
Continue fenofibrate 

## 2022-09-06 NOTE — Plan of Care (Signed)
  Problem: Acute Rehab PT Goals(only PT should resolve) Goal: Pt Will Go Supine/Side To Sit Outcome: Progressing Flowsheets (Taken 09/06/2022 1541) Pt will go Supine/Side to Sit:  with modified independence  with supervision Goal: Patient Will Transfer Sit To/From Stand Outcome: Progressing Flowsheets (Taken 09/06/2022 1541) Patient will transfer sit to/from stand:  with modified independence  with supervision Goal: Pt Will Transfer Bed To Chair/Chair To Bed Outcome: Progressing Flowsheets (Taken 09/06/2022 1541) Pt will Transfer Bed to Chair/Chair to Bed:  with modified independence  with supervision Goal: Pt Will Ambulate Outcome: Progressing Flowsheets (Taken 09/06/2022 1541) Pt will Ambulate:  > 125 feet  with modified independence  with supervision  with rolling walker   3:42 PM, 09/06/22 Ocie Bob, MPT Physical Therapist with [facility] [phone] office [phone] mobile phone

## 2022-09-06 NOTE — Assessment & Plan Note (Signed)
-   CT abdomen pelvis shows acute cystitis - UA indicative of UTI - Borderline leukopenia at 4.5 - Urine culture pending - Start Rocephin - Continue to monitor

## 2022-09-06 NOTE — Assessment & Plan Note (Signed)
-   Continue Norvasc - Continue to monitor 

## 2022-09-06 NOTE — Progress Notes (Signed)
  Transition of Care [facility]) Screening Note   Patient Details  Name: Brett Blankenship Date of Birth: 26-Jul-1926   [facility]) CM/SW Contact:    Villa Herb, LCSWA Phone Number: 09/06/2022, 2:14 PM    Transition of Care Department [facility]) has reviewed patient and no TOC needs have been identified at this time. We will continue to monitor patient advancement through interdisciplinary progression rounds. If new patient transition needs arise, please place a TOC consult.

## 2022-09-06 NOTE — Assessment & Plan Note (Signed)
-   Holding Eliquis in the setting of recent subdural hematoma - Currently rate controlled - Continue to monitor

## 2022-09-07 DIAGNOSIS — G9341 Metabolic encephalopathy: Secondary | ICD-10-CM | POA: Diagnosis not present

## 2022-09-07 MED ORDER — MAGNESIUM SULFATE 2 GM/50ML IV SOLN: 2.0000 g | Freq: Once | INTRAVENOUS | Status: DC

## 2022-09-07 MED ORDER — POTASSIUM CHLORIDE 20 MEQ PO PACK: 40.0000 meq | PACK | Freq: Once | ORAL | Status: DC

## 2022-09-07 MED ORDER — CEPHALEXIN 500 MG PO CAPS: 500.0000 mg | ORAL_CAPSULE | Freq: Two times a day (BID) | ORAL | 0 refills | Status: AC

## 2022-09-07 NOTE — Progress Notes (Signed)
Patient complained of pain, stated he didn't rest last night and was very uncomfortable. Expressed feelings of hopelessness. Provided pain medication and encouragement.

## 2022-09-07 NOTE — Progress Notes (Signed)
Patient son Jorja Loa given a brief update on the patient. Patient reports pain has improved with medication.

## 2022-09-07 NOTE — Discharge Summary (Addendum)
Physician Discharge Summary   Patient: Brett Blankenship MRN: [id number] DOB: 28-Dec-1926  Admit date:     09/05/2022  Discharge date: 09/07/22  Discharge Physician: Tyrone Nine   PCP: Mechele Claude, MD   Recommendations at discharge:  Follow up with PCP in 1-2 weeks with repeat BMP and CBC suggested.  Follow up with neurosurgery for SDH (holding DOAC) and C7 fracture, in about 1 week.  Follow up with cardiology, Dr. Jenene Slicker for aortic stenosis, HTN, PAF.  Note incidental imaging findings below which could be considered for follow up imaging.   Discharge Diagnoses: Principal Problem:   Acute metabolic encephalopathy Active Problems:   A-fib (HCC)   Essential hypertension   Mixed hyperlipidemia   Pancreatic mass   UTI (urinary tract infection)   Hypokalemia  Hospital Course: Brett Blankenship is a 89 y.o. male with a history of recent SDH, cervical spine (C7) fracture, HTN, HLD, AFib holding eliquis who presented to the ED on 09/05/2022 with confusion found to have UTI and admitted on ceftriaxone. His mental status quickly improved and he is back at full orientation. No fever or other evidence of sepsis, so he's stable to discharge on oral antibiotics.  Assessment and Plan: UTI: Pyuria, bacteriuria and pt now admits to burning with urination. CT showed bladder wall thickening, no pyelonephritis or abscess. WBC and temperature normal, no sepsis. - Improved on ceftriaxone, will complete the course with keflex. Urine culture sent with no results at the time of discharge. I will follow these up and modify antibiotics if needed based on those results. ADDENDUM: Culture grew Enterococcus pansensitive but wouldn't be covered with keflex. Called and spoke with pt and his son, confirmed he's doing just fine, much better than when he first came up here, no fevers, already followed up with Dr. Franky Macho today. He does still have intermittent burning with urination, so I do believe we can treat this  with amoxicillin 500mg  BID which I called into his [facility] pharmacy, advised of course to stop the keflex.  Acute metabolic encephalopathy: This appears to have improved considerably since admission, so may have been due to UTI.  - Remains stable, will arrange home health to maximize support at home. He's happy he'll be going home today and looking forward to his 89th birthday on Tuesday.   Edema, moderate right, small left pleural effusions: Echo Jan 2024 with eccentric AI, felt to be severe, though LVEF 60-65%. - Continue lasix 20mg  daily, though does need preload. Hypoalbuminemia is contributing to signs/symptoms as well. He is not hypoxemic with no infiltrate or pulmonary edema and no respiratory symptoms.  - Continue norvasc for HTN, though this may also contribute to leg swelling.   PAF: ECG this admit shows rate-controlled AFib with IVCD similar to prior. Consistent with LVH seen on previous echo related to AI most likely. - Holding DOAC with SDH pending neurosurgery reevaluation.  - Not on regular rate controller medication - Continue routine follow up with cardiology, Dr. Jenene Slicker   Hypokalemia:  - Supplemented. Will continue home standing supplementation and will need monitoring labs at discharge.    Hypomagnesemia:  - Supplemented   Macrocytic anemia, thrombocytopenia: Stable and chronic. Folic acid and B12 wnl.    C7 fracture: Sustained during fall about 2 weeks ago.  - Continue cervical collar as recommended by curbside neurosurgery consult from ED on 5/5 and follow up in neurosurgery clinic in about 1 week (2 weeks from initial visit).    SDH: Stable to improving  on repeat CT as above.  - Continue holding eliquis for now. Neurosurgery follow up as above   Cystic pancreatic lesions: Seen incidentally on CT A/P. LFTs normal. DDx includes indolent neoplasm and pseudocyst.   - Consider MRI at follow up if patient wishes to further work this up   Left common iliac artery  aneurysm measuring 26 mm: Also noted aortic dilatation at the aortic root on recent echo to 43mm, and ascending aorta to 49mm. - Outpatient follow up.   Consultants: None Procedures performed: None  Disposition: Home Diet recommendation: As tolerated DISCHARGE MEDICATION: Allergies as of 09/07/2022   No Known Allergies      Medication List     STOP taking these medications    apixaban 5 MG Tabs tablet Commonly known as: ELIQUIS       TAKE these medications    acetaminophen 650 MG CR tablet Commonly known as: TYLENOL Take 650 mg by mouth every 8 (eight) hours as needed for pain.   amLODipine 5 MG tablet Commonly known as: NORVASC TAKE 1/2 TABLET BY MOUTH DAILY   cephALEXin 500 MG capsule Commonly known as: KEFLEX Take 1 capsule (500 mg total) by mouth 2 (two) times daily for 5 days.   EYE VITAMINS & MINERALS PO Take by mouth. [employer]   fenofibrate 160 MG tablet TAKE 1 TABLET BY MOUTH ONCE A DAY.   fish oil-omega-3 fatty acids 1000 MG capsule Take 2 g by mouth daily.   furosemide 20 MG tablet Commonly known as: LASIX TAKE 1 TABLET BY MOUTH DAILY   nabumetone 500 MG tablet Commonly known as: RELAFEN TAKE 2 TABLETS BY MOUTH TWICE DAILY AS NEEDED FOR PAIN. What changed: See the new instructions.   OVER THE COUNTER MEDICATION Take 1 tablet by mouth daily. Vitamin B12   potassium chloride SA 20 MEQ tablet Commonly known as: KLOR-CON M Take 1 tablet (20 mEq total) by mouth daily.   prednisoLONE acetate 1 % ophthalmic suspension Commonly known as: PRED FORTE Place 1 drop into the left eye every other day.   Vitamin D (Ergocalciferol) 1.25 MG (50000 UNIT) Caps capsule Commonly known as: DRISDOL TAKE 1 CAPSULE BY MOUTH ONCE EVERY 7 DAYS. What changed: See the new instructions.        Follow-up Information     Stacks, Broadus [name], MD. Schedule an appointment as soon as possible for a visit in 1 week(s).   Specialty: Family Medicine Contact  information: [address] [place] [address] [phone]         Lisbeth Renshaw, MD Follow up.   Specialty: Neurosurgery Why: Please call to arrange a follow up appointment in 1 week for the cervical spine fracture and subdural hematoma (both stable on repeat imaging this admission) Contact information: [address] [place] [address] [phone]                Discharge Exam: Brett Blankenship Weights   09/05/22 1655  Weight: 79.4 kg  BP (!) 161/79 (BP Location: Left Arm)   Pulse 91   Temp 98.5 F (36.9 C)   Resp 20   Ht 5\' 9"  (1.753 m)   Wt 79.4 kg   SpO2 98%   BMI 25.84 kg/m   Elderly male sitting in chair in no distress. Remembers me and our conversation, would prefer to go home today. Has a ride and assistance from his son, Jorja Loa. He denies any urinary symptoms or abdominal pain or fever. Feels he is operating at  his baseline. Ate a good breakfast. Pain is controlled in the neck.  Nonlabored Irreg irreg, trace pitting leg edema.   Condition at discharge: stable  The results of significant diagnostics from this hospitalization (including imaging, microbiology, ancillary and laboratory) are listed below for reference.   Imaging Studies: CT ABDOMEN PELVIS W CONTRAST  Result Date: 09/05/2022 CLINICAL DATA:  Ongoing abdominal pain despite over-the-counter medications. History of C7 vertebral fracture and subdural hematoma after fall a few days ago. EXAM: CT ABDOMEN AND PELVIS WITH CONTRAST TECHNIQUE: Multidetector CT imaging of the abdomen and pelvis was performed using the standard protocol following bolus administration of intravenous contrast. RADIATION DOSE REDUCTION: This exam was performed according to the departmental dose-optimization program which includes automated exposure control, adjustment of the mA and/or kV according to patient size and/or use of iterative reconstruction technique. CONTRAST:  OMNIPAQUE IOHEXOL 300 MG/ML  SOLN  COMPARISON:  Thoracic spine CT 09/01/2022 FINDINGS: Lower chest: Respiratory motion obscures the lung bases. Moderate right and small left pleural effusions. Bibasilar atelectasis. Advanced coronary artery calcification. Cardiomegaly. Hepatobiliary: Lobular area of hyperenhancement in the medial right hepatic lobe measuring 24 mm (2/30) which is isoattenuating to adjacent parenchyma on delayed phase is favored to represent a flash filling hemangioma. Additional small hypoattenuating lesions in the liver are too small to definitively characterize but likely represent benign cysts. No evidence of hepatic injury. Unremarkable gallbladder and biliary tree. Pancreas: 2.1 cm cystic lesion in the pancreatic head (2/29). Additional smaller cystic lesions in the pancreatic tail measuring up to 8 mm (2/27). Mild atrophy of the pancreatic body and tail. No pancreatic ductal dilation. No evidence of pancreatitis. Spleen: Unremarkable. Adrenals/Urinary Tract: Normal adrenal glands. Bilateral cortical renal scarring greater on the left. No evidence of acute renal injury. No urinary calculi or hydronephrosis. Mild wall thickening and mucosal hyperenhancement of the bladder wall. Stomach/Bowel: Normal caliber large and small bowel. Extensive colonic diverticulosis in the left colon without evidence of diverticulitis. Stomach is within normal limits. Vascular/Lymphatic: Advanced arterial calcifications including the aorta. Left common iliac artery aneurysm measuring 26 mm. Reproductive: Unremarkable. Other: Mesenteric edema and trace free fluid in the pelvis. No free intraperitoneal air. Musculoskeletal: Thoracolumbar spondylosis.  No acute fracture. IMPRESSION: 1. No acute abnormality in the abdomen or pelvis. 2. Moderate right and small left pleural effusions with bibasilar atelectasis. Pneumonia is difficult to exclude. 3. Bladder wall thickening and mucosal hyperenhancement, correlate with urinalysis for cystitis. 4. Extensive  colonic diverticulosis without evidence of diverticulitis. 5. Left common iliac artery aneurysm measuring 26 mm. 6. Cystic lesions in the pancreas measuring up to 21 mm. Differential considerations include a pseudocyst or indolent neoplasm such as intraductal papillary mucinous tumor. If clinically appropriate given age and life expectancy, pre and post contrast abdominal MRI at 1 year could be considered. This recommendation follows ACR consensus guidelines: Managing Incidental Findings on Abdominal CT: White Paper of the ACR Incidental Findings Committee. J Am Coll Radiol 2010;7:754-773. Aortic Atherosclerosis (ICD10-I70.0). Electronically Signed   By: Minerva Fester M.D.   On: 09/05/2022 21:34   CT Head Wo Contrast  Result Date: 09/05/2022 CLINICAL DATA:  Subdural hematoma follow-up EXAM: CT HEAD WITHOUT CONTRAST TECHNIQUE: Contiguous axial images were obtained from the base of the skull through the vertex without intravenous contrast. RADIATION DOSE REDUCTION: This exam was performed according to the departmental dose-optimization program which includes automated exposure control, adjustment of the mA and/or kV according to patient size and/or use of iterative reconstruction technique. COMPARISON:  Head CT  09/01/2022 FINDINGS: Brain: No acute territorial infarction is visualized. Thin right convexity subdural hematoma measuring up to 5 mm maximum thickness with resolution of previously noted small foci of more acute appearing blood. No new foci of acute hemorrhage. No mass effect or midline shift. Atrophy and chronic small vessel ischemic changes of the white matter. Stable ventricle size. Vascular: No hyperdense vessels.  Carotid vascular calcification Skull: Normal. Negative for fracture or focal lesion. Sinuses/Orbits: No acute finding. Other: Partially visualized fixating hardware at the dens. IMPRESSION: 1. Thin right convexity subdural hematoma/fluid collection measuring up to 5 mm maximum thickness  with interval resolution of previously noted small foci of more acute appearing blood. No new foci of hemorrhage. No mass effect or midline shift. 2. Atrophy and chronic small vessel ischemic changes of the white matter. Electronically Signed   By: Jasmine Pang M.D.   On: 09/05/2022 19:02   DG Chest Portable 1 View  Result Date: 09/05/2022 CLINICAL DATA:  Weakness, history of fall with subdural hematoma EXAM: PORTABLE CHEST 1 VIEW COMPARISON:  09/01/2022 FINDINGS: Single frontal view of the chest demonstrates an unremarkable cardiac silhouette. Hypoventilatory changes are seen at the lung bases. No acute airspace disease, effusion, or pneumothorax. No displaced fracture. IMPRESSION: 1. Bibasilar hypoventilatory changes.  No acute process. Electronically Signed   By: Sharlet Salina M.D.   On: 09/05/2022 18:34   CT Head Wo Contrast  Addendum Date: 09/01/2022   ADDENDUM REPORT: 09/01/2022 11:23 ADDENDUM: Study discussed by telephone with Dr. Cathren Laine on 09/01/2022 at 1052 hours. Electronically Signed   By: Odessa Fleming M.D.   On: 09/01/2022 11:23   Result Date: 09/01/2022 CLINICAL DATA:  89 year old male with persistent pain after a fall on cement, struck head. EXAM: CT HEAD WITHOUT CONTRAST TECHNIQUE: Contiguous axial images were obtained from the base of the skull through the vertex without intravenous contrast. RADIATION DOSE REDUCTION: This exam was performed according to the departmental dose-optimization program which includes automated exposure control, adjustment of the mA and/or kV according to patient size and/or use of iterative reconstruction technique. COMPARISON:  Head CT 12/21/2009. FINDINGS: Brain: Cerebral volume is not significantly changed since 2021. But there is a new small mixed density right side subdural hematoma most conspicuous on coronal image 38, up to 5 mm in thickness. Contralateral left subdural space seems to remain normal. No other acute intracranial hemorrhage identified. And no  significant intracranial mass effect. No midline shift, asymmetry of the lateral ventricles is mild and appears to be chronic. No ventriculomegaly. Stable gray-white matter differentiation throughout the brain. No cortically based acute infarct identified. Vascular: Calcified atherosclerosis at the skull base. No suspicious intracranial vascular hyperdensity. Skull: Stable.  No acute fracture identified. Sinuses/Orbits: Visualized paranasal sinuses and mastoids are stable and well aerated. Other: No convincing scalp soft tissue injury. Noncontrast orbits appears stable, negative. IMPRESSION: 1. Positive for a small mixed density right side Subdural Hematoma, 5 mm in thickness. 2. No significant intracranial mass effect. No skull fracture identified. No other acute intracranial abnormality. Electronically Signed: By: Odessa Fleming M.D. On: 09/01/2022 10:46   CT Thoracic Spine Wo Contrast  Result Date: 09/01/2022 CLINICAL DATA:  89 year old male with persistent pain after a fall on cement, struck head. Cervical ankylosis and acute C7 fracture. EXAM: CT THORACIC SPINE WITHOUT CONTRAST TECHNIQUE: Multidetector CT images of the thoracic were obtained using the standard protocol without intravenous contrast. RADIATION DOSE REDUCTION: This exam was performed according to the departmental dose-optimization program which includes automated exposure  control, adjustment of the mA and/or kV according to patient size and/or use of iterative reconstruction technique. COMPARISON:  CT cervical spine today. FINDINGS: Limited cervical spine imaging:  Reported separately. Thoracic spine segmentation:  Normal. Alignment:  Mildly exaggerated thoracic kyphosis. Vertebrae: Generalized osteopenia. Intermittent multilevel thoracic spine ankylosis, including C7-T1, T6 through T12. Vacuum disc at unfused T4-T5 level. T1-T2 and T2-T3 also appear unfused. No acute osseous abnormality identified in the thoracic spine. There is a chronic right  posterior 1st rib fracture. No acute posterior rib fracture identified. Visible L1 and L2 levels also appear grossly intact. Paraspinal and other soft tissues: Moderate bilateral layering pleural effusions. Calcified aortic atherosclerosis. Negative noncontrast mediastinum. Visible noncontrast upper abdominal viscera positive for extensive calcified atherosclerosis and some renal volume loss. Thoracic paraspinal soft tissues remain within normal limits. Disc levels: Widespread thoracic spine ankylosis. No CT evidence of significant thoracic spinal stenosis. IMPRESSION: 1. Subtotal thoracic spine ankylosis. No acute traumatic injury identified in the Thoracic Spine. 2. Moderate layering pleural effusions. Aortic Atherosclerosis (ICD10-I70.0). Electronically Signed   By: Odessa Fleming M.D.   On: 09/01/2022 11:18   CT Cervical Spine Wo Contrast  Result Date: 09/01/2022 CLINICAL DATA:  89 year old male with persistent pain after a fall on cement, struck head. EXAM: CT CERVICAL SPINE WITHOUT CONTRAST TECHNIQUE: Multidetector CT imaging of the cervical spine was performed without intravenous contrast. Multiplanar CT image reconstructions were also generated. RADIATION DOSE REDUCTION: This exam was performed according to the departmental dose-optimization program which includes automated exposure control, adjustment of the mA and/or kV according to patient size and/or use of iterative reconstruction technique. COMPARISON:  No prior CT available, but report from cervical spine CT 06/02/2002 which describes an acute odontoid fracture at that time. FINDINGS: Alignment: Mild straightening of cervical lordosis. Cervicothoracic junction alignment is within normal limits. Maintained bilateral posterior element alignment. Skull base and vertebrae: Some osteopenia. Visualized skull base is intact. No atlanto-occipital dissociation. 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 Hardware loosening  at the tip of the cortical screw (sagittal image 27). Odontoid fragment likely ankylosed to the anterior C1 ring. C1 and C2 alignment is maintained. C3 and C4 appear intact. There is C5-C6 interbody ankylosis from flowing endplate osteophytes. Those levels appear intact. However, ankylosis continues through the C6-C7 disc space and there is an acute oblique fracture through the C7 superior endplate, and extending through the lower right C7 body. The mildly comminuted fracture extends through the C7 body. However, the C7 pedicles and posterior elements appear intact and aligned. Superimposed C7-T1 interbody ankylosis. No additional cervical spine fracture. Soft tissues and spinal canal: No visible canal hematoma. There is mild prevertebral edema at the C7 level. Otherwise negative for age noncontrast neck soft tissues (calcified carotid atherosclerosis. Disc levels: No significant cervical spinal stenosis, with multiple levels of spinal ankylosis as above. Upper chest: Thoracic spine is detailed separately IMPRESSION: 1. Positive for Acute Fracture In The Ankylosed Spine: mildly comminuted and oblique fracture through the C7 vertebral body, with underlying C6-C7 and C7-T1 ankylosis. The C7 posterior elements appear to remain intact and aligned. This was discussed by telephone with Dr. Cathren Laine on 09/01/2022 at 10:52 . 2. Previous odontoid fracture and ORIF, but nonunion and some hardware loosening at the tip of the screw. Odontoid fragment probably ankylosed to the anterior C1 ring. 3. No other acute traumatic injury identified in the cervical spine. Thoracic spine CT reported separately. Electronically Signed   By: Odessa Fleming  M.D.   On: 09/01/2022 11:08   DG Chest 2 View  Result Date: 09/01/2022 CLINICAL DATA:  Fall last weekend.  Hurting all over. EXAM: CHEST - 2 VIEW COMPARISON:  05/27/2022 FINDINGS: Cardiac silhouette mildly enlarged. No mediastinal or hilar masses. No evidence of adenopathy. Small pleural  effusions. Dependent lung base opacity consistent with atelectasis. Remainder of the lungs is clear. No pneumothorax. Skeletal structures are demineralized, grossly intact. IMPRESSION: 1. Mild cardiomegaly, small effusions with mild dependent lung base atelectasis. No evidence of pneumonia or pulmonary edema. Electronically Signed   By: Amie Portland M.D.   On: 09/01/2022 10:33    Microbiology: Results for orders placed or performed during the hospital encounter of 05/27/22  Resp panel by RT-PCR (RSV, Flu A&B, Covid) Anterior Nasal Swab     Status: None   Collection Time: 05/27/22 10:53 AM   Specimen: Anterior Nasal Swab  Result Value Ref Range Status   SARS Coronavirus 2 by RT PCR NEGATIVE NEGATIVE Final    Comment: (NOTE) SARS-CoV-2 target nucleic acids are NOT DETECTED.  The SARS-CoV-2 RNA is generally detectable in upper respiratory specimens during the acute phase of infection. The lowest concentration of SARS-CoV-2 viral copies this assay can detect is 138 copies/mL. A negative result does not preclude SARS-Cov-2 infection and should not be used as the sole basis for treatment or other patient management decisions. A negative result may occur with  improper specimen collection/handling, submission of specimen other than nasopharyngeal swab, presence of viral mutation(s) within the areas targeted by this assay, and inadequate number of viral copies(<138 copies/mL). A negative result must be combined with clinical observations, patient history, and epidemiological information. The expected result is Negative.  Fact Sheet for Patients:  BloggerCourse.com  Fact Sheet for Healthcare Providers:  SeriousBroker.it  This test is no t yet approved or cleared by the [place] FDA and  has been authorized for detection and/or diagnosis of SARS-CoV-2 by FDA under an Emergency Use Authorization (EUA). This EUA will remain  in effect  (meaning this test can be used) for the duration of the COVID-19 declaration under Section 564(b)(1) of the Act, 21 U.S.C.section 360bbb-3(b)(1), unless the authorization is terminated  or revoked sooner.       Influenza A by PCR NEGATIVE NEGATIVE Final   Influenza B by PCR NEGATIVE NEGATIVE Final    Comment: (NOTE) The Xpert Xpress SARS-CoV-2/FLU/RSV plus assay is intended as an aid in the diagnosis of influenza from Nasopharyngeal swab specimens and should not be used as a sole basis for treatment. Nasal washings and aspirates are unacceptable for Xpert Xpress SARS-CoV-2/FLU/RSV testing.  Fact Sheet for Patients: BloggerCourse.com  Fact Sheet for Healthcare Providers: SeriousBroker.it  This test is not yet approved or cleared by the [place] FDA and has been authorized for detection and/or diagnosis of SARS-CoV-2 by FDA under an Emergency Use Authorization (EUA). This EUA will remain in effect (meaning this test can be used) for the duration of the COVID-19 declaration under Section 564(b)(1) of the Act, 21 U.S.C. section 360bbb-3(b)(1), unless the authorization is terminated or revoked.     Resp Syncytial Virus by PCR NEGATIVE NEGATIVE Final    Comment: (NOTE) Fact Sheet for Patients: BloggerCourse.com  Fact Sheet for Healthcare Providers: SeriousBroker.it  This test is not yet approved or cleared by the [place] FDA and has been authorized for detection and/or diagnosis of SARS-CoV-2 by FDA under an Emergency Use Authorization (EUA). This EUA will remain in effect (meaning this test can  be used) for the duration of the COVID-19 declaration under Section 564(b)(1) of the Act, 21 U.S.C. section 360bbb-3(b)(1), unless the authorization is terminated or revoked.  Performed at [facility], [address] [place], [place] [address]   Respiratory (~20 pathogens)  panel by PCR     Status: None   Collection Time: 05/27/22  3:39 PM   Specimen: Nasopharyngeal Swab; Respiratory  Result Value Ref Range Status   Adenovirus NOT DETECTED NOT DETECTED Final   Coronavirus 229E NOT DETECTED NOT DETECTED Final    Comment: (NOTE) The Coronavirus on the Respiratory Panel, DOES NOT test for the novel  Coronavirus (2019 nCoV)    Coronavirus HKU1 NOT DETECTED NOT DETECTED Final   Coronavirus NL63 NOT DETECTED NOT DETECTED Final   Coronavirus OC43 NOT DETECTED NOT DETECTED Final   Metapneumovirus NOT DETECTED NOT DETECTED Final   Rhinovirus / Enterovirus NOT DETECTED NOT DETECTED Final   Influenza A NOT DETECTED NOT DETECTED Final   Influenza B NOT DETECTED NOT DETECTED Final   Parainfluenza Virus 1 NOT DETECTED NOT DETECTED Final   Parainfluenza Virus 2 NOT DETECTED NOT DETECTED Final   Parainfluenza Virus 3 NOT DETECTED NOT DETECTED Final   Parainfluenza Virus 4 NOT DETECTED NOT DETECTED Final   Respiratory Syncytial Virus NOT DETECTED NOT DETECTED Final   Bordetella pertussis NOT DETECTED NOT DETECTED Final   Bordetella Parapertussis NOT DETECTED NOT DETECTED Final   Chlamydophila pneumoniae NOT DETECTED NOT DETECTED Final   Mycoplasma pneumoniae NOT DETECTED NOT DETECTED Final    Comment: Performed at [facility], [address], [place], [place] [address]    Labs: CBC: Recent Labs  Lab 09/01/22 1037 09/05/22 1900 09/06/22 0437  WBC 4.9 4.5 4.5  NEUTROABS  --  2.7 2.6  HGB 10.9* 11.1* 10.4*  HCT 31.2* 31.6* 29.8*  MCV 104.7* 105.7* 104.6*  PLT 137* 141* 149*   Basic Metabolic Panel: Recent Labs  Lab 09/01/22 1037 09/05/22 1900 09/06/22 0437  NA 134* 137 136  K 3.3* 3.4* 3.0*  CL 101 103 105  CO2 26 26 24   GLUCOSE 81 97 85  BUN 20 20 17   CREATININE 0.94 0.96 0.84  CALCIUM 9.0 9.1 8.8*  MG  --   --  1.6*   Liver Function Tests: Recent Labs  Lab 09/05/22 1900 09/06/22 0437  AST 32 28  ALT 19 17  ALKPHOS 71 63  BILITOT  0.5 0.9  PROT 6.1* 5.7*  ALBUMIN 2.4* 2.2*   CBG: No results for input(s): "GLUCAP" in the last 168 hours.  Discharge time spent: greater than 30 minutes.  Signed: Tyrone Nine, MD [facility] 09/07/2022

## 2022-09-07 NOTE — TOC Transition Note (Signed)
Transition of Care [facility]) - CM/SW Discharge Note   Patient Details  Name: Brett Blankenship MRN: [id number] Date of Birth: 1927-04-28  Transition of Care [facility]) CM/SW Contact:  Elliot Gault, LCSW Phone Number: 09/07/2022, 12:09 PM   Clinical Narrative:     Pt from home and stable for dc today per MD. [facility] PT/OT/Aide orders entered.   Met with pt at bedside to assess and review dc plan. Pt resides in his own home and pt's son has a camper right outside the home where son resides. Pt has a RW for ambulation. Per pt, his son assists him as needed.  Discussed HH recommendations and pt agreeable to referral. CMS provider options reviewed and referred as requested.  [facility] staff will call pt to arrange visits.  There are no other TOC needs for dc.  Expected Discharge Plan: Home w Home Health Services Barriers to Discharge: Barriers Resolved   Patient Goals and CMS Choice Patient states their goals for this hospitalization and ongoing recovery are:: go home CMS Medicare.gov Compare Post Acute Care list provided to:: Patient Choice offered to / list presented to : Patient  Expected Discharge Plan and Services Expected Discharge Plan: Home w Home Health Services In-house Referral: Clinical Social Work   Post Acute Care Choice: Home Health Living arrangements for the past 2 months: Single Family Home Expected Discharge Date: 09/07/22                         HH Arranged: PT, OT, Nurse's Aide HH Agency: [facility] Date [facility] Agency Contacted: 09/07/22   Representative spoke with at [facility] Agency: Kandee Keen  Prior Living Arrangements/Services Living arrangements for the past 2 months: Single Family Home Lives with:: Adult Children Patient language and need for interpreter reviewed:: Yes Do you feel safe going back to the place where you live?: Yes      Need for Family Participation in Patient Care: Yes (Comment) Care giver support system in place?: Yes (comment) Current  home services: DME    Activities of Daily Living Home Assistive Devices/Equipment: C-collar, Eyeglasses, Shower chair with back ADL Screening (condition at time of admission) Patient's cognitive ability adequate to safely complete daily activities?: Yes Is the patient deaf or have difficulty hearing?: Yes Does the patient have difficulty seeing, even when wearing glasses/contacts?: No Does the patient have difficulty concentrating, remembering, or making decisions?: Yes Patient able to express need for assistance with ADLs?: Yes Does the patient have difficulty dressing or bathing?: No Independently performs ADLs?: Yes (appropriate for developmental age) Does the patient have difficulty walking or climbing stairs?: Yes Weakness of Legs: Both Weakness of Arms/Hands: None  Permission Sought/Granted Permission sought to share information with : Facility Engineer, maintenance (IT) granted to share info w AGENCY: HH        Emotional Assessment Appearance:: Appears stated age Attitude/Demeanor/Rapport: Engaged Affect (typically observed): Pleasant Orientation: : Oriented to Self, Oriented to Place, Oriented to  Time, Oriented to Situation Alcohol / Substance Use: Not Applicable Psych Involvement: No (comment)  Admission diagnosis:  Lower urinary tract infectious disease [N39.0] Encephalopathy [G93.40] Acute metabolic encephalopathy [G93.41] Patient Active Problem List   Diagnosis Date Noted   Pancreatic mass 09/06/2022   UTI (urinary tract infection) 09/06/2022   Hypokalemia 09/06/2022   Acute metabolic encephalopathy 09/05/2022   Aortic regurgitation 06/17/2022   Dilatation of aorta (HCC) 06/17/2022   Acute CHF (congestive heart failure) (HCC)  05/28/2022   Elevated troponin 05/27/2022   Macrocytic anemia 05/27/2022   Stage 3b chronic kidney disease (CKD) (HCC) 05/27/2022   Acute exacerbation of CHF (congestive heart failure) (HCC) 05/27/2022   A-fib (HCC)  05/27/2022   Mixed hyperlipidemia 09/04/2021   Vitamin D deficiency 09/04/2021   Systolic murmur 12/01/2016   Bradycardia 12/01/2016   Chronic pain of left knee 10/10/2016   Essential hypertension 07/12/2009   PCP:  Mechele Claude, MD Pharmacy:   [facility] - [place], [place] - [phone] [place] [address] Phone: [phone] Fax: [phone]     Social Determinants of Health (SDOH) Interventions    Readmission Risk Interventions     No data to display           Final next level of care: Home w Home Health Services Barriers to Discharge: Barriers Resolved   Patient Goals and CMS Choice CMS Medicare.gov Compare Post Acute Care list provided to:: Patient Choice offered to / list presented to : Patient  Discharge Placement                         Discharge Plan and Services Additional resources added to the After Visit Summary for   In-house Referral: Clinical Social Work   Post Acute Care Choice: Home Health                    HH Arranged: PT, OT, Nurse's Aide HH Agency: [facility] Date [facility] Agency Contacted: 09/07/22   Representative spoke with at [facility] Agency: Kandee Keen  Social Determinants of Health (SDOH) Interventions SDOH Screenings   Food Insecurity: No Food Insecurity (09/06/2022)  Housing: Low Risk  (09/06/2022)  Transportation Needs: No Transportation Needs (09/06/2022)  Utilities: Not At Risk (09/06/2022)  Alcohol Screen: Low Risk  (03/01/2022)  Depression (PHQ2-9): High Risk (05/23/2022)  Financial Resource Strain: Low Risk  (03/01/2022)  Physical Activity: Sufficiently Active (03/01/2022)  Social Connections: Moderately Isolated (03/01/2022)  Stress: No Stress Concern Present (03/01/2022)  Tobacco Use: Medium Risk (09/05/2022)     Readmission Risk Interventions     No data to display

## 2022-09-08 LAB — URINE CULTURE: Culture: >=100000 — AB

## 2022-09-09 DIAGNOSIS — Z6827 Body mass index (BMI) 27.0-27.9, adult: Secondary | ICD-10-CM | POA: Diagnosis not present

## 2022-09-09 DIAGNOSIS — S12591A Other nondisplaced fracture of sixth cervical vertebra, initial encounter for closed fracture: Secondary | ICD-10-CM | POA: Diagnosis not present

## 2022-09-10 ENCOUNTER — Encounter: Payer: Self-pay | Admitting: Internal Medicine

## 2022-09-10 ENCOUNTER — Ambulatory Visit: Payer: Medicare Other | Attending: Internal Medicine | Admitting: Internal Medicine

## 2022-09-10 VITALS — BP 158/74 | HR 85 | Ht 69.0 in | Wt 189.2 lb

## 2022-09-10 DIAGNOSIS — I509 Heart failure, unspecified: Secondary | ICD-10-CM | POA: Diagnosis not present

## 2022-09-10 DIAGNOSIS — I5033 Acute on chronic diastolic (congestive) heart failure: Secondary | ICD-10-CM | POA: Diagnosis not present

## 2022-09-10 MED ORDER — FUROSEMIDE 40 MG PO TABS: ORAL_TABLET | ORAL | 3 refills | Status: DC

## 2022-09-10 MED ORDER — POTASSIUM CHLORIDE CRYS ER 20 MEQ PO TBCR: 20.0000 meq | EXTENDED_RELEASE_TABLET | Freq: Two times a day (BID) | ORAL | 3 refills | Status: DC

## 2022-09-10 NOTE — Patient Instructions (Signed)
Medication Instructions:  Your physician has recommended you make the following change in your medication:   -Increase Lasix to 40 mg twice daily for 7 days, then decrease to 40 mg tablet daily.   -Start Potassium Chloride 20 mEq twice daily  *If you need a refill on your cardiac medications before your next appointment, please call your pharmacy*   Lab Work: In 5 days: -BMET  If you have labs (blood work) drawn today and your tests are completely normal, you will receive your results only by: MyChart Message (if you have MyChart) OR A paper copy in the mail If you have any lab test that is abnormal or we need to change your treatment, we will call you to review the results.   Testing/Procedures: None   Follow-Up: At [facility], you and your health needs are our priority.  As part of our continuing mission to provide you with exceptional heart care, we have created designated Provider Care Teams.  These Care Teams include your primary Cardiologist (physician) and Advanced Practice Providers (APPs -  Physician Assistants and Nurse Practitioners) who all work together to provide you with the care you need, when you need it.  We recommend signing up for the patient portal called "MyChart".  Sign up information is provided on this After Visit Summary.  MyChart is used to connect with patients for Virtual Visits (Telemedicine).  Patients are able to view lab/test results, encounter notes, upcoming appointments, etc.  Non-urgent messages can be sent to your provider as well.   To learn more about what you can do with MyChart, go to [email or web address].    Your next appointment:   3 month(s)  Provider:   Luane School, MD    Other Instructions

## 2022-09-11 ENCOUNTER — Telehealth: Payer: Self-pay | Admitting: Family Medicine

## 2022-09-11 DIAGNOSIS — I700 Atherosclerosis of aorta: Secondary | ICD-10-CM | POA: Diagnosis not present

## 2022-09-11 DIAGNOSIS — N390 Urinary tract infection, site not specified: Secondary | ICD-10-CM | POA: Diagnosis not present

## 2022-09-11 DIAGNOSIS — S065XAD Traumatic subdural hemorrhage with loss of consciousness status unknown, subsequent encounter: Secondary | ICD-10-CM | POA: Diagnosis not present

## 2022-09-11 DIAGNOSIS — I10 Essential (primary) hypertension: Secondary | ICD-10-CM | POA: Diagnosis not present

## 2022-09-11 DIAGNOSIS — D696 Thrombocytopenia, unspecified: Secondary | ICD-10-CM | POA: Diagnosis not present

## 2022-09-11 DIAGNOSIS — K8689 Other specified diseases of pancreas: Secondary | ICD-10-CM | POA: Diagnosis not present

## 2022-09-11 DIAGNOSIS — J9811 Atelectasis: Secondary | ICD-10-CM | POA: Diagnosis not present

## 2022-09-11 DIAGNOSIS — I480 Paroxysmal atrial fibrillation: Secondary | ICD-10-CM | POA: Diagnosis not present

## 2022-09-11 DIAGNOSIS — I723 Aneurysm of iliac artery: Secondary | ICD-10-CM | POA: Diagnosis not present

## 2022-09-11 DIAGNOSIS — E876 Hypokalemia: Secondary | ICD-10-CM | POA: Diagnosis not present

## 2022-09-11 DIAGNOSIS — S12600D Unspecified displaced fracture of seventh cervical vertebra, subsequent encounter for fracture with routine healing: Secondary | ICD-10-CM | POA: Diagnosis not present

## 2022-09-11 DIAGNOSIS — M4324 Fusion of spine, thoracic region: Secondary | ICD-10-CM | POA: Diagnosis not present

## 2022-09-11 DIAGNOSIS — G9341 Metabolic encephalopathy: Secondary | ICD-10-CM | POA: Diagnosis not present

## 2022-09-11 DIAGNOSIS — J90 Pleural effusion, not elsewhere classified: Secondary | ICD-10-CM | POA: Diagnosis not present

## 2022-09-11 DIAGNOSIS — E782 Mixed hyperlipidemia: Secondary | ICD-10-CM | POA: Diagnosis not present

## 2022-09-11 NOTE — Telephone Encounter (Signed)
Contacted Brett Blankenship to schedule their annual wellness visit. Appointment made for 03/03/2023.  Thank you,  Judeth Cornfield,  AMB Clinical Support [facility] AWV Program Direct Dial ??[phone]

## 2022-09-12 DIAGNOSIS — I700 Atherosclerosis of aorta: Secondary | ICD-10-CM | POA: Diagnosis not present

## 2022-09-12 DIAGNOSIS — E782 Mixed hyperlipidemia: Secondary | ICD-10-CM | POA: Diagnosis not present

## 2022-09-12 DIAGNOSIS — M4324 Fusion of spine, thoracic region: Secondary | ICD-10-CM | POA: Diagnosis not present

## 2022-09-12 DIAGNOSIS — I723 Aneurysm of iliac artery: Secondary | ICD-10-CM | POA: Diagnosis not present

## 2022-09-12 DIAGNOSIS — K8689 Other specified diseases of pancreas: Secondary | ICD-10-CM | POA: Diagnosis not present

## 2022-09-12 DIAGNOSIS — G9341 Metabolic encephalopathy: Secondary | ICD-10-CM | POA: Diagnosis not present

## 2022-09-12 DIAGNOSIS — S065XAD Traumatic subdural hemorrhage with loss of consciousness status unknown, subsequent encounter: Secondary | ICD-10-CM | POA: Diagnosis not present

## 2022-09-12 DIAGNOSIS — I10 Essential (primary) hypertension: Secondary | ICD-10-CM | POA: Diagnosis not present

## 2022-09-12 DIAGNOSIS — I480 Paroxysmal atrial fibrillation: Secondary | ICD-10-CM | POA: Diagnosis not present

## 2022-09-12 DIAGNOSIS — D696 Thrombocytopenia, unspecified: Secondary | ICD-10-CM | POA: Diagnosis not present

## 2022-09-12 DIAGNOSIS — J9811 Atelectasis: Secondary | ICD-10-CM | POA: Diagnosis not present

## 2022-09-12 DIAGNOSIS — J90 Pleural effusion, not elsewhere classified: Secondary | ICD-10-CM | POA: Diagnosis not present

## 2022-09-12 DIAGNOSIS — E876 Hypokalemia: Secondary | ICD-10-CM | POA: Diagnosis not present

## 2022-09-12 DIAGNOSIS — N390 Urinary tract infection, site not specified: Secondary | ICD-10-CM | POA: Diagnosis not present

## 2022-09-12 DIAGNOSIS — S12600D Unspecified displaced fracture of seventh cervical vertebra, subsequent encounter for fracture with routine healing: Secondary | ICD-10-CM | POA: Diagnosis not present

## 2022-09-13 ENCOUNTER — Telehealth: Payer: Self-pay | Admitting: Internal Medicine

## 2022-09-13 MED ORDER — FUROSEMIDE 40 MG PO TABS: ORAL_TABLET | ORAL | 3 refills | Status: DC

## 2022-09-13 NOTE — Telephone Encounter (Signed)
Attempted to contact son- no answer, mailbox full

## 2022-09-13 NOTE — Telephone Encounter (Signed)
Pt c/o Shortness Of Breath: STAT if SOB developed within the last 24 hours or pt is noticeably SOB on the phone  1. Are you currently SOB (can you hear that pt is SOB on the phone)?    2. How long have you been experiencing SOB?  Tuesday morning had SOB while laying flat + SOB this morning   3. Are you SOB when sitting or when up moving around?  Both   4. Are you currently experiencing any other symptoms?  Patient's son assumes he has fluid built up

## 2022-09-13 NOTE — Telephone Encounter (Signed)
Spoke to pt's son who stated that around 4:30 am pt woke up unable to breathe. Pt sat up in bed which seemed to help and then after a while, laid back down which caused him to be unable to breathe yet again. Pt's son stated he thinks pt has fluid built up- states pt has increased po lasix as per providers orders, but it does not seem to help.   Pt weights: 5/15- 185.9  5/16- 186.0 5/17-186.2  Please advise.

## 2022-09-13 NOTE — Telephone Encounter (Signed)
Pt's son notified and verbalized understanding. Patient's son had no further questions or concerns at this time.

## 2022-09-15 DIAGNOSIS — I5033 Acute on chronic diastolic (congestive) heart failure: Secondary | ICD-10-CM | POA: Insufficient documentation

## 2022-09-15 DIAGNOSIS — I5032 Chronic diastolic (congestive) heart failure: Secondary | ICD-10-CM | POA: Insufficient documentation

## 2022-09-15 NOTE — Progress Notes (Addendum)
Cardiology Office Note  Date: 09/15/2022   ID: Brett Blankenship, DOB 12/25/1926, MRN [id number]  PCP:  Mechele Claude, MD  Cardiologist:  Marjo Bicker, MD Electrophysiologist:  None   Reason for Office Visit: Abnormal echocardiogram, posthospitalization follow-up   History of Present Illness: Brett Blankenship is a 89 y.o. male with atrial fibrillation, valvular heart disease (possible severe AI and mild AS on 05/28/2022) presented to cardiology clinic for follow-up visit.  Patient was recently admitted to [facility] in 04/2022 with a chief complaint of generalized weakness associated with decreased p.o. intake x 2 months prior to presentation. Denied any SOB but had chronic LE swelling for 2 years. He was admitted for CHF exacerbation on Lasix. Echocardiogram showed normal LVEF, possible severe AI and mild AS with severe aortic dilatation (43 mm aortic root and 49 mm ascending aorta). Eliquis held due to fall in 2021 but since there were no falls in 2023, Eliquis was resumed in 05/2022 He eventually had fall in 08/2022 resulting in ICH/SDH requiring to discontinue Eliquis. He is here for follow-up visit, accompanied by son. He denied DOE but reported DOE on 09/10/22 AM and has leg swelling. No angina, palpitations, syncope.   Past Medical History:  Diagnosis Date   HYPERTENSION 07/12/2009    Past Surgical History:  Procedure Laterality Date   APPENDECTOMY  1960   NECK SURGERY      Current Outpatient Medications  Medication Sig Dispense Refill   acetaminophen (TYLENOL) 650 MG CR tablet Take 650 mg by mouth every 8 (eight) hours as needed for pain.     fenofibrate 160 MG tablet TAKE 1 TABLET BY MOUTH ONCE A DAY. (Patient taking differently: Take 160 mg by mouth daily.) 90 tablet 1   fish oil-omega-3 fatty acids 1000 MG capsule Take 2 g by mouth daily.     Multiple Vitamins-Minerals (EYE VITAMINS & MINERALS PO) Take by mouth. [employer]     nabumetone (RELAFEN) 500  MG tablet TAKE 2 TABLETS BY MOUTH TWICE DAILY AS NEEDED FOR PAIN. (Patient taking differently: Take 1,000 mg by mouth 2 (two) times daily as needed for moderate pain.) 360 tablet 0   OVER THE COUNTER MEDICATION Take 1 tablet by mouth daily. Vitamin B12     prednisoLONE acetate (PRED FORTE) 1 % ophthalmic suspension Place 1 drop into the left eye every other day.     Vitamin D, Ergocalciferol, (DRISDOL) 1.25 MG (50000 UNIT) CAPS capsule TAKE 1 CAPSULE BY MOUTH ONCE EVERY 7 DAYS. (Patient taking differently: Take 50,000 Units by mouth every 7 (seven) days.) 13 capsule 1   furosemide (LASIX) 40 MG tablet Take 2 tablets (80 mg total) by mouth 2 (two) times daily for 7 days, THEN 1 tablet (40 mg total) 2 (two) times daily. 208 tablet 3   potassium chloride SA (KLOR-CON M) 20 MEQ tablet Take 1 tablet (20 mEq total) by mouth 2 (two) times daily. 180 tablet 3   No current facility-administered medications for this visit.   Allergies:  Patient has no known allergies.   Social History: The patient  reports that he quit smoking about 59 years ago. His smoking use included cigarettes. He has a 1.50 pack-year smoking history. He has never used smokeless tobacco. He reports that he does not currently use alcohol. He reports that he does not currently use drugs.   Family History: The patient's family history includes Cancer in his brother; Heart disease in his father; Hypertension in his father.  ROS:  Please see the history of present illness. Otherwise, complete review of systems is positive for none.  All other systems are reviewed and negative.   Physical Exam: VS:  BP (!) 158/74   Pulse 85   Ht 5\' 9"  (1.753 m)   Wt 189 lb 3.2 oz (85.8 kg)   SpO2 97%   BMI 27.94 kg/m , BMI Body mass index is 27.94 kg/m.  Wt Readings from Last 3 Encounters:  09/10/22 189 lb 3.2 oz (85.8 kg)  09/05/22 175 lb (79.4 kg)  06/17/22 173 lb (78.5 kg)    General: Patient appears comfortable at rest. HEENT: Conjunctiva  and lids normal, oropharynx clear with moist mucosa. Neck: Supple,  carotid bruits, no thyromegaly. Lungs: Clear to auscultation, nonlabored breathing at rest. Cardiac: Iregular rate and rhythm, systolic murmur +, diastolic murmur +, faint Abdomen: Soft, nontender, no hepatomegaly, bowel sounds present, no guarding or rebound. Extremities: 2-3+ pitting edema Skin: Warm and dry. Musculoskeletal: No kyphosis. Neuropsychiatric: Alert and oriented x3, affect grossly appropriate.  ECG: Atrial fibrillation, rate controlled  Recent Labwork: 05/27/2022: TSH 1.180 09/05/2022: B Natriuretic Peptide 682.0 09/06/2022: ALT 17; AST 28; BUN 17; Creatinine, Ser 0.84; Hemoglobin 10.4; Magnesium 1.6; Platelets 149; Potassium 3.0; Sodium 136     Component Value Date/Time   CHOL 98 (L) 04/18/2022 1018   TRIG 121 04/18/2022 1018   HDL 26 (L) 04/18/2022 1018   CHOLHDL 3.8 04/18/2022 1018   LDLCALC 50 04/18/2022 1018    Other Studies Reviewed Today: Echocardiogram in 05/28/2022 1. The aortic valve was not well visualized. There is an eccentric aortic  valve regurgitation jet impinging along the ventricular surface of  anterior mitral valve leaflet resulting in thickening/restricted mobility  of anterior mitral valve leaflet with  mild mitral valve regurgitation consistent with chronic, probably severe  aortic valve regurgitation. Mild aortic valve stenosis. Consider TEE for  further evaluation of aortic valve.  2. Aortic dilatation noted. There is mild dilatation of the aortic root,  measuring 43 mm. There is moderate dilatation of the ascending aorta,  measuring 49 mm.  3. Left ventricular ejection fraction, by estimation, is 60 to 65%. The  left ventricle has normal function. Left ventricular endocardial border  not optimally defined to evaluate regional wall motion. There is moderate  asymmetric left ventricular  hypertrophy of the septal and basal segments. Left ventricular diastolic  function  could not be evaluated.   4. Right ventricular systolic function was not well visualized. The right  ventricular size is normal.   5. Left atrial size was severely dilated.   6. The mitral valve is abnormal. Mild mitral valve regurgitation. No  evidence of mitral stenosis. Moderate mitral annular calcification.   Comparison(s): Prior images unable to be directly viewed, comparison made  by report only. Changes from prior study are noted. Normal LVEF and  eccentric aortic valve regurgitation likely severe and chronic in duration  (worsened from moderate aortic  regurgitation from 2018).   Assessment and Plan: Patient is a 89 year old M known to have persistent atrial fibrillation (off AC due to SDH), valvular heart disease (possible severe AI and mild AS) present to cardiology clinic for follow-up visit.  # Severe eccentric aortic valve regurgitation likely secondary to aortic root/ascending aortic dilatation (aortic root dilatation 43 mm and ascending aorta dilatation 49 mm) # HFpEF, acute -Patient is symptomatic with DOE and b/l LE swelling. Increased p.o laix from 20 mg to 40 mg once daily. Addendum: Patient continued to have DOE,  orthopnea and PND after which lasix dose was increased to 40 mg BID x 7 days followed by 40 mg once daily thereafter. Obtain BMP in 5 days. -Patient refused any invasive procedures including TEE. Not a candidate for any surgical intervention and he refused any surgeries as well. -Discontinue amlodipine 5 mg once daily -Patient also has mild mitral regurgitation and mild aortic valve stenosis which will be monitored with serial echocardiogram.  # Persistent atrial fibrillation (new onset in 04/2022), rate controlled (EKG on 09/05/22 showed atrial fibrillation, rate controlled) -Rate controlling agents held due to possible severe AI -Eliquis discontinued due to ICH from fall in 08/2022 -Not a candidate for Watchman due to patient refusal and severe AI  I have  spent a total of 33 minutes with patient reviewing chart, EKGs, labs and examining patient as well as establishing an assessment and plan that was discussed with the patient.  > 50% of time was spent in direct patient care.     Medication Adjustments/Labs and Tests Ordered: Current medicines are reviewed at length with the patient today.  Concerns regarding medicines are outlined above.   Tests Ordered: Orders Placed This Encounter  Procedures   Basic metabolic panel    Medication Changes: Meds ordered this encounter  Medications   potassium chloride SA (KLOR-CON M) 20 MEQ tablet    Sig: Take 1 tablet (20 mEq total) by mouth 2 (two) times daily.    Dispense:  180 tablet    Refill:  3   DISCONTD: furosemide (LASIX) 40 MG tablet    Sig: Take 1 tablet (40 mg total) by mouth 2 (two) times daily for 7 days, THEN 1 tablet (40 mg total) daily.    Dispense:  90 tablet    Refill:  3    Disposition:  Follow up  1-2 weeks with APP or me, 3 months with me  Signed, Hisham Provence Verne Spurr, MD, 09/15/2022 9:39 PM    [facility] at [facility] [address] [place], [place] [address]

## 2022-09-16 DIAGNOSIS — I509 Heart failure, unspecified: Secondary | ICD-10-CM | POA: Diagnosis not present

## 2022-09-17 LAB — BASIC METABOLIC PANEL
BUN/Creatinine Ratio: 20 (ref 10–24)
BUN: 18 mg/dL (ref 10–36)
CO2: 28 mmol/L (ref 20–29)
Calcium: 9.7 mg/dL (ref 8.6–10.2)
Chloride: 97 mmol/L (ref 96–106)
Creatinine, Ser: 0.92 mg/dL (ref 0.76–1.27)
Glucose: 130 mg/dL — ABNORMAL HIGH (ref 70–99)
Potassium: 3.7 mmol/L (ref 3.5–5.2)
Sodium: 141 mmol/L (ref 134–144)
eGFR: 77 mL/min/{1.73_m2} (ref >59)

## 2022-09-18 ENCOUNTER — Telehealth: Payer: Self-pay | Admitting: Internal Medicine

## 2022-09-18 NOTE — Telephone Encounter (Signed)
Pt c/o Shortness Of Breath: STAT if SOB developed within the last 24 hours or pt is noticeably SOB on the phone  1. Are you currently SOB (can you hear that pt is SOB on the phone)?  Patient's son states the patient has not been SOB since this morning  2. How long have you been experiencing SOB?  Refer to previous encounter  3. Are you SOB when sitting or when up moving around?  When laying down - patient's son states the patient woke up around 4:15 AM with SOB, he couldn't cathing his breath so the son went home to monitor him. he sat up and within 15-20 minutes he felt better. Son assumes it is due to fluid build up around patient's lungs.  4. Are you currently experiencing any other symptoms?  swelling

## 2022-09-18 NOTE — Telephone Encounter (Signed)
Pt's son states that pt woke up at 4:15 am this morning SOB stating that he could not breathe. Pt weight on yesterday was 186 lbs and today it was 187.2 lbs. Son denies increased swelling. Will send to DOD. Please advise.

## 2022-09-19 DIAGNOSIS — I723 Aneurysm of iliac artery: Secondary | ICD-10-CM | POA: Diagnosis not present

## 2022-09-19 DIAGNOSIS — K8689 Other specified diseases of pancreas: Secondary | ICD-10-CM | POA: Diagnosis not present

## 2022-09-19 DIAGNOSIS — J90 Pleural effusion, not elsewhere classified: Secondary | ICD-10-CM | POA: Diagnosis not present

## 2022-09-19 DIAGNOSIS — J9811 Atelectasis: Secondary | ICD-10-CM | POA: Diagnosis not present

## 2022-09-19 DIAGNOSIS — N390 Urinary tract infection, site not specified: Secondary | ICD-10-CM | POA: Diagnosis not present

## 2022-09-19 DIAGNOSIS — S065XAD Traumatic subdural hemorrhage with loss of consciousness status unknown, subsequent encounter: Secondary | ICD-10-CM | POA: Diagnosis not present

## 2022-09-19 DIAGNOSIS — I10 Essential (primary) hypertension: Secondary | ICD-10-CM | POA: Diagnosis not present

## 2022-09-19 DIAGNOSIS — G9341 Metabolic encephalopathy: Secondary | ICD-10-CM | POA: Diagnosis not present

## 2022-09-19 DIAGNOSIS — M4324 Fusion of spine, thoracic region: Secondary | ICD-10-CM | POA: Diagnosis not present

## 2022-09-19 DIAGNOSIS — E876 Hypokalemia: Secondary | ICD-10-CM | POA: Diagnosis not present

## 2022-09-19 DIAGNOSIS — S12600D Unspecified displaced fracture of seventh cervical vertebra, subsequent encounter for fracture with routine healing: Secondary | ICD-10-CM | POA: Diagnosis not present

## 2022-09-19 DIAGNOSIS — E782 Mixed hyperlipidemia: Secondary | ICD-10-CM | POA: Diagnosis not present

## 2022-09-19 DIAGNOSIS — I700 Atherosclerosis of aorta: Secondary | ICD-10-CM | POA: Diagnosis not present

## 2022-09-19 DIAGNOSIS — D696 Thrombocytopenia, unspecified: Secondary | ICD-10-CM | POA: Diagnosis not present

## 2022-09-19 DIAGNOSIS — I480 Paroxysmal atrial fibrillation: Secondary | ICD-10-CM | POA: Diagnosis not present

## 2022-09-20 NOTE — Telephone Encounter (Signed)
I spoke with son, Marcial Pacas. He states father feels better today. He states father is hesitant to go to the ED saying if it was his time to go, he wants to be at home. Son says he is going to speak with his pcp about palliative/hospice care. I encouraged them to go to the ED if SOB returns,son says he will discuss this again with father.

## 2022-09-24 ENCOUNTER — Other Ambulatory Visit: Payer: Self-pay

## 2022-09-24 ENCOUNTER — Emergency Department (HOSPITAL_COMMUNITY)
Admission: EM | Admit: 2022-09-24 | Discharge: 2022-09-24 | Disposition: A | Discharge status: Home / Self Care | Payer: Medicare Other | Attending: Emergency Medicine | Admitting: Emergency Medicine

## 2022-09-24 ENCOUNTER — Encounter (HOSPITAL_COMMUNITY): Payer: Self-pay | Admitting: Emergency Medicine

## 2022-09-24 ENCOUNTER — Emergency Department (HOSPITAL_COMMUNITY): Payer: Medicare Other

## 2022-09-24 DIAGNOSIS — I130 Hypertensive heart and chronic kidney disease with heart failure and stage 1 through stage 4 chronic kidney disease, or unspecified chronic kidney disease: Secondary | ICD-10-CM | POA: Diagnosis not present

## 2022-09-24 DIAGNOSIS — R0602 Shortness of breath: Secondary | ICD-10-CM | POA: Diagnosis not present

## 2022-09-24 DIAGNOSIS — I509 Heart failure, unspecified: Secondary | ICD-10-CM | POA: Diagnosis not present

## 2022-09-24 DIAGNOSIS — R009 Unspecified abnormalities of heart beat: Secondary | ICD-10-CM | POA: Insufficient documentation

## 2022-09-24 DIAGNOSIS — Z7901 Long term (current) use of anticoagulants: Secondary | ICD-10-CM | POA: Insufficient documentation

## 2022-09-24 DIAGNOSIS — R0789 Other chest pain: Secondary | ICD-10-CM | POA: Diagnosis not present

## 2022-09-24 DIAGNOSIS — N189 Chronic kidney disease, unspecified: Secondary | ICD-10-CM | POA: Insufficient documentation

## 2022-09-24 DIAGNOSIS — R079 Chest pain, unspecified: Secondary | ICD-10-CM | POA: Diagnosis not present

## 2022-09-24 DIAGNOSIS — J90 Pleural effusion, not elsewhere classified: Secondary | ICD-10-CM | POA: Diagnosis not present

## 2022-09-24 HISTORY — DX: Endocarditis, valve unspecified: I38

## 2022-09-24 HISTORY — DX: Unspecified displaced fracture of seventh cervical vertebra, initial encounter for closed fracture: S12.600A

## 2022-09-24 HISTORY — DX: Heart failure, unspecified: I50.9

## 2022-09-24 HISTORY — DX: Unspecified atrial fibrillation: I48.91

## 2022-09-24 LAB — HEPATIC FUNCTION PANEL
ALT: 15 U/L (ref 0–44)
AST: 31 U/L (ref 15–41)
Albumin: 2.8 g/dL — ABNORMAL LOW (ref 3.5–5.0)
Alkaline Phosphatase: 63 U/L (ref 38–126)
Bilirubin, Direct: 0.1 mg/dL (ref 0.0–0.2)
Indirect Bilirubin: 1.2 mg/dL — ABNORMAL HIGH (ref 0.3–0.9)
Total Bilirubin: 1.3 mg/dL — ABNORMAL HIGH (ref 0.3–1.2)
Total Protein: 6.9 g/dL (ref 6.5–8.1)

## 2022-09-24 LAB — BASIC METABOLIC PANEL
Anion gap: 15 (ref 5–15)
BUN: 25 mg/dL — ABNORMAL HIGH (ref 8–23)
CO2: 22 mmol/L (ref 22–32)
Calcium: 9.6 mg/dL (ref 8.9–10.3)
Chloride: 100 mmol/L (ref 98–111)
Creatinine, Ser: 1.08 mg/dL (ref 0.61–1.24)
GFR, Estimated: >60 mL/min (ref >60)
Glucose, Bld: 86 mg/dL (ref 70–99)
Potassium: 3.2 mmol/L — ABNORMAL LOW (ref 3.5–5.1)
Sodium: 137 mmol/L (ref 135–145)

## 2022-09-24 LAB — CBC
HCT: 34.4 % — ABNORMAL LOW (ref 39.0–52.0)
Hemoglobin: 11.8 g/dL — ABNORMAL LOW (ref 13.0–17.0)
MCH: 36 pg — ABNORMAL HIGH (ref 26.0–34.0)
MCHC: 34.3 g/dL (ref 30.0–36.0)
MCV: 104.9 fL — ABNORMAL HIGH (ref 80.0–100.0)
Platelets: 124 10*3/uL — ABNORMAL LOW (ref 150–400)
RBC: 3.28 MIL/uL — ABNORMAL LOW (ref 4.22–5.81)
RDW: 13.7 % (ref 11.5–15.5)
WBC: 3.8 10*3/uL — ABNORMAL LOW (ref 4.0–10.5)
nRBC: 0 % (ref 0.0–0.2)

## 2022-09-24 LAB — LIPASE, BLOOD: Lipase: 35 U/L (ref 11–51)

## 2022-09-24 LAB — TROPONIN I (HIGH SENSITIVITY)
Troponin I (High Sensitivity): 69 ng/L — ABNORMAL HIGH (ref <18)
Troponin I (High Sensitivity): 72 ng/L — ABNORMAL HIGH (ref <18)

## 2022-09-24 LAB — BRAIN NATRIURETIC PEPTIDE: B Natriuretic Peptide: 999 pg/mL — ABNORMAL HIGH (ref 0.0–100.0)

## 2022-09-24 MED ORDER — IOHEXOL 350 MG/ML SOLN
75.0000 mL | Freq: Once | INTRAVENOUS | Status: AC | PRN
  Administered 2022-09-24: 75 mL via INTRAVENOUS

## 2022-09-24 MED ORDER — POTASSIUM CHLORIDE 20 MEQ PO PACK
40.0000 meq | PACK | Freq: Once | ORAL | Status: AC
  Administered 2022-09-24: 40 meq via ORAL
  Filled 2022-09-24: qty 2

## 2022-09-24 MED ORDER — FUROSEMIDE 10 MG/ML IJ SOLN
40.0000 mg | Freq: Once | INTRAMUSCULAR | Status: AC
  Administered 2022-09-24: 40 mg via INTRAVENOUS
  Filled 2022-09-24: qty 4

## 2022-09-24 MED ORDER — ASPIRIN 81 MG PO CHEW
324.0000 mg | CHEWABLE_TABLET | Freq: Once | ORAL | Status: AC
  Administered 2022-09-24: 324 mg via ORAL
  Filled 2022-09-24: qty 4

## 2022-09-24 NOTE — Discharge Instructions (Signed)
The testing done in the emergency department is overall reassuring.  It does appear that you have some fluid on your lungs.  This is treated with the Lasix that you are currently taking.  You did receive a dose of IV Lasix here in the emergency department.  Talk to your outpatient doctors about ongoing treatment with the this and other medications.  It sounds like they may be considering trialing a new diuretic medication.  It may help to prop your torso up some more while you sleep.  A referral was sent to the cardiology office.  You should hear from them in the next couple days.  If you do not, call the number below.  Return the emergency department for any new or worsening symptoms of concern.

## 2022-09-24 NOTE — ED Triage Notes (Signed)
Pt arrived PV, a/o with son. Pt c/o mid sternal cp with sob intermittent since last night. Ble swelling noted. Non diaphoretic. Color wnl. No resp distress or sob noted at this time

## 2022-09-24 NOTE — ED Provider Notes (Signed)
EMERGENCY DEPARTMENT AT [facility] Provider Note   CSN: [id number] Arrival date & time: 09/24/22  0827     History  Chief Complaint  Patient presents with   Chest Pain   Shortness of Breath    Brett Blankenship is a 89 y.o. male.   Chest Pain Associated symptoms: shortness of breath   Shortness of Breath Associated symptoms: chest pain   Patient presents for chest pain and shortness of breath.  Medical history includes atrial fibrillation, aortic dilatation, CHF, HTN, CKD, and recent C7 fracture and SDH.  He had a hospital admission 3 weeks ago for encephalopathy secondary to UTI.  He was treated with ceftriaxone and Keflex.  Given his fall risk, he has been taken off of Eliquis permanently by his cardiologist.  Last night, at around midnight, patient had onset of chest pain or shortness of breath.  He describes his chest pain as diffuse and anterior.  Symptoms did persist throughout the morning.  Currently, symptoms have resolved.  Patient's son reports that he will occasionally have similar episodes when laying down.  They are typically relieved by sitting or standing up.  Patient does continue to walk with a walker.  When sleeping, patient is laying flat.  His son did increase the elevation on the head of the bed by 1.5 inches.  He was recently placed on 40 mg of Lasix twice daily.  Dose has now been reduced to 40 mg daily.  He does not take a daily aspirin.     Home Medications Prior to Admission medications   Medication Sig Start Date End Date Taking? Authorizing Provider  acetaminophen (TYLENOL) 650 MG CR tablet Take 650 mg by mouth every 8 (eight) hours as needed for pain.   Yes [provider]  fish oil-omega-3 fatty acids 1000 MG capsule Take 2 g by mouth daily.   Yes [provider]  Multiple Vitamins-Minerals (EYE VITAMINS & MINERALS PO) Take by mouth. [employer]   Yes [provider]  nabumetone (RELAFEN) 500 MG tablet TAKE  2 TABLETS BY MOUTH TWICE DAILY AS NEEDED FOR PAIN. Patient taking differently: Take 1,000 mg by mouth 2 (two) times daily as needed for moderate pain. 08/02/22  Yes Mechele Claude, MD  OVER THE COUNTER MEDICATION Take 1 tablet by mouth daily. Vitamin B12   Yes [provider]  potassium chloride SA (KLOR-CON M) 20 MEQ tablet Take 1 tablet (20 mEq total) by mouth 2 (two) times daily. 09/10/22  Yes Mallipeddi, Vishnu P, MD  prednisoLONE acetate (PRED FORTE) 1 % ophthalmic suspension Place 1 drop into the left eye every other day. 08/12/22  Yes [provider]  Vitamin D, Ergocalciferol, (DRISDOL) 1.25 MG (50000 UNIT) CAPS capsule TAKE 1 CAPSULE BY MOUTH ONCE EVERY 7 DAYS. Patient taking differently: Take 50,000 Units by mouth every 7 (seven) days. Takes on Sunday 06/11/22  Yes Stacks, Broadus [name], MD  furosemide (LASIX) 40 MG tablet Take 2 tablets (80 mg total) by mouth 2 (two) times daily for 7 days, THEN 1 tablet (40 mg total) 2 (two) times daily. Patient taking differently: Take 40mg  once a day 09/13/22 09/20/23  Mallipeddi, Orion Modest, MD      Allergies    Patient has no known allergies.    Review of Systems   Review of Systems  Respiratory:  Positive for shortness of breath.   Cardiovascular:  Positive for chest pain.  All other systems reviewed and are negative.   Physical Exam Updated  Vital Signs BP (!) 148/82   Pulse 77   Temp 97.7 F (36.5 C) (Oral)   Resp 20   SpO2 94%  Physical Exam Vitals and nursing note reviewed.  Constitutional:      General: He is not in acute distress.    Appearance: He is well-developed. He is not ill-appearing, toxic-appearing or diaphoretic.  HENT:     Head: Normocephalic and atraumatic.  Eyes:     Conjunctiva/sclera: Conjunctivae normal.  Cardiovascular:     Rate and Rhythm: Normal rate. Rhythm irregular.     Heart sounds: No murmur heard. Pulmonary:     Effort: Pulmonary effort is normal. No tachypnea or respiratory distress.      Breath sounds: Normal breath sounds. No decreased breath sounds, wheezing or rhonchi.  Chest:     Chest wall: No tenderness.  Abdominal:     Palpations: Abdomen is soft.     Tenderness: There is no abdominal tenderness.  Musculoskeletal:        General: No swelling. Normal range of motion.     Cervical back: Normal range of motion and neck supple.     Right lower leg: No edema.     Left lower leg: No edema.  Skin:    General: Skin is warm and dry.     Coloration: Skin is not cyanotic or pale.  Neurological:     General: No focal deficit present.     Mental Status: He is alert and oriented to person, place, and time.  Psychiatric:        Mood and Affect: Mood normal.        Behavior: Behavior normal.     ED Results / Procedures / Treatments   Labs (all labs ordered are listed, but only abnormal results are displayed) Labs Reviewed  BASIC METABOLIC PANEL - Abnormal; Notable for the following components:      Result Value   Potassium 3.2 (*)    BUN 25 (*)    All other components within normal limits  CBC - Abnormal; Notable for the following components:   WBC 3.8 (*)    RBC 3.28 (*)    Hemoglobin 11.8 (*)    HCT 34.4 (*)    MCV 104.9 (*)    MCH 36.0 (*)    Platelets 124 (*)    All other components within normal limits  BRAIN NATRIURETIC PEPTIDE - Abnormal; Notable for the following components:   B Natriuretic Peptide 999.0 (*)    All other components within normal limits  HEPATIC FUNCTION PANEL - Abnormal; Notable for the following components:   Albumin 2.8 (*)    Total Bilirubin 1.3 (*)    Indirect Bilirubin 1.2 (*)    All other components within normal limits  TROPONIN I (HIGH SENSITIVITY) - Abnormal; Notable for the following components:   Troponin I (High Sensitivity) 69 (*)    All other components within normal limits  TROPONIN I (HIGH SENSITIVITY) - Abnormal; Notable for the following components:   Troponin I (High Sensitivity) 72 (*)    All other components  within normal limits  LIPASE, BLOOD    EKG EKG Interpretation  Date/Time:  Tuesday Sep 24 2022 08:57:27 EDT Ventricular Rate:  83 PR Interval:    QRS Duration: 122 QT Interval:  417 QTC Calculation: 490 R Axis:   -52 Text Interpretation: Atrial fibrillation Nonspecific IVCD with LAD LVH with secondary repolarization abnormality Anterior Q waves, possibly due to LVH Confirmed by Durwin Nora, Marqueze Ramcharan ([id number]) on  09/24/2022 12:27:44 PM  Radiology CT Angio Chest PE W and/or Wo Contrast  Result Date: 09/24/2022 CLINICAL DATA:  Pulmonary embolism (PE) suspected, high prob. Chest pain and shortness of breath. Bilateral lower extremity swelling. EXAM: CT ANGIOGRAPHY CHEST WITH CONTRAST TECHNIQUE: Multidetector CT imaging of the chest was performed using the standard protocol during bolus administration of intravenous contrast. Multiplanar CT image reconstructions and MIPs were obtained to evaluate the vascular anatomy. RADIATION DOSE REDUCTION: This exam was performed according to the departmental dose-optimization program which includes automated exposure control, adjustment of the mA and/or kV according to patient size and/or use of iterative reconstruction technique. CONTRAST:  75mL OMNIPAQUE IOHEXOL 350 MG/ML SOLN COMPARISON:  Chest radiographs 09/24/2022. CT abdomen and pelvis 09/05/2022. FINDINGS: Cardiovascular: Pulmonary arterial opacification is adequate without evidence of emboli. There is thoracic aortic atherosclerosis, and there is fusiform dilatation of the ascending aorta with maximal diameter of 4.5 cm. The heart is enlarged with three-vessel coronary atherosclerosis and a trace pericardial effusion. Mediastinum/Nodes: 1 cm peripherally calcified right thyroid nodule for which no imaging follow-up is recommended. No enlarged axillary, mediastinal, or hilar lymph nodes. Unremarkable esophagus. Lungs/Pleura: Moderate bilateral pleural effusions, larger than on this month's earlier abdominal CT.  Associated compressive atelectasis in the lower lobes. Near complete collapse of the right middle lobe centrally. Dependent atelectasis or scarring in the lingula. Mild bronchial wall thickening, mild interlobular septal thickening, and mild ground-glass opacities bilaterally. No pneumothorax. Upper Abdomen: No acute abnormality. Musculoskeletal: Widespread ankylosis in the included portions of the cervical and thoracic spine. No suspicious osseous lesion. Review of the MIP images confirms the above findings. IMPRESSION: 1. No evidence of pulmonary emboli. 2. Moderate bilateral pleural effusions with compressive atelectasis in the lower lobes. 3. Near complete collapse of the right middle lobe. 4. Possible mild pulmonary edema. 5. 4.5 cm ascending aortic aneurysm. Recommend semi-annual imaging followup by CTA or MRA and referral to cardiothoracic surgery if not already obtained. This recommendation follows 2010 ACCF/AHA/AATS/ACR/ASA/SCA/SCAI/SIR/STS/SVM Guidelines for the Diagnosis and Management of Patients With Thoracic Aortic Disease. Circulation. 2010; 121: Z610-R604. Aortic aneurysm NOS (ICD10-I71.9) 6.  Aortic Atherosclerosis (ICD10-I70.0). Electronically Signed   By: Sebastian Ache M.D.   On: 09/24/2022 13:34   DG Chest 2 View  Result Date: 09/24/2022 CLINICAL DATA:  Chest pain, shortness of breath. EXAM: CHEST - 2 VIEW COMPARISON:  Sep 05, 2022. FINDINGS: Stable cardiomediastinal silhouette. Small left pleural effusion is noted. Mild right basilar scarring is noted. Bony thorax is unremarkable. IMPRESSION: Small left pleural effusion.  Mild right basilar scarring. Aortic Atherosclerosis (ICD10-I70.0). Electronically Signed   By: Lupita Raider M.D.   On: 09/24/2022 09:03    Procedures Procedures    Medications Ordered in ED Medications  aspirin chewable tablet 324 mg (324 mg Oral Given 09/24/22 0909)  potassium chloride (KLOR-CON) packet 40 mEq (40 mEq Oral Given 09/24/22 1056)  iohexol (OMNIPAQUE)  350 MG/ML injection 75 mL (75 mLs Intravenous Contrast Given 09/24/22 1313)  furosemide (LASIX) injection 40 mg (40 mg Intravenous Given 09/24/22 1411)    ED Course/ Medical Decision Making/ A&P                             Medical Decision Making Amount and/or Complexity of Data Reviewed Labs: ordered. Radiology: ordered.  Risk OTC drugs. Prescription drug management.   This patient presents to the ED for concern of chest pain and shortness of breath, this involves an extensive number of  treatment options, and is a complaint that carries with it a high risk of complications and morbidity.  The differential diagnosis includes ACS, CHF exacerbation, pulm edema, reactive airway disease, pneumonia, PE   Co morbidities that complicate the patient evaluation  atrial fibrillation, aortic dilatation, CHF, HTN, CKD, and recent C7 fracture and SDH   Additional history obtained:  Additional history obtained from patient's son External records from outside source obtained and reviewed including EMR   Lab Tests:  I Ordered, and personally interpreted labs.  The pertinent results include: Mild hypokalemia, baseline stable elevation in troponin, BNP slightly increased from recent lab work, baseline anemia, mild leukopenia   Imaging Studies ordered:  I ordered imaging studies including chest x-ray, CTA chest I independently visualized and interpreted imaging which showed mild pulm edema, right middle lobe collapse, bilateral pleural effusions I agree with the radiologist interpretation   Cardiac Monitoring: / EKG:  The patient was maintained on a cardiac monitor.  I personally viewed and interpreted the cardiac monitored which showed an underlying rhythm of: Atrial fibrillation   Problem List / ED Course / Critical interventions / Medication management  Patient presents for episode of chest pain and shortness of breath.  This occurred last night around midnight and it persisted most  of the morning.  Currently, symptoms have resolved.  EKG does not show any concerning ST segment elevations.  Patient was previously on Eliquis for his atrial fibrillation but has been taken off of this due to fall risk.  He did have a fall a month ago resulting in SDH and C7 fracture.  He remains in a cervical collar.  Vital signs on arrival are reassuring.  Patient is well-appearing on exam.  He does have chronic memory loss and history is provided mostly by his son.  324 ASA was ordered.  Lab work was initiated.  Lab findings are notable for findings detailed above.  On reassessment, patient remained asymptomatic while in the ED, sitting upright.  He underwent CTA of chest which showed some mild pulmonary edema and bilateral pleural effusions.  He was given dose of IV Lasix in the ED.  He continued to remain asymptomatic.  Patient is stable for discharge at this time with cardiology follow-up.. I ordered medication including potassium chloride for hypokalemia; Lasix for diuresis Reevaluation of the patient after these medicines showed that the patient resolved I have reviewed the patients home medicines and have made adjustments as needed   Social Determinants of Health:  Has access to outpatient care, lives at home with son         Final Clinical Impression(s) / ED Diagnoses Final diagnoses:  Shortness of breath  Chest pain, unspecified type    Rx / DC Orders ED Discharge Orders          Ordered    Ambulatory referral to Cardiology       Comments: If you have not heard from the Cardiology office within the next 72 hours please call [phone].   09/24/22 1442              Gloris Manchester, MD 09/24/22 1459

## 2022-09-25 DIAGNOSIS — S065XAD Traumatic subdural hemorrhage with loss of consciousness status unknown, subsequent encounter: Secondary | ICD-10-CM | POA: Diagnosis not present

## 2022-09-25 DIAGNOSIS — E876 Hypokalemia: Secondary | ICD-10-CM | POA: Diagnosis not present

## 2022-09-25 DIAGNOSIS — E782 Mixed hyperlipidemia: Secondary | ICD-10-CM | POA: Diagnosis not present

## 2022-09-25 DIAGNOSIS — I723 Aneurysm of iliac artery: Secondary | ICD-10-CM | POA: Diagnosis not present

## 2022-09-25 DIAGNOSIS — I700 Atherosclerosis of aorta: Secondary | ICD-10-CM | POA: Diagnosis not present

## 2022-09-25 DIAGNOSIS — D696 Thrombocytopenia, unspecified: Secondary | ICD-10-CM | POA: Diagnosis not present

## 2022-09-25 DIAGNOSIS — I10 Essential (primary) hypertension: Secondary | ICD-10-CM | POA: Diagnosis not present

## 2022-09-25 DIAGNOSIS — I480 Paroxysmal atrial fibrillation: Secondary | ICD-10-CM | POA: Diagnosis not present

## 2022-09-25 DIAGNOSIS — S12600D Unspecified displaced fracture of seventh cervical vertebra, subsequent encounter for fracture with routine healing: Secondary | ICD-10-CM | POA: Diagnosis not present

## 2022-09-25 DIAGNOSIS — M4324 Fusion of spine, thoracic region: Secondary | ICD-10-CM | POA: Diagnosis not present

## 2022-09-25 DIAGNOSIS — N390 Urinary tract infection, site not specified: Secondary | ICD-10-CM | POA: Diagnosis not present

## 2022-09-25 DIAGNOSIS — K8689 Other specified diseases of pancreas: Secondary | ICD-10-CM | POA: Diagnosis not present

## 2022-09-25 DIAGNOSIS — J90 Pleural effusion, not elsewhere classified: Secondary | ICD-10-CM | POA: Diagnosis not present

## 2022-09-25 DIAGNOSIS — G9341 Metabolic encephalopathy: Secondary | ICD-10-CM | POA: Diagnosis not present

## 2022-09-25 DIAGNOSIS — J9811 Atelectasis: Secondary | ICD-10-CM | POA: Diagnosis not present

## 2022-09-30 ENCOUNTER — Telehealth: Payer: Self-pay | Admitting: Internal Medicine

## 2022-09-30 ENCOUNTER — Ambulatory Visit: Payer: Medicare Other | Attending: Internal Medicine | Admitting: Internal Medicine

## 2022-09-30 ENCOUNTER — Encounter: Payer: Self-pay | Admitting: Internal Medicine

## 2022-09-30 ENCOUNTER — Telehealth: Payer: Self-pay

## 2022-09-30 VITALS — BP 160/72 | HR 97 | Ht 69.0 in | Wt 185.6 lb

## 2022-09-30 DIAGNOSIS — Z79899 Other long term (current) drug therapy: Secondary | ICD-10-CM | POA: Diagnosis not present

## 2022-09-30 DIAGNOSIS — J90 Pleural effusion, not elsewhere classified: Secondary | ICD-10-CM | POA: Diagnosis not present

## 2022-09-30 MED ORDER — NITROGLYCERIN 0.4 MG SL SUBL: 0.4000 mg | SUBLINGUAL_TABLET | SUBLINGUAL | 3 refills | Status: DC | PRN

## 2022-09-30 MED ORDER — TORSEMIDE 20 MG PO TABS: 40.0000 mg | ORAL_TABLET | Freq: Two times a day (BID) | ORAL | 1 refills | Status: DC

## 2022-09-30 NOTE — Telephone Encounter (Signed)
-----   Message from Marjo Bicker, MD sent at 09/29/2022  7:31 PM EDT ----- Normal serum creatinine. Continue current medications.

## 2022-09-30 NOTE — Telephone Encounter (Signed)
Checking percert on the following patient for testing scheduled at [facility].   US guided thoracentesis at [facility] dx: bilateral pleural effusions 10/01/2022 @9 :00 am at [facility]. Arrive @8 :30 am

## 2022-09-30 NOTE — Progress Notes (Signed)
Cardiology Office Note  Date: 09/30/2022   ID: Brett Blankenship, DOB 02-Jul-1926, MRN [id number]  PCP:  Mechele Claude, MD  Cardiologist:  Marjo Bicker, MD Electrophysiologist:  None   Reason for Office Visit: Post ER visit follow-up   History of Present Illness: Brett Blankenship is a 89 y.o. male with atrial fibrillation, valvular heart disease (possible severe AI and mild AS on 05/28/2022), ascending aorta dilatation 4.5 cm presented to cardiology clinic for follow-up visit.   Patient had multiple ER and hospital admissions since 04/2022 for ADHF and falls. Eliquis was discontinued in 08/2022 due to fall resulting in ICH/SDH.  He underwent a CT angio PE in 09/24/2022 that showed moderate bilateral pleural effusions. He is currently on p.o. Lasix 40 mg once daily with no improvement in his symptoms of DOE and bilateral lower EXTR swelling. He also had chest pain few times in 08/2022.  No syncope, palpitations.  Past Medical History:  Diagnosis Date   Atrial fibrillation (HCC)    C7 cervical fracture (HCC)    CHF (congestive heart failure) (HCC)    HYPERTENSION 07/12/2009   Leaky heart valve     Past Surgical History:  Procedure Laterality Date   APPENDECTOMY  1960   NECK SURGERY      Current Outpatient Medications  Medication Sig Dispense Refill   acetaminophen (TYLENOL) 650 MG CR tablet Take 650 mg by mouth every 8 (eight) hours as needed for pain.     fish oil-omega-3 fatty acids 1000 MG capsule Take 2 g by mouth daily.     Multiple Vitamins-Minerals (EYE VITAMINS & MINERALS PO) Take by mouth. [employer]     nabumetone (RELAFEN) 500 MG tablet TAKE 2 TABLETS BY MOUTH TWICE DAILY AS NEEDED FOR PAIN. (Patient taking differently: Take 1,000 mg by mouth 2 (two) times daily as needed for moderate pain.) 360 tablet 0   nitroGLYCERIN (NITROSTAT) 0.4 MG SL tablet Place 1 tablet (0.4 mg total) under the tongue every 5 (five) minutes x 3 doses as needed for chest pain (if no  relief after 3rd dose, proceed to ED or call 911). 25 tablet 3   OVER THE COUNTER MEDICATION Take 1 tablet by mouth daily. Vitamin B12     potassium chloride SA (KLOR-CON M) 20 MEQ tablet Take 1 tablet (20 mEq total) by mouth 2 (two) times daily. 180 tablet 3   prednisoLONE acetate (PRED FORTE) 1 % ophthalmic suspension Place 1 drop into the left eye every other day.     torsemide (DEMADEX) 20 MG tablet Take 2 tablets (40 mg total) by mouth 2 (two) times daily. 120 tablet 1   Vitamin D, Ergocalciferol, (DRISDOL) 1.25 MG (50000 UNIT) CAPS capsule TAKE 1 CAPSULE BY MOUTH ONCE EVERY 7 DAYS. (Patient taking differently: Take 50,000 Units by mouth every 7 (seven) days. Takes on Sunday) 13 capsule 1   No current facility-administered medications for this visit.   Allergies:  Patient has no known allergies.   Social History: The patient  reports that he quit smoking about 59 years ago. His smoking use included cigarettes. He has a 1.50 pack-year smoking history. He has never used smokeless tobacco. He reports that he does not currently use alcohol. He reports that he does not currently use drugs.   Family History: The patient's family history includes Cancer in his brother; Heart disease in his father; Hypertension in his father.   ROS:  Please see the history of present illness. Otherwise, complete review of  systems is positive for none  All other systems are reviewed and negative.   Physical Exam: VS:  BP (!) 160/72   Pulse 97   Ht 5\' 9"  (1.753 m)   Wt 185 lb 9.6 oz (84.2 kg)   SpO2 98%   BMI 27.41 kg/m , BMI Body mass index is 27.41 kg/m.  Wt Readings from Last 3 Encounters:  09/30/22 185 lb 9.6 oz (84.2 kg)  09/10/22 189 lb 3.2 oz (85.8 kg)  09/05/22 175 lb (79.4 kg)    General: Patient appears comfortable at rest. HEENT: Conjunctiva and lids normal, oropharynx clear with moist mucosa. Neck: Supple, no elevated JVP or carotid bruits, no thyromegaly. Lungs: Clear to auscultation,  nonlabored breathing at rest. Cardiac: Regular rate and rhythm, no S3 or significant systolic murmur, no pericardial rub. Abdomen: Soft, nontender, no hepatomegaly, bowel sounds present, no guarding or rebound. Extremities: 2-3+ pitting edema, distal pulses 2+. Skin: Warm and dry. Musculoskeletal: No kyphosis. Neuropsychiatric: Alert and oriented x3, affect grossly appropriate.  Recent Labwork: 05/27/2022: TSH 1.180 09/06/2022: Magnesium 1.6 09/24/2022: ALT 15; AST 31; B Natriuretic Peptide 999.0; BUN 25; Creatinine, Ser 1.08; Hemoglobin 11.8; Platelets 124; Potassium 3.2; Sodium 137     Component Value Date/Time   CHOL 98 (L) 04/18/2022 1018   TRIG 121 04/18/2022 1018   HDL 26 (L) 04/18/2022 1018   CHOLHDL 3.8 04/18/2022 1018   LDLCALC 50 04/18/2022 1018    Assessment and Plan:  Patient is a 89 year old M known to have valvular heart disease (possible severe aortic valve regurgitation and mild aortic stenosis on 05/28/2022), persistent A-fib, ascending aorta dilatation 4.5 cm in 5/24 is here for follow-up visit from ER.  # Acute on chronic diastolic heart failure exacerbation # Moderate bilateral pleural effusions -Patient has symptoms of DOE, orthopnea, PND and bilateral lower EXTR swelling not resolved with p.o. Lasix 40 mg once daily. In addition, bilateral lower EXTR swelling could likely be secondary to severe hypoalbuminemia and also from ADHF. Will switch Lasix to torsemide 40 mg twice daily. Obtain BMP in 5 days. Will continue current potassium supplements.  SL NTG 0.4 mg as needed for chest pain. -He will benefit from ultrasound-guided thoracentesis due to the presence of moderate bilateral pleural effusions  # Possible severe eccentric aortic valve regurgitation # Mild aortic valve stenosis -Patient not a candidate for surgical intervention due to advanced age. He refused any invasive procedures including TEE. -Switch Lasix to torsemide 40 mg twice daily. Amlodipine was  discontinued in the past due to bilateral lower EXTR swelling.  # Persistent A-fib -Rate controlling agents held due to possible severe AI -Eliquis discontinued due to ICH from fall in 08/2022 -Not a candidate for watchman due to patient refusal and severe AI  I have spent a total of 30 minutes with patient reviewing chart, EKGs, labs and examining patient as well as establishing an assessment and plan that was discussed with the patient.  > 50% of time was spent in direct patient care.    Medication Adjustments/Labs and Tests Ordered: Current medicines are reviewed at length with the patient today.  Concerns regarding medicines are outlined above.   Tests Ordered: Orders Placed This Encounter  Procedures   US THORACENTESIS ASP PLEURAL SPACE W/IMG GUIDE   Basic metabolic panel    Medication Changes: Meds ordered this encounter  Medications   torsemide (DEMADEX) 20 MG tablet    Sig: Take 2 tablets (40 mg total) by mouth 2 (two) times daily.  Dispense:  120 tablet    Refill:  1    09/30/2022 NEW-stop furosemide   nitroGLYCERIN (NITROSTAT) 0.4 MG SL tablet    Sig: Place 1 tablet (0.4 mg total) under the tongue every 5 (five) minutes x 3 doses as needed for chest pain (if no relief after 3rd dose, proceed to ED or call 911).    Dispense:  25 tablet    Refill:  3    09/30/2022 NEW    Disposition:  Follow up  10 days  Signed Cherolyn Behrle Verne Spurr, MD, 09/30/2022 12:00 PM    [facility] at [facility] [address] [place], [place], [place] [address]

## 2022-09-30 NOTE — Telephone Encounter (Signed)
Patient seen in office for follow up visit. Patient notified of results and verbalized understanding. Patient had no questions or concerns at this time

## 2022-09-30 NOTE — Patient Instructions (Addendum)
Medication Instructions:  Your physician has recommended you make the following change in your medication:  Stop furosemide Start torsemide 40 mg daily Nitroglycerin 0.4 mg. Dissolve one under tongue for chest pain every 5 minutes up to 3 doses. If no relief, proceed to ED. Continue other medications the same  Labwork: BMET -10/07/2022-can be done at your family doctors Non-fasting  Testing/Procedures: Thoracentesis @[facility] 10/01/2022 @9 :00 am. Arrive at 8:30 am main entrance  Follow-Up: Your physician recommends that you schedule a follow-up appointment in: 10 days  Any Other Special Instructions Will Be Listed Below (If Applicable).  If you need a refill on your cardiac medications before your next appointment, please call your pharmacy.

## 2022-10-01 ENCOUNTER — Ambulatory Visit (HOSPITAL_COMMUNITY)
Admission: RE | Admit: 2022-10-01 | Discharge: 2022-10-01 | Disposition: A | Discharge status: Home / Self Care | Payer: Medicare Other | Source: Ambulatory Visit | Attending: Internal Medicine | Admitting: Internal Medicine

## 2022-10-01 ENCOUNTER — Ambulatory Visit (HOSPITAL_COMMUNITY): Admission: RE | Admit: 2022-10-01 | Payer: Medicare Other | Source: Ambulatory Visit

## 2022-10-01 DIAGNOSIS — J90 Pleural effusion, not elsewhere classified: Secondary | ICD-10-CM | POA: Insufficient documentation

## 2022-10-01 DIAGNOSIS — R918 Other nonspecific abnormal finding of lung field: Secondary | ICD-10-CM | POA: Diagnosis not present

## 2022-10-01 MED ORDER — LIDOCAINE HCL (PF) 2 % IJ SOLN
INTRAMUSCULAR | Status: AC
  Filled 2022-10-01: qty 20

## 2022-10-01 MED ORDER — LIDOCAINE HCL (PF) 2 % IJ SOLN
10.0000 mL | Freq: Once | INTRAMUSCULAR | Status: AC
  Administered 2022-10-01: 10 mL via INTRADERMAL

## 2022-10-01 NOTE — Progress Notes (Signed)
Pt transported to [place] 3 per WC, no obvious distress. Assisted to stretcher in sitting position. Procedure explained, consent obtained, son present for explanation. All in agreement. Draped and prepped in sterile manner. Local anesthetic admin without adverse event. Access obtained through post R thorax without difficulty. Tolerated well. Clear amber serous fluid aspirated, connected to vacutainer suction. 800 cc accumulated. No obvious distress. Access withdrawn, hemostasis achieved, bandaid placed. DC'd to carte of Brett Blankenship, son.

## 2022-10-01 NOTE — Procedures (Signed)
INDICATION: Bilateral pleural effusions EXAM: ULTRASOUND GUIDED RIGHT THORACENTESIS GRIP-IR: Category: Fluids  Subcategory: Thoracentesis  Follow-Up: None  MEDICATIONS: None. COMPLICATIONS: None immediate. PROCEDURE: An ultrasound guided thoracentesis was thoroughly discussed with the patient and questions answered.  At the patient's request, I also repeated the consent process with his son.  The benefits, risks, alternatives and complications (including bleeding, infection, and pneumothorax) were also discussed.  The patient understands and wishes to proceed with the procedure.  Written consent was obtained.  Standard time out performed.   Ultrasound was performed to localize and mark an adequate pocket of fluid in the right chest.  The area was then prepped and draped in the normal sterile fashion. 1% Lidocaine was used for local anesthesia.  Safe-T-Centesis catheter felt reluctant to advance through the soft tissues and accordingly a Yueh catheter was utilized.  Thoracentesis was performed. The catheter was removed and a dressing applied.  No pneumothorax identified on the postprocedural radiograph.   FINDINGS: A total of approximately 800 cc of straw-colored fluid was removed. IMPRESSION: Successful ultrasound guided right thoracentesis yielding 800 cc of pleural fluid.

## 2022-10-02 ENCOUNTER — Ambulatory Visit (INDEPENDENT_AMBULATORY_CARE_PROVIDER_SITE_OTHER): Payer: Medicare Other | Admitting: Family Medicine

## 2022-10-02 ENCOUNTER — Encounter: Payer: Self-pay | Admitting: Family Medicine

## 2022-10-02 VITALS — BP 138/66 | HR 93 | Temp 97.2°F | Ht 69.0 in | Wt 178.8 lb

## 2022-10-02 DIAGNOSIS — I351 Nonrheumatic aortic (valve) insufficiency: Secondary | ICD-10-CM

## 2022-10-02 DIAGNOSIS — S12600A Unspecified displaced fracture of seventh cervical vertebra, initial encounter for closed fracture: Secondary | ICD-10-CM

## 2022-10-02 DIAGNOSIS — J90 Pleural effusion, not elsewhere classified: Secondary | ICD-10-CM | POA: Diagnosis not present

## 2022-10-02 DIAGNOSIS — D539 Nutritional anemia, unspecified: Secondary | ICD-10-CM | POA: Diagnosis not present

## 2022-10-02 DIAGNOSIS — I480 Paroxysmal atrial fibrillation: Secondary | ICD-10-CM

## 2022-10-02 DIAGNOSIS — I509 Heart failure, unspecified: Secondary | ICD-10-CM | POA: Diagnosis not present

## 2022-10-02 DIAGNOSIS — E559 Vitamin D deficiency, unspecified: Secondary | ICD-10-CM

## 2022-10-02 DIAGNOSIS — N1832 Chronic kidney disease, stage 3b: Secondary | ICD-10-CM | POA: Diagnosis not present

## 2022-10-02 LAB — CMP14+EGFR
BUN/Creatinine Ratio: 16 (ref 10–24)
BUN: 19 mg/dL (ref 10–36)
Chloride: 103 mmol/L (ref 96–106)
Glucose: 80 mg/dL (ref 70–99)

## 2022-10-02 LAB — CBC WITH DIFFERENTIAL/PLATELET
Basos: 1 %
EOS (ABSOLUTE): 0 10*3/uL (ref 0.0–0.4)
Immature Granulocytes: 0 %
Lymphs: 31 %
MCV: 107 fL — ABNORMAL HIGH (ref 79–97)

## 2022-10-02 NOTE — Progress Notes (Signed)
Subjective:  Patient ID: Brett Blankenship, male    DOB: Oct 15, 1926  Age: 89 y.o. MRN: [id number]  CC: Follow-up   HPI Brett Blankenship presents for subdural hematoma hospitalization. His eliquis was Dced by cardiology due to fall risk. He fell and was admitted on 5/10 due to C7 fracture. He continues to wear full cervical brace. States that will be in place 3 more weeks, but he doesn't have to wear it in the shower or to sleep.   Atrial fibrillation follow up. Pt. is treated with rate control. NOAC Dced.  Pt.  denies palpitations, rapid rate, chest pain, dyspnea and edema.   HE was also found to have aortic regurgitation which in turn caused CHF and bilateral pleural effusion. He underwent thoracentesis yesterday. He felt so much better, he says he slept like a baby last night.      10/02/2022   10:01 AM 10/02/2022    9:56 AM 05/23/2022    2:32 PM  Depression screen PHQ 2/9  Decreased Interest 0 0 3  Down, Depressed, Hopeless 0 0 2  PHQ - 2 Score 0 0 5  Altered sleeping   1  Tired, decreased energy   3  Change in appetite   2  Feeling bad or failure about yourself    0  Trouble concentrating   0  Moving slowly or fidgety/restless   1  Suicidal thoughts   0  PHQ-9 Score   12  Difficult doing work/chores   Somewhat difficult    History [name] has a past medical history of Atrial fibrillation (HCC), C7 cervical fracture (HCC), CHF (congestive heart failure) (HCC), HYPERTENSION (07/12/2009), and Leaky heart valve.   He has a past surgical history that includes Appendectomy (1960) and Neck surgery.   His family history includes Cancer in his brother; Heart disease in his father; Hypertension in his father.He reports that he quit smoking about 59 years ago. His smoking use included cigarettes. He has a 1.50 pack-year smoking history. He has never used smokeless tobacco. He reports that he does not currently use alcohol. He reports that he does not currently use  drugs.    ROS Review of Systems  Constitutional:  Positive for fatigue. Negative for fever.  Respiratory: Negative.  Negative for shortness of breath.   Cardiovascular:  Negative for chest pain.  Musculoskeletal:  Positive for arthralgias, back pain and neck pain (C& fracture).  Skin:  Negative for rash.    Objective:  BP 138/66   Pulse 93   Temp (!) 97.2 F (36.2 C)   Ht 5\' 9"  (1.753 m)   Wt 178 lb 12.8 oz (81.1 kg)   SpO2 97%   BMI 26.40 kg/m   BP Readings from Last 3 Encounters:  10/02/22 138/66  10/01/22 (!) 160/79  09/30/22 (!) 160/72    Wt Readings from Last 3 Encounters:  10/02/22 178 lb 12.8 oz (81.1 kg)  09/30/22 185 lb 9.6 oz (84.2 kg)  09/10/22 189 lb 3.2 oz (85.8 kg)     Physical Exam Constitutional:      General: He is not in acute distress.    Appearance: He is well-developed.  HENT:     Head: Normocephalic and atraumatic.     Right Ear: External ear normal.     Left Ear: External ear normal.     Nose: Nose normal.  Eyes:     Conjunctiva/sclera: Conjunctivae normal.     Pupils: Pupils are equal, round, and reactive to light.  Cardiovascular:     Rate and Rhythm: Normal rate and regular rhythm.     Heart sounds: Normal heart sounds. No murmur heard. Pulmonary:     Effort: Pulmonary effort is normal. No respiratory distress.     Breath sounds: Normal breath sounds. No wheezing or rales.  Abdominal:     Palpations: Abdomen is soft.     Tenderness: There is no abdominal tenderness.  Musculoskeletal:        General: Normal range of motion.     Cervical back: Normal range of motion and neck supple.  Skin:    General: Skin is warm and dry.  Neurological:     Mental Status: He is alert and oriented to person, place, and time.     Deep Tendon Reflexes: Reflexes are normal and symmetric.  Psychiatric:        Behavior: Behavior normal.        Thought Content: Thought content normal.        Judgment: Judgment normal.       Assessment &  Plan:   Brett Blankenship was seen today for follow-up.  Diagnoses and all orders for this visit:  Paroxysmal atrial fibrillation (HCC) -     BMP8+EGFR -     CBC with Differential/Platelet -     CMP14+EGFR  Nonrheumatic aortic valve insufficiency -     BMP8+EGFR -     CBC with Differential/Platelet -     CMP14+EGFR  Vitamin D deficiency -     VITAMIN D 25 Hydroxy (Vit-D Deficiency, Fractures)  Pleural effusion, bilateral  Closed displaced fracture of seventh cervical vertebra, unspecified fracture morphology, initial encounter (HCC)       I am having Brett Blankenship. Blankenship maintain his fish oil-omega-3 fatty acids, Multiple Vitamins-Minerals (EYE VITAMINS & MINERALS PO), acetaminophen, Vitamin D (Ergocalciferol), nabumetone, prednisoLONE acetate, OVER THE COUNTER MEDICATION, potassium chloride SA, torsemide, and nitroGLYCERIN.  Allergies as of 10/02/2022   No Known Allergies      Medication List        Accurate as of October 02, 2022  9:55 PM. If you have any questions, ask your nurse or doctor.          acetaminophen 650 MG CR tablet Commonly known as: TYLENOL Take 650 mg by mouth every 8 (eight) hours as needed for pain.   EYE VITAMINS & MINERALS PO Take by mouth. [employer]   fish oil-omega-3 fatty acids 1000 MG capsule Take 2 g by mouth daily.   nabumetone 500 MG tablet Commonly known as: RELAFEN TAKE 2 TABLETS BY MOUTH TWICE DAILY AS NEEDED FOR PAIN. What changed: See the new instructions.   nitroGLYCERIN 0.4 MG SL tablet Commonly known as: NITROSTAT Place 1 tablet (0.4 mg total) under the tongue every 5 (five) minutes x 3 doses as needed for chest pain (if no relief after 3rd dose, proceed to ED or call 911).   OVER THE COUNTER MEDICATION Take 1 tablet by mouth daily. Vitamin B12   potassium chloride SA 20 MEQ tablet Commonly known as: KLOR-CON M Take 1 tablet (20 mEq total) by mouth 2 (two) times daily.   prednisoLONE acetate 1 % ophthalmic  suspension Commonly known as: PRED FORTE Place 1 drop into the left eye every other day.   torsemide 20 MG tablet Commonly known as: DEMADEX Take 2 tablets (40 mg total) by mouth 2 (two) times daily.   Vitamin D (Ergocalciferol) 1.25 MG (50000 UNIT) Caps capsule Commonly known as: DRISDOL TAKE 1 CAPSULE BY  MOUTH ONCE EVERY 7 DAYS. What changed: See the new instructions.         Follow-up: No follow-ups on file.  Mechele Claude, M.D.

## 2022-10-03 ENCOUNTER — Other Ambulatory Visit: Payer: Self-pay | Admitting: Family Medicine

## 2022-10-03 DIAGNOSIS — D7589 Other specified diseases of blood and blood-forming organs: Secondary | ICD-10-CM

## 2022-10-03 LAB — CBC WITH DIFFERENTIAL/PLATELET
Basophils Absolute: 0 10*3/uL (ref 0.0–0.2)
Eos: 1 %
Hematocrit: 34.8 % — ABNORMAL LOW (ref 37.5–51.0)
Hemoglobin: 11.6 g/dL — ABNORMAL LOW (ref 13.0–17.7)
Immature Grans (Abs): 0 10*3/uL (ref 0.0–0.1)
Lymphocytes Absolute: 1.3 10*3/uL (ref 0.7–3.1)
MCH: 35.7 pg — ABNORMAL HIGH (ref 26.6–33.0)
MCHC: 33.3 g/dL (ref 31.5–35.7)
Monocytes Absolute: 0.4 10*3/uL (ref 0.1–0.9)
Monocytes: 9 %
Neutrophils Absolute: 2.5 10*3/uL (ref 1.4–7.0)
Neutrophils: 58 %
Platelets: 115 10*3/uL — ABNORMAL LOW (ref 150–450)
RBC: 3.25 x10E6/uL — ABNORMAL LOW (ref 4.14–5.80)
RDW: 13.2 % (ref 11.6–15.4)
WBC: 4.3 10*3/uL (ref 3.4–10.8)

## 2022-10-03 LAB — CMP14+EGFR
ALT: 12 IU/L (ref 0–44)
AST: 27 IU/L (ref 0–40)
Albumin/Globulin Ratio: 1 — ABNORMAL LOW (ref 1.2–2.2)
Albumin: 3.4 g/dL — ABNORMAL LOW (ref 3.6–4.6)
Alkaline Phosphatase: 78 IU/L (ref 44–121)
Bilirubin Total: 0.4 mg/dL (ref 0.0–1.2)
CO2: 24 mmol/L (ref 20–29)
Calcium: 9.4 mg/dL (ref 8.6–10.2)
Creatinine, Ser: 1.21 mg/dL (ref 0.76–1.27)
Globulin, Total: 3.3 g/dL (ref 1.5–4.5)
Potassium: 3.6 mmol/L (ref 3.5–5.2)
Sodium: 142 mmol/L (ref 134–144)
Total Protein: 6.7 g/dL (ref 6.0–8.5)
eGFR: 55 mL/min/{1.73_m2} — ABNORMAL LOW (ref >59)

## 2022-10-03 LAB — VITAMIN D 25 HYDROXY (VIT D DEFICIENCY, FRACTURES): Vit D, 25-Hydroxy: 60.6 ng/mL (ref 30.0–100.0)

## 2022-10-09 DIAGNOSIS — D696 Thrombocytopenia, unspecified: Secondary | ICD-10-CM | POA: Diagnosis not present

## 2022-10-09 DIAGNOSIS — G9341 Metabolic encephalopathy: Secondary | ICD-10-CM | POA: Diagnosis not present

## 2022-10-09 DIAGNOSIS — E876 Hypokalemia: Secondary | ICD-10-CM | POA: Diagnosis not present

## 2022-10-09 DIAGNOSIS — E782 Mixed hyperlipidemia: Secondary | ICD-10-CM | POA: Diagnosis not present

## 2022-10-09 DIAGNOSIS — S065XAD Traumatic subdural hemorrhage with loss of consciousness status unknown, subsequent encounter: Secondary | ICD-10-CM | POA: Diagnosis not present

## 2022-10-09 DIAGNOSIS — I480 Paroxysmal atrial fibrillation: Secondary | ICD-10-CM | POA: Diagnosis not present

## 2022-10-09 DIAGNOSIS — S12600D Unspecified displaced fracture of seventh cervical vertebra, subsequent encounter for fracture with routine healing: Secondary | ICD-10-CM | POA: Diagnosis not present

## 2022-10-09 DIAGNOSIS — I700 Atherosclerosis of aorta: Secondary | ICD-10-CM | POA: Diagnosis not present

## 2022-10-09 DIAGNOSIS — I10 Essential (primary) hypertension: Secondary | ICD-10-CM | POA: Diagnosis not present

## 2022-10-09 DIAGNOSIS — K8689 Other specified diseases of pancreas: Secondary | ICD-10-CM | POA: Diagnosis not present

## 2022-10-09 DIAGNOSIS — J90 Pleural effusion, not elsewhere classified: Secondary | ICD-10-CM | POA: Diagnosis not present

## 2022-10-09 DIAGNOSIS — N390 Urinary tract infection, site not specified: Secondary | ICD-10-CM | POA: Diagnosis not present

## 2022-10-09 DIAGNOSIS — M4324 Fusion of spine, thoracic region: Secondary | ICD-10-CM | POA: Diagnosis not present

## 2022-10-09 DIAGNOSIS — J9811 Atelectasis: Secondary | ICD-10-CM | POA: Diagnosis not present

## 2022-10-09 DIAGNOSIS — I723 Aneurysm of iliac artery: Secondary | ICD-10-CM | POA: Diagnosis not present

## 2022-10-10 DIAGNOSIS — Z79899 Other long term (current) drug therapy: Secondary | ICD-10-CM | POA: Diagnosis not present

## 2022-10-10 DIAGNOSIS — J90 Pleural effusion, not elsewhere classified: Secondary | ICD-10-CM | POA: Diagnosis not present

## 2022-10-11 LAB — BASIC METABOLIC PANEL
BUN/Creatinine Ratio: 28 — ABNORMAL HIGH (ref 10–24)
BUN: 32 mg/dL (ref 10–36)
CO2: 26 mmol/L (ref 20–29)
Calcium: 9.8 mg/dL (ref 8.6–10.2)
Chloride: 103 mmol/L (ref 96–106)
Creatinine, Ser: 1.14 mg/dL (ref 0.76–1.27)
Glucose: 115 mg/dL — ABNORMAL HIGH (ref 70–99)
Potassium: 3.4 mmol/L — ABNORMAL LOW (ref 3.5–5.2)
Sodium: 143 mmol/L (ref 134–144)
eGFR: 60 mL/min/{1.73_m2} (ref >59)

## 2022-10-14 ENCOUNTER — Ambulatory Visit: Payer: Medicare Other | Attending: Internal Medicine | Admitting: Internal Medicine

## 2022-10-14 ENCOUNTER — Encounter: Payer: Self-pay | Admitting: Internal Medicine

## 2022-10-14 VITALS — BP 134/86 | HR 91 | Ht 69.0 in | Wt 179.8 lb

## 2022-10-14 DIAGNOSIS — I351 Nonrheumatic aortic (valve) insufficiency: Secondary | ICD-10-CM | POA: Diagnosis not present

## 2022-10-14 DIAGNOSIS — I5032 Chronic diastolic (congestive) heart failure: Secondary | ICD-10-CM

## 2022-10-14 DIAGNOSIS — J90 Pleural effusion, not elsewhere classified: Secondary | ICD-10-CM

## 2022-10-14 DIAGNOSIS — I77819 Aortic ectasia, unspecified site: Secondary | ICD-10-CM

## 2022-10-14 DIAGNOSIS — I4821 Permanent atrial fibrillation: Secondary | ICD-10-CM | POA: Diagnosis not present

## 2022-10-14 NOTE — Patient Instructions (Signed)

## 2022-10-14 NOTE — Progress Notes (Signed)
Cardiology Office Note  Date: 10/14/2022   ID: Brett Blankenship, DOB 07-09-1926, MRN [id number]  PCP:  Mechele Claude, MD  Cardiologist:  Marjo Bicker, MD Electrophysiologist:  None   Reason for Office Visit: Follow-up after thoracentesis   History of Present Illness: Brett Blankenship is a 89 y.o. male with atrial fibrillation, valvular heart disease (possible severe AI and mild AS on 05/28/2022), ascending aorta dilatation 4.5 cm presented to cardiology clinic for follow-up visit.   Patient had multiple ER and hospital admissions since 04/2022 for ADHF and falls. Eliquis was discontinued in 08/2022 due to fall resulting in ICH/SDH.  He underwent a CT angio PE in 09/24/2022 that showed moderate bilateral pleural effusions. P.o. Lasix was switched to torsemide 40 mg twice daily as he was not improving on Lasix.  He was also scheduled for ultrasound-guided thoracentesis on 10/01/2022 which yielded 800 cc of pleural fluid on the right side after which his symptoms significantly improved.  He does not have any more DOE, orthopnea, PND after the thoracentesis. His bilateral lower EXTR swelling also significantly improved on torsemide 40 mg twice daily.  Denies any angina, dizziness, lightheadedness, syncope.  Past Medical History:  Diagnosis Date   Atrial fibrillation (HCC)    C7 cervical fracture (HCC)    CHF (congestive heart failure) (HCC)    HYPERTENSION 07/12/2009   Leaky heart valve     Past Surgical History:  Procedure Laterality Date   APPENDECTOMY  1960   NECK SURGERY      Current Outpatient Medications  Medication Sig Dispense Refill   fish oil-omega-3 fatty acids 1000 MG capsule Take 2 g by mouth daily.     nabumetone (RELAFEN) 500 MG tablet TAKE 2 TABLETS BY MOUTH TWICE DAILY AS NEEDED FOR PAIN. (Patient taking differently: Take 1,000 mg by mouth 2 (two) times daily as needed for moderate pain.) 360 tablet 0   OVER THE COUNTER MEDICATION Take 1 tablet by mouth  daily. Vitamin B12     OVER THE COUNTER MEDICATION Apply 1 drop to eye daily. OTC eye drops, macu shield     potassium chloride SA (KLOR-CON M) 20 MEQ tablet Take 1 tablet (20 mEq total) by mouth 2 (two) times daily. 180 tablet 3   prednisoLONE acetate (PRED FORTE) 1 % ophthalmic suspension Place 1 drop into the left eye every other day.     torsemide (DEMADEX) 20 MG tablet Take 2 tablets (40 mg total) by mouth 2 (two) times daily. 120 tablet 1   Vitamin D, Ergocalciferol, (DRISDOL) 1.25 MG (50000 UNIT) CAPS capsule TAKE 1 CAPSULE BY MOUTH ONCE EVERY 7 DAYS. (Patient taking differently: Take 50,000 Units by mouth every 7 (seven) days. Takes on Sunday) 13 capsule 1   acetaminophen (TYLENOL) 650 MG CR tablet Take 650 mg by mouth every 8 (eight) hours as needed for pain. (Patient not taking: Reported on 10/14/2022)     Multiple Vitamins-Minerals (EYE VITAMINS & MINERALS PO) Take by mouth. [employer] (Patient not taking: Reported on 10/14/2022)     nitroGLYCERIN (NITROSTAT) 0.4 MG SL tablet Place 1 tablet (0.4 mg total) under the tongue every 5 (five) minutes x 3 doses as needed for chest pain (if no relief after 3rd dose, proceed to ED or call 911). (Patient not taking: Reported on 10/14/2022) 25 tablet 3   No current facility-administered medications for this visit.   Allergies:  Patient has no known allergies.   Social History: The patient  reports that he quit  smoking about 59 years ago. His smoking use included cigarettes. He has a 1.50 pack-year smoking history. He has never used smokeless tobacco. He reports that he does not currently use alcohol. He reports that he does not currently use drugs.   Family History: The patient's family history includes Cancer in his brother; Heart disease in his father; Hypertension in his father.   ROS:  Please see the history of present illness. Otherwise, complete review of systems is positive for none  All other systems are reviewed and negative.   Physical  Exam: VS:  BP 134/86   Pulse 91   Ht 5\' 9"  (1.753 m)   Wt 179 lb 12.8 oz (81.6 kg)   SpO2 98%   BMI 26.55 kg/m , BMI Body mass index is 26.55 kg/m.  Wt Readings from Last 3 Encounters:  10/14/22 179 lb 12.8 oz (81.6 kg)  10/02/22 178 lb 12.8 oz (81.1 kg)  09/30/22 185 lb 9.6 oz (84.2 kg)    General: Patient appears comfortable at rest. HEENT: Conjunctiva and lids normal, oropharynx clear with moist mucosa. Neck: Supple, no elevated JVP or carotid bruits, no thyromegaly. Lungs: Clear to auscultation, nonlabored breathing at rest. Cardiac: Regular rate and rhythm, no S3 or significant systolic murmur, no pericardial rub. Abdomen: Soft, nontender, no hepatomegaly, bowel sounds present, no guarding or rebound. Extremities: 1+ pitting edema, distal pulses 2+. Skin: Warm and dry. Musculoskeletal: No kyphosis. Neuropsychiatric: Alert and oriented x3, affect grossly appropriate.  Recent Labwork: 05/27/2022: TSH 1.180 09/06/2022: Magnesium 1.6 09/24/2022: B Natriuretic Peptide 999.0 10/02/2022: ALT 12; AST 27; Hemoglobin 11.6; Platelets 115 10/10/2022: BUN 32; Creatinine, Ser 1.14; Potassium 3.4; Sodium 143     Component Value Date/Time   CHOL 98 (L) 04/18/2022 1018   TRIG 121 04/18/2022 1018   HDL 26 (L) 04/18/2022 1018   CHOLHDL 3.8 04/18/2022 1018   LDLCALC 50 04/18/2022 1018    Assessment and Plan:  Patient is a 89 year old M known to have valvular heart disease (possible severe aortic valve regurgitation and mild aortic stenosis on 05/28/2022), persistent A-fib, ascending aorta dilatation 4.5 cm in 5/24 he is here for follow-up visit.  # Chronic diastolic heart failure, compensated # Moderate bilateral pleural effusions s/p R thoracentesis on 10/01/2022 -Ultrasound-guided right thoracentesis on 10/01/2022 yielded 800 cc of pleural fluid after which his symptoms of DOE, orthopnea, PND significantly improved.  Currently on torsemide 40 mg twice daily and his swelling in his legs also  improved tremendously (improved from 2-3+ to 1+).  Will continue the current regimen along with potassium supplements.  # Possible severe eccentric aortic valve regurgitation # Mild aortic valve stenosis -Patient not a candidate for surgical intervention due to advanced age. He refused any invasive procedures including TEE. -Continue torsemide 40 mg twice daily. Bilateral lower EXTR swelling significantly improved. Amlodipine was discontinued in the past due to bilateral lower EXTR swelling.  # Persistent A-fib -Rate controlling agents held due to possible severe AI -Eliquis discontinued due to ICH from fall in 08/2022 -Not a candidate for watchman due to patient refusal and severe AI  I have spent a total of 30 minutes with patient reviewing chart, EKGs, labs and examining patient as well as establishing an assessment and plan that was discussed with the patient.  > 50% of time was spent in direct patient care.    Medication Adjustments/Labs and Tests Ordered: Current medicines are reviewed at length with the patient today.  Concerns regarding medicines are outlined above.   Tests  Ordered: No orders of the defined types were placed in this encounter.   Medication Changes: No orders of the defined types were placed in this encounter.   Disposition:  Follow up  6 months  Signed Brighton Delio Verne Spurr, MD, 10/14/2022 11:02 AM    [facility] [address] [place], [place], [place] [address]

## 2022-10-16 ENCOUNTER — Telehealth: Payer: Self-pay

## 2022-10-16 MED ORDER — POTASSIUM CHLORIDE CRYS ER 20 MEQ PO TBCR: EXTENDED_RELEASE_TABLET | ORAL | 3 refills | Status: DC

## 2022-10-16 NOTE — Telephone Encounter (Signed)
-----   Message from Marjo Bicker, MD sent at 10/15/2022 12:44 PM EDT ----- K is 3.4. Increase Kcl to 40 mEq in AM and 20 mEq in PM.

## 2022-10-21 ENCOUNTER — Ambulatory Visit: Payer: Medicare Other | Admitting: Family Medicine

## 2022-10-28 LAB — MTHFR DNA ANALYSIS

## 2022-10-28 LAB — SPECIMEN STATUS REPORT

## 2022-11-11 DIAGNOSIS — S12591A Other nondisplaced fracture of sixth cervical vertebra, initial encounter for closed fracture: Secondary | ICD-10-CM | POA: Diagnosis not present

## 2022-12-03 ENCOUNTER — Other Ambulatory Visit: Payer: Self-pay | Admitting: Family Medicine

## 2022-12-03 DIAGNOSIS — G8929 Other chronic pain: Secondary | ICD-10-CM

## 2022-12-05 ENCOUNTER — Ambulatory Visit: Payer: Medicare Other | Admitting: Family Medicine

## 2022-12-06 ENCOUNTER — Ambulatory Visit: Payer: Medicare Other | Admitting: Family Medicine

## 2022-12-06 ENCOUNTER — Encounter: Payer: Self-pay | Admitting: Family Medicine

## 2022-12-06 VITALS — BP 137/72 | HR 67 | Temp 97.5°F | Resp 20 | Ht 69.0 in | Wt 183.4 lb

## 2022-12-06 DIAGNOSIS — B029 Zoster without complications: Secondary | ICD-10-CM

## 2022-12-06 MED ORDER — VALACYCLOVIR HCL 1 G PO TABS
1000.0000 mg | ORAL_TABLET | Freq: Three times a day (TID) | ORAL | 0 refills | Status: AC
Start: 2022-12-06 — End: 2022-12-16

## 2022-12-06 NOTE — Progress Notes (Signed)
Subjective:  Patient ID: Brett Blankenship, male    DOB: 1926-07-12, 89 y.o.   MRN: [id number]  Patient Care Team: Mechele Claude, MD as PCP - General (Family Medicine) Mallipeddi, Orion Modest, MD as PCP - Cardiology (Cardiology) Sherrie George, MD as Consulting Physician (Ophthalmology)   Chief Complaint:  Rash   HPI: Brett Blankenship is a 89 y.o. male presenting on 12/06/2022 for Rash   Rash This is a new problem. The current episode started yesterday. The affected locations include the back and chest. The rash is characterized by blistering, itchiness, pain and redness. He was exposed to nothing. Pertinent negatives include no anorexia, congestion, cough, diarrhea, eye pain, facial edema, fatigue, fever, joint pain, nail changes, rhinorrhea, shortness of breath, sore throat or vomiting. Past treatments include cold compress, moisturizer and anti-itch cream. The treatment provided no relief.        Relevant past medical, surgical, family, and social history reviewed and updated as indicated.  Allergies and medications reviewed and updated. Data reviewed: Chart in Epic.   Past Medical History:  Diagnosis Date   Atrial fibrillation (HCC)    C7 cervical fracture (HCC)    CHF (congestive heart failure) (HCC)    HYPERTENSION 07/12/2009   Leaky heart valve     Past Surgical History:  Procedure Laterality Date   APPENDECTOMY  1960   NECK SURGERY      Social History   Socioeconomic History   Marital status: Widowed    Spouse name: Not on file   Number of children: 2   Years of education: 7   Highest education level: 7th grade  Occupational History   Occupation: retired  Tobacco Use   Smoking status: Former    Current packs/day: 0.00    Average packs/day: 0.3 packs/day for 5.0 years (1.5 ttl pk-yrs)    Types: Cigarettes    Start date: 09/12/1958    Quit date: 09/12/1963    Years since quitting: 59.2   Smokeless tobacco: Never  Vaping Use   Vaping status: Never  Used  Substance and Sexual Activity   Alcohol use: Not Currently   Drug use: Not Currently   Sexual activity: Not Currently  Other Topics Concern   Not on file  Social History Narrative   Lives alone - daughter in law takes him shopping once a week and out to eat   Grandchildren and son stop by often   Social Determinants of Health   Financial Resource Strain: Low Risk  (03/01/2022)   Overall Financial Resource Strain (CARDIA)    Difficulty of Paying Living Expenses: Not hard at all  Food Insecurity: No Food Insecurity (09/06/2022)   Hunger Vital Sign    Worried About Running Out of Food in the Last Year: Never true    Ran Out of Food in the Last Year: Never true  Transportation Needs: No Transportation Needs (09/06/2022)   PRAPARE - Administrator, Civil Service (Medical): No    Lack of Transportation (Non-Medical): No  Physical Activity: Sufficiently Active (03/01/2022)   Exercise Vital Sign    Days of Exercise per Week: 5 days    Minutes of Exercise per Session: 30 min  Stress: No Stress Concern Present (03/01/2022)   [employer] of Occupational Health - Occupational Stress Questionnaire    Feeling of Stress : Not at all  Social Connections: Moderately Isolated (03/01/2022)   Social Connection and Isolation Panel [NHANES]    Frequency of Communication with  Friends and Family: More than three times a week    Frequency of Social Gatherings with Friends and Family: More than three times a week    Attends Religious Services: More than 4 times per year    Active Member of [employer] or Organizations: No    Attends Banker Meetings: Never    Marital Status: Widowed  Intimate Partner Violence: Not At Risk (09/06/2022)   Humiliation, Afraid, Rape, and Kick questionnaire    Fear of Current or Ex-Partner: No    Emotionally Abused: No    Physically Abused: No    Sexually Abused: No    Outpatient Encounter Medications as of 12/06/2022  Medication Sig   fish  oil-omega-3 fatty acids 1000 MG capsule Take 2 g by mouth daily.   OVER THE COUNTER MEDICATION Take 1 tablet by mouth daily. Vitamin B12   OVER THE COUNTER MEDICATION Apply 1 drop to eye daily. OTC eye drops, macu shield   potassium chloride SA (KLOR-CON M) 20 MEQ tablet Take 2 tablets (40 mEq total) by mouth every morning AND 1 tablet (20 mEq total) every evening.   prednisoLONE acetate (PRED FORTE) 1 % ophthalmic suspension Place 1 drop into the left eye every other day.   torsemide (DEMADEX) 20 MG tablet Take 2 tablets (40 mg total) by mouth 2 (two) times daily.   valACYclovir (VALTREX) 1000 MG tablet Take 1 tablet (1,000 mg total) by mouth 3 (three) times daily for 10 days.   Vitamin D, Ergocalciferol, (DRISDOL) 1.25 MG (50000 UNIT) CAPS capsule TAKE 1 CAPSULE BY MOUTH ONCE EVERY 7 DAYS. (Patient taking differently: Take 50,000 Units by mouth every 7 (seven) days. Takes on Sunday)   acetaminophen (TYLENOL) 650 MG CR tablet Take 650 mg by mouth every 8 (eight) hours as needed for pain. (Patient not taking: Reported on 10/14/2022)   Multiple Vitamins-Minerals (EYE VITAMINS & MINERALS PO) Take by mouth. [employer] (Patient not taking: Reported on 10/14/2022)   nabumetone (RELAFEN) 500 MG tablet TAKE 2 TABLETS BY MOUTH TWICE DAILY AS NEEDED FOR PAIN.   nitroGLYCERIN (NITROSTAT) 0.4 MG SL tablet Place 1 tablet (0.4 mg total) under the tongue every 5 (five) minutes x 3 doses as needed for chest pain (if no relief after 3rd dose, proceed to ED or call 911). (Patient not taking: Reported on 10/14/2022)   No facility-administered encounter medications on file as of 12/06/2022.    No Known Allergies  Review of Systems  Constitutional:  Negative for activity change, appetite change, chills, fatigue and fever.  HENT: Negative.  Negative for congestion, rhinorrhea and sore throat.   Eyes: Negative.  Negative for pain.  Respiratory:  Negative for cough, chest tightness and shortness of breath.    Cardiovascular:  Negative for chest pain, palpitations and leg swelling.  Gastrointestinal:  Negative for anorexia, blood in stool, constipation, diarrhea, nausea and vomiting.  Endocrine: Negative.   Genitourinary:  Negative for dysuria, frequency and urgency.  Musculoskeletal:  Negative for arthralgias, joint pain and myalgias.  Skin:  Positive for color change and rash. Negative for nail changes.  Allergic/Immunologic: Negative.   Neurological:  Negative for dizziness and headaches.  Hematological: Negative.   Psychiatric/Behavioral:  Negative for confusion, hallucinations, sleep disturbance and suicidal ideas.   All other systems reviewed and are negative.       Objective:  BP 137/72   Pulse 67   Temp (!) 97.5 F (36.4 C) (Oral)   Resp 20   Ht 5\' 9"  (1.753 m)  Wt 183 lb 6 oz (83.2 kg)   SpO2 96%   BMI 27.08 kg/m    Wt Readings from Last 3 Encounters:  12/06/22 183 lb 6 oz (83.2 kg)  10/14/22 179 lb 12.8 oz (81.6 kg)  10/02/22 178 lb 12.8 oz (81.1 kg)    Physical Exam Vitals and nursing note reviewed.  Constitutional:      General: He is not in acute distress.    Appearance: He is not ill-appearing, toxic-appearing or diaphoretic.  HENT:     Head: Normocephalic and atraumatic.     Right Ear: Decreased hearing noted.     Left Ear: Decreased hearing noted.     Mouth/Throat:     Mouth: Mucous membranes are moist.  Eyes:     Conjunctiva/sclera: Conjunctivae normal.     Pupils: Pupils are equal, round, and reactive to light.  Cardiovascular:     Rate and Rhythm: Normal rate. Rhythm irregularly irregular.  Pulmonary:     Effort: Pulmonary effort is normal.     Breath sounds: Normal breath sounds.  Musculoskeletal:     Cervical back: Neck supple.  Skin:    General: Skin is warm and dry.     Capillary Refill: Capillary refill takes less than 2 seconds.     Findings: Erythema and rash present. Rash is vesicular.       Neurological:     General: No focal  deficit present.     Mental Status: He is alert and oriented to person, place, and time.     Gait: Gait abnormal (slow, using cane).  Psychiatric:        Mood and Affect: Mood normal.        Behavior: Behavior normal.        Thought Content: Thought content normal.        Judgment: Judgment normal.     Results for orders placed or performed in visit on 10/02/22  CBC with Differential/Platelet  Result Value Ref Range   WBC 4.3 3.4 - 10.8 x10E3/uL   RBC 3.25 (L) 4.14 - 5.80 x10E6/uL   Hemoglobin 11.6 (L) 13.0 - 17.7 g/dL   Hematocrit 38.7 (L) 56.4 - 51.0 %   MCV 107 (H) 79 - 97 fL   MCH 35.7 (H) 26.6 - 33.0 pg   MCHC 33.3 31.5 - 35.7 g/dL   RDW 33.2 95.1 - 88.4 %   Platelets 115 (L) 150 - 450 x10E3/uL   Neutrophils 58 Not Estab. %   Lymphs 31 Not Estab. %   Monocytes 9 Not Estab. %   Eos 1 Not Estab. %   Basos 1 Not Estab. %   Neutrophils Absolute 2.5 1.4 - 7.0 x10E3/uL   Lymphocytes Absolute 1.3 0.7 - 3.1 x10E3/uL   Monocytes Absolute 0.4 0.1 - 0.9 x10E3/uL   EOS (ABSOLUTE) 0.0 0.0 - 0.4 x10E3/uL   Basophils Absolute 0.0 0.0 - 0.2 x10E3/uL   Immature Granulocytes 0 Not Estab. %   Immature Grans (Abs) 0.0 0.0 - 0.1 x10E3/uL  CMP14+EGFR  Result Value Ref Range   Glucose 80 70 - 99 mg/dL   BUN 19 10 - 36 mg/dL   Creatinine, Ser 1.66 0.76 - 1.27 mg/dL   eGFR 55 (L) >06 TK/ZSW/1.09   BUN/Creatinine Ratio 16 10 - 24   Sodium 142 134 - 144 mmol/L   Potassium 3.6 3.5 - 5.2 mmol/L   Chloride 103 96 - 106 mmol/L   CO2 24 20 - 29 mmol/L   Calcium 9.4 8.6 -  10.2 mg/dL   Total Protein 6.7 6.0 - 8.5 g/dL   Albumin 3.4 (L) 3.6 - 4.6 g/dL   Globulin, Total 3.3 1.5 - 4.5 g/dL   Albumin/Globulin Ratio 1.0 (L) 1.2 - 2.2   Bilirubin Total 0.4 0.0 - 1.2 mg/dL   Alkaline Phosphatase 78 44 - 121 IU/L   AST 27 0 - 40 IU/L   ALT 12 0 - 44 IU/L  VITAMIN D 25 Hydroxy (Vit-D Deficiency, Fractures)  Result Value Ref Range   Vit D, 25-Hydroxy 60.6 30.0 - 100.0 ng/mL  MTHFR DNA Analysis   Result Value Ref Range   MTHFR Comment    Reviewed by: Comment   Specimen status report  Result Value Ref Range   specimen status report Comment        Pertinent labs & imaging results that were available during my care of the patient were reviewed by me and considered in my medical decision making.  Assessment & Plan:  Ladre was seen today for rash.  Diagnoses and all orders for this visit:  Herpes zoster without complication Classic shingles rash. Will treat with below. Aware of symptomatic care at home. Report new, worsening, or persistent symptoms.  -     valACYclovir (VALTREX) 1000 MG tablet; Take 1 tablet (1,000 mg total) by mouth 3 (three) times daily for 10 days.     Continue all other maintenance medications.  Follow up plan: Return if symptoms worsen or fail to improve.   Continue healthy lifestyle choices, including diet (rich in fruits, vegetables, and lean proteins, and low in salt and simple carbohydrates) and exercise (at least 30 minutes of moderate physical activity daily).  Educational handout given for shingles  The above assessment and management plan was discussed with the patient. The patient verbalized understanding of and has agreed to the management plan. Patient is aware to call the clinic if they develop any new symptoms or if symptoms persist or worsen. Patient is aware when to return to the clinic for a follow-up visit. Patient educated on when it is appropriate to go to the emergency department.   Kari Baars, FNP-C [facility] [phone]

## 2022-12-08 ENCOUNTER — Emergency Department (HOSPITAL_COMMUNITY)
Admission: EM | Admit: 2022-12-08 | Discharge: 2022-12-08 | Disposition: A | Discharge status: Home / Self Care | Payer: Medicare Other | Attending: Emergency Medicine | Admitting: Emergency Medicine

## 2022-12-08 ENCOUNTER — Emergency Department (HOSPITAL_COMMUNITY): Payer: Medicare Other

## 2022-12-08 ENCOUNTER — Encounter (HOSPITAL_COMMUNITY): Payer: Self-pay

## 2022-12-08 ENCOUNTER — Other Ambulatory Visit: Payer: Self-pay

## 2022-12-08 DIAGNOSIS — B029 Zoster without complications: Secondary | ICD-10-CM | POA: Diagnosis not present

## 2022-12-08 DIAGNOSIS — R410 Disorientation, unspecified: Secondary | ICD-10-CM | POA: Insufficient documentation

## 2022-12-08 DIAGNOSIS — R4182 Altered mental status, unspecified: Secondary | ICD-10-CM | POA: Diagnosis not present

## 2022-12-08 DIAGNOSIS — D61818 Other pancytopenia: Secondary | ICD-10-CM | POA: Diagnosis not present

## 2022-12-08 DIAGNOSIS — R21 Rash and other nonspecific skin eruption: Secondary | ICD-10-CM | POA: Diagnosis not present

## 2022-12-08 DIAGNOSIS — R918 Other nonspecific abnormal finding of lung field: Secondary | ICD-10-CM | POA: Diagnosis not present

## 2022-12-08 DIAGNOSIS — I6523 Occlusion and stenosis of bilateral carotid arteries: Secondary | ICD-10-CM | POA: Diagnosis not present

## 2022-12-08 DIAGNOSIS — R9082 White matter disease, unspecified: Secondary | ICD-10-CM | POA: Diagnosis not present

## 2022-12-08 LAB — CBC WITH DIFFERENTIAL/PLATELET
Abs Immature Granulocytes: 0.02 10*3/uL (ref 0.00–0.07)
Basophils Absolute: 0 10*3/uL (ref 0.0–0.1)
Basophils Relative: 1 %
Eosinophils Absolute: 0 10*3/uL (ref 0.0–0.5)
Eosinophils Relative: 1 %
HCT: 30.1 % — ABNORMAL LOW (ref 39.0–52.0)
Hemoglobin: 10.6 g/dL — ABNORMAL LOW (ref 13.0–17.0)
Immature Granulocytes: 1 %
Lymphocytes Relative: 24 %
Lymphs Abs: 0.8 10*3/uL (ref 0.7–4.0)
MCH: 36.1 pg — ABNORMAL HIGH (ref 26.0–34.0)
MCHC: 35.2 g/dL (ref 30.0–36.0)
MCV: 102.4 fL — ABNORMAL HIGH (ref 80.0–100.0)
Monocytes Absolute: 0.4 10*3/uL (ref 0.1–1.0)
Monocytes Relative: 12 %
Neutro Abs: 1.9 10*3/uL (ref 1.7–7.7)
Neutrophils Relative %: 61 %
Platelets: 75 10*3/uL — ABNORMAL LOW (ref 150–400)
RBC: 2.94 MIL/uL — ABNORMAL LOW (ref 4.22–5.81)
RDW: 13.6 % (ref 11.5–15.5)
WBC: 3.2 10*3/uL — ABNORMAL LOW (ref 4.0–10.5)
nRBC: 0 % (ref 0.0–0.2)

## 2022-12-08 LAB — COMPREHENSIVE METABOLIC PANEL
ALT: 14 U/L (ref 0–44)
AST: 25 U/L (ref 15–41)
Albumin: 2.4 g/dL — ABNORMAL LOW (ref 3.5–5.0)
Alkaline Phosphatase: 57 U/L (ref 38–126)
Anion gap: 7 (ref 5–15)
BUN: 27 mg/dL — ABNORMAL HIGH (ref 8–23)
CO2: 26 mmol/L (ref 22–32)
Calcium: 9 mg/dL (ref 8.9–10.3)
Chloride: 100 mmol/L (ref 98–111)
Creatinine, Ser: 1.23 mg/dL (ref 0.61–1.24)
GFR, Estimated: 54 mL/min — ABNORMAL LOW (ref >60)
Glucose, Bld: 100 mg/dL — ABNORMAL HIGH (ref 70–99)
Potassium: 3.8 mmol/L (ref 3.5–5.1)
Sodium: 133 mmol/L — ABNORMAL LOW (ref 135–145)
Total Bilirubin: 1.5 mg/dL — ABNORMAL HIGH (ref 0.3–1.2)
Total Protein: 5.9 g/dL — ABNORMAL LOW (ref 6.5–8.1)

## 2022-12-08 LAB — AMMONIA: Ammonia: 49 umol/L — ABNORMAL HIGH (ref 9–35)

## 2022-12-08 NOTE — Discharge Instructions (Signed)
You were seen in the emergency department for evaluation of your shingles and some confusion that you had.  You had blood work and a CAT scan of your head that did not show an obvious explanation for your symptoms.  Your blood counts were low but similar to prior.  Your confusion earlier today is likely related to medications especially the valacyclovir.  This medication unfortunately is good for your shingles so I would recommend you continue to take it.  Follow-up with your regular doctor.  Return to the emergency department if any high fevers or recurrence of confusion or other neurologic symptoms.

## 2022-12-08 NOTE — ED Triage Notes (Signed)
Pt treated on 8/9 for shingles to back and chest with valacyclovir.  Pt reports waking up this am "feeling crazy and having weird dreams." Pt is A&O x4 on arrival.

## 2022-12-08 NOTE — ED Provider Notes (Signed)
[place] [facility] EMERGENCY DEPARTMENT AT [facility] Provider Note   CSN: [id number] Arrival date & time: 12/08/22  0849     History  Chief Complaint  Patient presents with   Rash    Brett Blankenship is a 89 y.o. male.  He is brought in by his son for evaluation of possible confusion.  He started with a rash over the right side of his trunk few days ago.  2 days ago saw his primary care doctor diagnosed with shingles and put on valacyclovir.  Was having significant pain so son gave him some oxycodone last night.  Patient states throughout the night he had 2 very strange dreams and woke up very disoriented.  Son said he refused to take his morning medications so he decided to bring him to the emergency department for further evaluation.  Patient recalls these dreams and said they were very disturbing.  He does not see any thing now that concerns him.  He knows where he is at and he knows he has shingles.  He denies any cough or abdominal pain vomiting diarrhea dysuria.  No headache.  The history is provided by the patient and a relative.  Rash Location:  Torso Torso rash location:  R breast, R flank and R chest Quality: blistering, painful and redness   Pain details:    Quality:  Aching   Severity:  Moderate   Onset quality:  Gradual   Duration:  4 days   Timing:  Constant   Progression:  Unchanged Associated symptoms: no abdominal pain, no fever, no headaches, no nausea, no shortness of breath and not vomiting        Home Medications Prior to Admission medications   Medication Sig Start Date End Date Taking? Authorizing Provider  acetaminophen (TYLENOL) 650 MG CR tablet Take 650 mg by mouth every 8 (eight) hours as needed for pain. Patient not taking: Reported on 10/14/2022    [provider]  fish oil-omega-3 fatty acids 1000 MG capsule Take 2 g by mouth daily.    [provider]  Multiple Vitamins-Minerals (EYE VITAMINS & MINERALS PO) Take by mouth.  [employer] Patient not taking: Reported on 10/14/2022    [provider]  nabumetone (RELAFEN) 500 MG tablet TAKE 2 TABLETS BY MOUTH TWICE DAILY AS NEEDED FOR PAIN. 12/04/22   Mechele Claude, MD  nitroGLYCERIN (NITROSTAT) 0.4 MG SL tablet Place 1 tablet (0.4 mg total) under the tongue every 5 (five) minutes x 3 doses as needed for chest pain (if no relief after 3rd dose, proceed to ED or call 911). Patient not taking: Reported on 10/14/2022 09/30/22   Mallipeddi, Vishnu P, MD  OVER THE COUNTER MEDICATION Take 1 tablet by mouth daily. Vitamin B12    [provider]  OVER THE COUNTER MEDICATION Apply 1 drop to eye daily. OTC eye drops, macu shield    [provider]  potassium chloride SA (KLOR-CON M) 20 MEQ tablet Take 2 tablets (40 mEq total) by mouth every morning AND 1 tablet (20 mEq total) every evening. 10/16/22   Mallipeddi, Vishnu P, MD  prednisoLONE acetate (PRED FORTE) 1 % ophthalmic suspension Place 1 drop into the left eye every other day. 08/12/22   [provider]  torsemide (DEMADEX) 20 MG tablet Take 2 tablets (40 mg total) by mouth 2 (two) times daily. 09/30/22   Mallipeddi, Vishnu P, MD  valACYclovir (VALTREX) 1000 MG tablet Take 1 tablet (1,000 mg total) by mouth 3 (three) times  daily for 10 days. 12/06/22 12/16/22  Sonny Masters, FNP  Vitamin D, Ergocalciferol, (DRISDOL) 1.25 MG (50000 UNIT) CAPS capsule TAKE 1 CAPSULE BY MOUTH ONCE EVERY 7 DAYS. Patient taking differently: Take 50,000 Units by mouth every 7 (seven) days. Takes on Sunday 06/11/22   Mechele Claude, MD      Allergies    Patient has no known allergies.    Review of Systems   Review of Systems  Constitutional:  Negative for fever.  Respiratory:  Negative for shortness of breath.   Gastrointestinal:  Negative for abdominal pain, nausea and vomiting.  Skin:  Positive for rash.  Neurological:  Negative for headaches.  Psychiatric/Behavioral:  Positive for confusion.     Physical  Exam Updated Vital Signs BP 131/61   Pulse 83   Temp 98 F (36.7 C)   Resp 20   Wt 83 kg   SpO2 94%   BMI 27.02 kg/m  Physical Exam Vitals and nursing note reviewed.  Constitutional:      General: He is not in acute distress.    Appearance: Normal appearance. He is well-developed.  HENT:     Head: Normocephalic and atraumatic.  Eyes:     Conjunctiva/sclera: Conjunctivae normal.  Cardiovascular:     Rate and Rhythm: Normal rate and regular rhythm.     Heart sounds: No murmur heard. Pulmonary:     Effort: Pulmonary effort is normal. No respiratory distress.     Breath sounds: Normal breath sounds.  Abdominal:     Palpations: Abdomen is soft.     Tenderness: There is no abdominal tenderness.  Musculoskeletal:        General: No deformity. Normal range of motion.     Cervical back: Neck supple.  Skin:    General: Skin is warm and dry.     Capillary Refill: Capillary refill takes less than 2 seconds.     Findings: Rash present.     Comments: He has a very typical shingles dermatomal rash over his right chest and right mid back.  He also has some thickened keratotic lesion under his left eye.  Son says he thinks this was new shingles that it has been spreading.  Patient states that it has been there for a while and it looks like a sun-exposed lesion  Neurological:     General: No focal deficit present.     Mental Status: He is alert.     Cranial Nerves: No cranial nerve deficit.     Sensory: No sensory deficit.     Motor: No weakness.     ED Results / Procedures / Treatments   Labs (all labs ordered are listed, but only abnormal results are displayed) Labs Reviewed  COMPREHENSIVE METABOLIC PANEL - Abnormal; Notable for the following components:      Result Value   Sodium 133 (*)    Glucose, Bld 100 (*)    BUN 27 (*)    Total Protein 5.9 (*)    Albumin 2.4 (*)    Total Bilirubin 1.5 (*)    GFR, Estimated 54 (*)    All other components within normal limits  CBC  WITH DIFFERENTIAL/PLATELET - Abnormal; Notable for the following components:   WBC 3.2 (*)    RBC 2.94 (*)    Hemoglobin 10.6 (*)    HCT 30.1 (*)    MCV 102.4 (*)    MCH 36.1 (*)    Platelets 75 (*)    All other components within normal  limits  AMMONIA - Abnormal; Notable for the following components:   Ammonia 49 (*)    All other components within normal limits    EKG None  Radiology CT Head Wo Contrast  Result Date: 12/08/2022 CLINICAL DATA:  Mental status change, unknown cause. Shingles outbreak. Patient reports strange dreams. EXAM: CT HEAD WITHOUT CONTRAST TECHNIQUE: Contiguous axial images were obtained from the base of the skull through the vertex without intravenous contrast. RADIATION DOSE REDUCTION: This exam was performed according to the departmental dose-optimization program which includes automated exposure control, adjustment of the mA and/or kV according to patient size and/or use of iterative reconstruction technique. COMPARISON:  None Available. FINDINGS: Brain: Moderate generalized atrophy and diffuse white matter disease is stable. Mild prominence of the extra-axial spaces is slightly less conspicuous than on prior exams. The ventricles are proportionate to the degree of atrophy. The brainstem and cerebellum are within normal limits. Midline structures are within normal limits. Vascular: Atherosclerotic calcifications are present in the cavernous internal carotid arteries and at the dural margin of the left vertebral artery. No hyperdense vessel is present. Skull: Calvarium is intact. No focal lytic or blastic lesions are present. No significant extracranial soft tissue lesion is present. Sinuses/Orbits: Bilateral lens replacements are noted. Globes and orbits are otherwise unremarkable. The paranasal sinuses and mastoid air cells are clear. IMPRESSION: 1. No acute intracranial abnormality or significant interval change. 2. Stable atrophy and diffuse white matter disease. This  likely reflects the sequela of chronic microvascular ischemia. Electronically Signed   By: Marin Roberts M.D.   On: 12/08/2022 10:20   DG Chest Port 1 View  Result Date: 12/08/2022 CLINICAL DATA:  Altered mental status EXAM: PORTABLE CHEST 1 VIEW COMPARISON:  10/01/2022 chest radiograph. FINDINGS: Stable cardiomediastinal silhouette with top-normal heart size. No pneumothorax. No pleural effusion. No pulmonary edema. Mild hazy patchy bibasilar lung opacities. IMPRESSION: Mild hazy patchy bibasilar lung opacities, favor atelectasis, difficult to exclude a component of aspiration or pneumonia. Chest radiograph follow-up advised. Electronically Signed   By: Delbert Phenix M.D.   On: 12/08/2022 09:57    Procedures Procedures    Medications Ordered in ED Medications - No data to display  ED Course/ Medical Decision Making/ A&P Clinical Course as of 12/08/22 1734  Sun Dec 08, 2022  1000 Chest x-ray without clear infiltrate but does have some atelectasis possible early infiltrate in the bases.  Awaiting radiology input [MB]  1034 Patient's labs showing some pancytopenia which she has had in the past.  Ammonia slightly elevated no priors to compare with unclear significance.  Head CT does not show any acute findings.  Patient is alert with clear sensorium now and her son feels he is back to baseline.  The valacyclovir can definitely cause some CNS confusion, hallucinations.  This is likely the cause although may also be the narcotics that he was given.  Son is comfortable taking him home.  He is asking about some medication for nerve pain, I told him to hold off on that now is that is all centrally acting also and may cause other confusion.  Recommended close follow-up with PCP.  Doubt CNS shingles at this time as he is back to his baseline. [MB]    Clinical Course User Index [MB] Terrilee Files, MD                                 Medical Decision Making Amount  and/or Complexity of Data  Reviewed Labs: ordered. Radiology: ordered.   This patient complains of rash on chest, confusion; this involves an extensive number of treatment Options and is a complaint that carries with it a high risk of complications and morbidity. The differential includes sepsis, Sirs, encephalitis, dehydration medication side effect infection  I ordered, reviewed and interpreted labs, which included CBC with pancytopenia, slightly worse than priors, chemistries with mildly low sodium, ammonia elevated unclear significance I ordered imaging studies which included chest x-ray and head CT and I independently    visualized and interpreted imaging which showed possible infiltrate in bases Additional history obtained from patient's son Previous records obtained and reviewed in epic including recent PCP and cardiology notes Cardiac monitoring reviewed, sinus rhythm Social determinants considered, no significant barriers Critical Interventions: None  After the interventions stated above, I reevaluated the patient and found patient to be awake alert and at baseline per his son Admission and further testing considered, no indications for admission or further workup at this time.  His mental status is at baseline.  His confusion is likely related to medications and pain lack of sleep.  Son will continue to monitor at home and follow-up with PCP.  Return instructions discussed.         Final Clinical Impression(s) / ED Diagnoses Final diagnoses:  Herpes zoster without complication  Confusion  Pancytopenia (HCC)    Rx / DC Orders ED Discharge Orders     None         Terrilee Files, MD 12/08/22 1737

## 2022-12-08 NOTE — ED Notes (Signed)
ED Provider at bedside. 

## 2022-12-11 ENCOUNTER — Ambulatory Visit: Payer: Medicare Other | Admitting: Internal Medicine

## 2022-12-13 ENCOUNTER — Telehealth: Payer: Self-pay

## 2022-12-13 ENCOUNTER — Ambulatory Visit: Payer: Medicare Other | Admitting: Internal Medicine

## 2022-12-13 NOTE — Telephone Encounter (Signed)
Transition Care Management Follow-up Telephone Call Date of discharge and from where: 12/08/2022 [facility] How have you been since you were released from the hospital? Spoke with patient's son, declined to participate.  Khalik Pewitt [facility]  [facility] Population Health Community Resource Care Guide   [email or web address]  ?? [phone]   Website: [email or web address]  [email or web address]

## 2022-12-25 ENCOUNTER — Encounter: Payer: Self-pay | Admitting: Family Medicine

## 2022-12-25 ENCOUNTER — Ambulatory Visit (INDEPENDENT_AMBULATORY_CARE_PROVIDER_SITE_OTHER): Payer: Medicare Other | Admitting: Family Medicine

## 2022-12-25 VITALS — BP 150/75 | HR 92 | Temp 97.3°F | Ht 69.0 in | Wt 180.2 lb

## 2022-12-25 DIAGNOSIS — B029 Zoster without complications: Secondary | ICD-10-CM | POA: Diagnosis not present

## 2022-12-25 MED ORDER — ACETAMINOPHEN 500 MG PO TABS
1000.0000 mg | ORAL_TABLET | Freq: Three times a day (TID) | ORAL | 99 refills | Status: DC
Start: 2022-12-25 — End: 2023-05-02

## 2022-12-25 MED ORDER — PREDNISONE 20 MG PO TABS
ORAL_TABLET | ORAL | 0 refills | Status: AC
Start: 2022-12-25 — End: ?

## 2022-12-25 MED ORDER — TRAMADOL HCL 50 MG PO TABS: 50.0000 mg | ORAL_TABLET | Freq: Four times a day (QID) | ORAL | 0 refills | Status: DC | PRN

## 2022-12-25 NOTE — Progress Notes (Signed)
Chief Complaint  Patient presents with   Herpes Zoster    HPI  Patient presents today for continued pain from shingles. Worst at the righ thoracic back. Still has some at the right chest.  No fever, chills. Has crusted. Was in ED since last seen here. Had taken an oxy for pain and had bad dreams from it.  PMH: Smoking status noted ROS: Per HPI  Objective: BP (!) 150/75   Pulse 92   Temp (!) 97.3 F (36.3 C)   Ht 5\' 9"  (1.753 m)   Wt 180 lb 3.2 oz (81.7 kg)   SpO2 97%   BMI 26.61 kg/m  Gen: NAD, alert, cooperative with exam HEENT: NCAT, EOMI, PERRL CV: RRR, good S1/S2, no murmur Resp: CTABL, no wheezes, non-labored Skin: right chest has one remaining lg crust of 2.5 cm. There are several over the right shoulder blaade area that have thick eschars or crusts. They are loose at the edges. There are no vesicles. They range from 1-4 cm each. Follow roughly T 4 dermatome. Do not cross the midline. Ext: No edema, warm Neuro: Alert and oriented, No gross deficits  Assessment and plan:  1. Herpes zoster without complication   Convalescent phase. Still painful. Hoping to avoid PHN.  Meds ordered this encounter  Medications   predniSONE (DELTASONE) 20 MG tablet    Sig: One twice daily with food for 2 weeks. Then one daily for 2 weeks    Dispense:  42 tablet    Refill:  0   traMADol (ULTRAM) 50 MG tablet    Sig: Take 1 tablet (50 mg total) by mouth 4 (four) times daily as needed for moderate pain.    Dispense:  50 tablet    Refill:  0   acetaminophen (TYLENOL) 500 MG tablet    Sig: Take 2 tablets (1,000 mg total) by mouth 3 (three) times daily. For pain    Dispense:  180 tablet    Refill:  PRN      Follow up 1 month  Mechele Claude, MD

## 2022-12-27 ENCOUNTER — Telehealth: Payer: Self-pay | Admitting: Internal Medicine

## 2022-12-27 ENCOUNTER — Other Ambulatory Visit: Payer: Self-pay | Admitting: Internal Medicine

## 2022-12-27 MED ORDER — TORSEMIDE 20 MG PO TABS: 40.0000 mg | ORAL_TABLET | Freq: Two times a day (BID) | ORAL | 1 refills | Status: DC

## 2022-12-27 NOTE — Telephone Encounter (Signed)
 *  STAT* If patient is at the pharmacy, call can be transferred to refill team.   1. Which medications need to be refilled? (please list name of each medication and dose if known) torsemide (DEMADEX) 20 MG tablet    2. Would you like to learn more about the convenience, safety, & potential cost savings by using the [facility]? No      3. Are you open to using the [facility] (Type [facility]. No    4. Which pharmacy/location (including street and city if local pharmacy) is medication to be sent to? [facility] - [place], [place] - [address]     5. Do they need a 30 day or 90 day supply? 90 days  Pt is completely out of meds and need refill today

## 2022-12-27 NOTE — Telephone Encounter (Signed)
90 day refill sent as requested,dose confirmed,pt in good standing

## 2023-01-06 ENCOUNTER — Ambulatory Visit: Payer: Medicare Other | Admitting: Family Medicine

## 2023-01-17 ENCOUNTER — Telehealth: Payer: Self-pay | Admitting: Family Medicine

## 2023-01-17 DIAGNOSIS — G8929 Other chronic pain: Secondary | ICD-10-CM

## 2023-01-17 NOTE — Telephone Encounter (Signed)
Informed that this was sent in on 12/04/22 for 90-d supply, he will contact pharmacy.

## 2023-01-17 NOTE — Telephone Encounter (Signed)
  Prescription Request  01/17/2023  What is the name of the medication or equipment? NABUMETONE  Have you contacted your pharmacy to request a refill? YES  Which pharmacy would you like this sent to? [facility]   Patient notified that their request is being sent to the clinical staff for review and that they should receive a response within 2 business days.

## 2023-01-22 ENCOUNTER — Ambulatory Visit: Payer: Medicare Other | Admitting: Internal Medicine

## 2023-01-27 ENCOUNTER — Encounter (HOSPITAL_COMMUNITY): Payer: Self-pay

## 2023-01-27 ENCOUNTER — Inpatient Hospital Stay (HOSPITAL_COMMUNITY)
Admission: EM | Admit: 2023-01-27 | Discharge: 2023-02-03 | DRG: 291 | Disposition: A | Discharge status: Home Health | Payer: Medicare Other | Attending: Internal Medicine | Admitting: Internal Medicine

## 2023-01-27 ENCOUNTER — Other Ambulatory Visit: Payer: Self-pay

## 2023-01-27 ENCOUNTER — Emergency Department (HOSPITAL_COMMUNITY): Payer: Medicare Other

## 2023-01-27 ENCOUNTER — Telehealth: Payer: Self-pay | Admitting: Internal Medicine

## 2023-01-27 DIAGNOSIS — R918 Other nonspecific abnormal finding of lung field: Secondary | ICD-10-CM | POA: Diagnosis not present

## 2023-01-27 DIAGNOSIS — I5032 Chronic diastolic (congestive) heart failure: Secondary | ICD-10-CM | POA: Diagnosis present

## 2023-01-27 DIAGNOSIS — E876 Hypokalemia: Secondary | ICD-10-CM | POA: Diagnosis not present

## 2023-01-27 DIAGNOSIS — Z6379 Other stressful life events affecting family and household: Secondary | ICD-10-CM | POA: Diagnosis not present

## 2023-01-27 DIAGNOSIS — R011 Cardiac murmur, unspecified: Secondary | ICD-10-CM | POA: Diagnosis present

## 2023-01-27 DIAGNOSIS — Z79899 Other long term (current) drug therapy: Secondary | ICD-10-CM

## 2023-01-27 DIAGNOSIS — Z1152 Encounter for screening for COVID-19: Secondary | ICD-10-CM

## 2023-01-27 DIAGNOSIS — I77819 Aortic ectasia, unspecified site: Secondary | ICD-10-CM | POA: Diagnosis present

## 2023-01-27 DIAGNOSIS — D696 Thrombocytopenia, unspecified: Secondary | ICD-10-CM | POA: Diagnosis present

## 2023-01-27 DIAGNOSIS — I480 Paroxysmal atrial fibrillation: Secondary | ICD-10-CM | POA: Diagnosis not present

## 2023-01-27 DIAGNOSIS — I878 Other specified disorders of veins: Secondary | ICD-10-CM | POA: Diagnosis not present

## 2023-01-27 DIAGNOSIS — Z789 Other specified health status: Secondary | ICD-10-CM

## 2023-01-27 DIAGNOSIS — I10 Essential (primary) hypertension: Secondary | ICD-10-CM | POA: Diagnosis present

## 2023-01-27 DIAGNOSIS — I5031 Acute diastolic (congestive) heart failure: Secondary | ICD-10-CM | POA: Diagnosis not present

## 2023-01-27 DIAGNOSIS — Z8249 Family history of ischemic heart disease and other diseases of the circulatory system: Secondary | ICD-10-CM | POA: Diagnosis not present

## 2023-01-27 DIAGNOSIS — Z8679 Personal history of other diseases of the circulatory system: Secondary | ICD-10-CM | POA: Diagnosis not present

## 2023-01-27 DIAGNOSIS — I352 Nonrheumatic aortic (valve) stenosis with insufficiency: Secondary | ICD-10-CM | POA: Diagnosis present

## 2023-01-27 DIAGNOSIS — I110 Hypertensive heart disease with heart failure: Secondary | ICD-10-CM | POA: Diagnosis not present

## 2023-01-27 DIAGNOSIS — J984 Other disorders of lung: Secondary | ICD-10-CM | POA: Diagnosis not present

## 2023-01-27 DIAGNOSIS — I5033 Acute on chronic diastolic (congestive) heart failure: Secondary | ICD-10-CM | POA: Diagnosis present

## 2023-01-27 DIAGNOSIS — I130 Hypertensive heart and chronic kidney disease with heart failure and stage 1 through stage 4 chronic kidney disease, or unspecified chronic kidney disease: Secondary | ICD-10-CM | POA: Diagnosis not present

## 2023-01-27 DIAGNOSIS — I4821 Permanent atrial fibrillation: Secondary | ICD-10-CM | POA: Diagnosis present

## 2023-01-27 DIAGNOSIS — E782 Mixed hyperlipidemia: Secondary | ICD-10-CM | POA: Diagnosis present

## 2023-01-27 DIAGNOSIS — I509 Heart failure, unspecified: Principal | ICD-10-CM

## 2023-01-27 DIAGNOSIS — I444 Left anterior fascicular block: Secondary | ICD-10-CM | POA: Diagnosis present

## 2023-01-27 DIAGNOSIS — D539 Nutritional anemia, unspecified: Secondary | ICD-10-CM | POA: Diagnosis not present

## 2023-01-27 DIAGNOSIS — K862 Cyst of pancreas: Secondary | ICD-10-CM | POA: Diagnosis not present

## 2023-01-27 DIAGNOSIS — Z66 Do not resuscitate: Secondary | ICD-10-CM | POA: Diagnosis not present

## 2023-01-27 DIAGNOSIS — Z8619 Personal history of other infectious and parasitic diseases: Secondary | ICD-10-CM

## 2023-01-27 DIAGNOSIS — I351 Nonrheumatic aortic (valve) insufficiency: Secondary | ICD-10-CM | POA: Diagnosis present

## 2023-01-27 DIAGNOSIS — J90 Pleural effusion, not elsewhere classified: Secondary | ICD-10-CM | POA: Diagnosis not present

## 2023-01-27 DIAGNOSIS — I77810 Thoracic aortic ectasia: Secondary | ICD-10-CM | POA: Diagnosis not present

## 2023-01-27 DIAGNOSIS — Z87891 Personal history of nicotine dependence: Secondary | ICD-10-CM | POA: Diagnosis not present

## 2023-01-27 DIAGNOSIS — I5A Non-ischemic myocardial injury (non-traumatic): Secondary | ICD-10-CM | POA: Diagnosis not present

## 2023-01-27 DIAGNOSIS — I7121 Aneurysm of the ascending aorta, without rupture: Secondary | ICD-10-CM | POA: Diagnosis not present

## 2023-01-27 DIAGNOSIS — Z87820 Personal history of traumatic brain injury: Secondary | ICD-10-CM | POA: Diagnosis not present

## 2023-01-27 DIAGNOSIS — I4891 Unspecified atrial fibrillation: Secondary | ICD-10-CM | POA: Diagnosis present

## 2023-01-27 DIAGNOSIS — I4819 Other persistent atrial fibrillation: Secondary | ICD-10-CM | POA: Diagnosis present

## 2023-01-27 DIAGNOSIS — N1832 Chronic kidney disease, stage 3b: Secondary | ICD-10-CM | POA: Diagnosis present

## 2023-01-27 HISTORY — DX: Nonrheumatic aortic (valve) insufficiency: I35.1

## 2023-01-27 LAB — COMPREHENSIVE METABOLIC PANEL
ALT: 19 U/L (ref 0–44)
AST: 26 U/L (ref 15–41)
Albumin: 2.5 g/dL — ABNORMAL LOW (ref 3.5–5.0)
Alkaline Phosphatase: 57 U/L (ref 38–126)
Anion gap: 11 (ref 5–15)
BUN: 27 mg/dL — ABNORMAL HIGH (ref 8–23)
CO2: 26 mmol/L (ref 22–32)
Calcium: 8.6 mg/dL — ABNORMAL LOW (ref 8.9–10.3)
Chloride: 102 mmol/L (ref 98–111)
Creatinine, Ser: 1.23 mg/dL (ref 0.61–1.24)
GFR, Estimated: 54 mL/min — ABNORMAL LOW (ref >60)
Glucose, Bld: 145 mg/dL — ABNORMAL HIGH (ref 70–99)
Potassium: 3.6 mmol/L (ref 3.5–5.1)
Sodium: 139 mmol/L (ref 135–145)
Total Bilirubin: 1.1 mg/dL (ref 0.3–1.2)
Total Protein: 5.7 g/dL — ABNORMAL LOW (ref 6.5–8.1)

## 2023-01-27 LAB — CBC WITH DIFFERENTIAL/PLATELET
Abs Immature Granulocytes: 0.01 10*3/uL (ref 0.00–0.07)
Basophils Absolute: 0 10*3/uL (ref 0.0–0.1)
Basophils Relative: 0 %
Eosinophils Absolute: 0.1 10*3/uL (ref 0.0–0.5)
Eosinophils Relative: 3 %
HCT: 35.7 % — ABNORMAL LOW (ref 39.0–52.0)
Hemoglobin: 12 g/dL — ABNORMAL LOW (ref 13.0–17.0)
Immature Granulocytes: 0 %
Lymphocytes Relative: 18 %
Lymphs Abs: 0.9 10*3/uL (ref 0.7–4.0)
MCH: 37.5 pg — ABNORMAL HIGH (ref 26.0–34.0)
MCHC: 33.6 g/dL (ref 30.0–36.0)
MCV: 111.6 fL — ABNORMAL HIGH (ref 80.0–100.0)
Monocytes Absolute: 0.4 10*3/uL (ref 0.1–1.0)
Monocytes Relative: 7 %
Neutro Abs: 3.5 10*3/uL (ref 1.7–7.7)
Neutrophils Relative %: 72 %
Platelets: 121 10*3/uL — ABNORMAL LOW (ref 150–400)
RBC: 3.2 MIL/uL — ABNORMAL LOW (ref 4.22–5.81)
RDW: 15.8 % — ABNORMAL HIGH (ref 11.5–15.5)
WBC: 4.9 10*3/uL (ref 4.0–10.5)
nRBC: 0 % (ref 0.0–0.2)

## 2023-01-27 LAB — TROPONIN I (HIGH SENSITIVITY)
Troponin I (High Sensitivity): 157 ng/L (ref <18)
Troponin I (High Sensitivity): 138 ng/L (ref <18)

## 2023-01-27 LAB — SARS CORONAVIRUS 2 BY RT PCR: SARS Coronavirus 2 by RT PCR: NEGATIVE

## 2023-01-27 LAB — MAGNESIUM: Magnesium: 1.9 mg/dL (ref 1.7–2.4)

## 2023-01-27 LAB — BRAIN NATRIURETIC PEPTIDE: B Natriuretic Peptide: 1031 pg/mL — ABNORMAL HIGH (ref 0.0–100.0)

## 2023-01-27 MED ORDER — ACETAMINOPHEN 325 MG PO TABS
650.0000 mg | ORAL_TABLET | ORAL | Status: DC | PRN
  Administered 2023-01-29: 650 mg via ORAL
  Filled 2023-01-27: qty 2

## 2023-01-27 MED ORDER — ONDANSETRON HCL 4 MG/2ML IJ SOLN: 4.0000 mg | Freq: Four times a day (QID) | INTRAMUSCULAR | Status: DC | PRN

## 2023-01-27 MED ORDER — SODIUM CHLORIDE 0.9 % IV SOLN: 250.0000 mL | INTRAVENOUS | Status: DC | PRN

## 2023-01-27 MED ORDER — TRAZODONE HCL 50 MG PO TABS: 25.0000 mg | ORAL_TABLET | Freq: Every evening | ORAL | Status: DC | PRN

## 2023-01-27 MED ORDER — HEPARIN SODIUM (PORCINE) 5000 UNIT/ML IJ SOLN
5000.0000 [IU] | Freq: Three times a day (TID) | INTRAMUSCULAR | Status: DC
  Administered 2023-01-27 – 2023-02-03 (×20): 5000 [IU] via SUBCUTANEOUS
  Filled 2023-01-27 (×20): qty 1

## 2023-01-27 MED ORDER — POTASSIUM CHLORIDE CRYS ER 20 MEQ PO TBCR
40.0000 meq | EXTENDED_RELEASE_TABLET | Freq: Once | ORAL | Status: AC
  Administered 2023-01-27: 40 meq via ORAL
  Filled 2023-01-27: qty 2

## 2023-01-27 MED ORDER — SODIUM CHLORIDE 0.9% FLUSH: 3.0000 mL | INTRAVENOUS | Status: DC | PRN

## 2023-01-27 MED ORDER — FUROSEMIDE 10 MG/ML IJ SOLN
80.0000 mg | Freq: Once | INTRAMUSCULAR | Status: AC
  Administered 2023-01-27: 80 mg via INTRAVENOUS
  Filled 2023-01-27: qty 8

## 2023-01-27 MED ORDER — FUROSEMIDE 10 MG/ML IJ SOLN
40.0000 mg | Freq: Two times a day (BID) | INTRAMUSCULAR | Status: DC
  Administered 2023-01-27 – 2023-01-29 (×5): 40 mg via INTRAVENOUS
  Filled 2023-01-27 (×7): qty 4

## 2023-01-27 MED ORDER — SODIUM CHLORIDE 0.9% FLUSH
3.0000 mL | Freq: Two times a day (BID) | INTRAVENOUS | Status: DC
  Administered 2023-01-27 – 2023-02-03 (×15): 3 mL via INTRAVENOUS

## 2023-01-27 MED ORDER — TRAMADOL HCL 50 MG PO TABS
50.0000 mg | ORAL_TABLET | Freq: Four times a day (QID) | ORAL | Status: DC | PRN
  Administered 2023-01-29 – 2023-02-02 (×3): 50 mg via ORAL
  Filled 2023-01-27 (×3): qty 1

## 2023-01-27 MED ORDER — OMEGA-3-ACID ETHYL ESTERS 1 G PO CAPS
2.0000 g | ORAL_CAPSULE | Freq: Every day | ORAL | Status: DC
  Administered 2023-01-28 – 2023-02-02 (×6): 2 g via ORAL
  Filled 2023-01-27 (×7): qty 2

## 2023-01-27 NOTE — Telephone Encounter (Signed)
Patient's son is calling to let Dr. Jenene Slicker know that patient is in hospital again at [facility].

## 2023-01-27 NOTE — ED Triage Notes (Signed)
Pt presents with SOB, fluid retention, for several days, history of CHF & Afib.

## 2023-01-27 NOTE — Hospital Course (Addendum)
89 year old gentleman with diastolic heart failure, atrial fibrillation (dx 01/24), recovered from bout of herpes zoster earlier this year, history of severe eccentric aortic valve regurgitation, history of subdural hematoma after a fall and subsequently taken off apixaban May 2024, ascending aorta dilatation (49 mm), former smoker, HTN, Hyperlipidemia, known cystic lesion in the pancreas (measuring up to 21 mm), recent bouts of acute HF exacerbations this year presents with shortness of breath.  He has been followed closely by cardiology this year.  He declined further work up for his valvular disease and deemed not a candidate for surgical intervention due to advanced age.  He had been changed to torsemide 40 mg twice daily in June 2024 for better diuresis and reports compliance with taking medication.   He says that for the past couple of days he has had progressive edema in both legs and feet.  He feels tired and he had to sleep in the recliner last night because of shortness of breath.  He denies chest pain.  He is easily fatigued.  He denies palpitations.  He reports that his weight is about 5 pounds higher than normal.  In the ED he was evaluated and noted to have a BNP >1000.  He had clinical exam findings of heart failure.  He was given IV furosemide 80 mg and admission was requested for further management of acute heart failure.

## 2023-01-27 NOTE — Telephone Encounter (Signed)
Spoke to son who stated that he (son) is scheduled to see Dr. Cliffton Asters. Pt's son stated that he may be going through a triple bypass (son) and may be unable to bring pt for hospital f/u with provider. Son stated he may be able to get his daughter in law to bring pt.   Provider has no opening at this time. Would you like to schedule pt on a hospital day?

## 2023-01-27 NOTE — ED Notes (Signed)
ED TO INPATIENT HANDOFF REPORT  ED Nurse Name and Phone #: Jennette Kettle, RN [phone]  S Name/Age/Gender Brett Blankenship 89 y.o. male Room/Bed: APA02/APA02  Code Status   Code Status: Prior  Home/SNF/Other Home Patient oriented to: self, place, time, and situation Is this baseline? Yes   Triage Complete: Triage complete  Chief Complaint Acute heart failure (HCC) [I50.9]  Triage Note Pt presents with SOB, fluid retention, for several days, history of CHF & Afib.   Allergies No Known Allergies  Level of Care/Admitting Diagnosis ED Disposition     ED Disposition  Admit   Condition  --   Comment  Hospital [facility]: [facility] [[id number]]  Level of Care: Telemetry [5]  Covid Evaluation: Asymptomatic - no recent exposure (last 10 days) testing not required  Diagnosis: Acute heart failure [facility]) [[id number]]  Admitting Physician: Cleora Fleet [[id number]]  Attending Physician: Cleora Fleet [[id number]]  Certification:: I certify this patient will need inpatient services for at least 2 midnights  Expected Medical Readiness: 01/31/2023          B Medical/Surgery History Past Medical History:  Diagnosis Date   Atrial fibrillation (HCC)    C7 cervical fracture (HCC)    CHF (congestive heart failure) (HCC)    HYPERTENSION 07/12/2009   Leaky heart valve    Past Surgical History:  Procedure Laterality Date   APPENDECTOMY  1960   NECK SURGERY       A IV Location/Drains/Wounds Patient Lines/Drains/Airways Status     Active Line/Drains/Airways     Name Placement date Placement time Site Days   Peripheral IV 01/27/23 20 G 1" Anterior;Proximal;Right Forearm 01/27/23  1053  Forearm  less than 1            Intake/Output Last 24 hours  Intake/Output Summary (Last 24 hours) at 01/27/2023 1355 Last data filed at 01/27/2023 1354 Gross per 24 hour  Intake --  Output 590 ml  Net -590 ml    Labs/Imaging Results for orders placed or performed during the hospital  encounter of 01/27/23 (from the past 48 hour(s))  SARS Coronavirus 2 by RT PCR (hospital order, performed in [facility] hospital lab) *cepheid single result test* Anterior Nasal Swab     Status: None   Collection Time: 01/27/23 10:05 AM   Specimen: Anterior Nasal Swab  Result Value Ref Range   SARS Coronavirus 2 by RT PCR NEGATIVE NEGATIVE    Comment: (NOTE) SARS-CoV-2 target nucleic acids are NOT DETECTED.  The SARS-CoV-2 RNA is generally detectable in upper and lower respiratory specimens during the acute phase of infection. The lowest concentration of SARS-CoV-2 viral copies this assay can detect is 250 copies / mL. A negative result does not preclude SARS-CoV-2 infection and should not be used as the sole basis for treatment or other patient management decisions.  A negative result may occur with improper specimen collection / handling, submission of specimen other than nasopharyngeal swab, presence of viral mutation(s) within the areas targeted by this assay, and inadequate number of viral copies (<250 copies / mL). A negative result must be combined with clinical observations, patient history, and epidemiological information.  Fact Sheet for Patients:   RoadLapTop.co.za  Fact Sheet for Healthcare Providers: http://kim-miller.com/  This test is not yet approved or  cleared by the [place] FDA and has been authorized for detection and/or diagnosis of SARS-CoV-2 by FDA under an Emergency Use Authorization (EUA).  This EUA will remain in effect (meaning this test can be  used) for the duration of the COVID-19 declaration under Section 564(b)(1) of the Act, 21 U.S.C. section 360bbb-3(b)(1), unless the authorization is terminated or revoked sooner.  Performed at [facility], [address], [place], [place] [address]   Comprehensive metabolic panel     Status: Abnormal   Collection Time: 01/27/23 10:06 AM  Result Value Ref Range    Sodium 139 135 - 145 mmol/L   Potassium 3.6 3.5 - 5.1 mmol/L   Chloride 102 98 - 111 mmol/L   CO2 26 22 - 32 mmol/L   Glucose, Bld 145 (H) 70 - 99 mg/dL    Comment: Glucose reference range applies only to samples taken after fasting for at least 8 hours.   BUN 27 (H) 8 - 23 mg/dL   Creatinine, Ser 2.95 0.61 - 1.24 mg/dL   Calcium 8.6 (L) 8.9 - 10.3 mg/dL   Total Protein 5.7 (L) 6.5 - 8.1 g/dL   Albumin 2.5 (L) 3.5 - 5.0 g/dL   AST 26 15 - 41 U/L   ALT 19 0 - 44 U/L   Alkaline Phosphatase 57 38 - 126 U/L   Total Bilirubin 1.1 0.3 - 1.2 mg/dL   GFR, Estimated 54 (L) >60 mL/min    Comment: (NOTE) Calculated using the CKD-EPI Creatinine Equation (2021)    Anion gap 11 5 - 15    Comment: Performed at [facility], [address] [place], [place] [address]  CBC with Differential     Status: Abnormal   Collection Time: 01/27/23 10:06 AM  Result Value Ref Range   WBC 4.9 4.0 - 10.5 K/uL   RBC 3.20 (L) 4.22 - 5.81 MIL/uL   Hemoglobin 12.0 (L) 13.0 - 17.0 g/dL   HCT 86.5 (L) 78.4 - 69.6 %   MCV 111.6 (H) 80.0 - 100.0 fL   MCH 37.5 (H) 26.0 - 34.0 pg   MCHC 33.6 30.0 - 36.0 g/dL   RDW 29.5 (H) 28.4 - 13.2 %   Platelets 121 (L) 150 - 400 K/uL   nRBC 0.0 0.0 - 0.2 %   Neutrophils Relative % 72 %   Neutro Abs 3.5 1.7 - 7.7 K/uL   Lymphocytes Relative 18 %   Lymphs Abs 0.9 0.7 - 4.0 K/uL   Monocytes Relative 7 %   Monocytes Absolute 0.4 0.1 - 1.0 K/uL   Eosinophils Relative 3 %   Eosinophils Absolute 0.1 0.0 - 0.5 K/uL   Basophils Relative 0 %   Basophils Absolute 0.0 0.0 - 0.1 K/uL   WBC Morphology MORPHOLOGY UNREMARKABLE    RBC Morphology See Note     Comment: MACROCYTOSIS   Smear Review MORPHOLOGY UNREMARKABLE    Immature Granulocytes 0 %   Abs Immature Granulocytes 0.01 0.00 - 0.07 K/uL   Ovalocytes PRESENT     Comment: Performed at [facility], [address], [place], [place] [address]  Troponin I (High Sensitivity)     Status: Abnormal   Collection Time: 01/27/23 10:06  AM  Result Value Ref Range   Troponin I (High Sensitivity) 157 (HH) <18 ng/L    Comment: CRITICAL RESULT CALLED TO, READ BACK BY AND VERIFIED WITH Brad Lieurance,D AT 10:42 ON 01/27/23 BY PURDIE,J (NOTE) Elevated high sensitivity troponin I (hsTnI) values and significant  changes across serial measurements may suggest ACS but many other  chronic and acute conditions are known to elevate hsTnI results.  Refer to the "Links" section for chest pain algorithms and additional  guidance. Performed at [facility],  [address], [place], [place] [address]   Brain natriuretic peptide     Status: Abnormal   Collection Time: 01/27/23 10:06 AM  Result Value Ref Range   B Natriuretic Peptide 1,031.0 (H) 0.0 - 100.0 pg/mL    Comment: Performed at [facility], [address] [place], [place] [address]  Magnesium     Status: None   Collection Time: 01/27/23 10:06 AM  Result Value Ref Range   Magnesium 1.9 1.7 - 2.4 mg/dL    Comment: Performed at [facility], [address] [place], [place] [address]  Troponin I (High Sensitivity)     Status: Abnormal   Collection Time: 01/27/23 12:03 PM  Result Value Ref Range   Troponin I (High Sensitivity) 138 (HH) <18 ng/L    Comment: CRITICAL RESULT CALLED TO, READ BACK BY AND VERIFIED WITH Aleksa Catterton,D AT 12:37 ON 9&R&30&R&24 BY PURDIE,J (NOTE) Elevated high sensitivity troponin I (hsTnI) values and significant  changes across serial measurements may suggest ACS but many other  chronic and acute conditions are known to elevate hsTnI results.  Refer to the "Links" section for chest pain algorithms and additional  guidance. Performed at [facility], [address], [place], [place] [address]    DG Chest 2 View  Result Date: 01/27/2023 CLINICAL DATA:  Provided history: Shortness of breath.  Shingles. EXAM: CHEST - 2 VIEW COMPARISON:  Prior chest radiographs 12/08/2022 and earlier. Chest CT 09/24/2022. FINDINGS: Cardiomegaly. A known aneurysm of the ascending thoracic  aorta was better appreciated on the prior chest CT of 09/24/2022. Aortic atherosclerosis. Mild ill-defined and bandlike opacities at the lung bases. Trace bilateral pleural effusions. No evidence of pneumothorax. No acute osseous abnormality identified. Spondylosis and dextrocurvature of the thoracic spine. IMPRESSION: 1. Mild ill-defined and bandlike opacities at the lung bases. While these findings may reflect atelectasis, pneumonia cannot be excluded. Radiographic follow-up to resolution recommended. 2. Trace bilateral pleural effusions. 3. Cardiomegaly. 4. Known ascending thoracic aortic aneurysm better appreciated on the prior chest CT of 09/24/2022. 5. Aortic Atherosclerosis (ICD10-I70.0). Electronically Signed   By: Jackey Loge D.O.   On: 01/27/2023 12:04    Pending Labs Unresulted Labs (From admission, onward)    None       Vitals/Pain Today's Vitals   01/27/23 0944 01/27/23 0944 01/27/23 1353  BP:  (!) 151/80 (!) 152/83  Pulse:  95 82  Resp:  18 18  Temp:  98 F (36.7 C) 98.2 F (36.8 C)  TempSrc:  Oral Oral  SpO2:  98% 94%  Weight: 83 kg    Height: 5\' 9"  (1.753 m)    PainSc: 0-No pain      Isolation Precautions Airborne and Contact precautions  Medications Medications  potassium chloride SA (KLOR-CON M) CR tablet 40 mEq (40 mEq Oral Given 01/27/23 1110)  furosemide (LASIX) injection 80 mg (80 mg Intravenous Given 01/27/23 1107)    Mobility walks     Focused Assessments Cardiac Assessment Handoff:  Cardiac Rhythm: Normal sinus rhythm No results found for: "CKTOTAL", "CKMB", "CKMBINDEX", "TROPONINI" No results found for: "DDIMER" Does the Patient currently have chest pain? No    R Recommendations: See Admitting Provider Note  Report given to:   Additional Notes: pt given 80 lasix IV, +4 pitting edema, sits on side of bed to use urinal

## 2023-01-27 NOTE — ED Provider Notes (Signed)
[place] EMERGENCY DEPARTMENT AT [facility] Provider Note   CSN: [id number] Arrival date & time: 01/27/23  0935     History  Chief Complaint  Patient presents with   Shortness of Breath    Brett Blankenship is a 89 y.o. male.   Shortness of Breath Patient presents for shortness of breath.  Medical history includes HTN, atrial fibrillation, CHF, CKD, HLD.  He was previously on the Lasix.  In June, he was switched to torsemide.  Dose is 40 mg daily.  He has been adherent to this.  Despite home use of diuretics, he has had worsened bilateral leg swelling and shortness of breath.  His son, who accompanies him at bedside reports orthopnea at night despite head of bed being propped up.  He has had exercise intolerance.  He is approximately 5 pounds up from his normal weight.     Home Medications Prior to Admission medications   Medication Sig Start Date End Date Taking? Authorizing Provider  acetaminophen (TYLENOL) 500 MG tablet Take 2 tablets (1,000 mg total) by mouth 3 (three) times daily. For pain 12/25/22  Yes Stacks, Broadus [name], MD  fish oil-omega-3 fatty acids 1000 MG capsule Take 2 g by mouth daily.   Yes [provider]  nabumetone (RELAFEN) 500 MG tablet TAKE 2 TABLETS BY MOUTH TWICE DAILY AS NEEDED FOR PAIN. Patient taking differently: Take 1,000 mg by mouth 2 (two) times daily as needed for moderate pain. TAKE 2 TABLETS BY MOUTH TWICE DAILY AS NEEDED FOR PAIN. 12/04/22  Yes Stacks, Broadus [name], MD  nitroGLYCERIN (NITROSTAT) 0.4 MG SL tablet Place 1 tablet (0.4 mg total) under the tongue every 5 (five) minutes x 3 doses as needed for chest pain (if no relief after 3rd dose, proceed to ED or call 911). 09/30/22  Yes Mallipeddi, Vishnu P, MD  OVER THE COUNTER MEDICATION Take 1 tablet by mouth daily. Vitamin B12   Yes [provider]  OVER THE COUNTER MEDICATION Take 1 tablet by mouth daily. Eye vitamin   Yes [provider]  potassium chloride SA (KLOR-CON  M) 20 MEQ tablet Take 2 tablets (40 mEq total) by mouth every morning AND 1 tablet (20 mEq total) every evening. 10/16/22  Yes Mallipeddi, Vishnu P, MD  torsemide (DEMADEX) 20 MG tablet Take 2 tablets (40 mg total) by mouth 2 (two) times daily. 12/27/22  Yes Mallipeddi, Vishnu P, MD  traMADol (ULTRAM) 50 MG tablet Take 1 tablet (50 mg total) by mouth 4 (four) times daily as needed for moderate pain. 12/25/22  Yes Mechele Claude, MD  Vitamin D, Ergocalciferol, (DRISDOL) 1.25 MG (50000 UNIT) CAPS capsule TAKE 1 CAPSULE BY MOUTH ONCE EVERY 7 DAYS. Patient taking differently: Take 50,000 Units by mouth every 7 (seven) days. Takes on Sunday 06/11/22  Yes Stacks, Broadus [name], MD  Multiple Vitamins-Minerals (EYE VITAMINS & MINERALS PO) Take by mouth. [facility] Patient not taking: Reported on 10/14/2022    [provider]      Allergies    Patient has no known allergies.    Review of Systems   Review of Systems  Constitutional:  Positive for fatigue.  Respiratory:  Positive for shortness of breath.   Cardiovascular:  Positive for leg swelling.  All other systems reviewed and are negative.   Physical Exam Updated Vital Signs BP (!) 182/84 (BP Location: Left Arm)   Pulse 85   Temp (!) 97.5 F (36.4 C) (Oral)   Resp 17   Ht 5\' 9"  (1.753  m)   Wt 83 kg   SpO2 99%   BMI 27.02 kg/m  Physical Exam Vitals and nursing note reviewed.  Constitutional:      General: He is not in acute distress.    Appearance: He is well-developed. He is not ill-appearing, toxic-appearing or diaphoretic.  HENT:     Head: Normocephalic and atraumatic.     Mouth/Throat:     Mouth: Mucous membranes are moist.  Eyes:     Conjunctiva/sclera: Conjunctivae normal.  Cardiovascular:     Rate and Rhythm: Normal rate and regular rhythm.     Heart sounds: No murmur heard. Pulmonary:     Effort: Pulmonary effort is normal. No tachypnea or respiratory distress.     Breath sounds: Examination of the right-lower field  reveals decreased breath sounds. Examination of the left-lower field reveals decreased breath sounds. Decreased breath sounds present. No wheezing or rhonchi.  Abdominal:     Palpations: Abdomen is soft.     Tenderness: There is no abdominal tenderness.  Musculoskeletal:        General: No swelling.     Cervical back: Normal range of motion and neck supple.     Right lower leg: Edema present.     Left lower leg: Edema present.  Skin:    General: Skin is warm and dry.     Coloration: Skin is not cyanotic or pale.  Neurological:     General: No focal deficit present.     Mental Status: He is alert and oriented to person, place, and time.  Psychiatric:        Mood and Affect: Mood normal.        Behavior: Behavior normal.     ED Results / Procedures / Treatments   Labs (all labs ordered are listed, but only abnormal results are displayed) Labs Reviewed  COMPREHENSIVE METABOLIC PANEL - Abnormal; Notable for the following components:      Result Value   Glucose, Bld 145 (*)    BUN 27 (*)    Calcium 8.6 (*)    Total Protein 5.7 (*)    Albumin 2.5 (*)    GFR, Estimated 54 (*)    All other components within normal limits  CBC WITH DIFFERENTIAL/PLATELET - Abnormal; Notable for the following components:   RBC 3.20 (*)    Hemoglobin 12.0 (*)    HCT 35.7 (*)    MCV 111.6 (*)    MCH 37.5 (*)    RDW 15.8 (*)    Platelets 121 (*)    All other components within normal limits  BRAIN NATRIURETIC PEPTIDE - Abnormal; Notable for the following components:   B Natriuretic Peptide 1,031.0 (*)    All other components within normal limits  TROPONIN I (HIGH SENSITIVITY) - Abnormal; Notable for the following components:   Troponin I (High Sensitivity) 157 (*)    All other components within normal limits  TROPONIN I (HIGH SENSITIVITY) - Abnormal; Notable for the following components:   Troponin I (High Sensitivity) 138 (*)    All other components within normal limits  SARS CORONAVIRUS 2 BY RT  PCR  MAGNESIUM    EKG EKG Interpretation Date/Time:  Monday January 27 2023 09:52:59 EDT Ventricular Rate:  92 PR Interval:    QRS Duration:  120 QT Interval:  378 QTC Calculation: 467 R Axis:   -54  Text Interpretation: Atrial fibrillation Left anterior fascicular block Left ventricular hypertrophy with QRS widening and repolarization abnormality ( R in aVL ,  Cornell product ) Confirmed by Gloris Manchester [phone]) on 01/27/2023 10:58:15 AM  Radiology DG Chest 2 View  Result Date: 01/27/2023 CLINICAL DATA:  Provided history: Shortness of breath.  Shingles. EXAM: CHEST - 2 VIEW COMPARISON:  Prior chest radiographs 12/08/2022 and earlier. Chest CT 09/24/2022. FINDINGS: Cardiomegaly. A known aneurysm of the ascending thoracic aorta was better appreciated on the prior chest CT of 09/24/2022. Aortic atherosclerosis. Mild ill-defined and bandlike opacities at the lung bases. Trace bilateral pleural effusions. No evidence of pneumothorax. No acute osseous abnormality identified. Spondylosis and dextrocurvature of the thoracic spine. IMPRESSION: 1. Mild ill-defined and bandlike opacities at the lung bases. While these findings may reflect atelectasis, pneumonia cannot be excluded. Radiographic follow-up to resolution recommended. 2. Trace bilateral pleural effusions. 3. Cardiomegaly. 4. Known ascending thoracic aortic aneurysm better appreciated on the prior chest CT of 09/24/2022. 5. Aortic Atherosclerosis (ICD10-I70.0). Electronically Signed   By: Jackey Loge D.O.   On: 01/27/2023 12:04    Procedures Procedures    Medications Ordered in ED Medications  traMADol (ULTRAM) tablet 50 mg (has no administration in time range)  omega-3 acid ethyl esters (LOVAZA) capsule 2 g (has no administration in time range)  sodium chloride flush (NS) 0.9 % injection 3 mL (3 mLs Intravenous Given 01/27/23 1517)  sodium chloride flush (NS) 0.9 % injection 3 mL (has no administration in time range)  0.9 %  sodium  chloride infusion (has no administration in time range)  acetaminophen (TYLENOL) tablet 650 mg (has no administration in time range)  ondansetron (ZOFRAN) injection 4 mg (has no administration in time range)  heparin injection 5,000 Units (has no administration in time range)  furosemide (LASIX) injection 40 mg (has no administration in time range)  traZODone (DESYREL) tablet 25 mg (has no administration in time range)  potassium chloride SA (KLOR-CON M) CR tablet 40 mEq (40 mEq Oral Given 01/27/23 1110)  furosemide (LASIX) injection 80 mg (80 mg Intravenous Given 01/27/23 1107)    ED Course/ Medical Decision Making/ A&P                                 Medical Decision Making Amount and/or Complexity of Data Reviewed Labs: ordered. Radiology: ordered.  Risk Prescription drug management. Decision regarding hospitalization.   This patient presents to the ED for concern of shortness of breath, this involves an extensive number of treatment options, and is a complaint that carries with it a high risk of complications and morbidity.  The differential diagnosis includes CHF exacerbation, failure of outpatient diuretics, pneumonia, deconditioning, anemia, acidosis   Co morbidities that complicate the patient evaluation  HTN, atrial fibrillation, CHF, CKD, HLD   Additional history obtained:  Additional history obtained from patient's son External records from outside source obtained and reviewed including EMR   Lab Tests:  I Ordered, and personally interpreted labs.  The pertinent results include: Baseline hemoglobin, no leukocytosis, baseline creatinine, normal electrolytes.  Troponin and BNP are elevated consistent with CHF exacerbation.  Repeat troponin was stable.   Imaging Studies ordered:  I ordered imaging studies including chest x-ray I independently visualized and interpreted imaging which showed atelectasis, trace pleural effusions, cardiomegaly I agree with the  radiologist interpretation   Cardiac Monitoring: / EKG:  The patient was maintained on a cardiac monitor.  I personally viewed and interpreted the cardiac monitored which showed an underlying rhythm of: Atrial fibrillation  Problem List / ED Course /  Critical interventions / Medication management  Patient presenting for worsening shortness of breath and leg swelling over the past several days.  His son describes orthopnea at nighttime and exercise intolerance.  He is approximately 5 pounds up from his normal weight.  This is despite adherence to home torsemide.  On arrival in the ED, patient is overall well-appearing.  SpO2 is currently normal on room air.  His work of breathing at rest is minimal.  He does have significant bilateral lower extremity swelling.  On lung auscultation, he does have diminished breath sounds bibasilarly.  No wheezing is appreciated.  Patient's initial potassium was low-normal.  Will give potassium supplement in anticipation of IV diuretics.  Lasix was ordered.  His initial troponin was 157.  He denies any chest pain.  I suspect this is secondary to his CHF exacerbation.  Will continue to trend.  Repeat troponin was stable.  BNP was markedly elevated.  Lab work consistent with CHF.  Patient was admitted for further management. I ordered medication including potassium chloride for electrolyte optimization; Lasix for diuresis Reevaluation of the patient after these medicines showed that the patient improved I have reviewed the patients home medicines and have made adjustments as needed   Social Determinants of Health:  Lives at home with some        Final Clinical Impression(s) / ED Diagnoses Final diagnoses:  Acute on chronic congestive heart failure, unspecified heart failure type (HCC)  Failure of outpatient treatment    Rx / DC Orders ED Discharge Orders     None         Gloris Manchester, MD 01/27/23 [id number]

## 2023-01-27 NOTE — H&P (Signed)
History and Physical  [facility]  Brett Blankenship:[id number] DOB: [birth year] DOA: 01/27/2023  PCP: Mechele Claude, MD  Patient coming from: Home  Level of care: Telemetry  I have personally briefly reviewed patient's old medical records in [facility]  Chief Complaint: SOB   HPI: Brett Blankenship is a 89 year old gentleman with diastolic heart failure, atrial fibrillation (dx 01/24), recovered from bout of herpes zoster earlier this year, history of severe eccentric aortic valve regurgitation, history of subdural hematoma after a fall and subsequently taken off apixaban May 2024, ascending aorta dilatation (4.5 cm), former smoker, HTN, Hyperlipidemia, known cystic lesion in the pancreas (measuring up to 21 mm), recent bouts of acute HF exacerbations this year presents with shortness of breath.  He has been followed closely by cardiology this year.  He declined further work up for his valvular disease and deemed not a candidate for surgical intervention due to advanced age.  He had been changed to torsemide 40 mg twice daily in June 2024 for better diuresis and reports compliance with taking medication.   He says that for the past couple of days he has had progressive edema in both legs and feet.  He feels tired and he had to sleep in the recliner last night because of shortness of breath.  He denies chest pain.  He is easily fatigued.  He denies palpitations.  He reports that his weight is about 5 pounds higher than normal.  In the ED he was evaluated and noted to have a BNP >1000.  He had clinical exam findings of heart failure.  He was given IV furosemide 80 mg and admission was requested for further management of acute heart failure.      Past Medical History:  Diagnosis Date   Atrial fibrillation (HCC)    C7 cervical fracture (HCC)    CHF (congestive heart failure) (HCC)    HYPERTENSION 07/12/2009   Leaky heart valve     Past Surgical History:  Procedure Laterality  Date   APPENDECTOMY  1960   NECK SURGERY       reports that he quit smoking about 59 years ago. His smoking use included cigarettes. He started smoking about 64 years ago. He has a 1.5 pack-year smoking history. He has never used smokeless tobacco. He reports that he does not currently use alcohol. He reports that he does not currently use drugs.  No Known Allergies  Family History  Problem Relation Age of Onset   Hypertension Father    Heart disease Father    Cancer Brother        colon, lung    Prior to Admission medications   Medication Sig Start Date End Date Taking? Authorizing Provider  acetaminophen (TYLENOL) 500 MG tablet Take 2 tablets (1,000 mg total) by mouth 3 (three) times daily. For pain 12/25/22  Yes Stacks, Broadus [name], MD  fish oil-omega-3 fatty acids 1000 MG capsule Take 2 g by mouth daily.   Yes [provider]  nabumetone (RELAFEN) 500 MG tablet TAKE 2 TABLETS BY MOUTH TWICE DAILY AS NEEDED FOR PAIN. Patient taking differently: Take 1,000 mg by mouth 2 (two) times daily as needed for moderate pain. TAKE 2 TABLETS BY MOUTH TWICE DAILY AS NEEDED FOR PAIN. 12/04/22  Yes Stacks, Broadus [name], MD  nitroGLYCERIN (NITROSTAT) 0.4 MG SL tablet Place 1 tablet (0.4 mg total) under the tongue every 5 (five) minutes x 3 doses as needed for chest pain (if no relief after 3rd dose,  proceed to ED or call 911). 09/30/22  Yes Mallipeddi, Vishnu P, MD  OVER THE COUNTER MEDICATION Take 1 tablet by mouth daily. Vitamin B12   Yes [provider]  OVER THE COUNTER MEDICATION Take 1 tablet by mouth daily. Eye vitamin   Yes [provider]  potassium chloride SA (KLOR-CON M) 20 MEQ tablet Take 2 tablets (40 mEq total) by mouth every morning AND 1 tablet (20 mEq total) every evening. 10/16/22  Yes Mallipeddi, Vishnu P, MD  torsemide (DEMADEX) 20 MG tablet Take 2 tablets (40 mg total) by mouth 2 (two) times daily. 12/27/22  Yes Mallipeddi, Vishnu P, MD  traMADol (ULTRAM) 50 MG tablet  Take 1 tablet (50 mg total) by mouth 4 (four) times daily as needed for moderate pain. 12/25/22  Yes Mechele Claude, MD  Vitamin D, Ergocalciferol, (DRISDOL) 1.25 MG (50000 UNIT) CAPS capsule TAKE 1 CAPSULE BY MOUTH ONCE EVERY 7 DAYS. Patient taking differently: Take 50,000 Units by mouth every 7 (seven) days. Takes on Sunday 06/11/22  Yes Stacks, Broadus [name], MD  Multiple Vitamins-Minerals (EYE VITAMINS & MINERALS PO) Take by mouth. [facility] Patient not taking: Reported on 10/14/2022    [provider]    Physical Exam: Vitals:   01/27/23 0944 01/27/23 0944 01/27/23 1353 01/27/23 1423  BP:  (!) 151/80 (!) 152/83 (!) 182/84  Pulse:  95 82 85  Resp:  18 18 17   Temp:  98 F (36.7 C) 98.2 F (36.8 C) (!) 97.5 F (36.4 C)  TempSrc:  Oral Oral Oral  SpO2:  98% 94% 99%  Weight: 83 kg     Height: 5\' 9"  (1.753 m)      Constitutional: elderly male, awake, cooperative, sitting up in bed, moderately distressed, cooperative.  Eyes: PERRL, lids and conjunctivae normal ENMT: Mucous membranes are pale, moist. Posterior pharynx clear of any exudate or lesions.  Neck: normal, supple, no masses, no thyromegaly Respiratory: clear to auscultation bilaterally, no wheezing, no crackles. Normal respiratory effort. No accessory muscle use.  Cardiovascular: normal s1, s2 sounds, holosystolic murmurs heard, mild JVD seen. 3+ pitting bilateral lower extremity edema (symmetrical).  weak pedal pulses (due to edema). No carotid bruits.  Abdomen: no tenderness, no masses palpated. No hepatosplenomegaly. Bowel sounds positive.  Musculoskeletal: 3+ pitting edema BLEs, no clubbing / cyanosis. Age related joint deformities, upper and lower extremities. Good ROM, no contractures. Normal muscle tone.  Skin: no rashes, lesions, ulcers. No induration Neurologic: CN 2-12 grossly intact. Sensation intact, DTR normal. Strength 5/5 in all 4.  Psychiatric: Normal judgment and insight. Alert and oriented x 3. Normal mood.    Labs on Admission: I have personally reviewed following labs and imaging studies  CBC: Recent Labs  Lab 01/27/23 1006  WBC 4.9  NEUTROABS 3.5  HGB 12.0*  HCT 35.7*  MCV 111.6*  PLT 121*   Basic Metabolic Panel: Recent Labs  Lab 01/27/23 1006  NA 139  K 3.6  CL 102  CO2 26  GLUCOSE 145*  BUN 27*  CREATININE 1.23  CALCIUM 8.6*  MG 1.9   GFR: Estimated Creatinine Clearance: 36.7 mL/min (by C-G formula based on SCr of 1.23 mg/dL). Liver Function Tests: Recent Labs  Lab 01/27/23 1006  AST 26  ALT 19  ALKPHOS 57  BILITOT 1.1  PROT 5.7*  ALBUMIN 2.5*   No results for input(s): "LIPASE", "AMYLASE" in the last 168 hours. No results for input(s): "AMMONIA" in the last 168 hours. Coagulation Profile: No results for input(s): "INR", "PROTIME"  in the last 168 hours. Cardiac Enzymes: No results for input(s): "CKTOTAL", "CKMB", "CKMBINDEX", "TROPONINI" in the last 168 hours. BNP (last 3 results) No results for input(s): "PROBNP" in the last 8760 hours. HbA1C: No results for input(s): "HGBA1C" in the last 72 hours. CBG: No results for input(s): "GLUCAP" in the last 168 hours. Lipid Profile: No results for input(s): "CHOL", "HDL", "LDLCALC", "TRIG", "CHOLHDL", "LDLDIRECT" in the last 72 hours. Thyroid Function Tests: No results for input(s): "TSH", "T4TOTAL", "FREET4", "T3FREE", "THYROIDAB" in the last 72 hours. Anemia Panel: No results for input(s): "VITAMINB12", "FOLATE", "FERRITIN", "TIBC", "IRON", "RETICCTPCT" in the last 72 hours. Urine analysis:    Component Value Date/Time   COLORURINE YELLOW 09/05/2022 2025   APPEARANCEUR CLOUDY (A) 09/05/2022 2025   LABSPEC 1.015 09/05/2022 2025   PHURINE 5.0 09/05/2022 2025   GLUCOSEU NEGATIVE 09/05/2022 2025   HGBUR NEGATIVE 09/05/2022 2025   BILIRUBINUR NEGATIVE 09/05/2022 2025   KETONESUR NEGATIVE 09/05/2022 2025   PROTEINUR 30 (A) 09/05/2022 2025   NITRITE NEGATIVE 09/05/2022 2025   LEUKOCYTESUR LARGE (A)  09/05/2022 2025    Radiological Exams on Admission: DG Chest 2 View  Result Date: 01/27/2023 CLINICAL DATA:  Provided history: Shortness of breath.  Shingles. EXAM: CHEST - 2 VIEW COMPARISON:  Prior chest radiographs 12/08/2022 and earlier. Chest CT 09/24/2022. FINDINGS: Cardiomegaly. A known aneurysm of the ascending thoracic aorta was better appreciated on the prior chest CT of 09/24/2022. Aortic atherosclerosis. Mild ill-defined and bandlike opacities at the lung bases. Trace bilateral pleural effusions. No evidence of pneumothorax. No acute osseous abnormality identified. Spondylosis and dextrocurvature of the thoracic spine. IMPRESSION: 1. Mild ill-defined and bandlike opacities at the lung bases. While these findings may reflect atelectasis, pneumonia cannot be excluded. Radiographic follow-up to resolution recommended. 2. Trace bilateral pleural effusions. 3. Cardiomegaly. 4. Known ascending thoracic aortic aneurysm better appreciated on the prior chest CT of 09/24/2022. 5. Aortic Atherosclerosis (ICD10-I70.0). Electronically Signed   By: Jackey Loge D.O.   On: 01/27/2023 12:04    EKG: Independently reviewed. Agree with atrial fibrillation   Assessment/Plan Principal Problem:   Acute heart failure (HCC) Active Problems:   Essential hypertension   Systolic murmur   Mixed hyperlipidemia   Macrocytic anemia   Stage 3b chronic kidney disease (CKD) (HCC)   A-fib (HCC)   Aortic regurgitation   Dilatation of aorta (HCC)   Chronic diastolic heart failure (HCC)   Acute HFpEF - recent exacerbations secondary to significant aortic valvular disease  - pt reports in the past he has responded well to IV furosemide treatment in the hospital  - he was loaded with 80 mg IV furosemide in ED - plan IV furosemide 40 mg BID with potassium supplement - monitor electrolytes closely - low sodium diet ordered - check ReDs Vest reading - monitor intake/output and daily weights  - follow BNP  - most  recent 2D echo Jan 2024: LVEF 60-65%, severely dilated LA, eccentric aortic valve regurgitation likely severe and chronic worsened from 2018.  - pt and son requested Dr Jenene Slicker to see in hospital, will ask inpt cardiology team to see on 10/1  Stage 3b CKD - monitoring renal function closely while on IV furosemide diuresis  Persistent Atrial Fibrillation - per cardiology outpatient notes, rate controlling agents held due to possible severe AI  - apixaban was discontinued in May 2024 after fall and subdural hematoma - pt reportedly not a watchman candidate due to patient refusal and severe AI  Severe aortic valve regurgitation and  aortic stenosis - reportedly not a surgical candidate due to advanced age and his refusal for invasive procedures including TEE.  - continue IV furosemide for diuresis  Macrocytic anemia and thrombocytopenia  - continue daily folic acid and B12   Cystic pancreatic lesions - pt did not pursue further workup for this given advanced age  Essential hypertension  - resume home medication - diuresing with IV furosemide as noted  Recently recovered herpes zoster outbreak - lesions fully healed  - completed all the treatments   DVT prophylaxis: SQ heparin   Code Status: Full   Family Communication: son at bedside 9/30  Disposition Plan: anticipate home with [facility]  Consults called: cardiology for 10/1   Admission status: INP  Level of care: Telemetry Standley Dakins MD [facility] How to contact the [facility] Attending or Consulting provider 7A - 7P or covering provider during after hours 7P -7A, for this patient?  Check the care team in [facility] and look for a) attending/consulting [facility] provider listed and b) the [facility] team listed Log into [email or web address] and use [facility] universal password to access. If you do not have the password, please contact the hospital operator. Locate the [facility] provider you are looking for under [facility] and page to a number  that you can be directly reached. If you still have difficulty reaching the provider, please page the [facility] (Director on Call) for the Hospitalists listed on amion for assistance.   If 7PM-7AM, please contact night-coverage [email or web address] Password [email or web address]  01/27/2023, 3:00 PM

## 2023-01-28 ENCOUNTER — Inpatient Hospital Stay (HOSPITAL_COMMUNITY): Payer: Medicare Other

## 2023-01-28 ENCOUNTER — Encounter (HOSPITAL_COMMUNITY): Payer: Self-pay | Admitting: Family Medicine

## 2023-01-28 DIAGNOSIS — I4821 Permanent atrial fibrillation: Secondary | ICD-10-CM

## 2023-01-28 DIAGNOSIS — I5033 Acute on chronic diastolic (congestive) heart failure: Secondary | ICD-10-CM

## 2023-01-28 DIAGNOSIS — I5031 Acute diastolic (congestive) heart failure: Secondary | ICD-10-CM | POA: Diagnosis not present

## 2023-01-28 DIAGNOSIS — I351 Nonrheumatic aortic (valve) insufficiency: Secondary | ICD-10-CM | POA: Diagnosis not present

## 2023-01-28 DIAGNOSIS — I5032 Chronic diastolic (congestive) heart failure: Secondary | ICD-10-CM | POA: Diagnosis not present

## 2023-01-28 LAB — BASIC METABOLIC PANEL
Anion gap: 7 (ref 5–15)
BUN: 24 mg/dL — ABNORMAL HIGH (ref 8–23)
CO2: 28 mmol/L (ref 22–32)
Calcium: 8.2 mg/dL — ABNORMAL LOW (ref 8.9–10.3)
Chloride: 102 mmol/L (ref 98–111)
Creatinine, Ser: 1.08 mg/dL (ref 0.61–1.24)
GFR, Estimated: >60 mL/min (ref >60)
Glucose, Bld: 80 mg/dL (ref 70–99)
Potassium: 3 mmol/L — ABNORMAL LOW (ref 3.5–5.1)
Sodium: 137 mmol/L (ref 135–145)

## 2023-01-28 LAB — BRAIN NATRIURETIC PEPTIDE: B Natriuretic Peptide: 1220 pg/mL — ABNORMAL HIGH (ref 0.0–100.0)

## 2023-01-28 LAB — MAGNESIUM: Magnesium: 1.8 mg/dL (ref 1.7–2.4)

## 2023-01-28 MED ORDER — MAGNESIUM SULFATE 2 GM/50ML IV SOLN
2.0000 g | Freq: Once | INTRAVENOUS | Status: AC
  Administered 2023-01-28: 2 g via INTRAVENOUS
  Filled 2023-01-28: qty 50

## 2023-01-28 MED ORDER — METOPROLOL TARTRATE 25 MG PO TABS: 12.5000 mg | ORAL_TABLET | Freq: Two times a day (BID) | ORAL | Status: DC

## 2023-01-28 MED ORDER — METOPROLOL SUCCINATE ER 25 MG PO TB24
12.5000 mg | ORAL_TABLET | Freq: Every day | ORAL | Status: DC
  Administered 2023-01-28 – 2023-02-03 (×7): 12.5 mg via ORAL
  Filled 2023-01-28 (×7): qty 1

## 2023-01-28 MED ORDER — POTASSIUM CHLORIDE CRYS ER 20 MEQ PO TBCR
40.0000 meq | EXTENDED_RELEASE_TABLET | Freq: Two times a day (BID) | ORAL | Status: DC
  Administered 2023-01-28 – 2023-02-03 (×13): 40 meq via ORAL
  Filled 2023-01-28 (×13): qty 2

## 2023-01-28 NOTE — Progress Notes (Signed)
PROGRESS NOTE   Brett Blankenship  ZOX:[id number] DOB: 1926/09/06 DOA: 01/27/2023 PCP: Mechele Claude, MD   Chief Complaint  Patient presents with   Shortness of Breath   Level of care: Telemetry  Brief Admission History:  89 year old gentleman with diastolic heart failure, atrial fibrillation (dx 01/24), recovered from bout of herpes zoster earlier this year, history of severe eccentric aortic valve regurgitation, history of subdural hematoma after a fall and subsequently taken off apixaban May 2024, ascending aorta dilatation (49 mm), former smoker, HTN, Hyperlipidemia, known cystic lesion in the pancreas (measuring up to 21 mm), recent bouts of acute HF exacerbations this year presents with shortness of breath.  He has been followed closely by cardiology this year.  He declined further work up for his valvular disease and deemed not a candidate for surgical intervention due to advanced age.  He had been changed to torsemide 40 mg twice daily in June 2024 for better diuresis and reports compliance with taking medication.   He says that for the past couple of days he has had progressive edema in both legs and feet.  He feels tired and he had to sleep in the recliner last night because of shortness of breath.  He denies chest pain.  He is easily fatigued.  He denies palpitations.  He reports that his weight is about 5 pounds higher than normal.  In the ED he was evaluated and noted to have a BNP >1000.  He had clinical exam findings of heart failure.  He was given IV furosemide 80 mg and admission was requested for further management of acute heart failure.     Assessment and Plan:  Acute HFpEF - recent exacerbations secondary to significant aortic valvular disease  - pt reports in the past he has responded well to IV furosemide treatment in the hospital  - he was loaded with 80 mg IV furosemide in ED - continue IV furosemide 40 mg BID with potassium supplement - monitor electrolytes  closely - low sodium diet ordered - check ReDs Vest reading - monitor intake/output and daily weights  - follow BNP  - most recent 2D echo Jan 2024: LVEF 60-65%, severely dilated LA, eccentric aortic valve regurgitation likely severe and chronic worsened from 2018.  - pt and son requested Dr Jenene Slicker to see in hospital, will ask inpt cardiology team to see on 10/1   Intake/Output Summary (Last 24 hours) at 01/28/2023 1609 Last data filed at 01/28/2023 1222 Gross per 24 hour  Intake 240 ml  Output 2550 ml  Net -2310 ml   Filed Weights   01/27/23 0944 01/27/23 1423 01/28/23 0458  Weight: 83 kg 82.9 kg 81.3 kg     Stage 3b CKD - monitoring renal function closely while on IV furosemide diuresis   Persistent Atrial Fibrillation - toprol XL 12.5 mg daily for HR control  - apixaban was discontinued in May 2024 after fall and subdural hematoma - pt reportedly not a watchman candidate due to patient refusal and severe AI   Severe aortic valve regurgitation and aortic stenosis - reportedly not a surgical candidate due to advanced age and his refusal for invasive procedures including TEE.  - continue IV furosemide for diuresis   Macrocytic anemia and thrombocytopenia  - continue daily folic acid and B12    Cystic pancreatic lesions - pt did not pursue further workup for this given advanced age   Essential hypertension  - resume home medication - diuresing with IV furosemide as noted -  toprol XL 12.5 mg added 10/1 by heartcare team   Recently recovered herpes zoster outbreak - lesions fully healed  - completed all the treatments   DVT prophylaxis: SQ heparin  Code Status:  Full  Family Communication: son at bedside 9/30 Disposition: TBD    Consultants:  Cardiology  Procedures:   Antimicrobials:    Subjective: Pt was able to sit up in a chair and reports he has been urinating pretty often since arrival.   Objective: Vitals:   01/27/23 1423 01/27/23 2254 01/28/23  0458 01/28/23 0816  BP: (!) 182/84 (!) 159/62 (!) 156/77 137/82  Pulse: 85 93 84 89  Resp: 17 20 18 17   Temp: (!) 97.5 F (36.4 C) 98 F (36.7 C) 98.6 F (37 C) 98.2 F (36.8 C)  TempSrc: Oral   Oral  SpO2: 99% 96% 98% 98%  Weight: 82.9 kg  81.3 kg   Height: 5\' 9"  (1.753 m)       Intake/Output Summary (Last 24 hours) at 01/28/2023 1605 Last data filed at 01/28/2023 1222 Gross per 24 hour  Intake 240 ml  Output 2550 ml  Net -2310 ml   Filed Weights   01/27/23 0944 01/27/23 1423 01/28/23 0458  Weight: 83 kg 82.9 kg 81.3 kg   Examination:  General exam: Appears calm and comfortable  Respiratory system: Clear to auscultation. Respiratory effort normal. Cardiovascular system: normal S1 & S2 heard. mild JVD. 2+ pitting pedal edema. Gastrointestinal system: Abdomen is nondistended, soft and nontender. No organomegaly or masses felt. Normal bowel sounds heard. Central nervous system: Alert and oriented. No focal neurological deficits. Extremities: 2++ pitting edema BLEs, up to knees Symmetric 5 x 5 power. Skin: chronic venous stasis changes, no lesions or ulcers. Psychiatry: Judgement and insight appear normal. Mood & affect appropriate.   Data Reviewed: I have personally reviewed following labs and imaging studies  CBC: Recent Labs  Lab 01/27/23 1006  WBC 4.9  NEUTROABS 3.5  HGB 12.0*  HCT 35.7*  MCV 111.6*  PLT 121*    Basic Metabolic Panel: Recent Labs  Lab 01/27/23 1006 01/28/23 0432  NA 139 137  K 3.6 3.0*  CL 102 102  CO2 26 28  GLUCOSE 145* 80  BUN 27* 24*  CREATININE 1.23 1.08  CALCIUM 8.6* 8.2*  MG 1.9 1.8    CBG: No results for input(s): "GLUCAP" in the last 168 hours.  Recent Results (from the past 240 hour(s))  SARS Coronavirus 2 by RT PCR (hospital order, performed in [facility] hospital lab) *cepheid single result test* Anterior Nasal Swab     Status: None   Collection Time: 01/27/23 10:05 AM   Specimen: Anterior Nasal Swab  Result Value  Ref Range Status   SARS Coronavirus 2 by RT PCR NEGATIVE NEGATIVE Final    Comment: (NOTE) SARS-CoV-2 target nucleic acids are NOT DETECTED.  The SARS-CoV-2 RNA is generally detectable in upper and lower respiratory specimens during the acute phase of infection. The lowest concentration of SARS-CoV-2 viral copies this assay can detect is 250 copies / mL. A negative result does not preclude SARS-CoV-2 infection and should not be used as the sole basis for treatment or other patient management decisions.  A negative result may occur with improper specimen collection / handling, submission of specimen other than nasopharyngeal swab, presence of viral mutation(s) within the areas targeted by this assay, and inadequate number of viral copies (<250 copies / mL). A negative result must be combined with clinical observations, patient history,  and epidemiological information.  Fact Sheet for Patients:   RoadLapTop.co.za  Fact Sheet for Healthcare Providers: http://kim-miller.com/  This test is not yet approved or  cleared by the [place] FDA and has been authorized for detection and/or diagnosis of SARS-CoV-2 by FDA under an Emergency Use Authorization (EUA).  This EUA will remain in effect (meaning this test can be used) for the duration of the COVID-19 declaration under Section 564(b)(1) of the Act, 21 U.S.C. section 360bbb-3(b)(1), unless the authorization is terminated or revoked sooner.  Performed at [facility], [address], [place], [place] [address]      Radiology Studies: [facility] Chest [facility] 1 View  Result Date: 01/28/2023 CLINICAL DATA:  Acute heart failure. EXAM: PORTABLE CHEST 1 VIEW COMPARISON:  January 27, 2023. FINDINGS: Stable cardiomediastinal silhouette. Increased bibasilar opacities are noted concerning for worsening atelectasis or infiltrates. Bony thorax is unremarkable. IMPRESSION: Increased bibasilar opacities are  noted concerning for worsening atelectasis or infiltrates. Electronically Signed   By: Lupita Raider M.D.   On: 01/28/2023 11:00   DG Chest 2 View  Result Date: 01/27/2023 CLINICAL DATA:  Provided history: Shortness of breath.  Shingles. EXAM: CHEST - 2 VIEW COMPARISON:  Prior chest radiographs 12/08/2022 and earlier. Chest CT 09/24/2022. FINDINGS: Cardiomegaly. A known aneurysm of the ascending thoracic aorta was better appreciated on the prior chest CT of 09/24/2022. Aortic atherosclerosis. Mild ill-defined and bandlike opacities at the lung bases. Trace bilateral pleural effusions. No evidence of pneumothorax. No acute osseous abnormality identified. Spondylosis and dextrocurvature of the thoracic spine. IMPRESSION: 1. Mild ill-defined and bandlike opacities at the lung bases. While these findings may reflect atelectasis, pneumonia cannot be excluded. Radiographic follow-up to resolution recommended. 2. Trace bilateral pleural effusions. 3. Cardiomegaly. 4. Known ascending thoracic aortic aneurysm better appreciated on the prior chest CT of 09/24/2022. 5. Aortic Atherosclerosis (ICD10-I70.0). Electronically Signed   By: Jackey Loge D.O.   On: 01/27/2023 12:04    Scheduled Meds:  furosemide  40 mg Intravenous Q12H   heparin  5,000 Units Subcutaneous Q8H   metoprolol succinate  12.5 mg Oral Daily   omega-3 acid ethyl esters  2 g Oral Daily   potassium chloride SA  40 mEq Oral BID   sodium chloride flush  3 mL Intravenous Q12H   Continuous Infusions:  sodium chloride      LOS: 1 day   Time spent: 50 mins  Donyae Kohn Laural Benes, MD How to contact the [facility] Attending or Consulting provider 7A - 7P or covering provider during after hours 7P -7A, for this patient?  Check the care team in [facility] and look for a) attending/consulting TRH provider listed and b) the [facility] team listed Log into [email or web address] to find provider on call.  Locate the [facility] provider you are looking for under [facility] and page  to a number that you can be directly reached. If you still have difficulty reaching the provider, please page the [facility] (Director on Call) for the Hospitalists listed on amion for assistance.  01/28/2023, 4:05 PM

## 2023-01-28 NOTE — Progress Notes (Signed)
Mobility Specialist Progress Note:    01/28/23 1400  Mobility  Activity Ambulated with assistance in hallway  Level of Assistance Contact guard assist, steadying assist  Assistive Device Front wheel walker  Distance Ambulated (ft) 70 ft  Range of Motion/Exercises Active;All extremities  Activity Response Tolerated well  Mobility Referral Yes  $Mobility charge 1 Mobility  Mobility Specialist Start Time (ACUTE ONLY) 1400  Mobility Specialist Stop Time (ACUTE ONLY) 1415  Mobility Specialist Time Calculation (min) (ACUTE ONLY) 15 min   Pt received in chair, agreeable to mobility. Required CGA to stand and ambulate with RW. Tolerated well, asx throughout. Returned pt to room, left in chair. Alarm on, call bell in reach, all needs met.   Lawerance Bach Mobility Specialist Please contact via Special educational needs teacher or  Rehab office at [phone]

## 2023-01-28 NOTE — TOC Initial Note (Addendum)
Transition of Care [facility]) - Initial/Assessment Note    Patient Details  Name: Brett Blankenship MRN: [id number] Date of Birth: 09/05/1926  Transition of Care [facility]) CM/SW Contact:    Villa Herb, LCSWA Phone Number: 01/28/2023, 12:06 PM  Clinical Narrative:                 Pt is high risk for readmission. CSW spoke with pts son to complete assessment. Pt lives with his son. Pt is independent in completing his ADLs. Pts son provides transport when needed. Pt has used [facility] in the past and they are agreeable to this being set back up if needed. Pt has a cane, rollator and traditional walker to use when needed.   Pts son states pt weighs himself daily. Pt follows a heart healthy diet. Pt takes medications as prescribed. TOC to follow.   Expected Discharge Plan: Home w Home Health Services Barriers to Discharge: Continued Medical Work up   Patient Goals and CMS Choice Patient states their goals for this hospitalization and ongoing recovery are:: return home CMS Medicare.gov Compare Post Acute Care list provided to:: Patient Choice offered to / list presented to : Patient      Expected Discharge Plan and Services In-house Referral: Clinical Social Work Discharge Planning Services: CM Consult Post Acute Care Choice: Home Health                                        Prior Living Arrangements/Services   Lives with:: Adult Children Patient language and need for interpreter reviewed:: Yes Do you feel safe going back to the place where you live?: Yes      Need for Family Participation in Patient Care: Yes (Comment) Care giver support system in place?: Yes (comment) Current home services: DME Criminal Activity/Legal Involvement Pertinent to Current Situation/Hospitalization: No - Comment as needed  Activities of Daily Living   ADL Screening (condition at time of admission) Does the patient have a NEW difficulty with bathing/dressing/toileting/self-feeding that is  expected to last >3 days?: No Does the patient have a NEW difficulty with getting in/out of bed, walking, or climbing stairs that is expected to last >3 days?: No Does the patient have a NEW difficulty with communication that is expected to last >3 days?: No Is the patient deaf or have difficulty hearing?: Yes Does the patient have difficulty seeing, even when wearing glasses/contacts?: No Does the patient have difficulty concentrating, remembering, or making decisions?: No  Permission Sought/Granted                  Emotional Assessment Appearance:: Appears stated age Attitude/Demeanor/Rapport: Engaged Affect (typically observed): Accepting   Alcohol / Substance Use: Not Applicable Psych Involvement: No (comment)  Admission diagnosis:  Acute heart failure (HCC) [I50.9] Patient Active Problem List   Diagnosis Date Noted   Acute heart failure (HCC) 01/27/2023   Closed displaced fracture of seventh cervical vertebra (HCC) 10/02/2022   Pleural effusion, bilateral 09/30/2022   Chronic diastolic heart failure (HCC) 09/15/2022   Pancreatic mass 09/06/2022   UTI (urinary tract infection) 09/06/2022   Hypokalemia 09/06/2022   Acute metabolic encephalopathy 09/05/2022   Aortic regurgitation 06/17/2022   Dilatation of aorta (HCC) 06/17/2022   Acute CHF (congestive heart failure) (HCC) 05/28/2022   Elevated troponin 05/27/2022   Macrocytic anemia 05/27/2022   Stage 3b chronic kidney disease (CKD) (HCC) 05/27/2022  Acute exacerbation of CHF (congestive heart failure) (HCC) 05/27/2022   A-fib (HCC) 05/27/2022   Mixed hyperlipidemia 09/04/2021   Vitamin D deficiency 09/04/2021   Systolic murmur 12/01/2016   Bradycardia 12/01/2016   Chronic pain of left knee 10/10/2016   Essential hypertension 07/12/2009   PCP:  Mechele Claude, MD Pharmacy:   [facility] [place], [place] - [address] [place] [address] Phone: [phone] Fax:  [phone]     Social Determinants of Health (SDOH) Social History: SDOH Screenings   Food Insecurity: No Food Insecurity (01/27/2023)  Housing: Patient Declined (01/27/2023)  Transportation Needs: No Transportation Needs (01/27/2023)  Utilities: Not At Risk (01/27/2023)  Alcohol Screen: Low Risk  (03/01/2022)  Depression (PHQ2-9): Low Risk  (12/06/2022)  Financial Resource Strain: Low Risk  (03/01/2022)  Physical Activity: Sufficiently Active (03/01/2022)  Social Connections: Moderately Isolated (03/01/2022)  Stress: No Stress Concern Present (03/01/2022)  Tobacco Use: Medium Risk (01/27/2023)   SDOH Interventions:     Readmission Risk Interventions    01/28/2023   12:05 PM  Readmission Risk Prevention Plan  Transportation Screening Complete  HRI or Home Care Consult Complete  Social Work Consult for Recovery Care Planning/Counseling Complete  Palliative Care Screening Not Applicable  Medication Review Oceanographer) Complete

## 2023-01-28 NOTE — Consult Note (Addendum)
Cardiology Consultation   Patient ID: Brett Blankenship MRN: [id number]; DOB: 01-18-1927  Admit date: 01/27/2023 Date of Consult: 01/28/2023  PCP:  Mechele Claude, MD   [facility] Providers Cardiologist:  Marjo Bicker, MD        Patient Profile:   Brett Blankenship is a 89 y.o. male with a hx of HTN, HLD, Stage 3 CKD, severe aortic valve regurgitation, ascending aorta dilatation (at 49 mm by echo in 04/2022), persistent atrial fibrillation and chronic HFpEF who is being seen 01/28/2023 for the evaluation of CHF and aortic valve disease at the request of Dr. Laural Benes.  History of Present Illness:   Mr. Haxton was examined by Dr. Jenene Slicker in 09/2022 and had recently undergone thoracentesis following recent CTA results. He was taking Torsemide 40 mg twice daily and was continued on this. He was not felt to be a surgical candidate for his aortic valve regurgitation given his advanced age and he previously declined any procedures including a TEE as well. He was also not on anticoagulation due to prior ICH.  He presented to [facility] ED on 01/27/2023 for evaluation of worsening shortness of breath and fluid retention for the past several days. In talking with the patient today, he reports having worsening lower extremity edema for the past several weeks. Says that his son manages his medications but he has been taking these consistently. Says his weight averages around 180 - 182 lbs (was at 179 lbs at the time of his office visit in 09/2022). He reports episodes of orthopnea and PND for the past 3 to 4 days which prompted him to seek evaluation. Denies any shortness of breath with activity but says he is not overly active at baseline. No recent chest pain or palpitations. Says that he has been adding salt to his food and he typically consumes takeout from local restaurants several days a week. Says that he has been under more stress as his son is undergoing bypass surgery in the  coming weeks.  Initial labs showed WBC 4.9, Hgb 12.0, platelets 121, Na+ 139, K+ 3.6 and creatinine 1.23 (close to baseline).  Albumin 2.5. AST 26 and ALT 19. BNP elevated to 1031. Initial and repeat Hs Troponin elevated at 157 and 138. CXR showed mild, ill-defined bandlike opacities at the lung bases which may reflect atelectasis or pneumonia. Was noted to have trace bilateral pleural effusions. EKG showed rate-controlled atrial fibrillation, HR 92 with LAFB and LVH.  He received 80 mg IV Lasix while in the ED followed by an additional 40mg  then starting 40mg  BID today.  Recorded net output of -2.6 L thus far. Repeat labs this morning show creatinine has improved to 1.08 but K+ is low at 3.0. Supplementation already ordered by the admitting team.    Past Medical History:  Diagnosis Date   Aortic regurgitation    Atrial fibrillation (HCC)    C7 cervical fracture (HCC)    CHF (congestive heart failure) (HCC)    HYPERTENSION 07/12/2009    Past Surgical History:  Procedure Laterality Date   APPENDECTOMY  1960   NECK SURGERY       Home Medications:  Prior to Admission medications   Medication Sig Start Date End Date Taking? Authorizing Provider  acetaminophen (TYLENOL) 500 MG tablet Take 2 tablets (1,000 mg total) by mouth 3 (three) times daily. For pain 12/25/22  Yes Stacks, Broadus [name], MD  fish oil-omega-3 fatty acids 1000 MG capsule Take 2 g by mouth daily.  Yes [provider]  nabumetone (RELAFEN) 500 MG tablet TAKE 2 TABLETS BY MOUTH TWICE DAILY AS NEEDED FOR PAIN. Patient taking differently: Take 1,000 mg by mouth 2 (two) times daily as needed for moderate pain. TAKE 2 TABLETS BY MOUTH TWICE DAILY AS NEEDED FOR PAIN. 12/04/22  Yes Stacks, Broadus [name], MD  nitroGLYCERIN (NITROSTAT) 0.4 MG SL tablet Place 1 tablet (0.4 mg total) under the tongue every 5 (five) minutes x 3 doses as needed for chest pain (if no relief after 3rd dose, proceed to ED or call 911). 09/30/22  Yes Mallipeddi,  Vishnu P, MD  OVER THE COUNTER MEDICATION Take 1 tablet by mouth daily. Vitamin B12   Yes [provider]  OVER THE COUNTER MEDICATION Take 1 tablet by mouth daily. Eye vitamin   Yes [provider]  potassium chloride SA (KLOR-CON M) 20 MEQ tablet Take 2 tablets (40 mEq total) by mouth every morning AND 1 tablet (20 mEq total) every evening. 10/16/22  Yes Mallipeddi, Vishnu P, MD  torsemide (DEMADEX) 20 MG tablet Take 2 tablets (40 mg total) by mouth 2 (two) times daily. 12/27/22  Yes Mallipeddi, Vishnu P, MD  traMADol (ULTRAM) 50 MG tablet Take 1 tablet (50 mg total) by mouth 4 (four) times daily as needed for moderate pain. 12/25/22  Yes Mechele Claude, MD  Vitamin D, Ergocalciferol, (DRISDOL) 1.25 MG (50000 UNIT) CAPS capsule TAKE 1 CAPSULE BY MOUTH ONCE EVERY 7 DAYS. Patient taking differently: Take 50,000 Units by mouth every 7 (seven) days. Takes on Sunday 06/11/22  Yes Stacks, Broadus [name], MD  Multiple Vitamins-Minerals (EYE VITAMINS & MINERALS PO) Take by mouth. [facility] Patient not taking: Reported on 10/14/2022    [provider]    Inpatient Medications: Scheduled Meds:  furosemide  40 mg Intravenous Q12H   heparin  5,000 Units Subcutaneous Q8H   omega-3 acid ethyl esters  2 g Oral Daily   potassium chloride SA  40 mEq Oral BID   sodium chloride flush  3 mL Intravenous Q12H   Continuous Infusions:  sodium chloride     magnesium sulfate bolus IVPB 2 g (01/28/23 0826)   PRN Meds: sodium chloride, acetaminophen, ondansetron (ZOFRAN) IV, sodium chloride flush, traMADol, traZODone  Allergies:   No Known Allergies  Social History:   Social History   Tobacco Use   Smoking status: Former    Current packs/day: 0.00    Average packs/day: 0.3 packs/day for 5.0 years (1.5 ttl pk-yrs)    Types: Cigarettes    Start date: 09/12/1958    Quit date: 09/12/1963    Years since quitting: 59.4   Smokeless tobacco: Never  Substance Use Topics   Alcohol use: Not  Currently     Family History:    Family History  Problem Relation Age of Onset   Hypertension Father    Heart disease Father    Cancer Brother        colon, lung     ROS:  Please see the history of present illness.  All other ROS reviewed and negative.     Physical Exam/Data:   Vitals:   01/27/23 1423 01/27/23 2254 01/28/23 0458 01/28/23 0816  BP: (!) 182/84 (!) 159/62 (!) 156/77 137/82  Pulse: 85 93 84 89  Resp: 17 20 18 17   Temp: (!) 97.5 F (36.4 C) 98 F (36.7 C) 98.6 F (37 C) 98.2 F (36.8 C)  TempSrc: Oral   Oral  SpO2: 99% 96% 98% 98%  Weight: 82.9 kg  81.3 kg   Height: 5\' 9"  (1.753 m)       Intake/Output Summary (Last 24 hours) at 01/28/2023 0906 Last data filed at 01/28/2023 0300 Gross per 24 hour  Intake 240 ml  Output 2890 ml  Net -2650 ml      01/28/2023    4:58 AM 01/27/2023    2:23 PM 01/27/2023    9:44 AM  Last 3 Weights  Weight (lbs) 179 lb 3.2 oz 182 lb 12.2 oz 183 lb  Weight (kg) 81.285 kg 82.9 kg 83.008 kg     Body mass index is 26.46 kg/m.  General: Pleasant, elderly male appearing in no acute distress. HEENT: normal Neck: JVD elevated at 10-11 cm.  Vascular: No carotid bruits; Distal pulses 2+ bilaterally Cardiac:  normal S1, S2; Irregularly irregular; Significant diastolic murmur along RUSB with systolic murmur along present. Lungs: Rales along bases bilaterally.  Abd: soft, nontender, no hepatomegaly  Ext: 2+ pitting edema up to knees bilaterally.  Musculoskeletal:  No deformities, BUE and BLE strength normal and equal Skin: warm and dry  Neuro:  CNs 2-12 intact, no focal abnormalities noted Psych:  Normal affect   EKG:  The EKG was personally reviewed and demonstrates: Rate-controlled atrial fibrillation, HR 92 with LAFB and LVH. Telemetry:  Telemetry was personally reviewed and demonstrates:  Atrial fibrillation, HR in 80's to 90's, peaking into 120's at times.   Relevant CV Studies:  Echocardiogram: 05/28/2022 IMPRESSIONS     1. The aortic valve was not well visualized. There is an eccentric aortic  valve regurgitation jet impinging along the ventricular surface of  anterior mitral valve leaflet resulting in thickening/restricted mobility  of anterior mitral valve leaflet with  mild mitral valve regurgitation consistent with chronic, probably severe  aortic valve regurgitation. Mild aortic valve stenosis. Consider TEE for  further evaluation of aortic valve.   2. Aortic dilatation noted. There is mild dilatation of the aortic root,  measuring 43 mm. There is moderate dilatation of the ascending aorta,  measuring 49 mm.   3. Left ventricular ejection fraction, by estimation, is 60 to 65%. The  left ventricle has normal function. Left ventricular endocardial border  not optimally defined to evaluate regional wall motion. There is moderate  asymmetric left ventricular  hypertrophy of the septal and basal segments. Left ventricular diastolic  function could not be evaluated.   4. Right ventricular systolic function was not well visualized. The right  ventricular size is normal.   5. Left atrial size was severely dilated.   6. The mitral valve is abnormal. Mild mitral valve regurgitation. No  evidence of mitral stenosis. Moderate mitral annular calcification.   Laboratory Data:  High Sensitivity Troponin:   Recent Labs  Lab 01/27/23 1006 01/27/23 1203  TROPONINIHS 157* 138*     Chemistry Recent Labs  Lab 01/27/23 1006 01/28/23 0432  NA 139 137  K 3.6 3.0*  CL 102 102  CO2 26 28  GLUCOSE 145* 80  BUN 27* 24*  CREATININE 1.23 1.08  CALCIUM 8.6* 8.2*  MG 1.9 1.8  GFRNONAA 54* >60  ANIONGAP 11 7    Recent Labs  Lab 01/27/23 1006  PROT 5.7*  ALBUMIN 2.5*  AST 26  ALT 19  ALKPHOS 57  BILITOT 1.1    Hematology Recent Labs  Lab 01/27/23 1006  WBC 4.9  RBC 3.20*  HGB 12.0*  HCT 35.7*  MCV 111.6*  MCH 37.5*  MCHC 33.6  RDW 15.8*  PLT 121*  BNP Recent Labs  Lab  01/27/23 1006 01/28/23 0432  BNP 1,031.0* 1,220.0*     Radiology/Studies:  DG Chest 2 View  Result Date: 01/27/2023 CLINICAL DATA:  Provided history: Shortness of breath.  Shingles. EXAM: CHEST - 2 VIEW COMPARISON:  Prior chest radiographs 12/08/2022 and earlier. Chest CT 09/24/2022. FINDINGS: Cardiomegaly. A known aneurysm of the ascending thoracic aorta was better appreciated on the prior chest CT of 09/24/2022. Aortic atherosclerosis. Mild ill-defined and bandlike opacities at the lung bases. Trace bilateral pleural effusions. No evidence of pneumothorax. No acute osseous abnormality identified. Spondylosis and dextrocurvature of the thoracic spine. IMPRESSION: 1. Mild ill-defined and bandlike opacities at the lung bases. While these findings may reflect atelectasis, pneumonia cannot be excluded. Radiographic follow-up to resolution recommended. 2. Trace bilateral pleural effusions. 3. Cardiomegaly. 4. Known ascending thoracic aortic aneurysm better appreciated on the prior chest CT of 09/24/2022. 5. Aortic Atherosclerosis (ICD10-I70.0). Electronically Signed   By: Jackey Loge D.O.   On: 01/27/2023 12:04     Assessment and Plan:   1. Acute HFpEF - Suspect his acute exacerbation is triggered by dietary indiscretion (has been consuming takeout and adding salt to most meals), elevated HR due to atrial fibrillation with RVR and his known valvular heart disease. - BNP elevated at 1031 and CXR consistent with CHF. He has already received 120 mg of IV Lasix with a recorded net output of -2.6 L thus far. He appears significantly volume overloaded by examination today. - At this time, he has lost IV access. Once this is restored, would continue with IV Lasix 40 mg twice daily (did receive morning dose prior to losing IV access). Could consider the addition of an SGLT2 inhibitor prior to discharge but likely not an ideal candidate given his variable PO intake and nutrition status (Albumin 2.5). Not much  utility in obtaining a repeat echo at this time given plans for conservative management.   2.  Persistent atrial fibrillation - He denies any recent palpitations and his heart rate has been variable from the 90's to 120's by review of telemetry. It appears that he had previously been on Lopressor but this was stopped due to severe aortic valve regurgitation. Anticipate we will likely need to resume rate-controlling agents as uncontrolled atrial fibrillation could be contributing to his volume overload. - He was previously on Eliquis for anticoagulation but this was discontinued in 08/2022 after he suffered an ICH in the setting of a fall.  3. Aortic Valve Regurgitation - Echocardiogram in 04/2022 showed severe aortic valve regurgitation along with mild dilatation of the aortic root at 43 mm and moderate dilatation of the ascending aorta at 49 mm (read as 4.5 cm by CTA in 08/2022). He was previously not interested in surgical interventions given his age and we reviewed this again today and he is not interested in aggressive workup. Agree with this given his advanced age. Would ultimately benefit from Palliative Care consult as he is Full Code. Continue with medical management.  4. Stage III CKD - Baseline creatinine 1.0 - 1.1. At 1.23 on admission improving to 1.08 today with diuresis.  5. Hypokalemia - K+ at 3.0 today and supplementation has already been ordered by the admitting team.   Risk Assessment/Risk Scores:        New York Heart Association (NYHA) Functional Class NYHA Class IV  CHA2DS2-VASc Score = 4   This indicates a 4.8% annual risk of stroke. The patient's score is based upon: CHF History: 0 HTN History:  1 Diabetes History: 0 Stroke History: 0 Vascular Disease History: 1 Age Score: 2 Gender Score: 0    For questions or updates, please contact [facility] Please consult [email or web address] for contact info under    Signed, Ellsworth Lennox, PA-C  01/28/2023  9:06 AM    Attending note:  Patient seen and examined.  I reviewed his records and last echocardiogram, case discussed with Ms. Iran Ouch PA-C and I agree with her above findings.  Mr. Garavaglia presents with worsening shortness of breath, orthopnea, and fluid overload in the setting of known valvular heart disease as well as increased sodium intake recently.  He reports compliance with his medications.  Aortic regurgitation described as severe by echocardiogram earlier in the year, very eccentric and hard to quantify however.  He prefers conservative management, no TEE.  On examination this morning, states that he feels better.  He did diurese net of 2600 cc so far with IV Lasix.  He is afebrile, heart rate 80s to low 100s in atrial fibrillation by telemetry.  Systolic blood pressure 130s to 150s.  Lungs are clear.  Cardiac exam with irregularly irregular rhythm, 2/6 systolic murmur, very soft diastolic murmur, no gallop.  2+ leg edema noted.  Pertinent lab work includes potassium 3.6 down to 3.0 with diuresis, creatinine 1.08, BNP 1220, high-sensitivity troponin I levels in the 130s to 150s, hemoglobin 12, platelets 121.  Chest x-ray reports trace bilateral pleural effusions with cardiomegaly and probable atelectasis, also aortic atherosclerosis and aneurysmal enlargement of the thoracic aorta.  ECG from September 30 shows atrial fibrillation with LVH and left anterior fascicular block.  HFpEF in the setting of known aortic regurgitation which is being managed conservatively.  Plan to continue Lasix at 40 mg IV twice daily, supplement potassium, also add low-dose Toprol-XL to achieve better heart rate control of atrial fibrillation (do not want him significantly bradycardic however with aortic regurgitation and increased diastolic time).  Jonelle Sidle, M.D., F.A.C.C.

## 2023-01-28 NOTE — Plan of Care (Signed)
  Problem: Clinical Measurements: Goal: Ability to maintain clinical measurements within normal limits will improve Outcome: Progressing   Problem: Activity: Goal: Risk for activity intolerance will decrease Outcome: Progressing   Problem: Nutrition: Goal: Adequate nutrition will be maintained Outcome: Progressing   Problem: Pain Managment: Goal: General experience of comfort will improve Outcome: Progressing   Problem: Safety: Goal: Ability to remain free from injury will improve Outcome: Progressing

## 2023-01-29 DIAGNOSIS — I5031 Acute diastolic (congestive) heart failure: Secondary | ICD-10-CM | POA: Diagnosis not present

## 2023-01-29 DIAGNOSIS — I5033 Acute on chronic diastolic (congestive) heart failure: Secondary | ICD-10-CM | POA: Diagnosis not present

## 2023-01-29 LAB — BASIC METABOLIC PANEL
Anion gap: 8 (ref 5–15)
BUN: 22 mg/dL (ref 8–23)
CO2: 27 mmol/L (ref 22–32)
Calcium: 8.3 mg/dL — ABNORMAL LOW (ref 8.9–10.3)
Chloride: 101 mmol/L (ref 98–111)
Creatinine, Ser: 0.97 mg/dL (ref 0.61–1.24)
GFR, Estimated: >60 mL/min (ref >60)
Glucose, Bld: 81 mg/dL (ref 70–99)
Potassium: 3.5 mmol/L (ref 3.5–5.1)
Sodium: 136 mmol/L (ref 135–145)

## 2023-01-29 LAB — MAGNESIUM: Magnesium: 2 mg/dL (ref 1.7–2.4)

## 2023-01-29 LAB — BRAIN NATRIURETIC PEPTIDE: B Natriuretic Peptide: 1204 pg/mL — ABNORMAL HIGH (ref 0.0–100.0)

## 2023-01-29 NOTE — Progress Notes (Signed)
Mobility Specialist Progress Note:    01/29/23 1000  Mobility  Activity Ambulated with assistance in hallway  Level of Assistance Minimal assist, patient does 75% or more  Assistive Device Front wheel walker  Distance Ambulated (ft) 70 ft  Range of Motion/Exercises Active;All extremities  Activity Response Tolerated well  Mobility Referral Yes  $Mobility charge 1 Mobility  Mobility Specialist Start Time (ACUTE ONLY) 0950  Mobility Specialist Stop Time (ACUTE ONLY) 1005  Mobility Specialist Time Calculation (min) (ACUTE ONLY) 15 min   Pt received sitting EOB, son in room. Agreeable to mobility, required MinA to stand and CGA to ambulate with RW. Tolerated well, asx throughout. Returned pt to room, left in chair. Chair alarm on, call bell in reach. All needs met.   Lawerance Bach Mobility Specialist Please contact via Special educational needs teacher or  Rehab office at [phone]

## 2023-01-29 NOTE — Progress Notes (Signed)
Mobility Specialist Progress Note:    01/29/23 1430  Mobility  Activity Ambulated with assistance in hallway  Level of Assistance Contact guard assist, steadying assist  Assistive Device Front wheel walker  Distance Ambulated (ft) 70 ft  Range of Motion/Exercises Active;All extremities  Activity Response Tolerated well  Mobility Referral Yes  $Mobility charge 1 Mobility  Mobility Specialist Start Time (ACUTE ONLY) 1415  Mobility Specialist Stop Time (ACUTE ONLY) 1430  Mobility Specialist Time Calculation (min) (ACUTE ONLY) 15 min   Pt received in chair, agreeable to mobility. Required CGA to stand and ambulate with RW. Tolerated well, asx throughout. Returned pt to chair, alarm on. Call bell in reach, all needs met.  Lawerance Bach Mobility Specialist Please contact via Special educational needs teacher or  Rehab office at [phone]

## 2023-01-29 NOTE — Progress Notes (Signed)
   01/29/23 2031  Assess: MEWS Score  Temp (!) 101.7 F (38.7 C)  BP (!) 180/91  MAP (mmHg) 115  Pulse Rate 94  Resp 18  SpO2 95 %  O2 Device Room Air  Assess: MEWS Score  MEWS Temp 2  MEWS Systolic 0  MEWS Pulse 0  MEWS RR 0  MEWS LOC 0  MEWS Score 2  MEWS Score Color Yellow  Assess: SIRS CRITERIA  SIRS Temperature  1  SIRS Pulse 1  SIRS Respirations  0  SIRS WBC 0  SIRS Score Sum  2   Patient vitals rechecked and corrected. BP 145/64 and temp 99.6. Will continue to monitor

## 2023-01-29 NOTE — TOC Progression Note (Signed)
Transition of Care [facility]) - Progression Note    Patient Details  Name: Brett Blankenship MRN: [id number] Date of Birth: 1926/07/09  [facility]) CM/SW Contact  Mohsen Odenthal [place]is [place], [place] Phone Number: 01/29/2023, 2:31 PM  Clinical Narrative:     CSW is notified by RN that pt's son is requesting a hospital bed. CSW called pt's son and confirmed need for bed. Pt lives at address listed in chart with his son. Son has no preference on DME company. CSW ordered hospital bed with [employer].    Expected Discharge Plan: Home w Home Health Services Barriers to Discharge: Continued Medical Work up  Expected Discharge Plan and Services In-house Referral: Clinical Social Work Discharge Planning Services: CM Consult Post Acute Care Choice: Home Health                                         Social Determinants of Health (SDOH) Interventions SDOH Screenings   Food Insecurity: No Food Insecurity (01/27/2023)  Housing: Patient Declined (01/27/2023)  Transportation Needs: No Transportation Needs (01/27/2023)  Utilities: Not At Risk (01/27/2023)  Alcohol Screen: Low Risk  (03/01/2022)  Depression (PHQ2-9): Low Risk  (12/06/2022)  Financial Resource Strain: Low Risk  (03/01/2022)  Physical Activity: Sufficiently Active (03/01/2022)  Social Connections: Moderately Isolated (03/01/2022)  Stress: No Stress Concern Present (03/01/2022)  Tobacco Use: Medium Risk (01/27/2023)    Readmission Risk Interventions    01/28/2023   12:05 PM  Readmission Risk Prevention Plan  Transportation Screening Complete  HRI or Home Care Consult Complete  Social Work Consult for Recovery Care Planning/Counseling Complete  Palliative Care Screening Not Applicable  Medication Review Oceanographer) Complete

## 2023-01-29 NOTE — Progress Notes (Signed)
Patient requires frequent re-positioning of the body in ways that cannot be achieved with an ordinary bed or wedge pillow, to eliminate pain, reduce pressure, and the head of the bed to be elevated more than 30 degrees most of the time due to Acute HFpEF

## 2023-01-29 NOTE — Plan of Care (Signed)

## 2023-01-29 NOTE — Progress Notes (Signed)
Progress Note  Patient Name: Brett Blankenship Date of Encounter: 01/29/2023  Primary Cardiologist: Marjo Bicker, MD  Interval Summary   Eating breakfast.  I spoke with his son in the room as well this morning.  Patient reports no shortness of breath at rest, no chest pain.  Leg swelling gradually improving.  Vital Signs    Vitals:   01/28/23 0816 01/28/23 2229 01/29/23 0500 01/29/23 0617  BP: 137/82 (!) 155/68  (!) 148/74  Pulse: 89 87  84  Resp: 17 18  18   Temp: 98.2 F (36.8 C) 97.9 F (36.6 C)  97.7 F (36.5 C)  TempSrc: Oral     SpO2: 98% 97%  94%  Weight:   81.7 kg   Height:        Intake/Output Summary (Last 24 hours) at 01/29/2023 0915 Last data filed at 01/29/2023 0813 Gross per 24 hour  Intake 240 ml  Output 1995 ml  Net -1755 ml   Filed Weights   01/27/23 1423 01/28/23 0458 01/29/23 0500  Weight: 82.9 kg 81.3 kg 81.7 kg    Physical Exam   GEN: No acute distress.   Neck: No JVD. Cardiac: Irregularly irregular, 2/6 systolic murmur, no gallop.  Respiratory: Nonlabored. Clear to auscultation bilaterally. GI: Soft, nontender, bowel sounds present. MS: 2-3+ lower leg edema. Neuro:  Nonfocal. Psych: Alert and oriented x 3. Normal affect.  ECG/Telemetry    Telemetry shows rate controlled atrial fibrillation.  Labs    Chemistry Recent Labs  Lab 01/27/23 1006 01/28/23 0432 01/29/23 0443  NA 139 137 136  K 3.6 3.0* 3.5  CL 102 102 101  CO2 26 28 27   GLUCOSE 145* 80 81  BUN 27* 24* 22  CREATININE 1.23 1.08 0.97  CALCIUM 8.6* 8.2* 8.3*  PROT 5.7*  --   --   ALBUMIN 2.5*  --   --   AST 26  --   --   ALT 19  --   --   ALKPHOS 57  --   --   BILITOT 1.1  --   --   GFRNONAA 54* >60 >60  ANIONGAP 11 7 8     Hematology Recent Labs  Lab 01/27/23 1006  WBC 4.9  RBC 3.20*  HGB 12.0*  HCT 35.7*  MCV 111.6*  MCH 37.5*  MCHC 33.6  RDW 15.8*  PLT 121*   Cardiac Enzymes Recent Labs  Lab 01/27/23 1006 01/27/23 1203  TROPONINIHS  157* 138*   Lipid Panel     Component Value Date/Time   CHOL 98 (L) 04/18/2022 1018   TRIG 121 04/18/2022 1018   HDL 26 (L) 04/18/2022 1018   CHOLHDL 3.8 04/18/2022 1018   LDLCALC 50 04/18/2022 1018   LABVLDL 22 04/18/2022 1018    Cardiac Studies   Echocardiogram 05/28/2022:  1. The aortic valve was not well visualized. There is an eccentric aortic  valve regurgitation jet impinging along the ventricular surface of  anterior mitral valve leaflet resulting in thickening/restricted mobility  of anterior mitral valve leaflet with  mild mitral valve regurgitation consistent with chronic, probably severe  aortic valve regurgitation. Mild aortic valve stenosis. Consider TEE for  further evaluation of aortic valve.   2. Aortic dilatation noted. There is mild dilatation of the aortic root,  measuring 43 mm. There is moderate dilatation of the ascending aorta,  measuring 49 mm.   3. Left ventricular ejection fraction, by estimation, is 60 to 65%. The  left ventricle has normal function.  Left ventricular endocardial border  not optimally defined to evaluate regional wall motion. There is moderate  asymmetric left ventricular  hypertrophy of the septal and basal segments. Left ventricular diastolic  function could not be evaluated.   4. Right ventricular systolic function was not well visualized. The right  ventricular size is normal.   5. Left atrial size was severely dilated.   6. The mitral valve is abnormal. Mild mitral valve regurgitation. No  evidence of mitral stenosis. Moderate mitral annular calcification.   Assessment & Plan   1.  Acute on chronic HFpEF.  LVEF approximately 60 to 65% by echocardiogram earlier in the year.  Interstitial edema and trace pleural effusions by chest x-ray.  Responding well to IV Lasix with net diuresis of approximately 4100 cc last 48 hours.  Clinically improving but not at baseline.  2.  Eccentric aortic regurgitation, most likely moderate to  severe, difficult to quantify by echocardiogram from January.  Patient declines further workup with TEE and conservative approach adopted.  He does not have LV chamber dilatation.  3.  Persistent atrial fibrillation, CHA2DS2-VASc score of 4.  Low-dose beta-blocker added yesterday for more optimal heart rate control, he is tolerating this well without bradycardia.  4.  CKD stage IIIa-b, most recent creatinine down to 1.97 with diuresis.  Would continue IV diuresis for now, reassess urine output and renal function tomorrow.  Not ready for transition to oral diuretics as yet.  He was on Demadex 40 mg twice daily at home, likely needs higher dose and/or intermittent use of metolazone at discharge.  For questions or updates, please contact [facility] Please consult [email or web address] for contact info under   Signed, Nona Dell, MD  01/29/2023, 9:15 AM

## 2023-01-29 NOTE — Progress Notes (Signed)
PROGRESS NOTE    Brett Blankenship  UJW:[id number] DOB: Nov 24, 1926 DOA: 01/27/2023 PCP: Mechele Claude, MD   Brief Narrative:  89 year old gentleman with diastolic heart failure, atrial fibrillation (dx 01/24), recovered from bout of herpes zoster earlier this year, history of severe eccentric aortic valve regurgitation, history of subdural hematoma after a fall and subsequently taken off apixaban May 2024, ascending aorta dilatation (49 mm), former smoker, HTN, Hyperlipidemia, known cystic lesion in the pancreas (measuring up to 21 mm), recent bouts of acute HF exacerbations this year presents with shortness of breath.  He has been followed closely by cardiology this year.  He declined further work up for his valvular disease and deemed not a candidate for surgical intervention due to advanced age.  He had been changed to torsemide 40 mg twice daily in June 2024 for better diuresis and reports compliance with taking medication.   He says that for the past couple of days he has had progressive edema in both legs and feet.  He feels tired and he had to sleep in the recliner last night because of shortness of breath.  He denies chest pain.  He is easily fatigued.  He denies palpitations.  He reports that his weight is about 5 pounds higher than normal.  In the ED he was evaluated and noted to have a BNP >1000.  He had clinical exam findings of heart failure.  He was given IV furosemide 80 mg and admission was requested for further management of acute heart failure.      Assessment & Plan:   Principal Problem:   Acute heart failure (HCC) Active Problems:   A-fib (HCC)   Macrocytic anemia   Stage 3b chronic kidney disease (CKD) (HCC)   Essential hypertension   Mixed hyperlipidemia   Systolic murmur   Aortic regurgitation   Dilatation of aorta (HCC)   Chronic diastolic heart failure (HCC)  Assessment and Plan:   Acute HFpEF - recent exacerbations secondary to significant aortic valvular  disease  - pt reports in the past he has responded well to IV furosemide treatment in the hospital  - he was loaded with 80 mg IV furosemide in ED - continue IV furosemide 40 mg BID with potassium supplement - monitor electrolytes closely - low sodium diet ordered - check ReDs Vest reading - monitor intake/output and daily weights  - follow BNP  - most recent 2D echo Jan 2024: LVEF 60-65%, severely dilated LA, eccentric aortic valve regurgitation likely severe and chronic worsened from 2018.  -Appreciate ongoing cardiology recommendations     Stage 3b CKD - monitoring renal function closely while on IV furosemide diuresis   Persistent Atrial Fibrillation - toprol XL 12.5 mg daily for HR control  - apixaban was discontinued in May 2024 after fall and subdural hematoma - pt reportedly not a watchman candidate due to patient refusal and severe AI   Severe aortic valve regurgitation and aortic stenosis - reportedly not a surgical candidate due to advanced age and his refusal for invasive procedures including TEE.  - continue IV furosemide for diuresis   Macrocytic anemia and thrombocytopenia  - continue daily folic acid and B12    Cystic pancreatic lesions - pt did not pursue further workup for this given advanced age   Essential hypertension  - resume home medication - diuresing with IV furosemide as noted - toprol XL 12.5 mg added 10/1 by heartcare team   Recently recovered herpes zoster outbreak - lesions fully healed  -  completed all the treatments     DVT prophylaxis:Heparin Code Status: Full Family Communication: Son at bedside 10/2 Disposition Plan:  Status is: Inpatient Remains inpatient appropriate because: Need for IV medications.   Consultants:  Cardiology  Procedures:  None  Antimicrobials:  None   Subjective: Patient seen and evaluated today with no new acute complaints or concerns. No acute concerns or events noted overnight.  He continues to  diurese aggressively.  Objective: Vitals:   01/28/23 0816 01/28/23 2229 01/29/23 0500 01/29/23 0617  BP: 137/82 (!) 155/68  (!) 148/74  Pulse: 89 87  84  Resp: 17 18  18   Temp: 98.2 F (36.8 C) 97.9 F (36.6 C)  97.7 F (36.5 C)  TempSrc: Oral     SpO2: 98% 97%  94%  Weight:   81.7 kg   Height:        Intake/Output Summary (Last 24 hours) at 01/29/2023 1136 Last data filed at 01/29/2023 0900 Gross per 24 hour  Intake 480 ml  Output 1995 ml  Net -1515 ml   Filed Weights   01/27/23 1423 01/28/23 0458 01/29/23 0500  Weight: 82.9 kg 81.3 kg 81.7 kg    Examination:  General exam: Appears calm and comfortable  Respiratory system: Clear to auscultation. Respiratory effort normal. Cardiovascular system: S1 & S2 heard, RRR.  Systolic murmur. Gastrointestinal system: Abdomen is soft Central nervous system: Alert and awake Extremities: Bilateral 2+ lower extremity edema. Skin: No significant lesions noted Psychiatry: Flat affect.    Data Reviewed: I have personally reviewed following labs and imaging studies  CBC: Recent Labs  Lab 01/27/23 1006  WBC 4.9  NEUTROABS 3.5  HGB 12.0*  HCT 35.7*  MCV 111.6*  PLT 121*   Basic Metabolic Panel: Recent Labs  Lab 01/27/23 1006 01/28/23 0432 01/29/23 0443  NA 139 137 136  K 3.6 3.0* 3.5  CL 102 102 101  CO2 26 28 27   GLUCOSE 145* 80 81  BUN 27* 24* 22  CREATININE 1.23 1.08 0.97  CALCIUM 8.6* 8.2* 8.3*  MG 1.9 1.8 2.0   GFR: Estimated Creatinine Clearance: 46.6 mL/min (by C-G formula based on SCr of 0.97 mg/dL). Liver Function Tests: Recent Labs  Lab 01/27/23 1006  AST 26  ALT 19  ALKPHOS 57  BILITOT 1.1  PROT 5.7*  ALBUMIN 2.5*   No results for input(s): "LIPASE", "AMYLASE" in the last 168 hours. No results for input(s): "AMMONIA" in the last 168 hours. Coagulation Profile: No results for input(s): "INR", "PROTIME" in the last 168 hours. Cardiac Enzymes: No results for input(s): "CKTOTAL", "CKMB",  "CKMBINDEX", "TROPONINI" in the last 168 hours. BNP (last 3 results) No results for input(s): "PROBNP" in the last 8760 hours. HbA1C: No results for input(s): "HGBA1C" in the last 72 hours. CBG: No results for input(s): "GLUCAP" in the last 168 hours. Lipid Profile: No results for input(s): "CHOL", "HDL", "LDLCALC", "TRIG", "CHOLHDL", "LDLDIRECT" in the last 72 hours. Thyroid Function Tests: No results for input(s): "TSH", "T4TOTAL", "FREET4", "T3FREE", "THYROIDAB" in the last 72 hours. Anemia Panel: No results for input(s): "VITAMINB12", "FOLATE", "FERRITIN", "TIBC", "IRON", "RETICCTPCT" in the last 72 hours. Sepsis Labs: No results for input(s): "PROCALCITON", "LATICACIDVEN" in the last 168 hours.  Recent Results (from the past 240 hour(s))  SARS Coronavirus 2 by RT PCR (hospital order, performed in [facility] hospital lab) *cepheid single result test* Anterior Nasal Swab     Status: None   Collection Time: 01/27/23 10:05 AM   Specimen: Anterior Nasal  Swab  Result Value Ref Range Status   SARS Coronavirus 2 by RT PCR NEGATIVE NEGATIVE Final    Comment: (NOTE) SARS-CoV-2 target nucleic acids are NOT DETECTED.  The SARS-CoV-2 RNA is generally detectable in upper and lower respiratory specimens during the acute phase of infection. The lowest concentration of SARS-CoV-2 viral copies this assay can detect is 250 copies / mL. A negative result does not preclude SARS-CoV-2 infection and should not be used as the sole basis for treatment or other patient management decisions.  A negative result may occur with improper specimen collection / handling, submission of specimen other than nasopharyngeal swab, presence of viral mutation(s) within the areas targeted by this assay, and inadequate number of viral copies (<250 copies / mL). A negative result must be combined with clinical observations, patient history, and epidemiological information.  Fact Sheet for Patients:    RoadLapTop.co.za  Fact Sheet for Healthcare Providers: http://kim-miller.com/  This test is not yet approved or  cleared by the [place] FDA and has been authorized for detection and/or diagnosis of SARS-CoV-2 by FDA under an Emergency Use Authorization (EUA).  This EUA will remain in effect (meaning this test can be used) for the duration of the COVID-19 declaration under Section 564(b)(1) of the Act, 21 U.S.C. section 360bbb-3(b)(1), unless the authorization is terminated or revoked sooner.  Performed at [facility], [address], [place], [place] [address]          Radiology Studies: [facility] 1 View  Result Date: 01/28/2023 CLINICAL DATA:  Acute heart failure. EXAM: PORTABLE CHEST 1 VIEW COMPARISON:  January 27, 2023. FINDINGS: Stable cardiomediastinal silhouette. Increased bibasilar opacities are noted concerning for worsening atelectasis or infiltrates. Bony thorax is unremarkable. IMPRESSION: Increased bibasilar opacities are noted concerning for worsening atelectasis or infiltrates. Electronically Signed   By: Lupita Raider M.D.   On: 01/28/2023 11:00        Scheduled Meds:  furosemide  40 mg Intravenous Q12H   heparin  5,000 Units Subcutaneous Q8H   metoprolol succinate  12.5 mg Oral Daily   omega-3 acid ethyl esters  2 g Oral Daily   potassium chloride SA  40 mEq Oral BID   sodium chloride flush  3 mL Intravenous Q12H   Continuous Infusions:  sodium chloride       LOS: 2 days    Time spent: 35 minutes    Hatsumi Steinhart Hoover Brunette, DO [facility]  If 7PM-7AM, please contact night-coverage [email or web address] 01/29/2023, 11:36 AM

## 2023-01-30 DIAGNOSIS — I5033 Acute on chronic diastolic (congestive) heart failure: Secondary | ICD-10-CM | POA: Diagnosis not present

## 2023-01-30 DIAGNOSIS — I5031 Acute diastolic (congestive) heart failure: Secondary | ICD-10-CM | POA: Diagnosis not present

## 2023-01-30 LAB — BASIC METABOLIC PANEL
Anion gap: 7 (ref 5–15)
BUN: 23 mg/dL (ref 8–23)
CO2: 26 mmol/L (ref 22–32)
Calcium: 8.3 mg/dL — ABNORMAL LOW (ref 8.9–10.3)
Chloride: 100 mmol/L (ref 98–111)
Creatinine, Ser: 0.98 mg/dL (ref 0.61–1.24)
GFR, Estimated: >60 mL/min (ref >60)
Glucose, Bld: 90 mg/dL (ref 70–99)
Potassium: 3.9 mmol/L (ref 3.5–5.1)
Sodium: 133 mmol/L — ABNORMAL LOW (ref 135–145)

## 2023-01-30 LAB — URINALYSIS, ROUTINE W REFLEX MICROSCOPIC
Bilirubin Urine: NEGATIVE
Glucose, UA: NEGATIVE mg/dL
Hgb urine dipstick: NEGATIVE
Ketones, ur: NEGATIVE mg/dL
Leukocytes,Ua: NEGATIVE
Nitrite: NEGATIVE
Protein, ur: NEGATIVE mg/dL
Specific Gravity, Urine: 1.006 (ref 1.005–1.030)
pH: 6 (ref 5.0–8.0)

## 2023-01-30 LAB — MAGNESIUM: Magnesium: 2 mg/dL (ref 1.7–2.4)

## 2023-01-30 MED ORDER — FUROSEMIDE 10 MG/ML IJ SOLN: 20.0000 mg | Freq: Once | INTRAMUSCULAR | Status: DC

## 2023-01-30 MED ORDER — FUROSEMIDE 10 MG/ML IJ SOLN
60.0000 mg | Freq: Two times a day (BID) | INTRAMUSCULAR | Status: DC
  Administered 2023-01-30 – 2023-02-02 (×8): 60 mg via INTRAVENOUS
  Filled 2023-01-30 (×8): qty 6

## 2023-01-30 MED ORDER — FUROSEMIDE 10 MG/ML IJ SOLN: 60.0000 mg | Freq: Two times a day (BID) | INTRAMUSCULAR | Status: DC

## 2023-01-30 MED ORDER — FLUTICASONE PROPIONATE 50 MCG/ACT NA SUSP
1.0000 | Freq: Every day | NASAL | Status: DC
  Administered 2023-01-30 – 2023-02-02 (×4): 1 via NASAL
  Filled 2023-01-30: qty 16

## 2023-01-30 MED ORDER — EMPAGLIFLOZIN 10 MG PO TABS: 10.0000 mg | ORAL_TABLET | Freq: Every day | ORAL | Status: DC

## 2023-01-30 NOTE — Plan of Care (Signed)
  Problem: Education: Goal: Knowledge of General Education information will improve Description: Including pain rating scale, medication(s)/side effects and non-pharmacologic comfort measures Outcome: Progressing   Problem: Health Behavior/Discharge Planning: Goal: Ability to manage health-related needs will improve Outcome: Progressing   Problem: Education: Goal: Ability to demonstrate management of disease process will improve Outcome: Progressing   

## 2023-01-30 NOTE — Progress Notes (Signed)
PROGRESS NOTE    Brett Blankenship  OHY:[id number] DOB: 1927/01/29 DOA: 01/27/2023 PCP: Mechele Claude, MD   Brief Narrative:  89 year old gentleman with diastolic heart failure, atrial fibrillation (dx 01/24), recovered from bout of herpes zoster earlier this year, history of severe eccentric aortic valve regurgitation, history of subdural hematoma after a fall and subsequently taken off apixaban May 2024, ascending aorta dilatation (49 mm), former smoker, HTN, Hyperlipidemia, known cystic lesion in the pancreas (measuring up to 21 mm), recent bouts of acute HF exacerbations this year presents with shortness of breath.  He has been followed closely by cardiology this year.  He declined further work up for his valvular disease and deemed not a candidate for surgical intervention due to advanced age.  He had been changed to torsemide 40 mg twice daily in June 2024 for better diuresis and reports compliance with taking medication.   He says that for the past couple of days he has had progressive edema in both legs and feet.  He feels tired and he had to sleep in the recliner last night because of shortness of breath.  He denies chest pain.  He is easily fatigued.  He denies palpitations.  He reports that his weight is about 5 pounds higher than normal.  In the ED he was evaluated and noted to have a BNP >1000.  He had clinical exam findings of heart failure.  He was given IV furosemide 80 mg and admission was requested for further management of acute heart failure.      Assessment & Plan:   Principal Problem:   Acute heart failure (HCC) Active Problems:   A-fib (HCC)   Macrocytic anemia   Stage 3b chronic kidney disease (CKD) (HCC)   Essential hypertension   Mixed hyperlipidemia   Systolic murmur   Aortic regurgitation   Dilatation of aorta (HCC)   Chronic diastolic heart failure (HCC)  Assessment and Plan:   Acute HFpEF - recent exacerbations secondary to significant aortic valvular  disease  - pt reports in the past he has responded well to IV furosemide treatment in the hospital  - monitor electrolytes closely - low sodium diet ordered - check ReDs Vest reading - monitor intake/output and daily weights  - follow BNP  - most recent 2D echo Jan 2024: LVEF 60-65%, severely dilated LA, eccentric aortic valve regurgitation likely severe and chronic worsened from 2018.  -Appreciate ongoing cardiology recommendations with plans to increase IV Lasix to 60 mg twice daily -Plan to check urine analysis prior to considering SGLT2 inhibitor     Stage 3b CKD - monitoring renal function closely while on IV furosemide diuresis   Persistent Atrial Fibrillation - toprol XL 12.5 mg daily for HR control  - apixaban was discontinued in May 2024 after fall and subdural hematoma - pt reportedly not a watchman candidate due to patient refusal and severe AI   Severe aortic valve regurgitation and aortic stenosis - reportedly not a surgical candidate due to advanced age and his refusal for invasive procedures including TEE.  - continue IV furosemide for diuresis   Macrocytic anemia and thrombocytopenia  - continue daily folic acid and B12    Cystic pancreatic lesions - pt did not pursue further workup for this given advanced age   Essential hypertension  - resume home medication - diuresing with IV furosemide as noted - toprol XL 12.5 mg added 10/1 by heartcare team   Recently recovered herpes zoster outbreak - lesions fully healed  -  completed all the treatments     DVT prophylaxis:Heparin Code Status: Full Family Communication: Son at bedside 10/2 Disposition Plan:  Status is: Inpatient Remains inpatient appropriate because: Need for IV medications.   Consultants:  Cardiology  Procedures:  None  Antimicrobials:  None   Subjective: Patient seen and evaluated today with no new acute complaints or concerns. No acute concerns or events noted overnight.  He  continues to diurese aggressively.  Objective: Vitals:   01/29/23 2031 01/29/23 2049 01/29/23 2104 01/30/23 0423  BP: (!) 180/91  (!) 145/64 115/69  Pulse: 94  86 71  Resp: 18 18 20 16   Temp: (!) 101.7 F (38.7 C) 99.6 F (37.6 C) 99.6 F (37.6 C) (!) 97.5 F (36.4 C)  TempSrc: Oral  Oral Oral  SpO2: 95%  93% 95%  Weight:    82.1 kg  Height:        Intake/Output Summary (Last 24 hours) at 01/30/2023 1109 Last data filed at 01/30/2023 0700 Gross per 24 hour  Intake 240 ml  Output 675 ml  Net -435 ml   Filed Weights   01/28/23 0458 01/29/23 0500 01/30/23 0423  Weight: 81.3 kg 81.7 kg 82.1 kg    Examination:  General exam: Appears calm and comfortable  Respiratory system: Clear to auscultation. Respiratory effort normal. Cardiovascular system: S1 & S2 heard, RRR.  Systolic murmur. Gastrointestinal system: Abdomen is soft Central nervous system: Alert and awake Extremities: Bilateral 2+ lower extremity edema. Skin: No significant lesions noted Psychiatry: Flat affect.    Data Reviewed: I have personally reviewed following labs and imaging studies  CBC: Recent Labs  Lab 01/27/23 1006  WBC 4.9  NEUTROABS 3.5  HGB 12.0*  HCT 35.7*  MCV 111.6*  PLT 121*   Basic Metabolic Panel: Recent Labs  Lab 01/27/23 1006 01/28/23 0432 01/29/23 0443 01/30/23 0414  NA 139 137 136 133*  K 3.6 3.0* 3.5 3.9  CL 102 102 101 100  CO2 26 28 27 26   GLUCOSE 145* 80 81 90  BUN 27* 24* 22 23  CREATININE 1.23 1.08 0.97 0.98  CALCIUM 8.6* 8.2* 8.3* 8.3*  MG 1.9 1.8 2.0 2.0   GFR: Estimated Creatinine Clearance: 46.1 mL/min (by C-G formula based on SCr of 0.98 mg/dL). Liver Function Tests: Recent Labs  Lab 01/27/23 1006  AST 26  ALT 19  ALKPHOS 57  BILITOT 1.1  PROT 5.7*  ALBUMIN 2.5*   No results for input(s): "LIPASE", "AMYLASE" in the last 168 hours. No results for input(s): "AMMONIA" in the last 168 hours. Coagulation Profile: No results for input(s): "INR",  "PROTIME" in the last 168 hours. Cardiac Enzymes: No results for input(s): "CKTOTAL", "CKMB", "CKMBINDEX", "TROPONINI" in the last 168 hours. BNP (last 3 results) No results for input(s): "PROBNP" in the last 8760 hours. HbA1C: No results for input(s): "HGBA1C" in the last 72 hours. CBG: No results for input(s): "GLUCAP" in the last 168 hours. Lipid Profile: No results for input(s): "CHOL", "HDL", "LDLCALC", "TRIG", "CHOLHDL", "LDLDIRECT" in the last 72 hours. Thyroid Function Tests: No results for input(s): "TSH", "T4TOTAL", "FREET4", "T3FREE", "THYROIDAB" in the last 72 hours. Anemia Panel: No results for input(s): "VITAMINB12", "FOLATE", "FERRITIN", "TIBC", "IRON", "RETICCTPCT" in the last 72 hours. Sepsis Labs: No results for input(s): "PROCALCITON", "LATICACIDVEN" in the last 168 hours.  Recent Results (from the past 240 hour(s))  SARS Coronavirus 2 by RT PCR (hospital order, performed in [facility] hospital lab) *cepheid single result test* Anterior Nasal Swab  Status: None   Collection Time: 01/27/23 10:05 AM   Specimen: Anterior Nasal Swab  Result Value Ref Range Status   SARS Coronavirus 2 by RT PCR NEGATIVE NEGATIVE Final    Comment: (NOTE) SARS-CoV-2 target nucleic acids are NOT DETECTED.  The SARS-CoV-2 RNA is generally detectable in upper and lower respiratory specimens during the acute phase of infection. The lowest concentration of SARS-CoV-2 viral copies this assay can detect is 250 copies / mL. A negative result does not preclude SARS-CoV-2 infection and should not be used as the sole basis for treatment or other patient management decisions.  A negative result may occur with improper specimen collection / handling, submission of specimen other than nasopharyngeal swab, presence of viral mutation(s) within the areas targeted by this assay, and inadequate number of viral copies (<250 copies / mL). A negative result must be combined with clinical observations,  patient history, and epidemiological information.  Fact Sheet for Patients:   RoadLapTop.co.za  Fact Sheet for Healthcare Providers: http://kim-miller.com/  This test is not yet approved or  cleared by the [place] FDA and has been authorized for detection and/or diagnosis of SARS-CoV-2 by FDA under an Emergency Use Authorization (EUA).  This EUA will remain in effect (meaning this test can be used) for the duration of the COVID-19 declaration under Section 564(b)(1) of the Act, 21 U.S.C. section 360bbb-3(b)(1), unless the authorization is terminated or revoked sooner.  Performed at [facility], [address] [place], [place] [address]          Radiology Studies: No results found.      Scheduled Meds:  fluticasone  1 spray Each Nare Daily   furosemide  60 mg Intravenous BID   heparin  5,000 Units Subcutaneous Q8H   metoprolol succinate  12.5 mg Oral Daily   omega-3 acid ethyl esters  2 g Oral Daily   potassium chloride SA  40 mEq Oral BID   sodium chloride flush  3 mL Intravenous Q12H   Continuous Infusions:  sodium chloride       LOS: 3 days    Time spent: 35 minutes    Lynsey Ange Hoover Brunette, DO [facility]  If 7PM-7AM, please contact night-coverage [email or web address] 01/30/2023, 11:09 AM

## 2023-01-30 NOTE — Plan of Care (Signed)
  Problem: Clinical Measurements: Goal: Respiratory complications will improve Outcome: Progressing Goal: Cardiovascular complication will be avoided Outcome: Progressing   Problem: Activity: Goal: Risk for activity intolerance will decrease Outcome: Progressing   Problem: Elimination: Goal: Will not experience complications related to urinary retention Outcome: Progressing   Problem: Pain Managment: Goal: General experience of comfort will improve Outcome: Progressing   

## 2023-01-30 NOTE — Progress Notes (Signed)
   01/30/23 1000  ReDS Vest / Clip  Ruler Value 35  ReDS Value Range < 36  ReDS Actual Value 26

## 2023-01-30 NOTE — Progress Notes (Signed)
Rounding Note    Patient Name: Brett Blankenship Date of Encounter: 01/30/2023  [facility] Cardiologist: Marjo Bicker, MD   Subjective   SOB improving but not resolved, some ongoint orthopnea  Inpatient Medications    Scheduled Meds:  furosemide  40 mg Intravenous Q12H   heparin  5,000 Units Subcutaneous Q8H   metoprolol succinate  12.5 mg Oral Daily   omega-3 acid ethyl esters  2 g Oral Daily   potassium chloride SA  40 mEq Oral BID   sodium chloride flush  3 mL Intravenous Q12H   Continuous Infusions:  sodium chloride     PRN Meds: sodium chloride, acetaminophen, ondansetron (ZOFRAN) IV, sodium chloride flush, traMADol, traZODone   Vital Signs    Vitals:   01/29/23 2031 01/29/23 2049 01/29/23 2104 01/30/23 0423  BP: (!) 180/91  (!) 145/64 115/69  Pulse: 94  86 71  Resp: 18 18 20 16   Temp: (!) 101.7 F (38.7 C) 99.6 F (37.6 C) 99.6 F (37.6 C) (!) 97.5 F (36.4 C)  TempSrc: Oral  Oral Oral  SpO2: 95%  93% 95%  Weight:    82.1 kg  Height:        Intake/Output Summary (Last 24 hours) at 01/30/2023 0905 Last data filed at 01/30/2023 0700 Gross per 24 hour  Intake 240 ml  Output 675 ml  Net -435 ml      01/30/2023    4:23 AM 01/29/2023    5:00 AM 01/28/2023    4:58 AM  Last 3 Weights  Weight (lbs) 181 lb 180 lb 1.9 oz 179 lb 3.2 oz  Weight (kg) 82.1 kg 81.7 kg 81.285 kg      Telemetry    Rate controlled afib - Personally Reviewed  ECG    N/a - Personally Reviewed  Physical Exam   GEN: No acute distress.   Neck: +JVD Cardiac: irreg, 2/6 diastolic murmur rusb Respiratory: Clear to auscultation bilaterally. GI: Soft, nontender, non-distended  MS: 2+ bilateral LE edema Neuro:  Nonfocal  Psych: Normal affect   Labs    High Sensitivity Troponin:   Recent Labs  Lab 01/27/23 1006 01/27/23 1203  TROPONINIHS 157* 138*     Chemistry Recent Labs  Lab 01/27/23 1006 01/28/23 0432 01/29/23 0443 01/30/23 0414  NA 139 137  136 133*  K 3.6 3.0* 3.5 3.9  CL 102 102 101 100  CO2 26 28 27 26   GLUCOSE 145* 80 81 90  BUN 27* 24* 22 23  CREATININE 1.23 1.08 0.97 0.98  CALCIUM 8.6* 8.2* 8.3* 8.3*  MG 1.9 1.8 2.0 2.0  PROT 5.7*  --   --   --   ALBUMIN 2.5*  --   --   --   AST 26  --   --   --   ALT 19  --   --   --   ALKPHOS 57  --   --   --   BILITOT 1.1  --   --   --   GFRNONAA 54* >60 >60 >60  ANIONGAP 11 7 8 7     Lipids No results for input(s): "CHOL", "TRIG", "HDL", "LABVLDL", "LDLCALC", "CHOLHDL" in the last 168 hours.  Hematology Recent Labs  Lab 01/27/23 1006  WBC 4.9  RBC 3.20*  HGB 12.0*  HCT 35.7*  MCV 111.6*  MCH 37.5*  MCHC 33.6  RDW 15.8*  PLT 121*   Thyroid No results for input(s): "TSH", "FREET4" in the last 168  hours.  BNP Recent Labs  Lab 01/27/23 1006 01/28/23 0432 01/29/23 0443  BNP 1,031.0* 1,220.0* 1,204.0*    DDimer No results for input(s): "DDIMER" in the last 168 hours.   Radiology    No results found.  Cardiac Studies    Patient Profile     TORREY BALLINAS is a 89 y.o. male with a hx of HTN, HLD, Stage 3 CKD, severe aortic valve regurgitation, ascending aorta dilatation (at 49 mm by echo in 04/2022), persistent atrial fibrillation and chronic HFpEF who is being seen 01/28/2023 for the evaluation of CHF and aortic valve disease at the request of Dr. Laural Benes.   Assessment & Plan    1.Acute on chronic HFpEF - Jan 2024 echo: LVE 60-65%, indet diastolic, RV not well visualized, mod possible severe AI - BNP 1031, CXR trace effusions - I/Os incomplete yesterday, neg roughly 4.6 L since admission. He is on IV lasix 40mg  bid, stable renal function. Reds vest yesterday 31% - remains volume overloaded by exam. SOB improving but ongoing orthopnea. Increase IV lasix to 60mg  bid.  - was on torsemide 40mg  bid at home, will need titration at dischrge.  -consider SGLT2i, fever last night await primary team workup before starting in case UTI   2. Aortic regurgitation -  eccentric, moderate to severe - patient declined further workup including TEE  3. Persistent afib - lopressor started for rate control - He was previously on Eliquis for anticoagulation but this was discontinued in 08/2022 after he suffered an ICH in the setting of a fall.  - rate controlled afib on tele    For questions or updates, please contact [facility] Please consult [email or web address] for contact info under        Signed, Dina Rich, MD  01/30/2023, 9:05 AM

## 2023-01-31 DIAGNOSIS — I5031 Acute diastolic (congestive) heart failure: Secondary | ICD-10-CM | POA: Diagnosis not present

## 2023-01-31 DIAGNOSIS — I351 Nonrheumatic aortic (valve) insufficiency: Secondary | ICD-10-CM | POA: Diagnosis not present

## 2023-01-31 LAB — CBC
HCT: 33.2 % — ABNORMAL LOW (ref 39.0–52.0)
Hemoglobin: 11.1 g/dL — ABNORMAL LOW (ref 13.0–17.0)
MCH: 36.4 pg — ABNORMAL HIGH (ref 26.0–34.0)
MCHC: 33.4 g/dL (ref 30.0–36.0)
MCV: 108.9 fL — ABNORMAL HIGH (ref 80.0–100.0)
Platelets: 107 10*3/uL — ABNORMAL LOW (ref 150–400)
RBC: 3.05 MIL/uL — ABNORMAL LOW (ref 4.22–5.81)
RDW: 14.7 % (ref 11.5–15.5)
WBC: 5.6 10*3/uL (ref 4.0–10.5)
nRBC: 0 % (ref 0.0–0.2)

## 2023-01-31 LAB — BASIC METABOLIC PANEL
Anion gap: 9 (ref 5–15)
BUN: 22 mg/dL (ref 8–23)
CO2: 25 mmol/L (ref 22–32)
Calcium: 8.4 mg/dL — ABNORMAL LOW (ref 8.9–10.3)
Chloride: 100 mmol/L (ref 98–111)
Creatinine, Ser: 1 mg/dL (ref 0.61–1.24)
GFR, Estimated: >60 mL/min (ref >60)
Glucose, Bld: 80 mg/dL (ref 70–99)
Potassium: 3.8 mmol/L (ref 3.5–5.1)
Sodium: 134 mmol/L — ABNORMAL LOW (ref 135–145)

## 2023-01-31 LAB — MAGNESIUM: Magnesium: 2 mg/dL (ref 1.7–2.4)

## 2023-01-31 NOTE — Progress Notes (Signed)
PROGRESS NOTE    Brett Blankenship  KYH:[id number] DOB: 04-30-1926 DOA: 01/27/2023 PCP: Mechele Claude, MD   Brief Narrative:  89 year old gentleman with diastolic heart failure, atrial fibrillation (dx 01/24), recovered from bout of herpes zoster earlier this year, history of severe eccentric aortic valve regurgitation, history of subdural hematoma after a fall and subsequently taken off apixaban May 2024, ascending aorta dilatation (49 mm), former smoker, HTN, Hyperlipidemia, known cystic lesion in the pancreas (measuring up to 21 mm), recent bouts of acute HF exacerbations this year presents with shortness of breath.  He has been followed closely by cardiology this year.  He declined further work up for his valvular disease and deemed not a candidate for surgical intervention due to advanced age.  He had been changed to torsemide 40 mg twice daily in June 2024 for better diuresis and reports compliance with taking medication.   He says that for the past couple of days he has had progressive edema in both legs and feet.  He feels tired and he had to sleep in the recliner last night because of shortness of breath.  He denies chest pain.  He is easily fatigued.  He denies palpitations.  He reports that his weight is about 5 pounds higher than normal.  In the ED he was evaluated and noted to have a BNP >1000.  He had clinical exam findings of heart failure.  He was given IV furosemide 80 mg and admission was requested for further management of acute heart failure.      Assessment & Plan:   Principal Problem:   Acute heart failure (HCC) Active Problems:   A-fib (HCC)   Macrocytic anemia   Stage 3b chronic kidney disease (CKD) (HCC)   Essential hypertension   Mixed hyperlipidemia   Systolic murmur   Aortic regurgitation   Dilatation of aorta (HCC)   Chronic diastolic heart failure (HCC)  Assessment and Plan:   Acute HFpEF - recent exacerbations secondary to significant aortic valvular  disease  - pt reports in the past he has responded well to IV furosemide treatment in the hospital  - monitor electrolytes closely - low sodium diet ordered - check ReDs Vest reading - monitor intake/output and daily weights  - follow BNP  - most recent 2D echo Jan 2024: LVEF 60-65%, severely dilated LA, eccentric aortic valve regurgitation likely severe and chronic worsened from 2018.  -Appreciate ongoing cardiology recommendations with plans to increase IV Lasix to 60 mg twice daily -SGLT2 inhibitor per cardiology, urine analysis with no significant findings     Stage 3b CKD - monitoring renal function closely while on IV furosemide diuresis   Persistent Atrial Fibrillation - toprol XL 12.5 mg daily for HR control  - apixaban was discontinued in May 2024 after fall and subdural hematoma - pt reportedly not a watchman candidate due to patient refusal and severe AI   Severe aortic valve regurgitation and aortic stenosis - reportedly not a surgical candidate due to advanced age and his refusal for invasive procedures including TEE.  - continue IV furosemide for diuresis   Macrocytic anemia and thrombocytopenia  - continue daily folic acid and B12    Cystic pancreatic lesions - pt did not pursue further workup for this given advanced age   Essential hypertension  - resume home medication - diuresing with IV furosemide as noted - toprol XL 12.5 mg added 10/1 by heartcare team   Recently recovered herpes zoster outbreak - lesions fully healed  -  completed all the treatments     DVT prophylaxis:Heparin Code Status: DNR Family Communication: Son at bedside 10/2 Disposition Plan:  Status is: Inpatient Remains inpatient appropriate because: Need for IV medications.   Consultants:  Cardiology  Procedures:  None  Antimicrobials:  None   Subjective: Patient seen and evaluated today with no new acute complaints or concerns. No acute concerns or events noted overnight.   He continues to diurese aggressively.  Objective: Vitals:   01/30/23 1436 01/30/23 2203 01/31/23 0500 01/31/23 0509  BP: (!) 131/92 113/77  137/62  Pulse: 78 93  93  Resp:  20    Temp:  99.5 F (37.5 C)  97.9 F (36.6 C)  TempSrc:    Oral  SpO2: 98% 95%  94%  Weight:   81.8 kg   Height:        Intake/Output Summary (Last 24 hours) at 01/31/2023 1020 Last data filed at 01/31/2023 0900 Gross per 24 hour  Intake 480 ml  Output 1750 ml  Net -1270 ml   Filed Weights   01/29/23 0500 01/30/23 0423 01/31/23 0500  Weight: 81.7 kg 82.1 kg 81.8 kg    Examination:  General exam: Appears calm and comfortable  Respiratory system: Clear to auscultation. Respiratory effort normal. Cardiovascular system: S1 & S2 heard, RRR.  Systolic murmur. Gastrointestinal system: Abdomen is soft Central nervous system: Alert and awake Extremities: Bilateral 2+ lower extremity edema. Skin: No significant lesions noted Psychiatry: Flat affect.    Data Reviewed: I have personally reviewed following labs and imaging studies  CBC: Recent Labs  Lab 01/27/23 1006 01/31/23 0439  WBC 4.9 5.6  NEUTROABS 3.5  --   HGB 12.0* 11.1*  HCT 35.7* 33.2*  MCV 111.6* 108.9*  PLT 121* 107*   Basic Metabolic Panel: Recent Labs  Lab 01/27/23 1006 01/28/23 0432 01/29/23 0443 01/30/23 0414 01/31/23 0439  NA 139 137 136 133* 134*  K 3.6 3.0* 3.5 3.9 3.8  CL 102 102 101 100 100  CO2 26 28 27 26 25   GLUCOSE 145* 80 81 90 80  BUN 27* 24* 22 23 22   CREATININE 1.23 1.08 0.97 0.98 1.00  CALCIUM 8.6* 8.2* 8.3* 8.3* 8.4*  MG 1.9 1.8 2.0 2.0 2.0   GFR: Estimated Creatinine Clearance: 45.2 mL/min (by C-G formula based on SCr of 1 mg/dL). Liver Function Tests: Recent Labs  Lab 01/27/23 1006  AST 26  ALT 19  ALKPHOS 57  BILITOT 1.1  PROT 5.7*  ALBUMIN 2.5*   No results for input(s): "LIPASE", "AMYLASE" in the last 168 hours. No results for input(s): "AMMONIA" in the last 168 hours. Coagulation  Profile: No results for input(s): "INR", "PROTIME" in the last 168 hours. Cardiac Enzymes: No results for input(s): "CKTOTAL", "CKMB", "CKMBINDEX", "TROPONINI" in the last 168 hours. BNP (last 3 results) No results for input(s): "PROBNP" in the last 8760 hours. HbA1C: No results for input(s): "HGBA1C" in the last 72 hours. CBG: No results for input(s): "GLUCAP" in the last 168 hours. Lipid Profile: No results for input(s): "CHOL", "HDL", "LDLCALC", "TRIG", "CHOLHDL", "LDLDIRECT" in the last 72 hours. Thyroid Function Tests: No results for input(s): "TSH", "T4TOTAL", "FREET4", "T3FREE", "THYROIDAB" in the last 72 hours. Anemia Panel: No results for input(s): "VITAMINB12", "FOLATE", "FERRITIN", "TIBC", "IRON", "RETICCTPCT" in the last 72 hours. Sepsis Labs: No results for input(s): "PROCALCITON", "LATICACIDVEN" in the last 168 hours.  Recent Results (from the past 240 hour(s))  SARS Coronavirus 2 by RT PCR (hospital order, performed in [facility] hospital lab) *cepheid single result test* Anterior Nasal Swab     Status: None   Collection Time: 01/27/23 10:05 AM   Specimen: Anterior Nasal Swab  Result Value Ref Range Status   SARS Coronavirus 2 by RT PCR NEGATIVE NEGATIVE Final    Comment: (NOTE) SARS-CoV-2 target nucleic acids are NOT DETECTED.  The SARS-CoV-2 RNA is generally detectable in upper and lower respiratory specimens during the acute phase of infection. The lowest concentration of SARS-CoV-2 viral copies this assay can detect is 250 copies / mL. A negative result does not preclude SARS-CoV-2 infection and should not be used as the sole basis for treatment or other patient management decisions.  A negative result may occur with improper specimen collection / handling, submission of specimen other than nasopharyngeal swab, presence of viral mutation(s) within the areas targeted by this assay, and inadequate number of viral copies (<250 copies / mL). A negative result must  be combined with clinical observations, patient history, and epidemiological information.  Fact Sheet for Patients:   RoadLapTop.co.za  Fact Sheet for Healthcare Providers: http://kim-miller.com/  This test is not yet approved or  cleared by the [place] FDA and has been authorized for detection and/or diagnosis of SARS-CoV-2 by FDA under an Emergency Use Authorization (EUA).  This EUA will remain in effect (meaning this test can be used) for the duration of the COVID-19 declaration under Section 564(b)(1) of the Act, 21 U.S.C. section 360bbb-3(b)(1), unless the authorization is terminated or revoked sooner.  Performed at [facility], [address] [place], [place] [address]          Radiology Studies: No results found.      Scheduled Meds:  fluticasone  1 spray Each Nare Daily   furosemide  60 mg Intravenous BID   heparin  5,000 Units Subcutaneous Q8H   metoprolol succinate  12.5 mg Oral Daily   omega-3 acid ethyl esters  2 g Oral Daily   potassium chloride SA  40 mEq Oral BID   sodium chloride flush  3 mL Intravenous Q12H   Continuous Infusions:  sodium chloride       LOS: 4 days    Time spent: 35 minutes    Ivonne [place]burg Hoover Brunette, DO [facility]  If 7PM-7AM, please contact night-coverage [email or web address] 01/31/2023, 10:20 AM

## 2023-01-31 NOTE — Care Management Important Message (Signed)
Important Message  Patient Details  Name: GILFORD LARDIZABAL MRN: [id number] Date of Birth: [birth year]   Important Message Given:  Yes - Medicare IM (spoke with son Jorja Loa, no additional letter needed)     Corey Harold 01/31/2023, 3:52 PM

## 2023-01-31 NOTE — Progress Notes (Signed)
Rounding Note    Patient Name: Brett Blankenship Date of Encounter: 01/31/2023  [facility] Cardiologist: Marjo Bicker, MD   Subjective   SOB improving but not resolved, some ongoint orthopnea  Inpatient Medications    Scheduled Meds:  fluticasone  1 spray Each Nare Daily   furosemide  60 mg Intravenous BID   heparin  5,000 Units Subcutaneous Q8H   metoprolol succinate  12.5 mg Oral Daily   omega-3 acid ethyl esters  2 g Oral Daily   potassium chloride SA  40 mEq Oral BID   sodium chloride flush  3 mL Intravenous Q12H   Continuous Infusions:  sodium chloride     PRN Meds: sodium chloride, acetaminophen, ondansetron (ZOFRAN) IV, sodium chloride flush, traMADol, traZODone   Vital Signs    Vitals:   01/30/23 1436 01/30/23 2203 01/31/23 0500 01/31/23 0509  BP: (!) 131/92 113/77  137/62  Pulse: 78 93  93  Resp:  20    Temp:  99.5 F (37.5 C)  97.9 F (36.6 C)  TempSrc:    Oral  SpO2: 98% 95%  94%  Weight:   81.8 kg   Height:        Intake/Output Summary (Last 24 hours) at 01/31/2023 0757 Last data filed at 01/31/2023 0500 Gross per 24 hour  Intake 240 ml  Output 1750 ml  Net -1510 ml      01/31/2023    5:00 AM 01/30/2023    4:23 AM 01/29/2023    5:00 AM  Last 3 Weights  Weight (lbs) 180 lb 4.8 oz 181 lb 180 lb 1.9 oz  Weight (kg) 81.784 kg 82.1 kg 81.7 kg      Telemetry    Rate controlled afib - Personally Reviewed  ECG    N/a - Personally Reviewed  Physical Exam   GEN: No acute distress.   Neck: +JVD Cardiac: irreg, 2/6 diastolic murmur rusb Respiratory: Clear to auscultation bilaterally. GI: Soft, nontender, non-distended  MS: 2+ bilateral LE edema Neuro:  Nonfocal  Psych: Normal affect   Labs    High Sensitivity Troponin:   Recent Labs  Lab 01/27/23 1006 01/27/23 1203  TROPONINIHS 157* 138*     Chemistry Recent Labs  Lab 01/27/23 1006 01/28/23 0432 01/29/23 0443 01/30/23 0414 01/31/23 0439  NA 139   < >  136 133* 134*  K 3.6   < > 3.5 3.9 3.8  CL 102   < > 101 100 100  CO2 26   < > 27 26 25   GLUCOSE 145*   < > 81 90 80  BUN 27*   < > 22 23 22   CREATININE 1.23   < > 0.97 0.98 1.00  CALCIUM 8.6*   < > 8.3* 8.3* 8.4*  MG 1.9   < > 2.0 2.0 2.0  PROT 5.7*  --   --   --   --   ALBUMIN 2.5*  --   --   --   --   AST 26  --   --   --   --   ALT 19  --   --   --   --   ALKPHOS 57  --   --   --   --   BILITOT 1.1  --   --   --   --   GFRNONAA 54*   < > >60 >60 >60  ANIONGAP 11   < > 8 7 9    < > =  values in this interval not displayed.    Lipids No results for input(s): "CHOL", "TRIG", "HDL", "LABVLDL", "LDLCALC", "CHOLHDL" in the last 168 hours.  Hematology Recent Labs  Lab 01/27/23 1006 01/31/23 0439  WBC 4.9 5.6  RBC 3.20* 3.05*  HGB 12.0* 11.1*  HCT 35.7* 33.2*  MCV 111.6* 108.9*  MCH 37.5* 36.4*  MCHC 33.6 33.4  RDW 15.8* 14.7  PLT 121* 107*   Thyroid No results for input(s): "TSH", "FREET4" in the last 168 hours.  BNP Recent Labs  Lab 01/27/23 1006 01/28/23 0432 01/29/23 0443  BNP 1,031.0* 1,220.0* 1,204.0*    DDimer No results for input(s): "DDIMER" in the last 168 hours.   Radiology    No results found.  Cardiac Studies    Patient Profile     Brett Blankenship is a 89 y.o. male with a hx of HTN, HLD, Stage 3 CKD, severe aortic valve regurgitation, ascending aorta dilatation (at 49 mm by echo in 04/2022), persistent atrial fibrillation and chronic HFpEF who is being seen 01/28/2023 for the evaluation of CHF and aortic valve disease at the request of Dr. Laural Benes.   Assessment & Plan    1.Acute on chronic HFpEF - Jan 2024 echo: LVE 60-65%, indet diastolic, RV not well visualized, mod possible severe AI - BNP 1031, CXR trace effusions - I/Os negative On IV lasix 60mg  bid, stable renal function. Reds vest 31% - remains volume overloaded by exam. SOB improving but ongoing orthopnea. I  - was on torsemide 40mg  bid at home, will need titration at dischrge.   -consider SGLT2i, fever last night await primary team workup before starting in case UTI  2. Aortic regurgitation - eccentric, moderate to severe - patient declined further workup including TEE  3. Persistent afib - lopressor started for rate control - He was previously on Eliquis for anticoagulation but this was discontinued in 08/2022 after he suffered an ICH in the setting of a fall.  - rate controlled afib on tele  Note patient lives with son. He indicates DNR would be appropriate Chart notes Full code Wife died 8 years ago Primary service may want to change    For questions or updates, please contact [facility] Please consult [email or web address] for contact info under        Signed, Charlton Haws, MD  01/31/2023, 7:57 AM

## 2023-01-31 NOTE — Plan of Care (Signed)

## 2023-02-01 DIAGNOSIS — I5031 Acute diastolic (congestive) heart failure: Secondary | ICD-10-CM | POA: Diagnosis not present

## 2023-02-01 LAB — BASIC METABOLIC PANEL
Anion gap: 7 (ref 5–15)
BUN: 25 mg/dL — ABNORMAL HIGH (ref 8–23)
CO2: 26 mmol/L (ref 22–32)
Calcium: 8.7 mg/dL — ABNORMAL LOW (ref 8.9–10.3)
Chloride: 102 mmol/L (ref 98–111)
Creatinine, Ser: 0.96 mg/dL (ref 0.61–1.24)
GFR, Estimated: >60 mL/min (ref >60)
Glucose, Bld: 83 mg/dL (ref 70–99)
Potassium: 3.5 mmol/L (ref 3.5–5.1)
Sodium: 135 mmol/L (ref 135–145)

## 2023-02-01 LAB — MAGNESIUM: Magnesium: 1.9 mg/dL (ref 1.7–2.4)

## 2023-02-01 NOTE — Plan of Care (Signed)

## 2023-02-01 NOTE — Progress Notes (Signed)
PROGRESS NOTE    Brett Blankenship  ZOX:[id number] DOB: [birth year] DOA: 01/27/2023 PCP: Mechele Claude, MD   Brief Narrative:  89 year old gentleman with diastolic heart failure, atrial fibrillation (dx 01/24), recovered from bout of herpes zoster earlier this year, history of severe eccentric aortic valve regurgitation, history of subdural hematoma after a fall and subsequently taken off apixaban May 2024, ascending aorta dilatation (49 mm), former smoker, HTN, Hyperlipidemia, known cystic lesion in the pancreas (measuring up to 21 mm), recent bouts of acute HF exacerbations this year presents with shortness of breath.  He has been followed closely by cardiology this year.  He declined further work up for his valvular disease and deemed not a candidate for surgical intervention due to advanced age.  He had been changed to torsemide 40 mg twice daily in June 2024 for better diuresis and reports compliance with taking medication.   He says that for the past couple of days he has had progressive edema in both legs and feet.  He feels tired and he had to sleep in the recliner last night because of shortness of breath.  He denies chest pain.  He is easily fatigued.  He denies palpitations.  He reports that his weight is about 5 pounds higher than normal.  In the ED he was evaluated and noted to have a BNP >1000.  He had clinical exam findings of heart failure.  He was given IV furosemide 80 mg and admission was requested for further management of acute heart failure.      Assessment & Plan:   Principal Problem:   Acute heart failure (HCC) Active Problems:   A-fib (HCC)   Macrocytic anemia   Stage 3b chronic kidney disease (CKD) (HCC)   Essential hypertension   Mixed hyperlipidemia   Systolic murmur   Aortic regurgitation   Dilatation of aorta (HCC)   Chronic diastolic heart failure (HCC)  Assessment and Plan:   Acute HFpEF - recent exacerbations secondary to significant aortic valvular  disease  - pt reports in the past he has responded well to IV furosemide treatment in the hospital  - monitor electrolytes closely - low sodium diet ordered - monitor intake/output and daily weights  - follow BNP  - most recent 2D echo Jan 2024: LVEF 60-65%, severely dilated LA, eccentric aortic valve regurgitation likely severe and chronic worsened from 2018.  -Appreciate ongoing cardiology recommendations with plans to increase IV Lasix to 60 mg twice daily -SGLT2 inhibitor per cardiology, urine analysis with no significant findings     Stage 3b CKD - monitoring renal function closely while on IV furosemide diuresis   Persistent Atrial Fibrillation - toprol XL 12.5 mg daily for HR control  - apixaban was discontinued in May 2024 after fall and subdural hematoma - pt reportedly not a watchman candidate due to patient refusal and severe AI   Severe aortic valve regurgitation and aortic stenosis - reportedly not a surgical candidate due to advanced age and his refusal for invasive procedures including TEE.  - continue IV furosemide for diuresis   Macrocytic anemia and thrombocytopenia  - continue daily folic acid and B12    Cystic pancreatic lesions - pt did not pursue further workup for this given advanced age   Essential hypertension  - resume home medication - diuresing with IV furosemide as noted - toprol XL 12.5 mg added 10/1 by heartcare team   Recently recovered herpes zoster outbreak - lesions fully healed  - completed all the treatments  DVT prophylaxis:Heparin Code Status: DNR Family Communication: Son at bedside 10/2 Disposition Plan:  Status is: Inpatient Remains inpatient appropriate because: Need for IV medications.   Consultants:  Cardiology  Procedures:  None  Antimicrobials:  None   Subjective: Patient seen and evaluated today with no new acute complaints or concerns. No acute concerns or events noted overnight.  He continues to diurese  aggressively.  Objective: Vitals:   01/31/23 1504 01/31/23 2040 02/01/23 0348 02/01/23 0410  BP: (!) 144/82 (!) 150/73 131/67   Pulse: 84 93 87   Resp: 18 18 18    Temp: 99.3 F (37.4 C) 98.1 F (36.7 C) 98.8 F (37.1 C)   TempSrc: Oral Oral Oral   SpO2: 99% 98% 97%   Weight:    79.1 kg  Height:        Intake/Output Summary (Last 24 hours) at 02/01/2023 1124 Last data filed at 02/01/2023 0558 Gross per 24 hour  Intake 480 ml  Output 1550 ml  Net -1070 ml   Filed Weights   01/30/23 0423 01/31/23 0500 02/01/23 0410  Weight: 82.1 kg 81.8 kg 79.1 kg    Examination:  General exam: Appears calm and comfortable  Respiratory system: Clear to auscultation. Respiratory effort normal. Cardiovascular system: S1 & S2 heard, RRR.  Systolic murmur. Gastrointestinal system: Abdomen is soft Central nervous system: Alert and awake Extremities: Bilateral 2+ lower extremity edema. Skin: No significant lesions noted Psychiatry: Flat affect.    Data Reviewed: I have personally reviewed following labs and imaging studies  CBC: Recent Labs  Lab 01/27/23 1006 01/31/23 0439  WBC 4.9 5.6  NEUTROABS 3.5  --   HGB 12.0* 11.1*  HCT 35.7* 33.2*  MCV 111.6* 108.9*  PLT 121* 107*   Basic Metabolic Panel: Recent Labs  Lab 01/28/23 0432 01/29/23 0443 01/30/23 0414 01/31/23 0439 02/01/23 0654  NA 137 136 133* 134* 135  K 3.0* 3.5 3.9 3.8 3.5  CL 102 101 100 100 102  CO2 28 27 26 25 26   GLUCOSE 80 81 90 80 83  BUN 24* 22 23 22  25*  CREATININE 1.08 0.97 0.98 1.00 0.96  CALCIUM 8.2* 8.3* 8.3* 8.4* 8.7*  MG 1.8 2.0 2.0 2.0 1.9   GFR: Estimated Creatinine Clearance: 47.1 mL/min (by C-G formula based on SCr of 0.96 mg/dL). Liver Function Tests: Recent Labs  Lab 01/27/23 1006  AST 26  ALT 19  ALKPHOS 57  BILITOT 1.1  PROT 5.7*  ALBUMIN 2.5*   No results for input(s): "LIPASE", "AMYLASE" in the last 168 hours. No results for input(s): "AMMONIA" in the last 168  hours. Coagulation Profile: No results for input(s): "INR", "PROTIME" in the last 168 hours. Cardiac Enzymes: No results for input(s): "CKTOTAL", "CKMB", "CKMBINDEX", "TROPONINI" in the last 168 hours. BNP (last 3 results) No results for input(s): "PROBNP" in the last 8760 hours. HbA1C: No results for input(s): "HGBA1C" in the last 72 hours. CBG: No results for input(s): "GLUCAP" in the last 168 hours. Lipid Profile: No results for input(s): "CHOL", "HDL", "LDLCALC", "TRIG", "CHOLHDL", "LDLDIRECT" in the last 72 hours. Thyroid Function Tests: No results for input(s): "TSH", "T4TOTAL", "FREET4", "T3FREE", "THYROIDAB" in the last 72 hours. Anemia Panel: No results for input(s): "VITAMINB12", "FOLATE", "FERRITIN", "TIBC", "IRON", "RETICCTPCT" in the last 72 hours. Sepsis Labs: No results for input(s): "PROCALCITON", "LATICACIDVEN" in the last 168 hours.  Recent Results (from the past 240 hour(s))  SARS Coronavirus 2 by RT PCR (hospital order, performed in [facility] hospital lab) *cepheid  single result test* Anterior Nasal Swab     Status: None   Collection Time: 01/27/23 10:05 AM   Specimen: Anterior Nasal Swab  Result Value Ref Range Status   SARS Coronavirus 2 by RT PCR NEGATIVE NEGATIVE Final    Comment: (NOTE) SARS-CoV-2 target nucleic acids are NOT DETECTED.  The SARS-CoV-2 RNA is generally detectable in upper and lower respiratory specimens during the acute phase of infection. The lowest concentration of SARS-CoV-2 viral copies this assay can detect is 250 copies / mL. A negative result does not preclude SARS-CoV-2 infection and should not be used as the sole basis for treatment or other patient management decisions.  A negative result may occur with improper specimen collection / handling, submission of specimen other than nasopharyngeal swab, presence of viral mutation(s) within the areas targeted by this assay, and inadequate number of viral copies (<250 copies / mL). A  negative result must be combined with clinical observations, patient history, and epidemiological information.  Fact Sheet for Patients:   RoadLapTop.co.za  Fact Sheet for Healthcare Providers: http://kim-miller.com/  This test is not yet approved or  cleared by the [place] FDA and has been authorized for detection and/or diagnosis of SARS-CoV-2 by FDA under an Emergency Use Authorization (EUA).  This EUA will remain in effect (meaning this test can be used) for the duration of the COVID-19 declaration under Section 564(b)(1) of the Act, 21 U.S.C. section 360bbb-3(b)(1), unless the authorization is terminated or revoked sooner.  Performed at [facility], [address] [place], [place] [address]          Radiology Studies: No results found.      Scheduled Meds:  fluticasone  1 spray Each Nare Daily   furosemide  60 mg Intravenous BID   heparin  5,000 Units Subcutaneous Q8H   metoprolol succinate  12.5 mg Oral Daily   omega-3 acid ethyl esters  2 g Oral Daily   potassium chloride SA  40 mEq Oral BID   sodium chloride flush  3 mL Intravenous Q12H   Continuous Infusions:  sodium chloride       LOS: 5 days    Time spent: 35 minutes    Brett Lizardo Hoover Brunette, DO [facility]  If 7PM-7AM, please contact night-coverage [email or web address] 02/01/2023, 11:24 AM

## 2023-02-01 NOTE — Plan of Care (Signed)
  Problem: Clinical Measurements: Goal: Respiratory complications will improve Outcome: Completed/Met   Problem: Coping: Goal: Level of anxiety will decrease Outcome: Completed/Met   

## 2023-02-02 DIAGNOSIS — I5031 Acute diastolic (congestive) heart failure: Secondary | ICD-10-CM | POA: Diagnosis not present

## 2023-02-02 LAB — BASIC METABOLIC PANEL
Anion gap: 7 (ref 5–15)
BUN: 26 mg/dL — ABNORMAL HIGH (ref 8–23)
CO2: 27 mmol/L (ref 22–32)
Calcium: 8.6 mg/dL — ABNORMAL LOW (ref 8.9–10.3)
Chloride: 100 mmol/L (ref 98–111)
Creatinine, Ser: 1.04 mg/dL (ref 0.61–1.24)
GFR, Estimated: >60 mL/min (ref >60)
Glucose, Bld: 84 mg/dL (ref 70–99)
Potassium: 3.3 mmol/L — ABNORMAL LOW (ref 3.5–5.1)
Sodium: 134 mmol/L — ABNORMAL LOW (ref 135–145)

## 2023-02-02 LAB — MAGNESIUM: Magnesium: 1.9 mg/dL (ref 1.7–2.4)

## 2023-02-02 NOTE — Plan of Care (Signed)
  Problem: Education: Goal: Knowledge of General Education information will improve Description Including pain rating scale, medication(s)/side effects and non-pharmacologic comfort measures Outcome: Progressing   Problem: Health Behavior/Discharge Planning: Goal: Ability to manage health-related needs will improve Outcome: Progressing   

## 2023-02-02 NOTE — Plan of Care (Signed)

## 2023-02-02 NOTE — Progress Notes (Signed)
PROGRESS NOTE    Brett Blankenship  [name] DOB: Jan 29, 1927 DOA: 01/27/2023 PCP: Mechele Claude, MD   Brief Narrative:  89 year old gentleman with diastolic heart failure, atrial fibrillation (dx 01/24), recovered from bout of herpes zoster earlier this year, history of severe eccentric aortic valve regurgitation, history of subdural hematoma after a fall and subsequently taken off apixaban May 2024, ascending aorta dilatation (49 mm), former smoker, HTN, Hyperlipidemia, known cystic lesion in the pancreas (measuring up to 21 mm), recent bouts of acute HF exacerbations this year presents with shortness of breath.  He has been followed closely by cardiology this year.  He declined further work up for his valvular disease and deemed not a candidate for surgical intervention due to advanced age.  He had been changed to torsemide 40 mg twice daily in June 2024 for better diuresis and reports compliance with taking medication.   He says that for the past couple of days he has had progressive edema in both legs and feet.  He feels tired and he had to sleep in the recliner last night because of shortness of breath.  He denies chest pain.  He is easily fatigued.  He denies palpitations.  He reports that his weight is about 5 pounds higher than normal.  In the ED he was evaluated and noted to have a BNP >1000.  He had clinical exam findings of heart failure.  He was given IV furosemide 80 mg and admission was requested for further management of acute heart failure.      Assessment & Plan:   Principal Problem:   Acute heart failure (HCC) Active Problems:   A-fib (HCC)   Macrocytic anemia   Stage 3b chronic kidney disease (CKD) (HCC)   Essential hypertension   Mixed hyperlipidemia   Systolic murmur   Aortic regurgitation   Dilatation of aorta (HCC)   Chronic diastolic heart failure (HCC)  Assessment and Plan:   Acute HFpEF - recent exacerbations secondary to significant aortic valvular  disease  - pt reports in the past he has responded well to IV furosemide treatment in the hospital  - monitor electrolytes closely - low sodium diet ordered - monitor intake/output and daily weights  - follow BNP  - most recent 2D echo Jan 2024: LVEF 60-65%, severely dilated LA, eccentric aortic valve regurgitation likely severe and chronic worsened from 2018.  -Appreciate cardiology recommendations and plan will be to continue IV Lasix to 60 mg twice daily -SGLT2 inhibitor per cardiology, urine analysis with no significant findings     Stage 3b CKD - monitoring renal function closely while on IV furosemide diuresis   Persistent Atrial Fibrillation - toprol XL 12.5 mg daily for HR control  - apixaban was discontinued in May 2024 after fall and subdural hematoma - pt reportedly not a watchman candidate due to patient refusal and severe AI   Severe aortic valve regurgitation and aortic stenosis - reportedly not a surgical candidate due to advanced age and his refusal for invasive procedures including TEE.  - continue IV furosemide for diuresis   Macrocytic anemia and thrombocytopenia  - continue daily folic acid and B12    Cystic pancreatic lesions - pt did not pursue further workup for this given advanced age  Mild hypokalemia -Continue repletion and monitor in a.m.   Essential hypertension  - resume home medication - diuresing with IV furosemide as noted - toprol XL 12.5 mg added 10/1 by heartcare team   Recently recovered herpes zoster outbreak -  lesions fully healed  - completed all the treatments     DVT prophylaxis:Heparin Code Status: DNR Family Communication: Son at bedside 10/2 Disposition Plan:  Status is: Inpatient Remains inpatient appropriate because: Need for IV medications.   Consultants:  Cardiology  Procedures:  None  Antimicrobials:  None   Subjective: Patient seen and evaluated today with no new acute complaints or concerns. No acute  concerns or events noted overnight.  He continues to diurese aggressively and has had about 2 L of diuresis in the last 24 hours.  Objective: Vitals:   02/01/23 1205 02/01/23 1930 02/02/23 0500 02/02/23 0556  BP: 132/71 (!) 145/69  (!) 152/88  Pulse: 88 95  85  Resp: 18 18    Temp: 98.7 F (37.1 C) 98.3 F (36.8 C)  97.7 F (36.5 C)  TempSrc: Oral Oral  Oral  SpO2: 97% 97%  96%  Weight:   80.1 kg   Height:        Intake/Output Summary (Last 24 hours) at 02/02/2023 0955 Last data filed at 02/02/2023 0900 Gross per 24 hour  Intake 480 ml  Output 2600 ml  Net -2120 ml   Filed Weights   01/31/23 0500 02/01/23 0410 02/02/23 0500  Weight: 81.8 kg 79.1 kg 80.1 kg    Examination:  General exam: Appears calm and comfortable  Respiratory system: Clear to auscultation. Respiratory effort normal. Cardiovascular system: S1 & S2 heard, RRR.  Systolic murmur. Gastrointestinal system: Abdomen is soft Central nervous system: Alert and awake Extremities: Bilateral 2+ lower extremity edema. Skin: No significant lesions noted Psychiatry: Flat affect.    Data Reviewed: I have personally reviewed following labs and imaging studies  CBC: Recent Labs  Lab 01/27/23 1006 01/31/23 0439  WBC 4.9 5.6  NEUTROABS 3.5  --   HGB 12.0* 11.1*  HCT 35.7* 33.2*  MCV 111.6* 108.9*  PLT 121* 107*   Basic Metabolic Panel: Recent Labs  Lab 01/29/23 0443 01/30/23 0414 01/31/23 0439 02/01/23 0654 02/02/23 0457  NA 136 133* 134* 135 134*  K 3.5 3.9 3.8 3.5 3.3*  CL 101 100 100 102 100  CO2 27 26 25 26 27   GLUCOSE 81 90 80 83 84  BUN 22 23 22  25* 26*  CREATININE 0.97 0.98 1.00 0.96 1.04  CALCIUM 8.3* 8.3* 8.4* 8.7* 8.6*  MG 2.0 2.0 2.0 1.9 1.9   GFR: Estimated Creatinine Clearance: 43.4 mL/min (by C-G formula based on SCr of 1.04 mg/dL). Liver Function Tests: Recent Labs  Lab 01/27/23 1006  AST 26  ALT 19  ALKPHOS 57  BILITOT 1.1  PROT 5.7*  ALBUMIN 2.5*   No results for  input(s): "LIPASE", "AMYLASE" in the last 168 hours. No results for input(s): "AMMONIA" in the last 168 hours. Coagulation Profile: No results for input(s): "INR", "PROTIME" in the last 168 hours. Cardiac Enzymes: No results for input(s): "CKTOTAL", "CKMB", "CKMBINDEX", "TROPONINI" in the last 168 hours. BNP (last 3 results) No results for input(s): "PROBNP" in the last 8760 hours. HbA1C: No results for input(s): "HGBA1C" in the last 72 hours. CBG: No results for input(s): "GLUCAP" in the last 168 hours. Lipid Profile: No results for input(s): "CHOL", "HDL", "LDLCALC", "TRIG", "CHOLHDL", "LDLDIRECT" in the last 72 hours. Thyroid Function Tests: No results for input(s): "TSH", "T4TOTAL", "FREET4", "T3FREE", "THYROIDAB" in the last 72 hours. Anemia Panel: No results for input(s): "VITAMINB12", "FOLATE", "FERRITIN", "TIBC", "IRON", "RETICCTPCT" in the last 72 hours. Sepsis Labs: No results for input(s): "PROCALCITON", "LATICACIDVEN" in the last  168 hours.  Recent Results (from the past 240 hour(s))  SARS Coronavirus 2 by RT PCR (hospital order, performed in [facility] hospital lab) *cepheid single result test* Anterior Nasal Swab     Status: None   Collection Time: 01/27/23 10:05 AM   Specimen: Anterior Nasal Swab  Result Value Ref Range Status   SARS Coronavirus 2 by RT PCR NEGATIVE NEGATIVE Final    Comment: (NOTE) SARS-CoV-2 target nucleic acids are NOT DETECTED.  The SARS-CoV-2 RNA is generally detectable in upper and lower respiratory specimens during the acute phase of infection. The lowest concentration of SARS-CoV-2 viral copies this assay can detect is 250 copies / mL. A negative result does not preclude SARS-CoV-2 infection and should not be used as the sole basis for treatment or other patient management decisions.  A negative result may occur with improper specimen collection / handling, submission of specimen other than nasopharyngeal swab, presence of viral  mutation(s) within the areas targeted by this assay, and inadequate number of viral copies (<250 copies / mL). A negative result must be combined with clinical observations, patient history, and epidemiological information.  Fact Sheet for Patients:   RoadLapTop.co.za  Fact Sheet for Healthcare Providers: http://kim-miller.com/  This test is not yet approved or  cleared by the [place] FDA and has been authorized for detection and/or diagnosis of SARS-CoV-2 by FDA under an Emergency Use Authorization (EUA).  This EUA will remain in effect (meaning this test can be used) for the duration of the COVID-19 declaration under Section 564(b)(1) of the Act, 21 U.S.C. section 360bbb-3(b)(1), unless the authorization is terminated or revoked sooner.  Performed at [facility], [address] [place], [place] [address]          Radiology Studies: No results found.      Scheduled Meds:  fluticasone  1 spray Each Nare Daily   furosemide  60 mg Intravenous BID   heparin  5,000 Units Subcutaneous Q8H   metoprolol succinate  12.5 mg Oral Daily   omega-3 acid ethyl esters  2 g Oral Daily   potassium chloride SA  40 mEq Oral BID   sodium chloride flush  3 mL Intravenous Q12H   Continuous Infusions:  sodium chloride       LOS: 6 days    Time spent: 35 minutes    Mykenna Viele Hoover Brunette, DO [facility]  If 7PM-7AM, please contact night-coverage [email or web address] 02/02/2023, 9:55 AM

## 2023-02-03 ENCOUNTER — Ambulatory Visit: Payer: Medicare Other | Admitting: Family Medicine

## 2023-02-03 DIAGNOSIS — I4821 Permanent atrial fibrillation: Secondary | ICD-10-CM | POA: Diagnosis not present

## 2023-02-03 DIAGNOSIS — I351 Nonrheumatic aortic (valve) insufficiency: Secondary | ICD-10-CM | POA: Diagnosis not present

## 2023-02-03 DIAGNOSIS — I77819 Aortic ectasia, unspecified site: Secondary | ICD-10-CM | POA: Diagnosis not present

## 2023-02-03 DIAGNOSIS — Z8679 Personal history of other diseases of the circulatory system: Secondary | ICD-10-CM

## 2023-02-03 DIAGNOSIS — I5031 Acute diastolic (congestive) heart failure: Secondary | ICD-10-CM | POA: Diagnosis not present

## 2023-02-03 LAB — BASIC METABOLIC PANEL
Anion gap: 8 (ref 5–15)
BUN: 22 mg/dL (ref 8–23)
CO2: 26 mmol/L (ref 22–32)
Calcium: 8.6 mg/dL — ABNORMAL LOW (ref 8.9–10.3)
Chloride: 98 mmol/L (ref 98–111)
Creatinine, Ser: 1.02 mg/dL (ref 0.61–1.24)
GFR, Estimated: >60 mL/min (ref >60)
Glucose, Bld: 85 mg/dL (ref 70–99)
Potassium: 3.7 mmol/L (ref 3.5–5.1)
Sodium: 132 mmol/L — ABNORMAL LOW (ref 135–145)

## 2023-02-03 LAB — MAGNESIUM: Magnesium: 1.8 mg/dL (ref 1.7–2.4)

## 2023-02-03 MED ORDER — METOLAZONE 2.5 MG PO TABS: 2.5000 mg | ORAL_TABLET | ORAL | 1 refills | Status: DC

## 2023-02-03 NOTE — Care Management Important Message (Signed)
Important Message  Patient Details  Name: Brett Blankenship MRN: [id number] Date of Birth: 1926/07/06   Important Message Given:  Yes - Medicare IM (spoke with son Marcial Pacas to review letter, no additonal copy needed)     Corey Harold 02/03/2023, 11:23 AM

## 2023-02-03 NOTE — TOC Transition Note (Signed)
Transition of Care [facility]) - CM/SW Discharge Note   Patient Details  Name: Brett Blankenship MRN: [id number] Date of Birth: 1927/04/19  Transition of Care [facility]) CM/SW Contact:  Amarii Amy A Tomoki Lucken, RN Phone Number: 02/03/2023, 11:46 AM   Clinical Narrative:    Patient discharging home with [facility]. Kandee Keen accepted. MD placed orders.   Final next level of care: Home w Home Health Services Barriers to Discharge: Barriers Resolved   Patient Goals and CMS Choice CMS Medicare.gov Compare Post Acute Care list provided to:: Patient Choice offered to / list presented to : Patient  Discharge Placement                      Patient and family notified of of transfer: 02/03/23  Discharge Plan and Services Additional resources added to the After Visit Summary for   In-house Referral: Clinical Social Work Discharge Planning Services: CM Consult Post Acute Care Choice: Home Health                    HH Arranged: RN, PT, OT [facility] Agency: [facility] Date [facility] Agency Contacted: 02/03/23 Time HH Agency Contacted: 1145 Representative spoke with at [facility] Agency: Kandee Keen  Social Determinants of Health (SDOH) Interventions SDOH Screenings   Food Insecurity: No Food Insecurity (01/27/2023)  Housing: Patient Declined (01/27/2023)  Transportation Needs: No Transportation Needs (01/27/2023)  Utilities: Not At Risk (01/27/2023)  Alcohol Screen: Low Risk  (03/01/2022)  Depression (PHQ2-9): Low Risk  (12/06/2022)  Financial Resource Strain: Low Risk  (03/01/2022)  Physical Activity: Sufficiently Active (03/01/2022)  Social Connections: Moderately Isolated (03/01/2022)  Stress: No Stress Concern Present (03/01/2022)  Tobacco Use: Medium Risk (01/27/2023)     Readmission Risk Interventions    01/28/2023   12:05 PM  Readmission Risk Prevention Plan  Transportation Screening Complete  HRI or Home Care Consult Complete  Social Work Consult for Recovery Care Planning/Counseling Complete   Palliative Care Screening Not Applicable  Medication Review [employer]) Complete

## 2023-02-03 NOTE — Discharge Summary (Signed)
Physician Discharge Summary  Brett Blankenship NFA:[id number] DOB: Feb 03, 1927 DOA: 01/27/2023  PCP: Mechele Claude, MD  Admit date: 01/27/2023  Discharge date: 02/03/2023  Admitted From:Home  Disposition:  Home  Recommendations for Outpatient Follow-up:  Follow up with PCP in 1-2 weeks Follow-up with cardiology in 1 month with referral sent Continue on torsemide twice daily with the addition of metolazone 3 times per week Recommend boost nutritional shakes Continue other home medications as prior  Home Health: Health services  Equipment/Devices: Home hospital bed  Discharge Condition:Stable  CODE STATUS: DNR  Diet recommendation: Heart Healthy  Brief/Interim Summary: 89 year old gentleman with diastolic heart failure, atrial fibrillation (dx 01/24), recovered from bout of herpes zoster earlier this year, history of severe eccentric aortic valve regurgitation, history of subdural hematoma after a fall and subsequently taken off apixaban May 2024, ascending aorta dilatation (49 mm), former smoker, HTN, Hyperlipidemia, known cystic lesion in the pancreas (measuring up to 21 mm), recent bouts of acute HF exacerbations this year presents with shortness of breath.  He was admitted with acute HFpEF and required several days of IV diuresis for volume management.  He now appears to be near euvolemic and will be remaining on his home torsemide with the addition of metolazone with close follow-up to cardiology in 1 month.  He is in stable condition for discharge and has reached maximal medical benefit at this time.  No other acute events or concerns noted throughout the course of this hospitalization.  Discharge Diagnoses:  Principal Problem:   Acute heart failure (HCC) Active Problems:   A-fib (HCC)   Macrocytic anemia   Stage 3b chronic kidney disease (CKD) (HCC)   Essential hypertension   Mixed hyperlipidemia   Systolic murmur   Aortic regurgitation   Dilatation of aorta (HCC)    Chronic diastolic heart failure (HCC)  Principal discharge diagnosis: Acute HFpEF.  Discharge Instructions  Discharge Instructions     Ambulatory referral to Cardiology   Complete by: As directed    Diet - low sodium heart healthy   Complete by: As directed    Increase activity slowly   Complete by: As directed       Allergies as of 02/03/2023   No Known Allergies      Medication List     TAKE these medications    acetaminophen 500 MG tablet Commonly known as: TYLENOL Take 2 tablets (1,000 mg total) by mouth 3 (three) times daily. For pain   EYE VITAMINS & MINERALS PO Take by mouth. [employer]   fish oil-omega-3 fatty acids 1000 MG capsule Take 2 g by mouth daily.   metolazone 2.5 MG tablet Commonly known as: ZAROXOLYN Take 1 tablet (2.5 mg total) by mouth 3 (three) times a week. Take every Monday, Wednesday, and Friday   nabumetone 500 MG tablet Commonly known as: RELAFEN TAKE 2 TABLETS BY MOUTH TWICE DAILY AS NEEDED FOR PAIN. What changed: See the new instructions.   nitroGLYCERIN 0.4 MG SL tablet Commonly known as: NITROSTAT Place 1 tablet (0.4 mg total) under the tongue every 5 (five) minutes x 3 doses as needed for chest pain (if no relief after 3rd dose, proceed to ED or call 911).   OVER THE COUNTER MEDICATION Take 1 tablet by mouth daily. Vitamin B12   OVER THE COUNTER MEDICATION Take 1 tablet by mouth daily. Eye vitamin   potassium chloride SA 20 MEQ tablet Commonly known as: KLOR-CON M Take 2 tablets (40 mEq total) by mouth every morning AND 1  tablet (20 mEq total) every evening.   torsemide 20 MG tablet Commonly known as: DEMADEX Take 2 tablets (40 mg total) by mouth 2 (two) times daily.   traMADol 50 MG tablet Commonly known as: ULTRAM Take 1 tablet (50 mg total) by mouth 4 (four) times daily as needed for moderate pain.   Vitamin D (Ergocalciferol) 1.25 MG (50000 UNIT) Caps capsule Commonly known as: DRISDOL TAKE 1 CAPSULE BY MOUTH  ONCE EVERY 7 DAYS. What changed: See the new instructions.               Durable Medical Equipment  (From admission, onward)           Start     Ordered   01/29/23 1135  For home use only DME Hospital bed  Once       Question Answer Comment  Length of Need Lifetime   The above medical condition requires: Patient requires the ability to reposition frequently   Head must be elevated greater than: 30 degrees   Bed type Semi-electric      01/29/23 1135            Follow-up Information     Stacks, Broadus [name], MD. Schedule an appointment as soon as possible for a visit in 1 week(s).   Specialty: Family Medicine Contact information: [address] [place] [address] [phone]         [facility]. Go in 1 month(s).   Specialty: Cardiology Contact information: [address] [place] [address] [phone]               No Known Allergies  Consultations: Cardiology   Procedures/Studies: [facility] Chest Port 1 View  Result Date: 01/28/2023 CLINICAL DATA:  Acute heart failure. EXAM: PORTABLE CHEST 1 VIEW COMPARISON:  January 27, 2023. FINDINGS: Stable cardiomediastinal silhouette. Increased bibasilar opacities are noted concerning for worsening atelectasis or infiltrates. Bony thorax is unremarkable. IMPRESSION: Increased bibasilar opacities are noted concerning for worsening atelectasis or infiltrates. Electronically Signed   By: Lupita Raider M.D.   On: 01/28/2023 11:00   DG Chest 2 View  Result Date: 01/27/2023 CLINICAL DATA:  Provided history: Shortness of breath.  Shingles. EXAM: CHEST - 2 VIEW COMPARISON:  Prior chest radiographs 12/08/2022 and earlier. Chest CT 09/24/2022. FINDINGS: Cardiomegaly. A known aneurysm of the ascending thoracic aorta was better appreciated on the prior chest CT of 09/24/2022. Aortic atherosclerosis. Mild ill-defined and bandlike opacities at the lung bases. Trace bilateral pleural effusions. No  evidence of pneumothorax. No acute osseous abnormality identified. Spondylosis and dextrocurvature of the thoracic spine. IMPRESSION: 1. Mild ill-defined and bandlike opacities at the lung bases. While these findings may reflect atelectasis, pneumonia cannot be excluded. Radiographic follow-up to resolution recommended. 2. Trace bilateral pleural effusions. 3. Cardiomegaly. 4. Known ascending thoracic aortic aneurysm better appreciated on the prior chest CT of 09/24/2022. 5. Aortic Atherosclerosis (ICD10-I70.0). Electronically Signed   By: Jackey Loge D.O.   On: 01/27/2023 12:04     Discharge Exam: Vitals:   02/02/23 2046 02/03/23 0413  BP: (!) 133/58 119/73  Pulse: 85 79  Resp: 19 18  Temp: 98.9 F (37.2 C) 98.8 F (37.1 C)  SpO2: 93% 95%   Vitals:   02/02/23 0556 02/02/23 1220 02/02/23 2046 02/03/23 0413  BP: (!) 152/88 (!) 148/85 (!) 133/58 119/73  Pulse: 85 80 85 79  Resp:  18 19 18   Temp: 97.7 F (36.5 C) 97.9 F (36.6 C) 98.9 F (37.2  C) 98.8 F (37.1 C)  TempSrc: Oral Oral  Oral  SpO2: 96% 96% 93% 95%  Weight:    80.5 kg  Height:        General: Pt is alert, awake, not in acute distress Cardiovascular: RRR, S1/S2 +, no rubs, no gallops Respiratory: CTA bilaterally, no wheezing, no rhonchi Abdominal: Soft, NT, ND, bowel sounds + Extremities: no edema, no cyanosis    The results of significant diagnostics from this hospitalization (including imaging, microbiology, ancillary and laboratory) are listed below for reference.     Microbiology: Recent Results (from the past 240 hour(s))  SARS Coronavirus 2 by RT PCR (hospital order, performed in [facility] hospital lab) *cepheid single result test* Anterior Nasal Swab     Status: None   Collection Time: 01/27/23 10:05 AM   Specimen: Anterior Nasal Swab  Result Value Ref Range Status   SARS Coronavirus 2 by RT PCR NEGATIVE NEGATIVE Final    Comment: (NOTE) SARS-CoV-2 target nucleic acids are NOT DETECTED.  The  SARS-CoV-2 RNA is generally detectable in upper and lower respiratory specimens during the acute phase of infection. The lowest concentration of SARS-CoV-2 viral copies this assay can detect is 250 copies / mL. A negative result does not preclude SARS-CoV-2 infection and should not be used as the sole basis for treatment or other patient management decisions.  A negative result may occur with improper specimen collection / handling, submission of specimen other than nasopharyngeal swab, presence of viral mutation(s) within the areas targeted by this assay, and inadequate number of viral copies (<250 copies / mL). A negative result must be combined with clinical observations, patient history, and epidemiological information.  Fact Sheet for Patients:   RoadLapTop.co.za  Fact Sheet for Healthcare Providers: http://kim-miller.com/  This test is not yet approved or  cleared by the [place] FDA and has been authorized for detection and/or diagnosis of SARS-CoV-2 by FDA under an Emergency Use Authorization (EUA).  This EUA will remain in effect (meaning this test can be used) for the duration of the COVID-19 declaration under Section 564(b)(1) of the Act, 21 U.S.C. section 360bbb-3(b)(1), unless the authorization is terminated or revoked sooner.  Performed at [facility], [address] [place], [place] [address]      Labs: BNP (last 3 results) Recent Labs    01/27/23 1006 01/28/23 0432 01/29/23 0443  BNP 1,031.0* 1,220.0* 1,204.0*   Basic Metabolic Panel: Recent Labs  Lab 01/30/23 0414 01/31/23 0439 02/01/23 0654 02/02/23 0457 02/03/23 0431  NA 133* 134* 135 134* 132*  K 3.9 3.8 3.5 3.3* 3.7  CL 100 100 102 100 98  CO2 26 25 26 27 26   GLUCOSE 90 80 83 84 85  BUN 23 22 25* 26* 22  CREATININE 0.98 1.00 0.96 1.04 1.02  CALCIUM 8.3* 8.4* 8.7* 8.6* 8.6*  MG 2.0 2.0 1.9 1.9 1.8   Liver Function Tests: No results for  input(s): "AST", "ALT", "ALKPHOS", "BILITOT", "PROT", "ALBUMIN" in the last 168 hours. No results for input(s): "LIPASE", "AMYLASE" in the last 168 hours. No results for input(s): "AMMONIA" in the last 168 hours. CBC: Recent Labs  Lab 01/31/23 0439  WBC 5.6  HGB 11.1*  HCT 33.2*  MCV 108.9*  PLT 107*   Cardiac Enzymes: No results for input(s): "CKTOTAL", "CKMB", "CKMBINDEX", "TROPONINI" in the last 168 hours. BNP: Invalid input(s): "POCBNP" CBG: No results for input(s): "GLUCAP" in the last 168 hours. D-Dimer No results for input(s): "DDIMER" in the last 72 hours.  Hgb A1c No results for input(s): "HGBA1C" in the last 72 hours. Lipid Profile No results for input(s): "CHOL", "HDL", "LDLCALC", "TRIG", "CHOLHDL", "LDLDIRECT" in the last 72 hours. Thyroid function studies No results for input(s): "TSH", "T4TOTAL", "T3FREE", "THYROIDAB" in the last 72 hours.  Invalid input(s): "FREET3" Anemia work up No results for input(s): "VITAMINB12", "FOLATE", "FERRITIN", "TIBC", "IRON", "RETICCTPCT" in the last 72 hours. Urinalysis    Component Value Date/Time   COLORURINE STRAW (A) 01/30/2023 1210   APPEARANCEUR CLEAR 01/30/2023 1210   LABSPEC 1.006 01/30/2023 1210   PHURINE 6.0 01/30/2023 1210   GLUCOSEU NEGATIVE 01/30/2023 1210   HGBUR NEGATIVE 01/30/2023 1210   BILIRUBINUR NEGATIVE 01/30/2023 1210   KETONESUR NEGATIVE 01/30/2023 1210   PROTEINUR NEGATIVE 01/30/2023 1210   NITRITE NEGATIVE 01/30/2023 1210   LEUKOCYTESUR NEGATIVE 01/30/2023 1210   Sepsis Labs Recent Labs  Lab 01/31/23 0439  WBC 5.6   Microbiology Recent Results (from the past 240 hour(s))  SARS Coronavirus 2 by RT PCR (hospital order, performed in [facility] hospital lab) *cepheid single result test* Anterior Nasal Swab     Status: None   Collection Time: 01/27/23 10:05 AM   Specimen: Anterior Nasal Swab  Result Value Ref Range Status   SARS Coronavirus 2 by RT PCR NEGATIVE NEGATIVE Final    Comment:  (NOTE) SARS-CoV-2 target nucleic acids are NOT DETECTED.  The SARS-CoV-2 RNA is generally detectable in upper and lower respiratory specimens during the acute phase of infection. The lowest concentration of SARS-CoV-2 viral copies this assay can detect is 250 copies / mL. A negative result does not preclude SARS-CoV-2 infection and should not be used as the sole basis for treatment or other patient management decisions.  A negative result may occur with improper specimen collection / handling, submission of specimen other than nasopharyngeal swab, presence of viral mutation(s) within the areas targeted by this assay, and inadequate number of viral copies (<250 copies / mL). A negative result must be combined with clinical observations, patient history, and epidemiological information.  Fact Sheet for Patients:   RoadLapTop.co.za  Fact Sheet for Healthcare Providers: http://kim-miller.com/  This test is not yet approved or  cleared by the [place] FDA and has been authorized for detection and/or diagnosis of SARS-CoV-2 by FDA under an Emergency Use Authorization (EUA).  This EUA will remain in effect (meaning this test can be used) for the duration of the COVID-19 declaration under Section 564(b)(1) of the Act, 21 U.S.C. section 360bbb-3(b)(1), unless the authorization is terminated or revoked sooner.  Performed at [facility], [address], [place], [place] [address]      Time coordinating discharge: 35 minutes  SIGNED:   Erick Blinks, DO [facility] 02/03/2023, 10:30 AM  If 7PM-7AM, please contact night-coverage [email or web address]

## 2023-02-03 NOTE — Progress Notes (Signed)
Progress Note  Patient Name: Brett Blankenship Date of Encounter: 02/03/2023  Primary Cardiologist: Marjo Bicker, MD  Subjective   No acute events overnight, no DOE.  Lower EXTR swelling significantly improved.  Skin wrinkled.  Inpatient Medications    Scheduled Meds:  fluticasone  1 spray Each Nare Daily   furosemide  60 mg Intravenous BID   heparin  5,000 Units Subcutaneous Q8H   metoprolol succinate  12.5 mg Oral Daily   omega-3 acid ethyl esters  2 g Oral Daily   potassium chloride SA  40 mEq Oral BID   sodium chloride flush  3 mL Intravenous Q12H   Continuous Infusions:  sodium chloride     PRN Meds: sodium chloride, acetaminophen, ondansetron (ZOFRAN) IV, sodium chloride flush, traMADol, traZODone   Vital Signs    Vitals:   02/02/23 0556 02/02/23 1220 02/02/23 2046 02/03/23 0413  BP: (!) 152/88 (!) 148/85 (!) 133/58 119/73  Pulse: 85 80 85 79  Resp:  18 19 18   Temp: 97.7 F (36.5 C) 97.9 F (36.6 C) 98.9 F (37.2 C) 98.8 F (37.1 C)  TempSrc: Oral Oral  Oral  SpO2: 96% 96% 93% 95%  Weight:    80.5 kg  Height:        Intake/Output Summary (Last 24 hours) at 02/03/2023 0951 Last data filed at 02/03/2023 0414 Gross per 24 hour  Intake 480 ml  Output 1750 ml  Net -1270 ml   Filed Weights   02/01/23 0410 02/02/23 0500 02/03/23 0413  Weight: 79.1 kg 80.1 kg 80.5 kg    Telemetry     Personally reviewed, A-fib, 90s.  ECG    Not performed today  Physical Exam   GEN: No acute distress.   Neck: No JVD. Cardiac: Irregular rate and rhythm, diastolic murmur, rub, or gallop.  Respiratory: Nonlabored. Clear to auscultation bilaterally. GI: Soft, nontender, bowel sounds present. MS: 1-2+ pitting edema around the ankles; No deformity. Neuro:  Nonfocal. Psych: Alert and oriented x 3. Normal affect.  Labs    Chemistry Recent Labs  Lab 01/27/23 1006 01/28/23 0432 02/01/23 0654 02/02/23 0457 02/03/23 0431  NA 139   < > 135 134* 132*  K  3.6   < > 3.5 3.3* 3.7  CL 102   < > 102 100 98  CO2 26   < > 26 27 26   GLUCOSE 145*   < > 83 84 85  BUN 27*   < > 25* 26* 22  CREATININE 1.23   < > 0.96 1.04 1.02  CALCIUM 8.6*   < > 8.7* 8.6* 8.6*  PROT 5.7*  --   --   --   --   ALBUMIN 2.5*  --   --   --   --   AST 26  --   --   --   --   ALT 19  --   --   --   --   ALKPHOS 57  --   --   --   --   BILITOT 1.1  --   --   --   --   GFRNONAA 54*   < > >60 >60 >60  ANIONGAP 11   < > 7 7 8    < > = values in this interval not displayed.     Hematology Recent Labs  Lab 01/27/23 1006 01/31/23 0439  WBC 4.9 5.6  RBC 3.20* 3.05*  HGB 12.0* 11.1*  HCT 35.7* 33.2*  MCV  111.6* 108.9*  MCH 37.5* 36.4*  MCHC 33.6 33.4  RDW 15.8* 14.7  PLT 121* 107*    Cardiac Enzymes Recent Labs  Lab 01/27/23 1006 01/27/23 1203  TROPONINIHS 157* 138*    BNP Recent Labs  Lab 01/27/23 1006 01/28/23 0432 01/29/23 0443  BNP 1,031.0* 1,220.0* 1,204.0*     DDimerNo results for input(s): "DDIMER" in the last 168 hours.   Radiology    No results found.   Assessment & Plan    Acute on chronic diastolic heart failure, compensated Eccentric, moderate to severe aortic regurgitation likely secondary to moderate aortic dilatation Mild dilatation of the aortic root, 43 mm and moderate dilatation of the ascending aorta, 49 mm per echo in 04/2022 Permanent atrial fibrillation History of SAH, not on AC   -Asymptomatic, compensated, discontinue IV Lasix and resume home torsemide 40 mg twice daily. Start metolazone 2.5 mg 3 times a week. Okay to discontinue metoprolol upon discharge due to moderate to severe aortic regurgitation. Not a candidate for cardiothoracic surgical intervention due to advanced age.  He also has mild to moderate dilatation of the aorta, 43 mm aortic root and 49 mm ascending aorta for which I will forego CT imaging as he is not a candidate for surgical intervention. Refused any invasive procedures including TEE. He does have  bilateral lower EXTR swelling around the ankles which is likely secondary to severe hyperlipidemia, recommended to increase protein shakes.  CODE STATUS: DNR/DNI  [facility] will sign off.   Medication Recommendations: P.o. torsemide 40 mg twice daily, metolazone 2.5 mg 3 times a week Other recommendations (labs, testing, etc): None Follow up as an outpatient: Cardiology follow-up in 1 month     Signed, Buster Schueller P Corby Vandenberghe, MD  02/03/2023, 9:51 AM

## 2023-02-04 NOTE — Telephone Encounter (Signed)
Called to move apt- mail box full, unable to leave message

## 2023-02-05 ENCOUNTER — Telehealth: Payer: Self-pay

## 2023-02-05 NOTE — Consult Note (Signed)
[facility] Customer service manager [facility]) Accountable Care Organization (ACO) [facility] Liaison Note  02/05/2023  [name] [birth year] [id number]  Location: [facility] RN Hospital Liaison screened the patient remotely at [facility].  Insurance: [facility] Advantage   Brett Blankenship is a 89 y.o. male who is a Primary Care Patient of Mechele Claude, MD-[facility]. The patient was screened for readmission hospitalization with noted medium risk score for unplanned readmission risk with 2 IP/ 3 ED in 6 months.  The patient was assessed for potential [facility]) Care Management service needs for post hospital transition for care coordination. Review of patient's electronic medical record reveals patient was admitted with CHF. Pt discharged with [facility] for in home service. No anticipated needs at this time. Pt has a supportive son and daughter listed for his support system.  Plan: [facility] Liaison will continue to follow progress and disposition to asess for post hospital community care coordination/management needs.  Referral request for community care coordination: anticipate [facility] Transitions of Care Team follow up.   [facility] Care Management/Population Health does not replace or interfere with any arrangements made by the Inpatient Transition of Care team.   For questions contact:   Elliot Cousin, RN, [facility] Liaison [place]   Population Health Office Hours MTWF  8:00 am-6:00 pm [phone] mobile [phone] [Office toll free line] Office Hours are M-F 8:30 - 5 pm [email or web address]

## 2023-02-05 NOTE — Patient Outreach (Signed)
Care Management  Transitions of Care Program Transitions of Care Post-discharge Initial    02/05/2023 Name: Brett Blankenship MRN: [id number] DOB: [birth year]  Subjective: Brett Blankenship is a 89 y.o. year old male who is a primary care patient of Stacks, Broadus [name], MD. The Care Management team Engaged with patient  Engaged with the patient's son Jorja Loa  to assess and address transitions of care needs.   Consent to Services:  Patient was given information about care management services, agreed to services, and gave verbal consent to participate.   Assessment:  Date of Discharge: 02/03/23 [facility] Type of Discharge: Inpatient Admission Primary Inpatient Discharge Diagnosis:: Heart Failure  SDOH Interventions    Flowsheet Row Telephone from 02/05/2023 in [place] POPULATION HEALTH DEPARTMENT Telephone from 05/31/2022 in [facility] Community Care Coordination Office Visit from 05/23/2022 in [facility] Clinical Support from 03/01/2022 in [facility] Clinical Support from 02/28/2021 in [facility]  SDOH Interventions       Food Insecurity Interventions Intervention Not Indicated Intervention Not Indicated -- Intervention Not Indicated Intervention Not Indicated  Housing Interventions -- -- -- Intervention Not Indicated Intervention Not Indicated  Transportation Interventions Intervention Not Indicated Intervention Not Indicated -- Intervention Not Indicated Intervention Not Indicated  Depression Interventions/Treatment  -- -- Patient refuses Treatment -- --  Financial Strain Interventions -- -- -- Intervention Not Indicated Intervention Not Indicated  Physical Activity Interventions -- -- -- Intervention Not Indicated Intervention Not Indicated  Stress Interventions -- -- -- Intervention Not Indicated Intervention Not Indicated  Social Connections  Interventions -- -- -- Intervention Not Indicated Intervention Not Indicated        Goals Addressed             This Visit's Progress    Patient Stated       Current Barriers:  Chronic Disease Management support and education needs related to CHF   RNCM Clinical Goal(s):  Patient will work with the Care Management team over the next 30 days to address Transition of Care Barriers: Home Health services through collaboration with RN Care manager, provider, and care team.   Interventions: Evaluation of current treatment plan related to  self management and patient's adherence to plan as established by provider   Heart Failure Interventions:  (Status:  New goal.) Short Term Goal Reviewed role of diuretics in prevention of fluid overload and management of heart failure; Discussed the importance of keeping all appointments with provider Advised patient to discuss Code status and Palliative Care/Hospice with provider  Patient Goals/Self-Care Activities: Participate in Transition of Care Program/Attend [facility] scheduled calls Notify RN Care Manager of [facility] call rescheduling needs Take all medications as prescribed Attend all scheduled provider appointments  Follow Up Plan:  Telephone follow up appointment with care management team member scheduled for:  October 16 at 11:00am The patient has been provided with contact information for the care management team and has been advised to call with any health related questions or concerns.    Deidre Ala, RN RN Care Manager [facility]Population [facility] [phone]         Deidre Ala, RN RN Care Manager [facility] [phone]

## 2023-02-08 DIAGNOSIS — Z9181 History of falling: Secondary | ICD-10-CM | POA: Diagnosis not present

## 2023-02-08 DIAGNOSIS — D631 Anemia in chronic kidney disease: Secondary | ICD-10-CM | POA: Diagnosis not present

## 2023-02-08 DIAGNOSIS — E782 Mixed hyperlipidemia: Secondary | ICD-10-CM | POA: Diagnosis not present

## 2023-02-08 DIAGNOSIS — D696 Thrombocytopenia, unspecified: Secondary | ICD-10-CM | POA: Diagnosis not present

## 2023-02-08 DIAGNOSIS — D539 Nutritional anemia, unspecified: Secondary | ICD-10-CM | POA: Diagnosis not present

## 2023-02-08 DIAGNOSIS — I352 Nonrheumatic aortic (valve) stenosis with insufficiency: Secondary | ICD-10-CM | POA: Diagnosis not present

## 2023-02-08 DIAGNOSIS — Z79899 Other long term (current) drug therapy: Secondary | ICD-10-CM | POA: Diagnosis not present

## 2023-02-08 DIAGNOSIS — Z87440 Personal history of urinary (tract) infections: Secondary | ICD-10-CM | POA: Diagnosis not present

## 2023-02-08 DIAGNOSIS — Z87891 Personal history of nicotine dependence: Secondary | ICD-10-CM | POA: Diagnosis not present

## 2023-02-08 DIAGNOSIS — I5033 Acute on chronic diastolic (congestive) heart failure: Secondary | ICD-10-CM | POA: Diagnosis not present

## 2023-02-08 DIAGNOSIS — I4819 Other persistent atrial fibrillation: Secondary | ICD-10-CM | POA: Diagnosis not present

## 2023-02-08 DIAGNOSIS — N1832 Chronic kidney disease, stage 3b: Secondary | ICD-10-CM | POA: Diagnosis not present

## 2023-02-08 DIAGNOSIS — I130 Hypertensive heart and chronic kidney disease with heart failure and stage 1 through stage 4 chronic kidney disease, or unspecified chronic kidney disease: Secondary | ICD-10-CM | POA: Diagnosis not present

## 2023-02-08 DIAGNOSIS — K862 Cyst of pancreas: Secondary | ICD-10-CM | POA: Diagnosis not present

## 2023-02-10 ENCOUNTER — Ambulatory Visit: Payer: Medicare Other | Admitting: Family Medicine

## 2023-02-10 ENCOUNTER — Encounter: Payer: Self-pay | Admitting: Family Medicine

## 2023-02-10 VITALS — BP 157/73 | HR 90 | Temp 97.3°F | Ht 69.0 in | Wt 176.4 lb

## 2023-02-10 DIAGNOSIS — I5031 Acute diastolic (congestive) heart failure: Secondary | ICD-10-CM

## 2023-02-10 DIAGNOSIS — I5032 Chronic diastolic (congestive) heart failure: Secondary | ICD-10-CM | POA: Diagnosis not present

## 2023-02-10 DIAGNOSIS — I4821 Permanent atrial fibrillation: Secondary | ICD-10-CM | POA: Diagnosis not present

## 2023-02-10 DIAGNOSIS — N1832 Chronic kidney disease, stage 3b: Secondary | ICD-10-CM

## 2023-02-10 DIAGNOSIS — I509 Heart failure, unspecified: Secondary | ICD-10-CM

## 2023-02-10 MED ORDER — TORSEMIDE 20 MG PO TABS: 40.0000 mg | ORAL_TABLET | Freq: Two times a day (BID) | ORAL | 1 refills | Status: DC

## 2023-02-10 MED ORDER — NABUMETONE 500 MG PO TABS: 500.0000 mg | ORAL_TABLET | Freq: Two times a day (BID) | ORAL | 0 refills | Status: DC

## 2023-02-10 NOTE — Progress Notes (Signed)
Subjective:  Patient ID: Brett Blankenship, male    DOB: [birth year]  Age: 89 y.o. MRN: [id number]  CC: Hospitalization Follow-up   HPI [name] presents for ongoing edema after hospitalization for CHF. He was hospitalized from 9/30 to 10/7 and diuresed. He was also found to have a low albumin and placed on BID Boost protein shakes. Currently he denies dyspnea although there is swelling. He is taking demadex BID per cardiology. Metolazone was added to his regimen and his son states that a patch has been ordered for him to help prevent heart failure. He has cardiology appt. Later this month.  Diuresis has to be monitored carefully due to stage 3b CRF Atrial fibrillation follow up. Pt. is treated with rate control and anticoagulation. Pt.  denies palpitations, rapid rate, chest pain, dyspnea and edema. There has been no bleeding from nose or gums. Pt. has not noticed blood with urine or stool.  Although there is routine bruising easily, it is not excessive.      02/10/2023    4:03 PM 12/06/2022    2:08 PM 10/02/2022   10:01 AM  Depression screen PHQ 2/9  Decreased Interest 0 0 0  Down, Depressed, Hopeless 0 0 0  PHQ - 2 Score 0 0 0  Altered sleeping  0   Tired, decreased energy  0   Change in appetite  0   Feeling bad or failure about yourself   0   Trouble concentrating  0   Moving slowly or fidgety/restless  0   Suicidal thoughts  0   PHQ-9 Score  0     History Mikael has a past medical history of Aortic regurgitation, Atrial fibrillation (HCC), C7 cervical fracture (HCC), CHF (congestive heart failure) (HCC), and HYPERTENSION (07/12/2009).   He has a past surgical history that includes Appendectomy (1960) and Neck surgery.   His family history includes Cancer in his brother; Heart disease in his father; Hypertension in his father.He reports that he quit smoking about 59 years ago. His smoking use included cigarettes. He started smoking about 64 years ago. He has a 1.5  pack-year smoking history. He has never used smokeless tobacco. He reports that he does not currently use alcohol. He reports that he does not currently use drugs.    ROS Review of Systems  Constitutional:  Negative for fever.  Respiratory:  Negative for shortness of breath.   Cardiovascular:  Positive for leg swelling. Negative for chest pain.  Musculoskeletal:  Negative for arthralgias.  Skin:  Negative for rash.    Objective:  BP (!) 157/73   Pulse 90   Temp (!) 97.3 F (36.3 C)   Ht 5\' 9"  (1.753 m)   Wt 176 lb 6.4 oz (80 kg)   SpO2 97%   BMI 26.05 kg/m   BP Readings from Last 3 Encounters:  02/10/23 (!) 157/73  02/03/23 119/73  12/25/22 (!) 150/75    Wt Readings from Last 3 Encounters:  02/10/23 176 lb 6.4 oz (80 kg)  02/03/23 177 lb 7.5 oz (80.5 kg)  12/25/22 180 lb 3.2 oz (81.7 kg)     Physical Exam Vitals reviewed.  Constitutional:      Appearance: He is well-developed.  HENT:     Head: Normocephalic and atraumatic.     Right Ear: External ear normal.     Left Ear: External ear normal.     Mouth/Throat:     Pharynx: No oropharyngeal exudate or posterior oropharyngeal erythema.  Eyes:  Pupils: Pupils are equal, round, and reactive to light.  Cardiovascular:     Rate and Rhythm: Normal rate. Rhythm irregular.     Heart sounds: No murmur heard. Pulmonary:     Effort: No respiratory distress.     Breath sounds: Normal breath sounds.  Musculoskeletal:     Cervical back: Normal range of motion and neck supple.     Right lower leg: Edema (2+) present.     Left lower leg: Edema (2+) present.  Skin:    General: Skin is warm and dry.  Neurological:     Mental Status: He is alert and oriented to person, place, and time.       Assessment & Plan:   Garney was seen today for hospitalization follow-up.  Diagnoses and all orders for this visit:  Acute diastolic heart failure (HCC)  Chronic diastolic heart failure (HCC) -     CBC with  Differential/Platelet -     CMP14+EGFR -     Prealbumin -     Brain natriuretic peptide  Acute on chronic congestive heart failure, unspecified heart failure type (HCC)  Permanent atrial fibrillation (HCC)  Stage 3b chronic kidney disease (CKD) (HCC)  Other orders -     nabumetone (RELAFEN) 500 MG tablet; Take 1 tablet (500 mg total) by mouth 2 (two) times daily. TAKE 2 TABLETS BY MOUTH TWICE DAILY AS NEEDED FOR PAIN. -     torsemide (DEMADEX) 20 MG tablet; Take 2 tablets (40 mg total) by mouth 2 (two) times daily with breakfast and lunch.       I have changed Philis Kendall. Kerman's nabumetone and torsemide. I am also having him maintain his fish oil-omega-3 fatty acids, Multiple Vitamins-Minerals (EYE VITAMINS & MINERALS PO), Vitamin D (Ergocalciferol), OVER THE COUNTER MEDICATION, nitroGLYCERIN, potassium chloride SA, traMADol, acetaminophen, OVER THE COUNTER MEDICATION, and metolazone.  Allergies as of 02/10/2023   No Known Allergies      Medication List        Accurate as of February 10, 2023  7:06 PM. If you have any questions, ask your nurse or doctor.          acetaminophen 500 MG tablet Commonly known as: TYLENOL Take 2 tablets (1,000 mg total) by mouth 3 (three) times daily. For pain   EYE VITAMINS & MINERALS PO Take by mouth. [employer]   fish oil-omega-3 fatty acids 1000 MG capsule Take 2 g by mouth daily.   metolazone 2.5 MG tablet Commonly known as: ZAROXOLYN Take 1 tablet (2.5 mg total) by mouth 3 (three) times a week. Take every Monday, Wednesday, and Friday   nabumetone 500 MG tablet Commonly known as: RELAFEN Take 1 tablet (500 mg total) by mouth 2 (two) times daily. TAKE 2 TABLETS BY MOUTH TWICE DAILY AS NEEDED FOR PAIN. What changed: See the new instructions. Changed by: Ernesto Zukowski   nitroGLYCERIN 0.4 MG SL tablet Commonly known as: NITROSTAT Place 1 tablet (0.4 mg total) under the tongue every 5 (five) minutes x 3 doses as needed for  chest pain (if no relief after 3rd dose, proceed to ED or call 911).   OVER THE COUNTER MEDICATION Take 1 tablet by mouth daily. Vitamin B12   OVER THE COUNTER MEDICATION Take 1 tablet by mouth daily. Eye vitamin   potassium chloride SA 20 MEQ tablet Commonly known as: KLOR-CON M Take 2 tablets (40 mEq total) by mouth every morning AND 1 tablet (20 mEq total) every evening.   torsemide 20 MG  tablet Commonly known as: DEMADEX Take 2 tablets (40 mg total) by mouth 2 (two) times daily with breakfast and lunch. What changed: when to take this Changed by: Madiline Saffran   traMADol 50 MG tablet Commonly known as: ULTRAM Take 1 tablet (50 mg total) by mouth 4 (four) times daily as needed for moderate pain.   Vitamin D (Ergocalciferol) 1.25 MG (50000 UNIT) Caps capsule Commonly known as: DRISDOL TAKE 1 CAPSULE BY MOUTH ONCE EVERY 7 DAYS. What changed: See the new instructions.         Follow-up: Return in about 1 month (around 03/13/2023) for CHF.  Mechele Claude, M.D.

## 2023-02-11 LAB — CMP14+EGFR
ALT: 12 [IU]/L (ref 0–44)
AST: 20 [IU]/L (ref 0–40)
Albumin: 3.1 g/dL — ABNORMAL LOW (ref 3.6–4.6)
Alkaline Phosphatase: 77 [IU]/L (ref 44–121)
BUN/Creatinine Ratio: 24 (ref 10–24)
BUN: 35 mg/dL (ref 10–36)
Bilirubin Total: 0.2 mg/dL (ref 0.0–1.2)
CO2: 29 mmol/L (ref 20–29)
Calcium: 9.9 mg/dL (ref 8.6–10.2)
Chloride: 101 mmol/L (ref 96–106)
Creatinine, Ser: 1.46 mg/dL — ABNORMAL HIGH (ref 0.76–1.27)
Globulin, Total: 2.7 g/dL (ref 1.5–4.5)
Glucose: 85 mg/dL (ref 70–99)
Potassium: 4.4 mmol/L (ref 3.5–5.2)
Sodium: 142 mmol/L (ref 134–144)
Total Protein: 5.8 g/dL — ABNORMAL LOW (ref 6.0–8.5)
eGFR: 44 mL/min/{1.73_m2} — ABNORMAL LOW (ref >59)

## 2023-02-11 LAB — CBC WITH DIFFERENTIAL/PLATELET
Basophils Absolute: 0 10*3/uL (ref 0.0–0.2)
Basos: 1 %
EOS (ABSOLUTE): 0.1 10*3/uL (ref 0.0–0.4)
Eos: 3 %
Hematocrit: 32.5 % — ABNORMAL LOW (ref 37.5–51.0)
Hemoglobin: 11.3 g/dL — ABNORMAL LOW (ref 13.0–17.7)
Immature Grans (Abs): 0 10*3/uL (ref 0.0–0.1)
Immature Granulocytes: 0 %
Lymphocytes Absolute: 1.1 10*3/uL (ref 0.7–3.1)
Lymphs: 22 %
MCH: 37.2 pg — ABNORMAL HIGH (ref 26.6–33.0)
MCHC: 34.8 g/dL (ref 31.5–35.7)
MCV: 107 fL — ABNORMAL HIGH (ref 79–97)
Monocytes Absolute: 0.6 10*3/uL (ref 0.1–0.9)
Monocytes: 12 %
Neutrophils Absolute: 3.2 10*3/uL (ref 1.4–7.0)
Neutrophils: 62 %
Platelets: 230 10*3/uL (ref 150–450)
RBC: 3.04 x10E6/uL — ABNORMAL LOW (ref 4.14–5.80)
RDW: 13.1 % (ref 11.6–15.4)
WBC: 5.1 10*3/uL (ref 3.4–10.8)

## 2023-02-11 LAB — BRAIN NATRIURETIC PEPTIDE: BNP: 551.6 pg/mL — ABNORMAL HIGH (ref 0.0–100.0)

## 2023-02-11 LAB — PREALBUMIN: PREALBUMIN: 11 mg/dL (ref 9–32)

## 2023-02-12 ENCOUNTER — Other Ambulatory Visit: Payer: Self-pay

## 2023-02-12 NOTE — Patient Instructions (Signed)
Visit Information  Thank you for taking time to visit with me today. Please don't hesitate to contact me if I can be of assistance to you before our next scheduled telephone appointment.  Our next appointment is by telephone on February 19, 2023 at 11:00am  Following is a copy of your care plan:   Goals Addressed             This Visit's Progress    Patient Stated       Current Barriers:  Chronic Disease Management support and education needs related to CHF   RNCM Clinical Goal(s):  Patient will work with the Care Management team over the next 30 days to address Transition of Care Barriers: Medication Management Diet/Nutrition/Food Resources Support at home Provider appointments Home Health services verbalize understanding of plan for management of CHF as evidenced by no readmission to the hospital in the next 30 days  through collaboration with RN Care manager, provider, and care team.   Interventions: Evaluation of current treatment plan related to  self management and patient's adherence to plan as established by provider   Heart Failure Interventions:  (Status:  New goal.) Short Term Goal Advised patient to weigh each morning after emptying bladder Discussed importance of daily weight and advised patient to weigh and record daily Reviewed role of diuretics in prevention of fluid overload and management of heart failure; Discussed the importance of keeping all appointments with provider  Patient Goals/Self-Care Activities: Participate in Transition of Care Program/Attend [facility] scheduled calls Notify RN Care Manager of [facility] call rescheduling needs Take all medications as prescribed Attend all scheduled provider appointments  Follow Up Plan:  Telephone follow up appointment with care management team member scheduled for:  02/19/23        The patient verbalized understanding of instructions, educational materials, and care plan provided today and agreed to receive a mailed  copy of patient instructions, educational materials, and care plan.   The care management team will reach out to the patient again over the next 7 days.   Please call the care guide team at [phone] if you need to cancel or reschedule your appointment.   Deidre Ala, RN Medical illustrator [facility] [phone]

## 2023-02-12 NOTE — Patient Outreach (Signed)
Care Management  Transitions of Care Program Transitions of Care Post-discharge week 2   02/12/2023 Name: Brett Blankenship MRN: [id number] DOB: [birth year]  Subjective: DEMARKUS Blankenship is a 89 y.o. year old male who is a primary care patient of Stacks, Broadus [name], MD. The Care Management team Engaged with patient Engaged with patient by telephone to assess and address transitions of care needs.   Consent to Services:  Patient was given information about care management services, agreed to services, and gave verbal consent to participate.   Assessment:           SDOH Interventions    Flowsheet Row Patient Outreach from 02/12/2023 in [place] [facility] Telephone from 02/05/2023 in [place] [facility]PART[facility] Telephone from 05/31/2022 in [facility] Community Care Coordination Office Visit from 05/23/2022 in [facility] Clinical Support from 03/01/2022 in [facility] Clinical Support from 02/28/2021 in [facility]  SDOH Interventions        Food Insecurity Interventions Intervention Not Indicated Intervention Not Indicated Intervention Not Indicated -- Intervention Not Indicated Intervention Not Indicated  Housing Interventions -- -- -- -- Intervention Not Indicated Intervention Not Indicated  Transportation Interventions Intervention Not Indicated Intervention Not Indicated Intervention Not Indicated -- Intervention Not Indicated Intervention Not Indicated  Depression Interventions/Treatment  -- -- -- Patient refuses Treatment -- --  Financial Strain Interventions -- -- -- -- Intervention Not Indicated Intervention Not Indicated  Physical Activity Interventions -- -- -- -- Intervention Not Indicated Intervention Not Indicated  Stress Interventions -- -- -- -- Intervention Not Indicated Intervention Not Indicated  Social Connections Interventions  -- -- -- -- Intervention Not Indicated Intervention Not Indicated        Goals Addressed             This Visit's Progress    Patient Stated       Current Barriers:  Chronic Disease Management support and education needs related to CHF   RNCM Clinical Goal(s):  Patient will work with the Care Management team over the next 30 days to address Transition of Care Barriers: Medication Management Diet/Nutrition/Food Resources Support at home Provider appointments Home Health services verbalize understanding of plan for management of CHF as evidenced by no readmission to the hospital in the next 30 days  through collaboration with RN Care manager, provider, and care team.   Interventions: Evaluation of current treatment plan related to  self management and patient's adherence to plan as established by provider   Heart Failure Interventions:  (Status:  New goal.) Short Term Goal Advised patient to weigh each morning after emptying bladder Discussed importance of daily weight and advised patient to weigh and record daily Reviewed role of diuretics in prevention of fluid overload and management of heart failure; Discussed the importance of keeping all appointments with provider  Patient Goals/Self-Care Activities: Participate in Transition of Care Program/Attend [facility] scheduled calls Notify RN Care Manager of [facility] call rescheduling needs Take all medications as prescribed Attend all scheduled provider appointments  Follow Up Plan:  Telephone follow up appointment with care management team member scheduled for:  02/19/23        Plan: Telephone follow up appointment with care management team member scheduled for: February 19, 2023  Deidre Ala, RN RN Care Manager [facility] [phone]

## 2023-02-13 DIAGNOSIS — N1832 Chronic kidney disease, stage 3b: Secondary | ICD-10-CM | POA: Diagnosis not present

## 2023-02-13 DIAGNOSIS — Z9181 History of falling: Secondary | ICD-10-CM | POA: Diagnosis not present

## 2023-02-13 DIAGNOSIS — D539 Nutritional anemia, unspecified: Secondary | ICD-10-CM | POA: Diagnosis not present

## 2023-02-13 DIAGNOSIS — Z87440 Personal history of urinary (tract) infections: Secondary | ICD-10-CM | POA: Diagnosis not present

## 2023-02-13 DIAGNOSIS — D631 Anemia in chronic kidney disease: Secondary | ICD-10-CM | POA: Diagnosis not present

## 2023-02-13 DIAGNOSIS — I130 Hypertensive heart and chronic kidney disease with heart failure and stage 1 through stage 4 chronic kidney disease, or unspecified chronic kidney disease: Secondary | ICD-10-CM | POA: Diagnosis not present

## 2023-02-13 DIAGNOSIS — I352 Nonrheumatic aortic (valve) stenosis with insufficiency: Secondary | ICD-10-CM | POA: Diagnosis not present

## 2023-02-13 DIAGNOSIS — E782 Mixed hyperlipidemia: Secondary | ICD-10-CM | POA: Diagnosis not present

## 2023-02-13 DIAGNOSIS — K862 Cyst of pancreas: Secondary | ICD-10-CM | POA: Diagnosis not present

## 2023-02-13 DIAGNOSIS — Z87891 Personal history of nicotine dependence: Secondary | ICD-10-CM | POA: Diagnosis not present

## 2023-02-13 DIAGNOSIS — I5033 Acute on chronic diastolic (congestive) heart failure: Secondary | ICD-10-CM | POA: Diagnosis not present

## 2023-02-13 DIAGNOSIS — D696 Thrombocytopenia, unspecified: Secondary | ICD-10-CM | POA: Diagnosis not present

## 2023-02-13 DIAGNOSIS — Z79899 Other long term (current) drug therapy: Secondary | ICD-10-CM | POA: Diagnosis not present

## 2023-02-13 DIAGNOSIS — I4819 Other persistent atrial fibrillation: Secondary | ICD-10-CM | POA: Diagnosis not present

## 2023-02-17 ENCOUNTER — Ambulatory Visit: Payer: Medicare Other

## 2023-02-17 DIAGNOSIS — N1832 Chronic kidney disease, stage 3b: Secondary | ICD-10-CM | POA: Diagnosis not present

## 2023-02-17 DIAGNOSIS — E782 Mixed hyperlipidemia: Secondary | ICD-10-CM

## 2023-02-17 DIAGNOSIS — K862 Cyst of pancreas: Secondary | ICD-10-CM

## 2023-02-17 DIAGNOSIS — I130 Hypertensive heart and chronic kidney disease with heart failure and stage 1 through stage 4 chronic kidney disease, or unspecified chronic kidney disease: Secondary | ICD-10-CM

## 2023-02-17 DIAGNOSIS — I5033 Acute on chronic diastolic (congestive) heart failure: Secondary | ICD-10-CM | POA: Diagnosis not present

## 2023-02-17 DIAGNOSIS — D631 Anemia in chronic kidney disease: Secondary | ICD-10-CM

## 2023-02-17 DIAGNOSIS — I352 Nonrheumatic aortic (valve) stenosis with insufficiency: Secondary | ICD-10-CM

## 2023-02-17 DIAGNOSIS — D696 Thrombocytopenia, unspecified: Secondary | ICD-10-CM

## 2023-02-17 DIAGNOSIS — D539 Nutritional anemia, unspecified: Secondary | ICD-10-CM

## 2023-02-17 DIAGNOSIS — I4819 Other persistent atrial fibrillation: Secondary | ICD-10-CM

## 2023-02-19 ENCOUNTER — Other Ambulatory Visit: Payer: Self-pay

## 2023-02-19 NOTE — Patient Outreach (Signed)
Care Management  Transitions of Care Program Transitions of Care Post-discharge week 3   02/19/2023 Name: Brett Blankenship MRN: [id number] DOB: [birth year]  Subjective: Brett Blankenship is a 89 y.o. year old male who is a primary care patient of Stacks, Broadus [name], MD. The Care Management team Engaged with patient Engaged with patient by telephone to assess and address transitions of care needs.   Consent to Services:  Patient was given information about care management services, agreed to services, and gave verbal consent to participate.   Assessment:           SDOH Interventions    Flowsheet Row Patient Outreach from 02/19/2023 in [place] [facility] DEPART[facility] Patient Outreach from 02/12/2023 in [place] [facility]ATION [facility] DEPARTMENT Telephone from 02/05/2023 in [place] POP[facility]ATION [facility] DEPARTMENT Telephone from 05/31/2022 in [facility] Community Care Coordination Office Visit from 05/23/2022 in [facility] Clinical Support from 03/01/2022 in [facility]  SDOH Interventions        Food Insecurity Interventions Intervention Not Indicated Intervention Not Indicated Intervention Not Indicated Intervention Not Indicated -- Intervention Not Indicated  Housing Interventions -- -- -- -- -- Intervention Not Indicated  Transportation Interventions Intervention Not Indicated Intervention Not Indicated Intervention Not Indicated Intervention Not Indicated -- Intervention Not Indicated  Depression Interventions/Treatment  -- -- -- -- Patient refuses Treatment --  Financial Strain Interventions -- -- -- -- -- Intervention Not Indicated  Physical Activity Interventions -- -- -- -- -- Intervention Not Indicated  Stress Interventions -- -- -- -- -- Intervention Not Indicated  Social Connections Interventions -- -- -- -- -- Intervention Not Indicated        Goals Addressed             This Visit's  Progress    Patient Stated       Current Barriers:  Chronic Disease Management support and education needs related to CHF   RNCM Clinical Goal(s):  Patient will work with the Care Management team over the next 30 days to address Transition of Care Barriers: Home Health services through collaboration with RN Care manager, provider, and care team.   Interventions: Evaluation of current treatment plan related to  self management and patient's adherence to plan as established by provider   Heart Failure Interventions:  (Status:  New goal.) Short Term Goal Reviewed role of diuretics in prevention of fluid overload and management of heart failure; Discussed the importance of keeping all appointments with provider Advised patient to discuss Code status and Palliative Care/Hospice with provider  Patient Goals/Self-Care Activities: Participate in Transition of Care Program/Attend [facility] scheduled calls Notify RN Care Manager of [facility] call rescheduling needs Take all medications as prescribed Attend all scheduled provider appointments  Follow Up Plan:  Telephone follow up appointment with care management team member scheduled for:  October 30 at 11:00am The patient has been provided with contact information for the care management team and has been advised to call with any health related questions or concerns.    Deidre Ala, RN RN Care Manager [facility] [phone]         Plan: Telephone follow up appointment with care management team member scheduled for:  October 30,2024 at 11:00am. This will be the last week of the 30 day Outreach program. Will discuss with the patient's son the transfer to Longitudinal Team  Deidre Ala, Programmer, systems [facility] [phone]

## 2023-02-20 ENCOUNTER — Telehealth: Payer: Self-pay | Admitting: Internal Medicine

## 2023-02-20 ENCOUNTER — Encounter: Payer: Self-pay | Admitting: Internal Medicine

## 2023-02-20 ENCOUNTER — Ambulatory Visit: Payer: Medicare Other | Attending: Internal Medicine | Admitting: Internal Medicine

## 2023-02-20 VITALS — BP 134/70 | HR 86 | Ht 69.0 in | Wt 168.0 lb

## 2023-02-20 DIAGNOSIS — I350 Nonrheumatic aortic (valve) stenosis: Secondary | ICD-10-CM | POA: Insufficient documentation

## 2023-02-20 DIAGNOSIS — Z5309 Procedure and treatment not carried out because of other contraindication: Secondary | ICD-10-CM | POA: Diagnosis not present

## 2023-02-20 DIAGNOSIS — I4821 Permanent atrial fibrillation: Secondary | ICD-10-CM

## 2023-02-20 DIAGNOSIS — Z8679 Personal history of other diseases of the circulatory system: Secondary | ICD-10-CM | POA: Diagnosis not present

## 2023-02-20 DIAGNOSIS — I5032 Chronic diastolic (congestive) heart failure: Secondary | ICD-10-CM | POA: Diagnosis not present

## 2023-02-20 DIAGNOSIS — I4819 Other persistent atrial fibrillation: Secondary | ICD-10-CM

## 2023-02-20 MED ORDER — METOLAZONE 2.5 MG PO TABS: 2.5000 mg | ORAL_TABLET | ORAL | 3 refills | Status: DC

## 2023-02-20 NOTE — Progress Notes (Signed)
Cardiology Office Note  Date: 02/20/2023   ID: Brett Blankenship, DOB 08/24/1926, MRN [id number]  PCP:  Brett Claude, MD  Cardiologist:  Brett Bicker, MD Electrophysiologist:  None    History of Present Illness: Brett Blankenship is a 89 y.o. male with atrial fibrillation not on [facility] due to ICH, valvular heart disease (possible severe AI and mild AS on 05/28/2022), ascending aorta dilatation 4.9 cm presented to cardiology clinic for follow-up visit.   Patient had multiple ER and hospital admissions since 04/2022 for ADHF and falls. Eliquis was discontinued in 08/2022 due to fall resulting in ICH/SDH.  He underwent a CT angio PE in 09/24/2022 that showed moderate bilateral pleural effusions. P.o. Lasix was switched to torsemide 40 mg twice daily as he was not improving on Lasix.  He underwent thoracentesis yielding 800 cc of pleural fluid on the right side after which his symptoms improved.  He recently had another hospital mission for ADHF, diuresed and stable upon discharge.  He is here for follow-up visit.  He is compensated, no DOE, orthopnea, PND but complains of mild SOB.  He also had no abdomen last few weeks and had decreased p.o. intake.  Was feeling tired hands.  He also has bilateral hip pain waking him up from sleep.  He is currently on pain medications but not helping him.  No symptoms of dizziness, syncope, leg swelling.  Past Medical History:  Diagnosis Date   Aortic regurgitation    Atrial fibrillation (HCC)    C7 cervical fracture (HCC)    CHF (congestive heart failure) (HCC)    HYPERTENSION 07/12/2009    Past Surgical History:  Procedure Laterality Date   APPENDECTOMY  1960   NECK SURGERY      Current Outpatient Medications  Medication Sig Dispense Refill   acetaminophen (TYLENOL) 500 MG tablet Take 2 tablets (1,000 mg total) by mouth 3 (three) times daily. For pain 180 tablet PRN   fish oil-omega-3 fatty acids 1000 MG capsule Take 2 g by mouth daily.      [START ON 02/21/2023] metolazone (ZAROXOLYN) 2.5 MG tablet Take 1 tablet (2.5 mg total) by mouth 3 (three) times a week. Take every Monday, Wednesday, and Friday 30 tablet 3   Multiple Vitamins-Minerals (EYE VITAMINS & MINERALS PO) Take by mouth. [employer]     nabumetone (RELAFEN) 500 MG tablet Take 1 tablet (500 mg total) by mouth 2 (two) times daily. TAKE 2 TABLETS BY MOUTH TWICE DAILY AS NEEDED FOR PAIN. 180 tablet 0   nitroGLYCERIN (NITROSTAT) 0.4 MG SL tablet Place 1 tablet (0.4 mg total) under the tongue every 5 (five) minutes x 3 doses as needed for chest pain (if no relief after 3rd dose, proceed to ED or call 911). 25 tablet 3   OVER THE COUNTER MEDICATION Take 1 tablet by mouth daily. Vitamin B12     OVER THE COUNTER MEDICATION Take 1 tablet by mouth daily. Eye vitamin     potassium chloride SA (KLOR-CON M) 20 MEQ tablet Take 2 tablets (40 mEq total) by mouth every morning AND 1 tablet (20 mEq total) every evening. 270 tablet 3   torsemide (DEMADEX) 20 MG tablet Take 2 tablets (40 mg total) by mouth 2 (two) times daily with breakfast and lunch. 360 tablet 1   traMADol (ULTRAM) 50 MG tablet Take 1 tablet (50 mg total) by mouth 4 (four) times daily as needed for moderate pain. 50 tablet 0   Vitamin D, Ergocalciferol, (DRISDOL) 1.25  MG (50000 UNIT) CAPS capsule TAKE 1 CAPSULE BY MOUTH ONCE EVERY 7 DAYS. (Patient taking differently: Take 50,000 Units by mouth every 7 (seven) days. Takes on Sunday) 13 capsule 1   No current facility-administered medications for this visit.   Allergies:  Patient has no known allergies.   Social History: The patient  reports that he quit smoking about 59 years ago. His smoking use included cigarettes. He started smoking about 64 years ago. He has a 1.5 pack-year smoking history. He has never used smokeless tobacco. He reports that he does not currently use alcohol. He reports that he does not currently use drugs.   Family History: The patient's family history  includes Cancer in his brother; Heart disease in his father; Hypertension in his father.   ROS:  Please see the history of present illness. Otherwise, complete review of systems is positive for none  All other systems are reviewed and negative.   Physical Exam: VS:  BP 134/70   Pulse 86   Ht 5\' 9"  (1.753 m)   Wt 168 lb (76.2 kg)   SpO2 95%   BMI 24.81 kg/m , BMI Body mass index is 24.81 kg/m.  Wt Readings from Last 3 Encounters:  02/20/23 168 lb (76.2 kg)  02/10/23 176 lb 6.4 oz (80 kg)  02/03/23 177 lb 7.5 oz (80.5 kg)    General: Patient appears comfortable at rest. HEENT: Conjunctiva and lids normal, oropharynx clear with moist mucosa. Neck: Supple, no elevated JVP or carotid bruits, no thyromegaly. Lungs: Clear to auscultation, nonlabored breathing at rest. Cardiac: Regular rate and rhythm, no S3 or significant systolic murmur, no pericardial rub. Abdomen: Soft, nontender, no hepatomegaly, bowel sounds present, no guarding or rebound. Extremities: 1+ pitting edema around the ankles but no edema in the shins Skin: Warm and dry. Musculoskeletal: No kyphosis. Neuropsychiatric: Alert and oriented x3, affect grossly appropriate.  Recent Labwork: 05/27/2022: TSH 1.180 02/03/2023: Magnesium 1.8 02/10/2023: ALT 12; AST 20; BNP 551.6; BUN 35; Creatinine, Ser 1.46; Hemoglobin 11.3; Platelets 230; Potassium 4.4; Sodium 142     Component Value Date/Time   CHOL 98 (L) 04/18/2022 1018   TRIG 121 04/18/2022 1018   HDL 26 (L) 04/18/2022 1018   CHOLHDL 3.8 04/18/2022 1018   LDLCALC 50 04/18/2022 1018    Assessment and Plan:  Patient is a 89 year old M known to have valvular heart disease (possible severe aortic valve regurgitation and mild aortic stenosis on 05/28/2022), persistent A-fib, ascending aorta dilatation 4.5 cm in 5/24 he is here for follow-up visit.   Chronic diastolic heart failure Possible severe eccentric aortic valve regurgitation Aorta dilatation (Ascending aorta  49 mm and aortic root 43 mm) Mild aortic valve stenosis Persistent A-fib History of ICH   -Patient's main complaint today is his bilateral hip pain and decreased p.o. intake from absence of appetite in the last few weeks.  He does feel tired and has mild SOB probably from decreased p.o. intake.  Otherwise he is compensated from heart failure standpoint. Will get BNP. I instructed him to follow-up with PCP for hip arthritis management and for appetite stimulants. -Continue torsemide 40 mg twice daily along with potassium supplements.  Continue metolazone 2.5 mg 3 times weekly. -He does have a ascending aorta dilatation 49 mm with possible severe eccentric aortic valve regurgitation, not a candidate for surgical intervention due to advanced age. Refused TEE in the past. -He had a fall resulting in intracranial hemorrhage in 08/2022, Eliquis discontinued, patient refused watchman.  I spent  a total duration of 30 minutes during the prior medical notes, EKG, labs, face-to-face discussion/counseling of his medical condition, pathophysiology, evaluation, management, ordering labs and documenting the findings in the note.   Disposition:  Follow up  6 months  Signed Shahed Yeoman Verne Spurr, MD, 02/20/2023 12:53 PM    [facility] [address] [place], [place], [place] [address]

## 2023-02-20 NOTE — Telephone Encounter (Signed)
*  STAT* If patient is at the pharmacy, call can be transferred to refill team.   1. Which medications need to be refilled? (please list name of each medication and dose if known)   metolazone (ZAROXOLYN) 2.5 MG tablet      4. Which pharmacy/location (including street and city if local pharmacy) is medication to be sent to? [facility] - [address] [place], [place] - [address] Phone: [phone]  Fax: [phone]       5. Do they need a 30 day or 90 day supply? 90

## 2023-02-20 NOTE — Telephone Encounter (Signed)
Medication refill request completed- Ok to refill per provider Lenord Carbo, MD

## 2023-02-20 NOTE — Patient Instructions (Addendum)
Medication Instructions:  Your physician recommends that you continue on your current medications as directed. Please refer to the Current Medication list given to you today.   Labwork: BNP to be done today at [facility] or [facility]  Testing/Procedures: None  Follow-Up: Your physician recommends that you schedule a follow-up appointment in: 6 months  Any Other Special Instructions Will Be Listed Below (If Applicable).  Thank you for choosing [facility]!      If you need a refill on your cardiac medications before your next appointment, please call your pharmacy.

## 2023-02-22 ENCOUNTER — Other Ambulatory Visit: Payer: Self-pay | Admitting: Family Medicine

## 2023-02-24 ENCOUNTER — Other Ambulatory Visit: Payer: Self-pay

## 2023-02-24 ENCOUNTER — Telehealth: Payer: Self-pay

## 2023-02-24 ENCOUNTER — Other Ambulatory Visit: Payer: Self-pay | Admitting: Family Medicine

## 2023-02-24 DIAGNOSIS — Z79899 Other long term (current) drug therapy: Secondary | ICD-10-CM

## 2023-02-24 DIAGNOSIS — I4821 Permanent atrial fibrillation: Secondary | ICD-10-CM

## 2023-02-24 DIAGNOSIS — I5032 Chronic diastolic (congestive) heart failure: Secondary | ICD-10-CM

## 2023-02-24 MED ORDER — TORSEMIDE 60 MG PO TABS: 60.0000 mg | ORAL_TABLET | Freq: Two times a day (BID) | ORAL | 2 refills | Status: DC

## 2023-02-24 NOTE — Telephone Encounter (Signed)
Son informed of plan per DPR. Verbalized understanding . Son is asking since his levels are elevated does he need to go back to the hospital? Also does he need to take the medication that was mailed to him at any point because of the increase. Please Advise.

## 2023-02-24 NOTE — Telephone Encounter (Signed)
Per Dr. Jenene Slicker:  Can try Furoscix once daily for 2 days and assess response. If he does not get better by then, prolly better to go to ER. Get BMP and BNP in 5 days.   Spoke with Marcial Pacas (son) verbalized understanding.

## 2023-02-24 NOTE — Telephone Encounter (Signed)
-----   Message from Vishnu P Mallipeddi sent at 02/21/2023 12:10 PM EDT ----- proBNP significantly elevated, 7,313.  Increase torsemide dose from 40 mg to 60 mg twice daily.  Repeat BMP in 5 days.  Schedule follow-up visit in 2 weeks with Sharlene Dory.

## 2023-02-24 NOTE — Addendum Note (Signed)
Addended by: Carmelina Paddock on: 02/24/2023 03:35 PM   Modules accepted: Orders

## 2023-02-25 ENCOUNTER — Observation Stay (HOSPITAL_COMMUNITY)
Admission: EM | Admit: 2023-02-25 | Discharge: 2023-02-26 | Disposition: A | Discharge status: Home / Self Care | Payer: Medicare Other | Attending: Internal Medicine | Admitting: Internal Medicine

## 2023-02-25 ENCOUNTER — Other Ambulatory Visit: Payer: Self-pay

## 2023-02-25 ENCOUNTER — Encounter (HOSPITAL_COMMUNITY): Payer: Self-pay

## 2023-02-25 ENCOUNTER — Emergency Department (HOSPITAL_COMMUNITY): Payer: Medicare Other

## 2023-02-25 DIAGNOSIS — I77819 Aortic ectasia, unspecified site: Secondary | ICD-10-CM | POA: Diagnosis present

## 2023-02-25 DIAGNOSIS — R531 Weakness: Secondary | ICD-10-CM | POA: Diagnosis not present

## 2023-02-25 DIAGNOSIS — N179 Acute kidney failure, unspecified: Secondary | ICD-10-CM | POA: Insufficient documentation

## 2023-02-25 DIAGNOSIS — I5033 Acute on chronic diastolic (congestive) heart failure: Secondary | ICD-10-CM | POA: Diagnosis not present

## 2023-02-25 DIAGNOSIS — N1832 Chronic kidney disease, stage 3b: Secondary | ICD-10-CM | POA: Diagnosis present

## 2023-02-25 DIAGNOSIS — I351 Nonrheumatic aortic (valve) insufficiency: Secondary | ICD-10-CM | POA: Diagnosis not present

## 2023-02-25 DIAGNOSIS — R627 Adult failure to thrive: Principal | ICD-10-CM

## 2023-02-25 DIAGNOSIS — I130 Hypertensive heart and chronic kidney disease with heart failure and stage 1 through stage 4 chronic kidney disease, or unspecified chronic kidney disease: Secondary | ICD-10-CM | POA: Diagnosis not present

## 2023-02-25 DIAGNOSIS — M1990 Unspecified osteoarthritis, unspecified site: Secondary | ICD-10-CM | POA: Insufficient documentation

## 2023-02-25 DIAGNOSIS — Z79899 Other long term (current) drug therapy: Secondary | ICD-10-CM | POA: Diagnosis not present

## 2023-02-25 DIAGNOSIS — I4819 Other persistent atrial fibrillation: Secondary | ICD-10-CM | POA: Diagnosis not present

## 2023-02-25 DIAGNOSIS — E876 Hypokalemia: Secondary | ICD-10-CM | POA: Diagnosis not present

## 2023-02-25 DIAGNOSIS — I5020 Unspecified systolic (congestive) heart failure: Principal | ICD-10-CM

## 2023-02-25 DIAGNOSIS — I509 Heart failure, unspecified: Secondary | ICD-10-CM | POA: Diagnosis not present

## 2023-02-25 DIAGNOSIS — Z66 Do not resuscitate: Secondary | ICD-10-CM | POA: Diagnosis not present

## 2023-02-25 DIAGNOSIS — I700 Atherosclerosis of aorta: Secondary | ICD-10-CM | POA: Diagnosis not present

## 2023-02-25 DIAGNOSIS — Z87891 Personal history of nicotine dependence: Secondary | ICD-10-CM | POA: Insufficient documentation

## 2023-02-25 DIAGNOSIS — I5030 Unspecified diastolic (congestive) heart failure: Secondary | ICD-10-CM | POA: Diagnosis not present

## 2023-02-25 DIAGNOSIS — D539 Nutritional anemia, unspecified: Secondary | ICD-10-CM | POA: Diagnosis present

## 2023-02-25 DIAGNOSIS — Z8679 Personal history of other diseases of the circulatory system: Secondary | ICD-10-CM

## 2023-02-25 DIAGNOSIS — I110 Hypertensive heart disease with heart failure: Secondary | ICD-10-CM | POA: Diagnosis not present

## 2023-02-25 LAB — CBC
HCT: 35.3 % — ABNORMAL LOW (ref 39.0–52.0)
Hemoglobin: 11.8 g/dL — ABNORMAL LOW (ref 13.0–17.0)
MCH: 35.8 pg — ABNORMAL HIGH (ref 26.0–34.0)
MCHC: 33.4 g/dL (ref 30.0–36.0)
MCV: 107 fL — ABNORMAL HIGH (ref 80.0–100.0)
Platelets: 147 10*3/uL — ABNORMAL LOW (ref 150–400)
RBC: 3.3 MIL/uL — ABNORMAL LOW (ref 4.22–5.81)
RDW: 14.1 % (ref 11.5–15.5)
WBC: 5 10*3/uL (ref 4.0–10.5)
nRBC: 0 % (ref 0.0–0.2)

## 2023-02-25 LAB — URINALYSIS, ROUTINE W REFLEX MICROSCOPIC
Bilirubin Urine: NEGATIVE
Glucose, UA: NEGATIVE mg/dL
Hgb urine dipstick: NEGATIVE
Ketones, ur: NEGATIVE mg/dL
Leukocytes,Ua: NEGATIVE
Nitrite: NEGATIVE
Protein, ur: NEGATIVE mg/dL
Specific Gravity, Urine: 1.014 (ref 1.005–1.030)
pH: 6 (ref 5.0–8.0)

## 2023-02-25 LAB — BASIC METABOLIC PANEL
Anion gap: 13 (ref 5–15)
BUN: 45 mg/dL — ABNORMAL HIGH (ref 8–23)
CO2: 27 mmol/L (ref 22–32)
Calcium: 9.6 mg/dL (ref 8.9–10.3)
Chloride: 95 mmol/L — ABNORMAL LOW (ref 98–111)
Creatinine, Ser: 1.33 mg/dL — ABNORMAL HIGH (ref 0.61–1.24)
GFR, Estimated: 50 mL/min — ABNORMAL LOW (ref >60)
Glucose, Bld: 93 mg/dL (ref 70–99)
Potassium: 2.9 mmol/L — ABNORMAL LOW (ref 3.5–5.1)
Sodium: 135 mmol/L (ref 135–145)

## 2023-02-25 LAB — BRAIN NATRIURETIC PEPTIDE: B Natriuretic Peptide: 382 pg/mL — ABNORMAL HIGH (ref 0.0–100.0)

## 2023-02-25 MED ORDER — ACETAMINOPHEN 500 MG PO TABS
1000.0000 mg | ORAL_TABLET | Freq: Three times a day (TID) | ORAL | Status: DC
  Administered 2023-02-25 – 2023-02-26 (×3): 1000 mg via ORAL
  Filled 2023-02-25 (×3): qty 2

## 2023-02-25 MED ORDER — POTASSIUM CHLORIDE CRYS ER 20 MEQ PO TBCR
40.0000 meq | EXTENDED_RELEASE_TABLET | Freq: Once | ORAL | Status: AC
  Administered 2023-02-25: 40 meq via ORAL
  Filled 2023-02-25: qty 2

## 2023-02-25 MED ORDER — POTASSIUM CHLORIDE 10 MEQ/100ML IV SOLN
10.0000 meq | Freq: Once | INTRAVENOUS | Status: AC
  Administered 2023-02-25: 10 meq via INTRAVENOUS
  Filled 2023-02-25: qty 100

## 2023-02-25 MED ORDER — MAGNESIUM SULFATE 2 GM/50ML IV SOLN
2.0000 g | Freq: Once | INTRAVENOUS | Status: AC
  Administered 2023-02-25: 2 g via INTRAVENOUS

## 2023-02-25 MED ORDER — TRAMADOL HCL 50 MG PO TABS: 50.0000 mg | ORAL_TABLET | Freq: Four times a day (QID) | ORAL | Status: DC | PRN

## 2023-02-25 MED ORDER — SODIUM CHLORIDE 0.9% FLUSH
3.0000 mL | Freq: Two times a day (BID) | INTRAVENOUS | Status: DC
  Administered 2023-02-25 – 2023-02-26 (×2): 3 mL via INTRAVENOUS

## 2023-02-25 MED ORDER — NITROGLYCERIN 0.4 MG SL SUBL: 0.4000 mg | SUBLINGUAL_TABLET | SUBLINGUAL | Status: DC | PRN

## 2023-02-25 NOTE — ED Triage Notes (Signed)
C/o waking up with weakness.  Family reports that his BNP is elevated and his torsemide was increased from 40mg  to 60mg  bid last night.  Hx CHF.  Denies sob/cp

## 2023-02-25 NOTE — ED Provider Notes (Signed)
B[facility] Blankens[facility] EMERGENCY DEPARTMENT AT [facility] Provider Note   CSN: [id number] Arrival date & time: 02/25/23  2440     History {Add pertinent medical, surgical, social history, OB history to HPI:1} Chief Complaint  Patient presents with   Weakness    Brett Blankenship is a 89 y.o. male.  Patient is 89 years old and has been complaining of weakness and not eating much.  He has a history of congestive heart failure and atrial fibs.  He saw his cardiologist last week and they increased his Lasix.   Weakness      Home Medications Prior to Admission medications   Medication Sig Start Date End Date Taking? Authorizing Provider  acetaminophen (TYLENOL) 500 MG tablet Take 2 tablets (1,000 mg total) by mouth 3 (three) times daily. For pain 12/25/22  Yes Stacks, Broadus [name], MD  fish oil-omega-3 fatty acids 1000 MG capsule Take 2 g by mouth daily.   Yes [provider]  metolazone (ZAROXOLYN) 2.5 MG tablet Take 1 tablet (2.5 mg total) by mouth 3 (three) times a week. Take every Monday, Wednesday, and Friday 02/21/23  Yes Mallipeddi, Vishnu P, MD  Multiple Vitamins-Minerals (EYE VITAMINS & MINERALS PO) Take by mouth. [facility]   Yes [provider]  nabumetone (RELAFEN) 500 MG tablet TAKE 2 TABLETS BY MOUTH TWICE DAILY AS NEEDED FOR PAIN. Patient taking differently: 1,000 mg 2 (two) times daily. 02/24/23  Yes Stacks, Broadus [name], MD  nitroGLYCERIN (NITROSTAT) 0.4 MG SL tablet Place 1 tablet (0.4 mg total) under the tongue every 5 (five) minutes x 3 doses as needed for chest pain (if no relief after 3rd dose, proceed to ED or call 911). 09/30/22  Yes Mallipeddi, Vishnu P, MD  OVER THE COUNTER MEDICATION Take 1 tablet by mouth daily. Vitamin B12   Yes [provider]  potassium chloride SA (KLOR-CON M) 20 MEQ tablet Take 2 tablets (40 mEq total) by mouth every morning AND 1 tablet (20 mEq total) every evening. 10/16/22  Yes Mallipeddi, Vishnu P, MD  Torsemide 60 MG  TABS Take 60 mg by mouth 2 (two) times daily. 02/24/23  Yes Mallipeddi, Vishnu P, MD  traMADol (ULTRAM) 50 MG tablet Take 1 tablet (50 mg total) by mouth 4 (four) times daily as needed for moderate pain. 12/25/22  Yes Mechele Claude, MD  Vitamin D, Ergocalciferol, (DRISDOL) 1.25 MG (50000 UNIT) CAPS capsule TAKE 1 CAPSULE BY MOUTH ONCE EVERY 7 DAYS. Patient taking differently: Take 50,000 Units by mouth every 7 (seven) days. Takes on Sunday 06/11/22  Yes Mechele Claude, MD      Allergies    Patient has no known allergies.    Review of Systems   Review of Systems  Neurological:  Positive for weakness.    Physical Exam Updated Vital Signs BP 136/74   Pulse 86   Temp 98 F (36.7 C)   Resp 18   Wt 75.8 kg   SpO2 96%   BMI 24.66 kg/m  Physical Exam  ED Results / Procedures / Treatments   Labs (all labs ordered are listed, but only abnormal results are displayed) Labs Reviewed  BASIC METABOLIC PANEL - Abnormal; Notable for the following components:      Result Value   Potassium 2.9 (*)    Chloride 95 (*)    BUN 45 (*)    Creatinine, Ser 1.33 (*)    GFR, Estimated 50 (*)    All other components within normal limits  CBC - Abnormal; Notable  for the following components:   RBC 3.30 (*)    Hemoglobin 11.8 (*)    HCT 35.3 (*)    MCV 107.0 (*)    MCH 35.8 (*)    Platelets 147 (*)    All other components within normal limits  BRAIN NATRIURETIC PEPTIDE - Abnormal; Notable for the following components:   B Natriuretic Peptide 382.0 (*)    All other components within normal limits  URINALYSIS, ROUTINE W REFLEX MICROSCOPIC    EKG EKG Interpretation Date/Time:  Tuesday February 25 2023 11:35:13 EDT Ventricular Rate:  76 PR Interval:    QRS Duration:  140 QT Interval:  430 QTC Calculation: 484 R Axis:   -60  Text Interpretation: Atrial fibrillation Left bundle branch block Confirmed by Bethann Berkshire [phone]) on 02/25/2023 1:26:27 PM  Radiology DG Chest Port 1  View  Result Date: 02/25/2023 CLINICAL DATA:  Weakness. EXAM: PORTABLE CHEST 1 VIEW COMPARISON:  X-ray 01/28/2023. FINDINGS: Calcified aorta. Normal cardiopericardial silhouette. No pneumothorax, effusion or edema. No consolidation. Chronic lung changes identified. Overlapping cardiac leads. Osteopenia. IMPRESSION: Chronic lung changes.  No acute cardiopulmonary disease. Electronically Signed   By: Karen Kays M.D.   On: 02/25/2023 13:27    Procedures Procedures  {Document cardiac monitor, telemetry assessment procedure when appropriate:1}  Medications Ordered in ED Medications  potassium chloride SA (KLOR-CON M) CR tablet 40 mEq (40 mEq Oral Given 02/25/23 1156)  potassium chloride 10 mEq in 100 mL IVPB (10 mEq Intravenous New Bag/Given 02/25/23 1223)    ED Course/ Medical Decision Making/ A&P   {Patient with heart failure and hypokalemia and failure to thrive.  I spoke with Dr. Jenene Slicker and she felt like the patient should be admitted for failure to thrive and controlling his heart failure Click here for ABCD2, HEART and other calculatorsREFRESH Note before signing :1}                              Medical Decision Making Amount and/or Complexity of Data Reviewed Labs: ordered. Radiology: ordered.  Risk Prescription drug management. Decision regarding hospitalization.   CHF and failure to thrive {Document critical care time when appropriate:1} {Document review of labs and clinical decision tools ie heart score, Chads2Vasc2 etc:1}  {Document your independent review of radiology images, and any outside records:1} {Document your discussion with family members, caretakers, and with consultants:1} {Document social determinants of health affecting pt's care:1} {Document your decision making why or why not admission, treatments were needed:1} Final Clinical Impression(s) / ED Diagnoses Final diagnoses:  Systolic congestive heart failure, unspecified HF chronicity (HCC)  Failure  to thrive in adult    Rx / DC Orders ED Discharge Orders     None

## 2023-02-25 NOTE — H&P (Signed)
History and Physical    Patient: Brett Blankenship:[id number] DOB: 1927/04/15 DOA: 02/25/2023 DOS: the patient was seen and examined on 02/25/2023 PCP: Mechele Claude, MD  Patient coming from: Home  Chief Complaint:  Chief Complaint  Patient presents with   Weakness   HPI: Brett Blankenship is a 89 y.o. male with a history of SDH, cervical spine (C7) fracture, HTN, HLD, AFib, chronic HFpEF, severe AI who presented to the ED this morning due to weakness, fatigue, poor oral intake worsening over the course of weeks, gradually. His taste for food has gone, appetite decreased, but still drinks boost BID. He has been trouble by this previously but last night he felt wiped out worse than ever before and so resolved to come in to the ED today. This was in the setting of increasing his torsemide from 40mg  BID to 60mg  BID, per outpatient MD recommendations due to labs indicating overload. He doesn't really go outside like her used to, per his son, hasn't been walking as much inside as before, had severe leg swelling that has actually improved. He denies new chest pain but does get that from time to time. Doesn't really report falls but feels off balance more.   In the ED labs indicated hypokalemia and AKI. He appeared to have edema and dyspnea but no acute CXR findings, no hypoxemia, and the edema was reported to be significantly less than prior. Hospitalist consulted for admission for failure to thrive.   Review of Systems: As mentioned in the history of present illness. All other systems reviewed and are negative. Past Medical History:  Diagnosis Date   Aortic regurgitation    Atrial fibrillation (HCC)    C7 cervical fracture (HCC)    CHF (congestive heart failure) (HCC)    HYPERTENSION 07/12/2009   Past Surgical History:  Procedure Laterality Date   APPENDECTOMY  1960   NECK SURGERY     Social History:  reports that he quit smoking about 59 years ago. His smoking use included  cigarettes. He started smoking about 64 years ago. He has a 1.5 pack-year smoking history. He has never used smokeless tobacco. He reports that he does not currently use alcohol. He reports that he does not currently use drugs.  No Known Allergies  Family History  Problem Relation Age of Onset   Hypertension Father    Heart disease Father    Cancer Brother        colon, lung    Prior to Admission medications   Medication Sig Start Date End Date Taking? Authorizing Provider  acetaminophen (TYLENOL) 500 MG tablet Take 2 tablets (1,000 mg total) by mouth 3 (three) times daily. For pain 12/25/22  Yes Stacks, Broadus [name], MD  fish oil-omega-3 fatty acids 1000 MG capsule Take 2 g by mouth daily.   Yes [provider]  metolazone (ZAROXOLYN) 2.5 MG tablet Take 1 tablet (2.5 mg total) by mouth 3 (three) times a week. Take every Monday, Wednesday, and Friday 02/21/23  Yes Mallipeddi, Vishnu P, MD  Multiple Vitamins-Minerals (EYE VITAMINS & MINERALS PO) Take by mouth. [facility]   Yes [provider]  nabumetone (RELAFEN) 500 MG tablet TAKE 2 TABLETS BY MOUTH TWICE DAILY AS NEEDED FOR PAIN. Patient taking differently: 1,000 mg 2 (two) times daily. 02/24/23  Yes Stacks, Broadus [name], MD  nitroGLYCERIN (NITROSTAT) 0.4 MG SL tablet Place 1 tablet (0.4 mg total) under the tongue every 5 (five) minutes x 3 doses as needed for chest pain (if no relief  after 3rd dose, proceed to ED or call 911). 09/30/22  Yes Mallipeddi, Vishnu P, MD  OVER THE COUNTER MEDICATION Take 1 tablet by mouth daily. Vitamin B12   Yes [provider]  potassium chloride SA (KLOR-CON M) 20 MEQ tablet Take 2 tablets (40 mEq total) by mouth every morning AND 1 tablet (20 mEq total) every evening. 10/16/22  Yes Mallipeddi, Vishnu P, MD  Torsemide 60 MG TABS Take 60 mg by mouth 2 (two) times daily. 02/24/23  Yes Mallipeddi, Vishnu P, MD  traMADol (ULTRAM) 50 MG tablet Take 1 tablet (50 mg total) by mouth 4 (four) times daily as  needed for moderate pain. 12/25/22  Yes Mechele Claude, MD  Vitamin D, Ergocalciferol, (DRISDOL) 1.25 MG (50000 UNIT) CAPS capsule TAKE 1 CAPSULE BY MOUTH ONCE EVERY 7 DAYS. Patient taking differently: Take 50,000 Units by mouth every 7 (seven) days. Takes on Sunday 06/11/22  Yes Mechele Claude, MD    Physical Exam: Vitals:   02/25/23 1215 02/25/23 1300 02/25/23 1500 02/25/23 1557  BP: 136/74 135/78 (!) 145/78 (!) 145/78  Pulse: 86  80 80  Resp: 18 15  15   Temp:   97.7 F (36.5 C) 97.7 F (36.5 C)  TempSrc:   Oral Oral  SpO2: 96%  99%   Weight:    75.8 kg  Height:    5\' 9"  (1.753 m)  Gen: Elderly male in no distress laying relatively flat in stretcher  Pulm: Clear, nonlabored on room air  CV: Irreg irreg, rate in 70's, I don't hear much, maybe 1/6 systolic sounding murmur, edema is 1+ symmetrically in legs.  GI: Soft, NT, ND, +BS  Neuro: Alert and oriented. No new focal deficits. Ext: Warm, no deformities. Skin: No acute rashes, lesions or ulcers on visualized skin   Data Reviewed: K 2.9 SCr 1.33, BUN 45 BNP 382 (down from 551 on 10/14) Hgb 11.8g/dl, macrocytic, platelets 147k, WBC 5k UA negative CXR: Negative ECG: Stable Afib with LBBB  Assessment and Plan: Weakness, failure to thrive: Suspect poor oral intake generally and additional insult with slight overdiuresis/AKI with hypokalemia. He has a declining trajectory, though seems to have been doing ok at home with his son.  - Correct hypokalemia and AKI - PT/OT evaluations - RD consult - Consider appetite stimulant  Hypokalemia:  - Supplemented in ED, will empirically give Mg as well, will recheck with Mg in AM  AKI on stage IIIb CKD:  - Rehydrate enterally and monitor in AM.  - Avoid nephrotoxins.   Acute on chronic HFpEF: His acute decompensation has been improved.  - Hold diuresis this evening. D/w cardiology, Dr. Jenene Slicker who will see tomorrow.  - Check weight (overall negative trajectory noted previous to  this admission) - Monitor I/O - Regular diet  Aortic regurgitation: This was said to be severe in Jan 2024. ?if worsening leading to more frequent CHF exacerbations and worsening exertional limitations.  - Recheck echo  Persistent AFib: No evidence of uncontrolled rate contributing to symptoms.  - No rate control agent on MAR. He has taken metoprolol in the past, though this may have induced bradycardia.  - Not on anticoagulation due to hx ICH - Telemetry monitoring.   Osteoarthritis:  - Pt has been taking nabumetone twice daily. Will need to hold this NSAID with CHF and AKI.  - Continue tylenol, schedule it - Continue tramadol prn  Macrocytic anemia: Last B12, folate wnl, mild anemia.   History of ICH: Avoid anticoagulation  DNR: POA, confirmed  with pt/son at admission.     Advance Care Planning: DNR  Consults: Cardiology per EDP  Family Communication: Son at bedside  Severity of Illness: The appropriate patient status for this patient is INPATIENT. Inpatient status is judged to be reasonable and necessary in order to provide the required intensity of service to ensure the patient's safety. The patient's presenting symptoms, physical exam findings, and initial radiographic and laboratory data in the context of their chronic comorbidities is felt to place them at high risk for further clinical deterioration. Furthermore, it is not anticipated that the patient will be medically stable for discharge from the hospital within 2 midnights of admission.   * I certify that at the point of admission it is my clinical judgment that the patient will require inpatient hospital care spanning beyond 2 midnights from the point of admission due to high intensity of service, high risk for further deterioration and high frequency of surveillance required.*  Author: Tyrone Nine, MD 02/25/2023 5:02 PM  For on call review [email or web address].

## 2023-02-26 ENCOUNTER — Other Ambulatory Visit (HOSPITAL_COMMUNITY): Payer: Medicare Other

## 2023-02-26 DIAGNOSIS — I4819 Other persistent atrial fibrillation: Secondary | ICD-10-CM

## 2023-02-26 DIAGNOSIS — R627 Adult failure to thrive: Principal | ICD-10-CM

## 2023-02-26 DIAGNOSIS — I351 Nonrheumatic aortic (valve) insufficiency: Secondary | ICD-10-CM | POA: Diagnosis not present

## 2023-02-26 DIAGNOSIS — I77819 Aortic ectasia, unspecified site: Secondary | ICD-10-CM

## 2023-02-26 DIAGNOSIS — I5032 Chronic diastolic (congestive) heart failure: Secondary | ICD-10-CM | POA: Diagnosis not present

## 2023-02-26 DIAGNOSIS — Z5309 Procedure and treatment not carried out because of other contraindication: Secondary | ICD-10-CM

## 2023-02-26 LAB — BASIC METABOLIC PANEL
Anion gap: 8 (ref 5–15)
Anion gap: 10 (ref 5–15)
BUN: 38 mg/dL — ABNORMAL HIGH (ref 8–23)
BUN: 36 mg/dL — ABNORMAL HIGH (ref 8–23)
CO2: 29 mmol/L (ref 22–32)
CO2: 29 mmol/L (ref 22–32)
Calcium: 9.3 mg/dL (ref 8.9–10.3)
Calcium: 9.2 mg/dL (ref 8.9–10.3)
Chloride: 98 mmol/L (ref 98–111)
Chloride: 96 mmol/L — ABNORMAL LOW (ref 98–111)
Creatinine, Ser: 1.24 mg/dL (ref 0.61–1.24)
Creatinine, Ser: 1.33 mg/dL — ABNORMAL HIGH (ref 0.61–1.24)
GFR, Estimated: 54 mL/min — ABNORMAL LOW (ref >60)
GFR, Estimated: 50 mL/min — ABNORMAL LOW (ref >60)
Glucose, Bld: 84 mg/dL (ref 70–99)
Glucose, Bld: 91 mg/dL (ref 70–99)
Potassium: 2.8 mmol/L — ABNORMAL LOW (ref 3.5–5.1)
Potassium: 3.2 mmol/L — ABNORMAL LOW (ref 3.5–5.1)
Sodium: 135 mmol/L (ref 135–145)
Sodium: 135 mmol/L (ref 135–145)

## 2023-02-26 LAB — T4, FREE: Free T4: 1.32 ng/dL — ABNORMAL HIGH (ref 0.61–1.12)

## 2023-02-26 LAB — MAGNESIUM: Magnesium: 2.2 mg/dL (ref 1.7–2.4)

## 2023-02-26 LAB — FOLATE: Folate: 15.7 ng/mL (ref >5.9)

## 2023-02-26 LAB — VITAMIN B12: Vitamin B-12: 404 pg/mL (ref 180–914)

## 2023-02-26 LAB — TSH: TSH: 1.923 u[IU]/mL (ref 0.350–4.500)

## 2023-02-26 MED ORDER — MEGESTROL ACETATE 40 MG PO TABS
40.0000 mg | ORAL_TABLET | Freq: Every day | ORAL | Status: DC
  Filled 2023-02-26 (×3): qty 1

## 2023-02-26 MED ORDER — TORSEMIDE 20 MG PO TABS: 20.0000 mg | ORAL_TABLET | Freq: Two times a day (BID) | ORAL | Status: DC

## 2023-02-26 MED ORDER — POTASSIUM CHLORIDE CRYS ER 20 MEQ PO TBCR
40.0000 meq | EXTENDED_RELEASE_TABLET | ORAL | Status: AC
Start: 2023-02-26 — End: 2023-02-26
  Administered 2023-02-26 (×2): 40 meq via ORAL
  Filled 2023-02-26 (×2): qty 2

## 2023-02-26 MED ORDER — MEGESTROL ACETATE 40 MG PO TABS: 40.0000 mg | ORAL_TABLET | Freq: Every day | ORAL | 1 refills | Status: DC

## 2023-02-26 NOTE — Care Management Obs Status (Signed)
MEDICARE OBSERVATION STATUS NOTIFICATION   Patient Details  Name: Brett Blankenship MRN: [id number] Date of Birth: 05/03/1926   Medicare Observation Status Notification Given:  Yes    Villa Herb, LCSWA 02/26/2023, 3:23 PM

## 2023-02-26 NOTE — Progress Notes (Signed)
PROGRESS NOTE  Brett Blankenship ZOX:[id number] DOB: 06/01/1926 DOA: 02/25/2023 PCP: Mechele Claude, MD  Brief History:  89 y.o. male with a history of SDH subsequently taken off apixaban May 2024, ascending aorta dilatation (49 mm), former smoker, , cervical spine (C7) fracture, HTN, HLD, AFib, chronic HFpEF, severe AIknown cystic lesion in the pancreas (measuring up to 21 mm)  who presented to the ED this morning due to weakness, fatigue, poor oral intake worsening over the 2 weeks.  Patient was recently admitted to the hospital from 01/27/2023 to 02/03/2023 for Acute HFpEF.  He was discharged home with torsemide 40 mg twice daily.  His discharge weight was 177.4 pounds. He followed up with Dr. Jenene Slicker in the office on 02/20/2023.  His weight at that time was 168 pounds.  He was continued on his torsemide 40 mg twice daily.  However after repeat labs were done, his torsemide was increased to 60 mg twice daily on 02/24/2023.  Patient states that his leg edema has been improving even since his discharge from the hospital.  The patient denies any falls but feels like he has been on more off balance.  He denies any fevers, chills, headache, chest pain, shortness breath, nausea, vomiting diarrhea, domino pain, dysuria.  In the ED, the patient was afebrile hemodynamically stable with oxygen saturation 98% room air.  WBC 5.0, hemoglobin 11.8, platelets 147.  Sodium 135, potassium 2.9, bicarbonate 27, serum creatinine 1.33.  Chest x-ray was negative for any acute findings.  The patient was admitted for further evaluation and treatment of his failure to thrive symptoms.   Assessment/Plan: Generalized weakness/failure to thrive -Secondary to poor oral intake and aggressive diuresis -B12 -Folic acid -TSH -PT evaluation -UA negative for pyuria -Liberalize diet temporarily  Hypokalemia -Magnesium 2.2 -Repleting  CKD stage IIIa -Baseline creatinine 0.9-1.2 -Holding torsemide  temporarily  Chronic HFpEF -05/28/2022 echo EF 60 to 65%, severe AI, mild MR, ascending aortic dilatation 49 mm -Consulting cardiology  Persistent atrial fibrillation -Off apixaban since May 2024 secondary to SDH -Rate controlled  Severe aortic valve regurgitation -Patient has refused TEE in the past -Not a candidate for surgical intervention secondary to advanced age  Osteoarthritis -Holding NSAIDs secondary to AKI and CHF -Continue tramadol as needed -Continue acetaminophen scheduled           Family Communication:  son updated 10/30  Consultants:  cardiology  Code Status:DNR  DVT Prophylaxis:  SCDs   Procedures: As Listed in Progress Note Above  Antibiotics: None      Subjective: Patient denies fevers, chills, headache, chest pain, dyspnea, nausea, vomiting, diarrhea, abdominal pain, dysuria, hematuria, hematochezia, and melena.   Objective: Vitals:   02/25/23 1500 02/25/23 1557 02/25/23 2043 02/26/23 0500  BP: (!) 145/78 (!) 145/78 (!) 144/69 125/71  Pulse: 80 80 89 78  Resp:  15 17 18   Temp: 97.7 F (36.5 C) 97.7 F (36.5 C) 97.9 F (36.6 C)   TempSrc: Oral Oral  Oral  SpO2: 99%  97% 98%  Weight:  75.8 kg  79 kg  Height:  5\' 9"  (1.753 m)      Intake/Output Summary (Last 24 hours) at 02/26/2023 0653 Last data filed at 02/26/2023 0241 Gross per 24 hour  Intake 72.31 ml  Output 500 ml  Net -427.69 ml   Weight change:  Exam:  General:  Pt is alert, follows commands appropriately, not in acute distress HEENT: No icterus, No thrush, No neck mass,  [place]/AT Cardiovascular: RRR, S1/S2, no rubs, no gallops Respiratory: CTA bilaterally, no wheezing, no crackles, no rhonchi Abdomen: Soft/+BS, non tender, non distended, no guarding Extremities: 1 + LE edema, No lymphangitis, No petechiae, No rashes, no synovitis   Data Reviewed: I have personally reviewed following labs and imaging studies Basic Metabolic Panel: Recent Labs  Lab  02/25/23 1052 02/26/23 0511  NA 135 135  K 2.9* 2.8*  CL 95* 98  CO2 27 29  GLUCOSE 93 84  BUN 45* 38*  CREATININE 1.33* 1.24  CALCIUM 9.6 9.3  MG  --  2.2   Liver Function Tests: No results for input(s): "AST", "ALT", "ALKPHOS", "BILITOT", "PROT", "ALBUMIN" in the last 168 hours. No results for input(s): "LIPASE", "AMYLASE" in the last 168 hours. No results for input(s): "AMMONIA" in the last 168 hours. Coagulation Profile: No results for input(s): "INR", "PROTIME" in the last 168 hours. CBC: Recent Labs  Lab 02/25/23 1052  WBC 5.0  HGB 11.8*  HCT 35.3*  MCV 107.0*  PLT 147*   Cardiac Enzymes: No results for input(s): "CKTOTAL", "CKMB", "CKMBINDEX", "TROPONINI" in the last 168 hours. BNP: Invalid input(s): "POCBNP" CBG: No results for input(s): "GLUCAP" in the last 168 hours. HbA1C: No results for input(s): "HGBA1C" in the last 72 hours. Urine analysis:    Component Value Date/Time   COLORURINE YELLOW 02/25/2023 1318   APPEARANCEUR CLEAR 02/25/2023 1318   LABSPEC 1.014 02/25/2023 1318   PHURINE 6.0 02/25/2023 1318   GLUCOSEU NEGATIVE 02/25/2023 1318   HGBUR NEGATIVE 02/25/2023 1318   BILIRUBINUR NEGATIVE 02/25/2023 1318   KETONESUR NEGATIVE 02/25/2023 1318   PROTEINUR NEGATIVE 02/25/2023 1318   NITRITE NEGATIVE 02/25/2023 1318   LEUKOCYTESUR NEGATIVE 02/25/2023 1318   Sepsis Labs: @LABRCNTIP (procalcitonin:4,lacticidven:4) )No results found for this or any previous visit (from the past 240 hour(s)).   Scheduled Meds:  acetaminophen  1,000 mg Oral TID   sodium chloride flush  3 mL Intravenous Q12H   Continuous Infusions:  Procedures/Studies: DG Chest Port 1 View  Result Date: 02/25/2023 CLINICAL DATA:  Weakness. EXAM: PORTABLE CHEST 1 VIEW COMPARISON:  X-ray 01/28/2023. FINDINGS: Calcified aorta. Normal cardiopericardial silhouette. No pneumothorax, effusion or edema. No consolidation. Chronic lung changes identified. Overlapping cardiac leads.  Osteopenia. IMPRESSION: Chronic lung changes.  No acute cardiopulmonary disease. Electronically Signed   By: Karen Kays M.D.   On: 02/25/2023 13:27   DG Chest Port 1 View  Result Date: 01/28/2023 CLINICAL DATA:  Acute heart failure. EXAM: PORTABLE CHEST 1 VIEW COMPARISON:  January 27, 2023. FINDINGS: Stable cardiomediastinal silhouette. Increased bibasilar opacities are noted concerning for worsening atelectasis or infiltrates. Bony thorax is unremarkable. IMPRESSION: Increased bibasilar opacities are noted concerning for worsening atelectasis or infiltrates. Electronically Signed   By: Lupita Raider M.D.   On: 01/28/2023 11:00   DG Chest 2 View  Result Date: 01/27/2023 CLINICAL DATA:  Provided history: Shortness of breath.  Shingles. EXAM: CHEST - 2 VIEW COMPARISON:  Prior chest radiographs 12/08/2022 and earlier. Chest CT 09/24/2022. FINDINGS: Cardiomegaly. A known aneurysm of the ascending thoracic aorta was better appreciated on the prior chest CT of 09/24/2022. Aortic atherosclerosis. Mild ill-defined and bandlike opacities at the lung bases. Trace bilateral pleural effusions. No evidence of pneumothorax. No acute osseous abnormality identified. Spondylosis and dextrocurvature of the thoracic spine. IMPRESSION: 1. Mild ill-defined and bandlike opacities at the lung bases. While these findings may reflect atelectasis, pneumonia cannot be excluded. Radiographic follow-up to resolution recommended. 2. Trace bilateral pleural effusions. 3. Cardiomegaly.  4. Known ascending thoracic aortic aneurysm better appreciated on the prior chest CT of 09/24/2022. 5. Aortic Atherosclerosis (ICD10-I70.0). Electronically Signed   By: Jackey Loge D.O.   On: 01/27/2023 12:04    Catarina Hartshorn, DO  [facility]  If 7PM-7AM, please contact night-coverage [email or web address] Password [email or web address] 02/26/2023, 6:53 AM   LOS: 1 day

## 2023-02-26 NOTE — Care Management CC44 (Signed)
Condition Code 44 Documentation Completed  Patient Details  Name: NTHONY TRIPLETT MRN: [id number] Date of Birth: 08/19/1926   Condition Code 44 given:  Yes Patient signature on Condition Code 44 notice:  Yes Documentation of 2 MD's agreement:  Yes Code 44 added to claim:  Yes    Villa Herb, LCSWA 02/26/2023, 3:23 PM

## 2023-02-26 NOTE — TOC CM/SW Note (Signed)
Transition of Care [facility]) - Inpatient Brief Assessment   Patient Details  Name: Brett Blankenship MRN: [id number] Date of Birth: 05-25-1926  Transition of Care [facility]) CM/SW Contact:    Villa Herb, LCSWA Phone Number: 02/26/2023, 11:44 AM   Clinical Narrative: Transition of Care Department [facility]) has reviewed patient and no TOC needs have been identified at this time. We will continue to monitor patient advancement through interdisciplinary progression rounds. If new patient transition needs arise, please place a TOC consult.  Transition of Care Asessment: Insurance and Status: Insurance coverage has been reviewed Patient has primary care physician: Yes Home environment has been reviewed: from home Prior level of function:: independent Prior/Current Home Services: No current home services Social Determinants of Health Reivew: SDOH reviewed no interventions necessary Readmission risk has been reviewed: Yes Transition of care needs: no transition of care needs at this time

## 2023-02-26 NOTE — Consult Note (Signed)
Cardiology Consultation   Patient ID: MOXON BEHNEY MRN: [id number]; DOB: 07-29-1926  Admit date: 02/25/2023 Date of Consult: 02/26/2023  PCP:  Mechele Claude, MD   [facility] Providers Cardiologist:  Marjo Bicker, MD        Patient Profile:   Brett Blankenship is a 89 y.o. male with a hx of HTN, HLD, Stage 3 CKD, severe aortic valve regurgitation, ascending aorta dilatation (at 49 mm by echo in 04/2022), persistent atrial fibrillation and chronic HFpEF who is being seen 02/26/2023 for the evaluation of HFpEF at the request of Dr. Arbutus Leas.  History of Present Illness:   [name] was recently admitted from 9/30 - 02/03/2023 for an acute CHF exacerbation. He reported his baseline weight was approximately 180 - 182 lbs and this had increased at home and he had worsening dyspnea and fluid retention prior to admission. BNP was elevated to 1031 and he responded well to IV Lasix during admission. Was switched to Torsemide 40 mg twice daily at discharge and was started on Metolazone 2.5 mg 3x weekly. It was felt that elevated rates were likely contributing to his CHF exacerbation as well and he was started on Toprol-XL 12.5 mg daily but it appears this was stopped prior to discharge. He was again not interested in surgical interventions for his severe aortic valve regurgitation which was certainly reasonable given his advanced age and also not felt to be a candidate.  He did follow-up with Dr. Jenene Slicker on 02/20/2023 and was felt to be compensated at that time and denied any respiratory issues. Weight was at 168 lbs and he was continued on his current diuretic regimen. Follow-up labs did show his proBNP was elevated at 7313 and was recommend to increase Torsemide to 60 mg twice daily.  He presented back to [facility] ED yesterday morning for evaluation of worsening weakness, fatigue and poor oral intake. In talking with the patient today, he reports feeling like he was  dehydrated for the past few days leading up to admission. Says he would wake up in the middle of the night and was very thirsty and would want to consume water at that time which is unusual for him. Says he typically consumes approximately 20 to 30 ounces of fluid during the day in the form of water, 8 to 10 ounces of soda, and Boost. Says his lower extremity edema has significantly improved. No recent dyspnea on exertion, orthopnea or PND. No specific chest pain or palpitations. He is unsure of his current medications as he reports these are managed by his son.  Initial labs showed WBC 5.0, Hgb 11.8, platelets 147, Na+ 135, K+ 2.9 and creatinine 1.33 (previously at 1.02 at the time of hospital discharge earlier this month). BNP 382 (previously 1200 earlier this month). Magnesium 2.2. TSH 1.923.  CXR showed chronic lung changes with no acute cardiopulmonary abnormalities.  EKG showed rate controlled atrial fibrillation, heart rate 76 with LBBB.   Was admitted for weakness and failure to thrive and was recommended to pursue PT/OT consults. Diuretics were held on admission given concerns for overdiuresis.  Follow-up labs today show K+ is still low at 2.8 (supplementation already ordered by the admitting team) but creatinine has improved to 1.24. This morning, he reports already feeling much better and is anxious to go home.   Past Medical History:  Diagnosis Date   Aortic regurgitation    Atrial fibrillation (HCC)    C7 cervical fracture (HCC)    CHF (  congestive heart failure) (HCC)    HYPERTENSION 07/12/2009    Past Surgical History:  Procedure Laterality Date   APPENDECTOMY  1960   NECK SURGERY       Home Medications:  Prior to Admission medications   Medication Sig Start Date End Date Taking? Authorizing Provider  acetaminophen (TYLENOL) 500 MG tablet Take 2 tablets (1,000 mg total) by mouth 3 (three) times daily. For pain 12/25/22  Yes Stacks, Broadus [name], MD  fish oil-omega-3 fatty acids 1000  MG capsule Take 2 g by mouth daily.   Yes [provider]  metolazone (ZAROXOLYN) 2.5 MG tablet Take 1 tablet (2.5 mg total) by mouth 3 (three) times a week. Take every Monday, Wednesday, and Friday 02/21/23  Yes Mallipeddi, Vishnu P, MD  Multiple Vitamins-Minerals (EYE VITAMINS & MINERALS PO) Take by mouth. [facility]   Yes [provider]  nabumetone (RELAFEN) 500 MG tablet TAKE 2 TABLETS BY MOUTH TWICE DAILY AS NEEDED FOR PAIN. Patient taking differently: 1,000 mg 2 (two) times daily. 02/24/23  Yes Stacks, Broadus [name], MD  nitroGLYCERIN (NITROSTAT) 0.4 MG SL tablet Place 1 tablet (0.4 mg total) under the tongue every 5 (five) minutes x 3 doses as needed for chest pain (if no relief after 3rd dose, proceed to ED or call 911). 09/30/22  Yes Mallipeddi, Vishnu P, MD  OVER THE COUNTER MEDICATION Take 1 tablet by mouth daily. Vitamin B12   Yes [provider]  potassium chloride SA (KLOR-CON M) 20 MEQ tablet Take 2 tablets (40 mEq total) by mouth every morning AND 1 tablet (20 mEq total) every evening. 10/16/22  Yes Mallipeddi, Vishnu P, MD  Torsemide 60 MG TABS Take 60 mg by mouth 2 (two) times daily. 02/24/23  Yes Mallipeddi, Vishnu P, MD  traMADol (ULTRAM) 50 MG tablet Take 1 tablet (50 mg total) by mouth 4 (four) times daily as needed for moderate pain. 12/25/22  Yes Mechele Claude, MD  Vitamin D, Ergocalciferol, (DRISDOL) 1.25 MG (50000 UNIT) CAPS capsule TAKE 1 CAPSULE BY MOUTH ONCE EVERY 7 DAYS. Patient taking differently: Take 50,000 Units by mouth every 7 (seven) days. Takes on Sunday 06/11/22  Yes Mechele Claude, MD    Inpatient Medications: Scheduled Meds:  acetaminophen  1,000 mg Oral TID   potassium chloride  40 mEq Oral Q4H   sodium chloride flush  3 mL Intravenous Q12H   Continuous Infusions:  PRN Meds: nitroGLYCERIN, traMADol  Allergies:   No Known Allergies  Social History:   Social History   Socioeconomic History   Marital status: Widowed    Spouse  name: Not on file   Number of children: 2   Years of education: 7   Highest education level: 7th grade  Occupational History   Occupation: retired  Tobacco Use   Smoking status: Former    Current packs/day: 0.00    Average packs/day: 0.3 packs/day for 5.0 years (1.5 ttl pk-yrs)    Types: Cigarettes    Start date: 09/12/1958    Quit date: 09/12/1963    Years since quitting: 59.4   Smokeless tobacco: Never  Vaping Use   Vaping status: Never Used  Substance and Sexual Activity   Alcohol use: Not Currently   Drug use: Not Currently   Sexual activity: Not Currently  Other Topics Concern   Not on file  Social History Narrative   Lives alone - daughter in law takes him shopping once a week and out to eat   Grandchildren and son stop by often  Social Determinants of Health   Financial Resource Strain: Low Risk  (03/01/2022)   Overall Financial Resource Strain (CARDIA)    Difficulty of Paying Living Expenses: Not hard at all  Food Insecurity: No Food Insecurity (02/25/2023)   Hunger Vital Sign    Worried About Running Out of Food in the Last Year: Never true    Ran Out of Food in the Last Year: Never true  Transportation Needs: No Transportation Needs (02/25/2023)   PRAPARE - Administrator, Civil Service (Medical): No    Lack of Transportation (Non-Medical): No  Physical Activity: Sufficiently Active (03/01/2022)   Exercise Vital Sign    Days of Exercise per Week: 5 days    Minutes of Exercise per Session: 30 min  Stress: No Stress Concern Present (03/01/2022)   [employer] of Occupational Health - Occupational Stress Questionnaire    Feeling of Stress : Not at all  Social Connections: Moderately Isolated (03/01/2022)   Social Connection and Isolation Panel [NHANES]    Frequency of Communication with Friends and Family: More than three times a week    Frequency of Social Gatherings with Friends and Family: More than three times a week    Attends Religious  Services: More than 4 times per year    Active Member of [employer] or Organizations: No    Attends Banker Meetings: Never    Marital Status: Widowed  Intimate Partner Violence: Not At Risk (02/25/2023)   Humiliation, Afraid, Rape, and Kick questionnaire    Fear of Current or Ex-Partner: No    Emotionally Abused: No    Physically Abused: No    Sexually Abused: No    Family History:    Family History  Problem Relation Age of Onset   Hypertension Father    Heart disease Father    Cancer Brother        colon, lung     ROS:  Please see the history of present illness.   All other ROS reviewed and negative.     Physical Exam/Data:   Vitals:   02/25/23 1500 02/25/23 1557 02/25/23 2043 02/26/23 0500  BP: (!) 145/78 (!) 145/78 (!) 144/69 125/71  Pulse: 80 80 89 78  Resp:  15 17 18   Temp: 97.7 F (36.5 C) 97.7 F (36.5 C) 97.9 F (36.6 C)   TempSrc: Oral Oral  Oral  SpO2: 99%  97% 98%  Weight:  75.8 kg  79 kg  Height:  5\' 9"  (1.753 m)      Intake/Output Summary (Last 24 hours) at 02/26/2023 0845 Last data filed at 02/26/2023 0241 Gross per 24 hour  Intake 72.31 ml  Output 500 ml  Net -427.69 ml      02/26/2023    5:00 AM 02/25/2023    3:57 PM 02/25/2023   10:31 AM  Last 3 Weights  Weight (lbs) 174 lb 2.6 oz 167 lb 167 lb  Weight (kg) 79 kg 75.75 kg 75.751 kg     Body mass index is 25.72 kg/m.  General:  Pleasant, elderly male appearing in no acute distress. HEENT: normal Neck: no JVD Vascular: No carotid bruits; Distal pulses 2+ bilaterally Cardiac:  normal S1, S2; Irregularly irregular, 2/6 systolic murmur.  Lungs:  clear to auscultation bilaterally, no wheezing, rhonchi or rales  Abd: soft, nontender, no hepatomegaly  Ext: trace edema bilaterally.  Musculoskeletal:  No deformities, BUE and BLE strength normal and equal Skin: warm and dry  Neuro:  CNs 2-12 intact,  no focal abnormalities noted Psych:  Normal affect   EKG:  The EKG was  personally reviewed and demonstrates: Rate-controlled atrial fibrillation, heart rate 76 with LBBB  Telemetry:  Telemetry was personally reviewed and demonstrates: Atrial fibrillation, heart rate in 70's to 80's.  Relevant CV Studies:  Echocardiogram: 04/2022 IMPRESSIONS     1. The aortic valve was not well visualized. There is an eccentric aortic  valve regurgitation jet impinging along the ventricular surface of  anterior mitral valve leaflet resulting in thickening/restricted mobility  of anterior mitral valve leaflet with  mild mitral valve regurgitation consistent with chronic, probably severe  aortic valve regurgitation. Mild aortic valve stenosis. Consider TEE for  further evaluation of aortic valve.   2. Aortic dilatation noted. There is mild dilatation of the aortic root,  measuring 43 mm. There is moderate dilatation of the ascending aorta,  measuring 49 mm.   3. Left ventricular ejection fraction, by estimation, is 60 to 65%. The  left ventricle has normal function. Left ventricular endocardial border  not optimally defined to evaluate regional wall motion. There is moderate  asymmetric left ventricular  hypertrophy of the septal and basal segments. Left ventricular diastolic  function could not be evaluated.   4. Right ventricular systolic function was not well visualized. The right  ventricular size is normal.   5. Left atrial size was severely dilated.   6. The mitral valve is abnormal. Mild mitral valve regurgitation. No  evidence of mitral stenosis. Moderate mitral annular calcification.    Laboratory Data:  High Sensitivity Troponin:   Recent Labs  Lab 01/27/23 1006 01/27/23 1203  TROPONINIHS 157* 138*     Chemistry Recent Labs  Lab 02/25/23 1052 02/26/23 0511  NA 135 135  K 2.9* 2.8*  CL 95* 98  CO2 27 29  GLUCOSE 93 84  BUN 45* 38*  CREATININE 1.33* 1.24  CALCIUM 9.6 9.3  MG  --  2.2  GFRNONAA 50* 54*  ANIONGAP 13 8    No results for  input(s): "PROT", "ALBUMIN", "AST", "ALT", "ALKPHOS", "BILITOT" in the last 168 hours. Lipids No results for input(s): "CHOL", "TRIG", "HDL", "LABVLDL", "LDLCALC", "CHOLHDL" in the last 168 hours.  Hematology Recent Labs  Lab 02/25/23 1052  WBC 5.0  RBC 3.30*  HGB 11.8*  HCT 35.3*  MCV 107.0*  MCH 35.8*  MCHC 33.4  RDW 14.1  PLT 147*   Thyroid  Recent Labs  Lab 02/26/23 0511  TSH 1.923    BNP Recent Labs  Lab 02/25/23 1052  BNP 382.0*    DDimer No results for input(s): "DDIMER" in the last 168 hours.   Radiology/Studies:  DG Chest Port 1 View  Result Date: 02/25/2023 CLINICAL DATA:  Weakness. EXAM: PORTABLE CHEST 1 VIEW COMPARISON:  X-ray 01/28/2023. FINDINGS: Calcified aorta. Normal cardiopericardial silhouette. No pneumothorax, effusion or edema. No consolidation. Chronic lung changes identified. Overlapping cardiac leads. Osteopenia. IMPRESSION: Chronic lung changes.  No acute cardiopulmonary disease. Electronically Signed   By: Karen Kays M.D.   On: 02/25/2023 13:27     Assessment and Plan:   1. Weakness/Dehydration - Presented with progressive weakness over the past few days and reports feeling dehydrated as he was waking up overnight to consume water. - While BNP is at 382 on admission, this is improved as compared to earlier in the month as it was at 1200. While he does have some lower extremity edema, this is significantly improved from his prior hospitalization and he denies any recent respiratory  issues with CXR showing no acute cardiopulmonary abnormalities. - He was taking Torsemide 60 mg twice daily along with Metolazone 2.5 mg on M/W/F which has likely led to dehydration given his advanced age and minimal PO intake (averages 20-30 ounces of fluid daily by his description). Would anticipate stopping Metolazone at discharge and only using Torsemide. Will review with MD in regards to using 40 mg twice daily or 60 mg twice daily at discharge.  2. Chronic  HFpEF - Echocardiogram in 04/2022 showed a preserved EF of 60 to 65%. Was recently admitted for an acute CHF exacerbation but suspect dehydration as discussed above given his poor PO intake at home  Would anticipate only starting Torsemide at discharge and stopping Metolazone. He has not been on SGLT2 inhibitor given his advanced age and infection risk.  3. Aortic Regurgitation/Ascending Aorta Aneurysm - Felt to likely have severe aortic valve regurgitation by echocardiogram in 04/2022 and he previously declined TEE and further workup of this. He also has dilatation of the ascending aorta which was at 49 mm by prior echocardiogram in 04/2022 and at 4.5 cm by CTA in 08/2022. He was made DNR last admission. Would continue with conservative management.  4. Persistent Atrial Fibrillation - Required Toprol-XL 12.5 mg daily last admission for rate-control but this was discontinued at the time of discharge. Rates at this time are well-controlled in the 70's to 80's. - He has not been on anticoagulation due to prior ICH in the setting of a fall.  Previously declined Watchman device placement by review of notes as well.  5. Hypokalemia - K+ at 2.8 today and supplementation has been ordered by the admitting team.  6. Stage 3 CKD - Baseline creatinine 1.0 - 1.2. At 1.33 on admission and improving to 1.24 today. Holding diuretics as outlined above.    For questions or updates, please contact [facility] Please consult [email or web address] for contact info under    Signed, Ellsworth Lennox, PA-C  02/26/2023 8:45 AM

## 2023-02-26 NOTE — Hospital Course (Signed)
89 y.o. male with a history of SDH subsequently taken off apixaban May 2024, ascending aorta dilatation (49 mm), former smoker, , cervical spine (C7) fracture, HTN, HLD, AFib, chronic HFpEF, severe AIknown cystic lesion in the pancreas (measuring up to 21 mm)  who presented to the ED this morning due to weakness, fatigue, poor oral intake worsening over the 2 weeks.  Patient was recently admitted to the hospital from 01/27/2023 to 02/03/2023 for Acute HFpEF.  He was discharged home with torsemide 40 mg twice daily.  His discharge weight was 177.4 pounds. He followed up with Dr. Jenene Slicker in the office on 02/20/2023.  His weight at that time was 168 pounds.  He was continued on his torsemide 40 mg twice daily.  However after repeat labs were done, his torsemide was increased to 60 mg twice daily on 02/24/2023.  Patient states that his leg edema has been improving even since his discharge from the hospital.  The patient denies any falls but feels like he has been on more off balance.  He denies any fevers, chills, headache, chest pain, shortness breath, nausea, vomiting diarrhea, domino pain, dysuria.  In the ED, the patient was afebrile hemodynamically stable with oxygen saturation 98% room air.  WBC 5.0, hemoglobin 11.8, platelets 147.  Sodium 135, potassium 2.9, bicarbonate 27, serum creatinine 1.33.  Chest x-ray was negative for any acute findings.  The patient was admitted for further evaluation and treatment of his failure to thrive symptoms.

## 2023-02-26 NOTE — Discharge Summary (Signed)
Physician Discharge Summary   Patient: Brett Blankenship MRN: [id number] DOB: [birth year]  Admit date:     02/25/2023  Discharge date: 02/26/23  Discharge Physician: Onalee Hua Kento Gossman   PCP: Mechele Claude, MD   Recommendations at discharge:   Please follow up with primary care provider within 1-2 weeks  Please repeat BMP and CBC in one week    Hospital Course: 89 y.o. male with a history of SDH subsequently taken off apixaban May 2024, ascending aorta dilatation (49 mm), former smoker, , cervical spine (C7) fracture, HTN, HLD, AFib, chronic HFpEF, severe AIknown cystic lesion in the pancreas (measuring up to 21 mm)  who presented to the ED this morning due to weakness, fatigue, poor oral intake worsening over the 2 weeks.  Patient was recently admitted to the hospital from 01/27/2023 to 02/03/2023 for Acute HFpEF.  He was discharged home with torsemide 40 mg twice daily.  His discharge weight was 177.4 pounds. He followed up with Dr. Jenene Slicker in the office on 02/20/2023.  His weight at that time was 168 pounds.  He was continued on his torsemide 40 mg twice daily.  However after repeat labs were done, his torsemide was increased to 60 mg twice daily on 02/24/2023.  Patient states that his leg edema has been improving even since his discharge from the hospital.  The patient denies any falls but feels like he has been on more off balance.  He denies any fevers, chills, headache, chest pain, shortness breath, nausea, vomiting diarrhea, domino pain, dysuria.  In the ED, the patient was afebrile hemodynamically stable with oxygen saturation 98% room air.  WBC 5.0, hemoglobin 11.8, platelets 147.  Sodium 135, potassium 2.9, bicarbonate 27, serum creatinine 1.33.  Chest x-ray was negative for any acute findings.  The patient was admitted for further evaluation and treatment of his failure to thrive symptoms.  Assessment and Plan: Generalized weakness/failure to thrive  -Secondary to poor oral intake and  aggressive diuresis -B12--404 -Folic acid--15.7 -TSH--1.923 -PT evaluation -UA negative for pyuria -Liberalize diet temporarily -discussed with son risks, benefits, alternatives of appetite stimulant--son wants to start and accepts the risks   Hypokalemia -Magnesium 2.2 -Repleted -repeat BMP one week after d/c   CKD stage IIIa -Baseline creatinine 0.9-1.2 -Holding torsemide temporarily during hospitalization -decrease torsemide to 20 mg po bid after d/c   Chronic HFpEF -05/28/2022 echo EF 60 to 65%, severe AI, mild MR, ascending aortic dilatation 49 mm -Consulting cardiology--appreciated -decrease torsemide to 20 mg po bid after d/c and stop metolazone   Persistent atrial fibrillation -Off apixaban since May 2024 secondary to SDH -Rate controlled   Severe aortic valve regurgitation -Patient has refused TEE in the past -Not a candidate for surgical intervention secondary to advanced age   Osteoarthritis -Holding NSAIDs secondary to AKI and CHF -Continue tramadol as needed -Continue acetaminophen scheduled        Consultants: cardiology Procedures performed: none  Disposition: Home Diet recommendation:  Cardiac diet DISCHARGE MEDICATION: Allergies as of 02/26/2023   No Known Allergies      Medication List     STOP taking these medications    metolazone 2.5 MG tablet Commonly known as: ZAROXOLYN       TAKE these medications    acetaminophen 500 MG tablet Commonly known as: TYLENOL Take 2 tablets (1,000 mg total) by mouth 3 (three) times daily. For pain   EYE VITAMINS & MINERALS PO Take by mouth. [employer]   fish oil-omega-3 fatty acids 1000 MG capsule  Take 2 g by mouth daily.   megestrol 40 MG tablet Commonly known as: MEGACE Take 1 tablet (40 mg total) by mouth daily.   nabumetone 500 MG tablet Commonly known as: RELAFEN TAKE 2 TABLETS BY MOUTH TWICE DAILY AS NEEDED FOR PAIN. What changed: See the new instructions.   nitroGLYCERIN 0.4  MG SL tablet Commonly known as: NITROSTAT Place 1 tablet (0.4 mg total) under the tongue every 5 (five) minutes x 3 doses as needed for chest pain (if no relief after 3rd dose, proceed to ED or call 911).   OVER THE COUNTER MEDICATION Take 1 tablet by mouth daily. Vitamin B12   potassium chloride SA 20 MEQ tablet Commonly known as: KLOR-CON M Take 2 tablets (40 mEq total) by mouth every morning AND 1 tablet (20 mEq total) every evening.   torsemide 20 MG tablet Commonly known as: DEMADEX Take 1 tablet (20 mg total) by mouth 2 (two) times daily. Start taking on: February 27, 2023 What changed:  medication strength how much to take when to take this   traMADol 50 MG tablet Commonly known as: ULTRAM Take 1 tablet (50 mg total) by mouth 4 (four) times daily as needed for moderate pain.   Vitamin D (Ergocalciferol) 1.25 MG (50000 UNIT) Caps capsule Commonly known as: DRISDOL TAKE 1 CAPSULE BY MOUTH ONCE EVERY 7 DAYS. What changed: See the new instructions.        Follow-up Information     Sharlene Dory, NP Follow up on 03/18/2023.   Specialty: Cardiology Why: Cardiology Follow-up on 03/18/2023 at 9:00 AM. Will be at the [facility] office. Contact information: [address] [place] [address] [phone]                Discharge Exam: Ceasar Mons Weights   02/25/23 1031 02/25/23 1557 02/26/23 0500  Weight: 75.8 kg 75.8 kg 79 kg   HEENT:  [place]/AT, No thrush, no icterus CV:  RRR, no rub, no S3, no S4 Lung:  CTA, no wheeze, no rhonchi Abd:  soft/+BS, NT Ext:  trace LE edema, no lymphangitis, no synovitis, no rash   Condition at discharge: stable  The results of significant diagnostics from this hospitalization (including imaging, microbiology, ancillary and laboratory) are listed below for reference.   Imaging Studies: DG Chest Port 1 View  Result Date: 02/25/2023 CLINICAL DATA:  Weakness. EXAM: PORTABLE CHEST 1 VIEW COMPARISON:  X-ray 01/28/2023. FINDINGS:  Calcified aorta. Normal cardiopericardial silhouette. No pneumothorax, effusion or edema. No consolidation. Chronic lung changes identified. Overlapping cardiac leads. Osteopenia. IMPRESSION: Chronic lung changes.  No acute cardiopulmonary disease. Electronically Signed   By: Karen Kays M.D.   On: 02/25/2023 13:27   DG Chest Port 1 View  Result Date: 01/28/2023 CLINICAL DATA:  Acute heart failure. EXAM: PORTABLE CHEST 1 VIEW COMPARISON:  January 27, 2023. FINDINGS: Stable cardiomediastinal silhouette. Increased bibasilar opacities are noted concerning for worsening atelectasis or infiltrates. Bony thorax is unremarkable. IMPRESSION: Increased bibasilar opacities are noted concerning for worsening atelectasis or infiltrates. Electronically Signed   By: Lupita Raider M.D.   On: 01/28/2023 11:00    Microbiology: Results for orders placed or performed during the hospital encounter of 01/27/23  SARS Coronavirus 2 by RT PCR (hospital order, performed in [facility] hospital lab) *cepheid single result test* Anterior Nasal Swab     Status: None   Collection Time: 01/27/23 10:05 AM   Specimen: Anterior Nasal Swab  Result Value Ref Range Status  SARS Coronavirus 2 by RT PCR NEGATIVE NEGATIVE Final    Comment: (NOTE) SARS-CoV-2 target nucleic acids are NOT DETECTED.  The SARS-CoV-2 RNA is generally detectable in upper and lower respiratory specimens during the acute phase of infection. The lowest concentration of SARS-CoV-2 viral copies this assay can detect is 250 copies / mL. A negative result does not preclude SARS-CoV-2 infection and should not be used as the sole basis for treatment or other patient management decisions.  A negative result may occur with improper specimen collection / handling, submission of specimen other than nasopharyngeal swab, presence of viral mutation(s) within the areas targeted by this assay, and inadequate number of viral copies (<250 copies / mL). A negative  result must be combined with clinical observations, patient history, and epidemiological information.  Fact Sheet for Patients:   RoadLapTop.co.za  Fact Sheet for Healthcare Providers: http://kim-miller.com/  This test is not yet approved or  cleared by the [place] FDA and has been authorized for detection and/or diagnosis of SARS-CoV-2 by FDA under an Emergency Use Authorization (EUA).  This EUA will remain in effect (meaning this test can be used) for the duration of the COVID-19 declaration under Section 564(b)(1) of the Act, 21 U.S.C. section 360bbb-3(b)(1), unless the authorization is terminated or revoked sooner.  Performed at [facility], [address], [place], [place] [address]     Labs: CBC: Recent Labs  Lab 02/25/23 1052  WBC 5.0  HGB 11.8*  HCT 35.3*  MCV 107.0*  PLT 147*   Basic Metabolic Panel: Recent Labs  Lab 02/25/23 1052 02/26/23 0511  NA 135 135  K 2.9* 2.8*  CL 95* 98  CO2 27 29  GLUCOSE 93 84  BUN 45* 38*  CREATININE 1.33* 1.24  CALCIUM 9.6 9.3  MG  --  2.2   Liver Function Tests: No results for input(s): "AST", "ALT", "ALKPHOS", "BILITOT", "PROT", "ALBUMIN" in the last 168 hours. CBG: No results for input(s): "GLUCAP" in the last 168 hours.  Discharge time spent: greater than 30 minutes.  Signed: Catarina Hartshorn, MD [facility] 02/26/2023

## 2023-02-26 NOTE — Progress Notes (Signed)
Patient's son was notified of scheduled discharge today.

## 2023-02-26 NOTE — Evaluation (Signed)
Physical Therapy Evaluation Patient Details Name: Brett Blankenship MRN: [id number] DOB: 05-12-1926 Today's Date: 02/26/2023  History of Present Illness  Brett Blankenship is a 88 y.o. male with a history of SDH, cervical spine (C7) fracture, HTN, HLD, AFib, chronic HFpEF, severe AI who presented to the ED this morning due to weakness, fatigue, poor oral intake worsening over the course of weeks, gradually. His taste for food has gone, appetite decreased, but still drinks boost BID. He has been trouble by this previously but last night he felt wiped out worse than ever before and so resolved to come in to the ED today. This was in the setting of increasing his torsemide from 40mg  BID to 60mg  BID, per outpatient MD recommendations due to labs indicating overload. He doesn't really go outside like her used to, per his son, hasn't been walking as much inside as before, had severe leg swelling that has actually improved. He denies new chest pain but does get that from time to time. Doesn't really report falls but feels off balance more.   Clinical Impression  Patient functioning near baseline for functional mobility and gait demonstrating good return for ambulating in room/hallway using RW without loss of balance.  Plan:  Patient discharged from physical therapy to care of nursing for ambulation daily as tolerated for length of stay.          If plan is discharge home, recommend the following: Help with stairs or ramp for entrance   Can travel by private vehicle        Equipment Recommendations None recommended by PT  Recommendations for Other Services       Functional Status Assessment Patient has not had a recent decline in their functional status     Precautions / Restrictions Precautions Precautions: Fall Restrictions Weight Bearing Restrictions: No      Mobility  Bed Mobility Overal bed mobility: Modified Independent             General bed mobility comments: has to use  bed rail with HOB slightly raised    Transfers Overall transfer level: Modified independent Equipment used: Rolling walker (2 wheels)                    Ambulation/Gait Ambulation/Gait assistance: Modified independent (Device/Increase time), Supervision Gait Distance (Feet): 80 Feet Assistive device: Rolling walker (2 wheels) Gait Pattern/deviations: Decreased step length - right, Decreased step length - left, Decreased stride length Gait velocity: decreased     General Gait Details: slightly labored cadence without loss of balance with good return for ambulating in room/hallway without loss of balance  Stairs            Wheelchair Mobility     Tilt Bed    Modified Rankin (Stroke Patients Only)       Balance Overall balance assessment: Needs assistance Sitting-balance support: Feet supported, No upper extremity supported Sitting balance-Leahy Scale: Good Sitting balance - Comments: seated at EOB   Standing balance support: During functional activity, Bilateral upper extremity supported Standing balance-Leahy Scale: Fair Standing balance comment: fair/good using RW                             Pertinent Vitals/Pain Pain Assessment Pain Assessment: No/denies pain    Home Living Family/patient expects to be discharged to:: Private residence Living Arrangements: Children Available Help at Discharge: Family;Available 24 hours/day Type of Home: Mobile home Home Access: Ramped entrance  Home Layout: One level Home Equipment: Cane - quad;Cane - single point;Shower seat;Other (comment);Rolling Walker (2 wheels)      Prior Function Prior Level of Function : Needs assist       Physical Assist : ADLs (physical);Mobility (physical) Mobility (physical): Bed mobility;Transfers;Gait;Stairs   Mobility Comments: household and short distanced community ambulator using RW ADLs Comments: Assisted by family     Extremity/Trunk Assessment    Upper Extremity Assessment Upper Extremity Assessment: Overall WFL for tasks assessed    Lower Extremity Assessment Lower Extremity Assessment: Overall WFL for tasks assessed    Cervical / Trunk Assessment Cervical / Trunk Assessment: Normal  Communication   Communication Communication: No apparent difficulties  Cognition Arousal: Alert Behavior During Therapy: WFL for tasks assessed/performed Overall Cognitive Status: Within Functional Limits for tasks assessed                                          General Comments      Exercises     Assessment/Plan    PT Assessment Patient does not need any further PT services  PT Problem List         PT Treatment Interventions      PT Goals (Current goals can be found in the Care Plan section)  Acute Rehab PT Goals Patient Stated Goal: return home with his son to assist PT Goal Formulation: With patient Time For Goal Achievement: 02/26/23 Potential to Achieve Goals: Good    Frequency       Co-evaluation               AM-PAC PT "6 Clicks" Mobility  Outcome Measure Help needed turning from your back to your side while in a flat bed without using bedrails?: None Help needed moving from lying on your back to sitting on the side of a flat bed without using bedrails?: None Help needed moving to and from a bed to a chair (including a wheelchair)?: None Help needed standing up from a chair using your arms (e.g., wheelchair or bedside chair)?: None Help needed to walk in hospital room?: A Little Help needed climbing 3-5 steps with a railing? : A Little 6 Click Score: 22    End of Session   Activity Tolerance: Patient tolerated treatment well;Patient limited by fatigue Patient left: in chair;with call bell/phone within reach Nurse Communication: Mobility status PT Visit Diagnosis: Unsteadiness on feet (R26.81);Other abnormalities of gait and mobility (R26.89);Muscle weakness (generalized) (M62.81)     Time: 5638-7564 PT Time Calculation (min) (ACUTE ONLY): 25 min   Charges:   PT Evaluation $PT Eval Moderate Complexity: 1 Mod PT Treatments $Therapeutic Activity: 23-37 mins PT General Charges $$ ACUTE PT VISIT: 1 Visit         2:26 PM, 02/26/23 Ocie Bob, MPT Physical Therapist with [facility] [phone] office [phone] mobile phone

## 2023-03-03 ENCOUNTER — Ambulatory Visit (INDEPENDENT_AMBULATORY_CARE_PROVIDER_SITE_OTHER): Payer: Medicare Other

## 2023-03-03 VITALS — Ht 70.0 in | Wt 174.0 lb

## 2023-03-03 DIAGNOSIS — Z0000 Encounter for general adult medical examination without abnormal findings: Secondary | ICD-10-CM

## 2023-03-03 NOTE — Progress Notes (Signed)
Subjective:   Brett Blankenship is a 89 y.o. male who presents for Medicare Annual/Subsequent preventive examination.  Visit Complete: Virtual I connected with  Brett Blankenship on 03/03/23 by a audio enabled telemedicine application and verified that I am speaking with the correct person using two identifiers.  Patient Location: Home  Provider Location: Home Office  I discussed the limitations of evaluation and management by telemedicine. The patient expressed understanding and agreed to proceed.  Vital Signs: Because this visit was a virtual/telehealth visit, some criteria may be missing or patient reported. Any vitals not documented were not able to be obtained and vitals that have been documented are patient reported.  Patient Medicare AWV questionnaire was completed by the patient on 03/03/2023; I have confirmed that all information answered by patient is correct and no changes since this date.  Cardiac Risk Factors include: advanced age (>70men, >53 women);dyslipidemia;hypertension;male gender;sedentary lifestyle     Objective:    Today's Vitals   03/03/23 1535  Weight: 174 lb (78.9 kg)  Height: 5\' 10"  (1.778 m)   Body mass index is 24.97 kg/m.     03/03/2023    3:40 PM 02/25/2023    3:00 PM 02/25/2023   10:31 AM 01/27/2023    2:30 PM 01/27/2023    9:46 AM 12/08/2022    9:07 AM 09/06/2022   12:55 AM  Advanced Directives  Does Patient Have a Medical Advance Directive? Yes No No Yes Yes No No  Type of Estate agent of [place];Living will   Healthcare Power of [employer] Power of Attorney    Does patient want to make changes to medical advance directive? No - Patient declined        Copy of Healthcare Power of Attorney in Chart? Yes - validated most recent copy scanned in chart (See row information)        Would patient like information on creating a medical advance directive? No - Patient declined No - Patient declined No - Patient declined    No - Patient declined No - Patient declined    Current Medications (verified) Outpatient Encounter Medications as of 03/03/2023  Medication Sig   acetaminophen (TYLENOL) 500 MG tablet Take 2 tablets (1,000 mg total) by mouth 3 (three) times daily. For pain   fish oil-omega-3 fatty acids 1000 MG capsule Take 2 g by mouth daily.   megestrol (MEGACE) 40 MG tablet Take 1 tablet (40 mg total) by mouth daily.   Multiple Vitamins-Minerals (EYE VITAMINS & MINERALS PO) Take by mouth. [facility]   nabumetone (RELAFEN) 500 MG tablet TAKE 2 TABLETS BY MOUTH TWICE DAILY AS NEEDED FOR PAIN. (Patient taking differently: 1,000 mg 2 (two) times daily.)   nitroGLYCERIN (NITROSTAT) 0.4 MG SL tablet Place 1 tablet (0.4 mg total) under the tongue every 5 (five) minutes x 3 doses as needed for chest pain (if no relief after 3rd dose, proceed to ED or call 911).   OVER THE COUNTER MEDICATION Take 1 tablet by mouth daily. Vitamin B12   potassium chloride SA (KLOR-CON M) 20 MEQ tablet Take 2 tablets (40 mEq total) by mouth every morning AND 1 tablet (20 mEq total) every evening.   torsemide (DEMADEX) 20 MG tablet Take 1 tablet (20 mg total) by mouth 2 (two) times daily.   traMADol (ULTRAM) 50 MG tablet Take 1 tablet (50 mg total) by mouth 4 (four) times daily as needed for moderate pain.   Vitamin D, Ergocalciferol, (DRISDOL) 1.25 MG (50000 UNIT)  CAPS capsule TAKE 1 CAPSULE BY MOUTH ONCE EVERY 7 DAYS. (Patient taking differently: Take 50,000 Units by mouth every 7 (seven) days. Takes on Sunday)   [DISCONTINUED] metolazone (ZAROXOLYN) 2.5 MG tablet Take 1 tablet (2.5 mg total) by mouth 3 (three) times a week. Take every Monday, Wednesday, and Friday   [DISCONTINUED] nabumetone (RELAFEN) 500 MG tablet Take 1 tablet (500 mg total) by mouth 2 (two) times daily. TAKE 2 TABLETS BY MOUTH TWICE DAILY AS NEEDED FOR PAIN.   [DISCONTINUED] OVER THE COUNTER MEDICATION Take 1 tablet by mouth daily. Eye vitamin (Patient not taking:  Reported on 02/25/2023)   [DISCONTINUED] torsemide (DEMADEX) 20 MG tablet Take 2 tablets (40 mg total) by mouth 2 (two) times daily with breakfast and lunch.   No facility-administered encounter medications on file as of 03/03/2023.    Allergies (verified) Patient has no known allergies.   History: Past Medical History:  Diagnosis Date   Aortic regurgitation    Atrial fibrillation (HCC)    C7 cervical fracture (HCC)    CHF (congestive heart failure) (HCC)    HYPERTENSION 07/12/2009   Past Surgical History:  Procedure Laterality Date   APPENDECTOMY  1960   NECK SURGERY     Family History  Problem Relation Age of Onset   Hypertension Father    Heart disease Father    Cancer Brother        colon, lung   Social History   Socioeconomic History   Marital status: Widowed    Spouse name: Not on file   Number of children: 2   Years of education: 7   Highest education level: 7th grade  Occupational History   Occupation: retired  Tobacco Use   Smoking status: Former    Current packs/day: 0.00    Average packs/day: 0.3 packs/day for 5.0 years (1.5 ttl pk-yrs)    Types: Cigarettes    Start date: 09/12/1958    Quit date: 09/12/1963    Years since quitting: 59.5   Smokeless tobacco: Never  Vaping Use   Vaping status: Never Used  Substance and Sexual Activity   Alcohol use: Not Currently   Drug use: Not Currently   Sexual activity: Not Currently  Other Topics Concern   Not on file  Social History Narrative   Lives alone - daughter in law takes him shopping once a week and out to eat   Grandchildren and son stop by often   Social Determinants of Health   Financial Resource Strain: Low Risk  (03/03/2023)   Overall Financial Resource Strain (CARDIA)    Difficulty of Paying Living Expenses: Not hard at all  Food Insecurity: No Food Insecurity (03/03/2023)   Hunger Vital Sign    Worried About Running Out of Food in the Last Year: Never true    Ran Out of Food in the Last  Year: Never true  Transportation Needs: No Transportation Needs (03/03/2023)   PRAPARE - Administrator, Civil Service (Medical): No    Lack of Transportation (Non-Medical): No  Physical Activity: Inactive (03/03/2023)   Exercise Vital Sign    Days of Exercise per Week: 0 days    Minutes of Exercise per Session: 0 min  Stress: No Stress Concern Present (03/03/2023)   [employer] of Occupational Health - Occupational Stress Questionnaire    Feeling of Stress : Not at all  Social Connections: Socially Isolated (03/03/2023)   Social Connection and Isolation Panel [NHANES]    Frequency of Communication with  Friends and Family: More than three times a week    Frequency of Social Gatherings with Friends and Family: More than three times a week    Attends Religious Services: Never    Database administrator or Organizations: No    Attends Banker Meetings: Never    Marital Status: Widowed    Tobacco Counseling Counseling given: Not Answered   Clinical Intake:  Pre-visit preparation completed: Yes  Pain : No/denies pain     Nutritional Risks: None Diabetes: No  How often do you need to have someone help you when you read instructions, pamphlets, or other written materials from your doctor or pharmacy?: 1 - Never  Interpreter Needed?: No  Information entered by :: Renie Ora, LPN   Activities of Daily Living    03/03/2023    3:40 PM 02/25/2023    3:00 PM  In your present state of health, do you have any difficulty performing the following activities:  Hearing? 0 1  Vision? 0 0  Difficulty concentrating or making decisions? 0 0  Walking or climbing stairs? 0   Dressing or bathing? 0   Doing errands, shopping? 0   Preparing Food and eating ? N   Using the Toilet? N   In the past six months, have you accidently leaked urine? N   Do you have problems with loss of bowel control? N   Managing your Medications? N   Managing your Finances? N    Housekeeping or managing your Housekeeping? N     Patient Care Team: Mechele Claude, MD as PCP - General (Family Medicine) Mallipeddi, Orion Modest, MD as PCP - Cardiology (Cardiology) Sherrie George, MD as Consulting Physician (Ophthalmology) Gust Rung, Wynona Canes, RN as Case Manager  Indicate any recent Medical Services you may have received from other than [facility] providers in the past year (date may be approximate).     Assessment:   This is a routine wellness examination for [place].  Hearing/Vision screen Vision Screening - Comments:: Wears rx glasses - up to date with routine eye exams with  Dr.Johnson    Goals Addressed             This Visit's Progress    DIET - INCREASE WATER INTAKE   On track    Increase water intake to 48 ounces per day.        Depression Screen    03/03/2023    3:39 PM 02/10/2023    4:03 PM 12/06/2022    2:08 PM 10/02/2022   10:01 AM 10/02/2022    9:56 AM 05/23/2022    2:32 PM 04/18/2022    9:41 AM  PHQ 2/9 Scores  PHQ - 2 Score 0 0 0 0 0 5 0  PHQ- 9 Score   0   12     Fall Risk    03/03/2023    3:37 PM 02/10/2023    4:03 PM 12/06/2022    2:08 PM 10/02/2022   10:01 AM 10/02/2022    9:56 AM  Fall Risk   Falls in the past year? 0 0 0 1 0  Number falls in past yr: 0   1   Injury with Fall? 0   1   Risk for fall due to : No Fall Risks   History of fall(s)   Follow up Falls prevention discussed  Falls prevention discussed Falls evaluation completed     MEDICARE RISK AT HOME: Medicare Risk at Home Any stairs in or around  the home?: No If so, are there any without handrails?: No Home free of loose throw rugs in walkways, pet beds, electrical cords, etc?: Yes Adequate lighting in your home to reduce risk of falls?: Yes Life alert?: No Use of a cane, walker or w/c?: Yes Grab bars in the bathroom?: Yes Shower chair or bench in shower?: Yes Elevated toilet seat or a handicapped toilet?: Yes  TIMED UP AND GO:  Was the test performed?  No     Cognitive Function:    11/19/2017    8:23 AM  MMSE - Mini Mental State Exam  Orientation to time 5  Orientation to Place 4  Registration 3  Attention/ Calculation 4  Recall 1  Language- name 2 objects 2  Language- repeat 1  Language- follow 3 step command 3  Language- read & follow direction 1  Write a sentence 1  Copy design 1  Total score 26        03/03/2023    3:41 PM 03/01/2022    9:05 AM 02/28/2021    3:57 PM 02/24/2020    9:01 AM 02/22/2019   11:01 AM  6CIT Screen  What Year? 0 points 0 points 0 points 0 points 0 points  What month? 0 points 0 points 0 points 0 points 0 points  What time? 0 points 0 points 0 points 0 points 0 points  Count back from 20 0 points 0 points 0 points 0 points 0 points  Months in reverse 0 points 0 points 0 points 0 points 0 points  Repeat phrase 0 points 0 points 6 points 10 points 2 points  Total Score 0 points 0 points 6 points 10 points 2 points    Immunizations Immunization History  Administered Date(s) Administered   Fluad Quad(high Dose 65+) 04/03/2020   Influenza,inj,Quad PF,6+ Mos 01/29/2013   Pneumococcal Conjugate-13 03/29/2013   Pneumococcal Polysaccharide-23 01/20/2020   Tdap 11/18/2017, 12/22/2019    TDAP status: Up to date  Flu Vaccine status: Due, Education has been provided regarding the importance of this vaccine. Advised may receive this vaccine at local pharmacy or Health Dept. Aware to provide a copy of the vaccination record if obtained from local pharmacy or Health Dept. Verbalized acceptance and understanding.  Pneumococcal vaccine status: Up to date  Covid-19 vaccine status: Declined, Education has been provided regarding the importance of this vaccine but patient still declined. Advised may receive this vaccine at local pharmacy or Health Dept.or vaccine clinic. Aware to provide a copy of the vaccination record if obtained from local pharmacy or Health Dept. Verbalized acceptance and  understanding.  Qualifies for Shingles Vaccine? Yes   Zostavax completed No   Shingrix Completed?: No.    Education has been provided regarding the importance of this vaccine. Patient has been advised to call insurance company to determine out of pocket expense if they have not yet received this vaccine. Advised may also receive vaccine at local pharmacy or Health Dept. Verbalized acceptance and understanding.  Screening Tests Health Maintenance  Topic Date Due   COVID-19 Vaccine (1) Never done   Zoster Vaccines- Shingrix (1 of 2) Never done   INFLUENZA VACCINE  11/28/2022   Medicare Annual Wellness (AWV)  03/02/2024   DTaP/Tdap/Td (3 - Td or Tdap) 12/21/2029   Pneumonia Vaccine 34+ Years old  Completed   HPV VACCINES  Aged Out    Health Maintenance  Health Maintenance Due  Topic Date Due   COVID-19 Vaccine (1) Never done   Zoster  Vaccines- Shingrix (1 of 2) Never done   INFLUENZA VACCINE  11/28/2022    Colorectal cancer screening: No longer required.   Lung Cancer Screening: (Low Dose CT Chest recommended if Age 18-80 years, 20 pack-year currently smoking OR have quit w/in 15years.) does not qualify.   Lung Cancer Screening Referral: n/a  Additional Screening:  Hepatitis C Screening: does not qualify;  Vision Screening: Recommended annual ophthalmology exams for early detection of glaucoma and other disorders of the eye. Is the patient up to date with their annual eye exam?  Yes  Who is the provider or what is the name of the office in which the patient attends annual eye exams? Dr.Johnson  If pt is not established with a provider, would they like to be referred to a provider to establish care? No .   Dental Screening: Recommended annual dental exams for proper oral hygiene.  Community Resource Referral / Chronic Care Management: CRR required this visit?  No   CCM required this visit?  No     Plan:     I have personally reviewed and noted the following in the  patient's chart:   Medical and social history Use of alcohol, tobacco or illicit drugs  Current medications and supplements including opioid prescriptions. Patient is not currently taking opioid prescriptions. Functional ability and status Nutritional status Physical activity Advanced directives List of other physicians Hospitalizations, surgeries, and ER visits in previous 12 months Vitals Screenings to include cognitive, depression, and falls Referrals and appointments  In addition, I have reviewed and discussed with patient certain preventive protocols, quality metrics, and best practice recommendations. A written personalized care plan for preventive services as well as general preventive health recommendations were provided to patient.     Lorrene Reid, LPN   [id number]   After Visit Summary: (MyChart) Due to this being a telephonic visit, the after visit summary with patients personalized plan was offered to patient via MyChart   Nurse Notes: Son Elizebeth Brooking helped with MWV visit

## 2023-03-03 NOTE — Patient Instructions (Signed)
Brett Blankenship , Thank you for taking time to come for your Medicare Wellness Visit. I appreciate your ongoing commitment to your health goals. Please review the following plan we discussed and let me know if I can assist you in the future.   Referrals/Orders/Follow-Ups/Clinician Recommendations: Aim for 30 minutes of exercise or brisk walking, 6-8 glasses of water, and 5 servings of fruits and vegetables each day.   This is a list of the screening recommended for you and due dates:  Health Maintenance  Topic Date Due   COVID-19 Vaccine (1) Never done   Zoster (Shingles) Vaccine (1 of 2) Never done   Flu Shot  11/28/2022   Medicare Annual Wellness Visit  03/02/2024   DTaP/Tdap/Td vaccine (3 - Td or Tdap) 12/21/2029   Pneumonia Vaccine  Completed   HPV Vaccine  Aged Out    Advanced directives: (In Chart) A copy of your advanced directives are scanned into your chart should your provider ever need it.  Next Medicare Annual Wellness Visit scheduled for next year: Yes  insert Preventive Care attachment Insert FALL PREVENTION attachment if needed

## 2023-03-05 DIAGNOSIS — Z9181 History of falling: Secondary | ICD-10-CM | POA: Diagnosis not present

## 2023-03-05 DIAGNOSIS — Z87440 Personal history of urinary (tract) infections: Secondary | ICD-10-CM | POA: Diagnosis not present

## 2023-03-05 DIAGNOSIS — E782 Mixed hyperlipidemia: Secondary | ICD-10-CM | POA: Diagnosis not present

## 2023-03-05 DIAGNOSIS — N1832 Chronic kidney disease, stage 3b: Secondary | ICD-10-CM | POA: Diagnosis not present

## 2023-03-05 DIAGNOSIS — K862 Cyst of pancreas: Secondary | ICD-10-CM | POA: Diagnosis not present

## 2023-03-05 DIAGNOSIS — I5033 Acute on chronic diastolic (congestive) heart failure: Secondary | ICD-10-CM | POA: Diagnosis not present

## 2023-03-05 DIAGNOSIS — I130 Hypertensive heart and chronic kidney disease with heart failure and stage 1 through stage 4 chronic kidney disease, or unspecified chronic kidney disease: Secondary | ICD-10-CM | POA: Diagnosis not present

## 2023-03-05 DIAGNOSIS — I352 Nonrheumatic aortic (valve) stenosis with insufficiency: Secondary | ICD-10-CM | POA: Diagnosis not present

## 2023-03-05 DIAGNOSIS — I480 Paroxysmal atrial fibrillation: Secondary | ICD-10-CM | POA: Diagnosis not present

## 2023-03-05 DIAGNOSIS — Z79899 Other long term (current) drug therapy: Secondary | ICD-10-CM | POA: Diagnosis not present

## 2023-03-05 DIAGNOSIS — D631 Anemia in chronic kidney disease: Secondary | ICD-10-CM | POA: Diagnosis not present

## 2023-03-05 DIAGNOSIS — D539 Nutritional anemia, unspecified: Secondary | ICD-10-CM | POA: Diagnosis not present

## 2023-03-05 DIAGNOSIS — I4819 Other persistent atrial fibrillation: Secondary | ICD-10-CM | POA: Diagnosis not present

## 2023-03-05 DIAGNOSIS — I509 Heart failure, unspecified: Secondary | ICD-10-CM | POA: Diagnosis not present

## 2023-03-05 DIAGNOSIS — D696 Thrombocytopenia, unspecified: Secondary | ICD-10-CM | POA: Diagnosis not present

## 2023-03-05 DIAGNOSIS — Z87891 Personal history of nicotine dependence: Secondary | ICD-10-CM | POA: Diagnosis not present

## 2023-03-18 ENCOUNTER — Telehealth: Payer: Self-pay | Admitting: Family Medicine

## 2023-03-18 ENCOUNTER — Other Ambulatory Visit: Payer: Self-pay | Admitting: Family Medicine

## 2023-03-18 ENCOUNTER — Ambulatory Visit: Payer: Medicare Other | Attending: Nurse Practitioner | Admitting: Nurse Practitioner

## 2023-03-18 ENCOUNTER — Encounter: Payer: Self-pay | Admitting: Nurse Practitioner

## 2023-03-18 ENCOUNTER — Telehealth: Payer: Self-pay | Admitting: Nurse Practitioner

## 2023-03-18 VITALS — BP 100/50 | HR 89 | Ht 69.5 in | Wt 172.0 lb

## 2023-03-18 DIAGNOSIS — I4821 Permanent atrial fibrillation: Secondary | ICD-10-CM | POA: Diagnosis not present

## 2023-03-18 DIAGNOSIS — I351 Nonrheumatic aortic (valve) insufficiency: Secondary | ICD-10-CM | POA: Diagnosis not present

## 2023-03-18 DIAGNOSIS — I5032 Chronic diastolic (congestive) heart failure: Secondary | ICD-10-CM

## 2023-03-18 DIAGNOSIS — R531 Weakness: Secondary | ICD-10-CM

## 2023-03-18 DIAGNOSIS — G8929 Other chronic pain: Secondary | ICD-10-CM

## 2023-03-18 DIAGNOSIS — I350 Nonrheumatic aortic (valve) stenosis: Secondary | ICD-10-CM

## 2023-03-18 DIAGNOSIS — I509 Heart failure, unspecified: Secondary | ICD-10-CM

## 2023-03-18 DIAGNOSIS — Z79899 Other long term (current) drug therapy: Secondary | ICD-10-CM

## 2023-03-18 DIAGNOSIS — I5033 Acute on chronic diastolic (congestive) heart failure: Secondary | ICD-10-CM

## 2023-03-18 DIAGNOSIS — E876 Hypokalemia: Secondary | ICD-10-CM

## 2023-03-18 DIAGNOSIS — Z7189 Other specified counseling: Secondary | ICD-10-CM

## 2023-03-18 DIAGNOSIS — R627 Adult failure to thrive: Secondary | ICD-10-CM

## 2023-03-18 MED ORDER — TORSEMIDE 20 MG PO TABS: ORAL_TABLET | ORAL | 5 refills | Status: DC

## 2023-03-18 NOTE — Telephone Encounter (Signed)
Copied from CRM [id number]. Topic: Clinical - Medical Advice >> Mar 18, 2023 12:33 PM Danika B wrote: Reason for CRM: Patients son Jorja Loa called and stated that patients Palliative care office suggested he get a wheelchair order. Tim wants to know how to begin the process or if Dr Selina Cooley suggests otherwise. Tim's CB# [phone].

## 2023-03-18 NOTE — Telephone Encounter (Signed)
Order printed

## 2023-03-18 NOTE — Progress Notes (Unsigned)
Cardiology Office Note:  .   Date:  03/18/2023 ID:  Brett Blankenship, DOB Apr 03, 1927, MRN [id number] PCP: Mechele Claude, MD  [facility] Providers Cardiologist:  Marjo Bicker, MD    History of Present Illness: .   Brett Blankenship is a 89 y.o. male with a PMH of permanent A-fib, aortic dilatation, history of ICH, chronic diastolic CHF, mild aortic valve stenosis, who presents today for hospital follow-up.    History of multiple ER and hospital admissions since January 2024 due to falls/ADHF.  Eliquis was discontinued due to a fall resulting in SDH/ICH.  CT angio PE in May 2024 showed moderate bilateral pleural effusions.  Lasix was changed to torsemide, underwent right side thoracentesis to improve his symptoms.  Last seen by Dr. Jenene Slicker on February 20, 2023.  It was noted that he had another recent hospital mission for ADHF.  Patient admitted to mild shortness of breath.  Main complaint at office visit was bilateral hip pain.  Also noted decreased p.o. intake due to poor appetite.  Patient was feeling tired.  Was compensated on exam.  Due to his ascending aorta measuring 49 mm and dilatation with possible severe eccentric aortic valve regurgitation it was felt that he was not a candidate for surgical intervention due to his advanced age, patient had refused TEE in the past.  Shortly after office visit, torsemide was increased to 60 mg twice daily.  Shortly readmitted to the hospital after office visit at the end of October.  He was admitted for further evaluation and treatment of his failure to thrive symptoms.  PT evaluated patient.  Was started on appetite stimulant.  Torsemide was decreased to 20 mg p.o. twice daily after discharge, metolazone was stopped.  Today he presents for hospital follow-up with his son.  Patient states he has not been feeling well for the past 3 days, feeling weak. Poor appetite. Sitting in a wheelchair which is new for him. Denies any chest pain,  shortness of breath, palpitations, syncope, presyncope, dizziness, orthopnea, PND, swelling or significant weight changes, acute bleeding, or claudication.  ROS: Negative. See HPI.   Studies Reviewed: .    Echo 04/2022:  1. The aortic valve was not well visualized. There is an eccentric aortic  valve regurgitation jet impinging along the ventricular surface of  anterior mitral valve leaflet resulting in thickening/restricted mobility  of anterior mitral valve leaflet with mild mitral valve regurgitation consistent with chronic, probably severe aortic valve regurgitation. Mild aortic valve stenosis. Consider TEE for further evaluation of aortic valve.   2. Aortic dilatation noted. There is mild dilatation of the aortic root,  measuring 43 mm. There is moderate dilatation of the ascending aorta,  measuring 49 mm.   3. Left ventricular ejection fraction, by estimation, is 60 to 65%. The  left ventricle has normal function. Left ventricular endocardial border  not optimally defined to evaluate regional wall motion. There is moderate asymmetric left ventricular hypertrophy of the septal and basal segments. Left ventricular diastolic function could not be evaluated.   4. Right ventricular systolic function was not well visualized. The right  ventricular size is normal.   5. Left atrial size was severely dilated.   6. The mitral valve is abnormal. Mild mitral valve regurgitation. No  evidence of mitral stenosis. Moderate mitral annular calcification.   Comparison(s): Prior images unable to be directly viewed, comparison made by report only. Changes from prior study are noted. Normal LVEF and eccentric aortic valve regurgitation likely  severe and chronic in duration (worsened from moderate aortic regurgitation from 2018).  Risk Assessment/Calculations:    CHA2DS2-VASc Score = 4   This indicates a 4.8% annual risk of stroke. The patient's score is based upon: CHF History: 0 HTN History:  1 Diabetes History: 0 Stroke History: 0 Vascular Disease History: 1 Age Score: 2 Gender Score: 0      Physical Exam:   VS:  BP (!) 100/50 (BP Location: Right Arm)   Pulse 89   Ht 5' 9.5" (1.765 m)   Wt 172 lb (78 kg)   SpO2 94%   BMI 25.04 kg/m    Wt Readings from Last 3 Encounters:  03/18/23 172 lb (78 kg)  03/03/23 174 lb (78.9 kg)  02/26/23 174 lb 2.6 oz (79 kg)    GEN: Thin, frail 89 y.o. male in no acute distress, sitting in wheelchair, looks fatigued NECK: No JVD; No carotid bruits CARDIAC: S1/S2, irregular rhythm, Grade 2/3 murmur, no rubs or gallops RESPIRATORY:  Clear and diminished to auscultation without rales, wheezing or rhonchi  EXTREMITIES:  Nonpitting, minimal edema to BLE; No deformity   ASSESSMENT AND PLAN: .    Chronic diastolic CHF Stage C/D. Unable to determine NYHA class d/t patient's limited functional status. EF 60-65% 04/2022. Euvolemic and well compensated on exam. GDMT limited d/t patient's frailty and showing multiple signs and symptoms of failure to thrive, see below. Will reduce Torsemide to 20 mg BID and may take extra 20 mg tablet daily PRN for weight gain, swelling, or shortness of breath to help improve his BP. Educated patient's son and he verbalized understanding. Low sodium diet, fluid restriction <2L, and daily weights encouraged. Educated to contact our office for weight gain of 2 lbs overnight or 5 lbs in one week. Will obtain BMET and Mag.   2. Permanent A-fib Denies any tachycardia or palpitations. HR is well controlled today. Continue current medication regimen.  No longer on Eliquis d/t history of fall and SDH/ICH.   3. Aortic insufficiency, aortic valve stenosis Echo 04/2022 revealed probable severe AR and mild aortic valve stenosis. Pt is asymptomatic and is not surgical candidate d/t his advanced age and his current health decline - see below.   3. Weakness, Failure to thrive in adult, Advanced Care Planning Pt showing recent decline  in his health for the past three days and showing multiple signs and symptoms of Failure to thrive including, inactivity, poor nutrition, decreased appetite, and weakness. Discussed/reviewed goals of care and advanced care planning. Son confirms patient has a DNR on file. Discussed palliative care vs hospice. Patient and son are agreeable to Palliative Care referral - will place referral.   4. Hypokalemia Most recent labs show low potassium level. Will check labs as mentioned above. Continue potassium supplement. Continue to follow with PCP.  Dispo: Follow-up with me/APP in 4 weeks via telephone visit or sooner if anything changes.  Signed, Sharlene Dory, NP

## 2023-03-18 NOTE — Telephone Encounter (Signed)
  Patient Consent for Virtual Visit        Brett Blankenship has provided verbal consent on 03/18/2023 for a virtual visit (video or telephone).   CONSENT FOR VIRTUAL VISIT FOR:  Brett Blankenship  By participating in this virtual visit I agree to the following:  I hereby voluntarily request, consent and authorize [facility] and its employed or contracted physicians, physician assistants, nurse practitioners or other licensed health care professionals (the Practitioner), to provide me with telemedicine health care services (the "Services") as deemed necessary by the treating Practitioner. I acknowledge and consent to receive the Services by the Practitioner via telemedicine. I understand that the telemedicine visit will involve communicating with the Practitioner through live audiovisual communication technology and the disclosure of certain medical information by electronic transmission. I acknowledge that I have been given the opportunity to request an in-person assessment or other available alternative prior to the telemedicine visit and am voluntarily participating in the telemedicine visit.  I understand that I have the right to withhold or withdraw my consent to the use of telemedicine in the course of my care at any time, without affecting my right to future care or treatment, and that the Practitioner or I may terminate the telemedicine visit at any time. I understand that I have the right to inspect all information obtained and/or recorded in the course of the telemedicine visit and may receive copies of available information for a reasonable fee.  I understand that some of the potential risks of receiving the Services via telemedicine include:  Delay or interruption in medical evaluation due to technological equipment failure or disruption; Information transmitted may not be sufficient (e.g. poor resolution of images) to allow for appropriate medical decision making by the  Practitioner; and/or  In rare instances, security protocols could fail, causing a breach of personal health information.  Furthermore, I acknowledge that it is my responsibility to provide information about my medical history, conditions and care that is complete and accurate to the best of my ability. I acknowledge that Practitioner's advice, recommendations, and/or decision may be based on factors not within their control, such as incomplete or inaccurate data provided by me or distortions of diagnostic images or specimens that may result from electronic transmissions. I understand that the practice of medicine is not an exact science and that Practitioner makes no warranties or guarantees regarding treatment outcomes. I acknowledge that a copy of this consent can be made available to me via my patient portal [facility] MyChart), or I can request a printed copy by calling the office of [facility].    I understand that my insurance will be billed for this visit.   I have read or had this consent read to me. I understand the contents of this consent, which adequately explains the benefits and risks of the Services being provided via telemedicine.  I have been provided ample opportunity to ask questions regarding this consent and the Services and have had my questions answered to my satisfaction. I give my informed consent for the services to be provided through the use of telemedicine in my medical care

## 2023-03-18 NOTE — Patient Instructions (Addendum)
Medication Instructions:  Your physician has recommended you make the following change in your medication:  Please reduce torsemide (DEMADEX) to 20 MG tablet twice daily, with 1 extra 20 Mg tablet daily as needed for swelling and weight gain.   Labwork: In 2 week at [facility]   Testing/Procedures: None   Follow-Up: Your physician recommends that you schedule a follow-up appointment in: 4 week telephone   Any Other Special Instructions Will Be Listed Below (If Applicable).  If you need a refill on your cardiac medications before your next appointment, please call your pharmacy.

## 2023-03-18 NOTE — Telephone Encounter (Signed)
SON AWARE AND WILL FAX TO [facility]

## 2023-03-20 DIAGNOSIS — K862 Cyst of pancreas: Secondary | ICD-10-CM | POA: Diagnosis not present

## 2023-03-20 DIAGNOSIS — N1832 Chronic kidney disease, stage 3b: Secondary | ICD-10-CM | POA: Diagnosis not present

## 2023-03-20 DIAGNOSIS — Z79899 Other long term (current) drug therapy: Secondary | ICD-10-CM | POA: Diagnosis not present

## 2023-03-20 DIAGNOSIS — I5033 Acute on chronic diastolic (congestive) heart failure: Secondary | ICD-10-CM | POA: Diagnosis not present

## 2023-03-20 DIAGNOSIS — I4819 Other persistent atrial fibrillation: Secondary | ICD-10-CM | POA: Diagnosis not present

## 2023-03-20 DIAGNOSIS — Z87891 Personal history of nicotine dependence: Secondary | ICD-10-CM | POA: Diagnosis not present

## 2023-03-20 DIAGNOSIS — D696 Thrombocytopenia, unspecified: Secondary | ICD-10-CM | POA: Diagnosis not present

## 2023-03-20 DIAGNOSIS — I130 Hypertensive heart and chronic kidney disease with heart failure and stage 1 through stage 4 chronic kidney disease, or unspecified chronic kidney disease: Secondary | ICD-10-CM | POA: Diagnosis not present

## 2023-03-20 DIAGNOSIS — D539 Nutritional anemia, unspecified: Secondary | ICD-10-CM | POA: Diagnosis not present

## 2023-03-20 DIAGNOSIS — Z87440 Personal history of urinary (tract) infections: Secondary | ICD-10-CM | POA: Diagnosis not present

## 2023-03-20 DIAGNOSIS — I352 Nonrheumatic aortic (valve) stenosis with insufficiency: Secondary | ICD-10-CM | POA: Diagnosis not present

## 2023-03-20 DIAGNOSIS — Z9181 History of falling: Secondary | ICD-10-CM | POA: Diagnosis not present

## 2023-03-20 DIAGNOSIS — E782 Mixed hyperlipidemia: Secondary | ICD-10-CM | POA: Diagnosis not present

## 2023-03-20 DIAGNOSIS — D631 Anemia in chronic kidney disease: Secondary | ICD-10-CM | POA: Diagnosis not present

## 2023-03-21 ENCOUNTER — Other Ambulatory Visit: Payer: Self-pay

## 2023-03-21 ENCOUNTER — Emergency Department (HOSPITAL_COMMUNITY)
Admission: EM | Admit: 2023-03-21 | Discharge: 2023-03-21 | Disposition: A | Discharge status: Home / Self Care | Payer: Medicare Other | Attending: Emergency Medicine | Admitting: Emergency Medicine

## 2023-03-21 ENCOUNTER — Encounter (HOSPITAL_COMMUNITY): Payer: Self-pay

## 2023-03-21 ENCOUNTER — Emergency Department (HOSPITAL_COMMUNITY): Payer: Medicare Other

## 2023-03-21 ENCOUNTER — Telehealth: Payer: Self-pay | Admitting: Family Medicine

## 2023-03-21 DIAGNOSIS — J9811 Atelectasis: Secondary | ICD-10-CM | POA: Diagnosis not present

## 2023-03-21 DIAGNOSIS — R053 Chronic cough: Secondary | ICD-10-CM | POA: Diagnosis not present

## 2023-03-21 DIAGNOSIS — I509 Heart failure, unspecified: Secondary | ICD-10-CM | POA: Insufficient documentation

## 2023-03-21 DIAGNOSIS — I4811 Longstanding persistent atrial fibrillation: Secondary | ICD-10-CM | POA: Insufficient documentation

## 2023-03-21 DIAGNOSIS — I110 Hypertensive heart disease with heart failure: Secondary | ICD-10-CM | POA: Insufficient documentation

## 2023-03-21 DIAGNOSIS — R0602 Shortness of breath: Secondary | ICD-10-CM | POA: Diagnosis not present

## 2023-03-21 DIAGNOSIS — R059 Cough, unspecified: Secondary | ICD-10-CM | POA: Diagnosis not present

## 2023-03-21 DIAGNOSIS — J90 Pleural effusion, not elsewhere classified: Secondary | ICD-10-CM | POA: Diagnosis not present

## 2023-03-21 DIAGNOSIS — I4891 Unspecified atrial fibrillation: Secondary | ICD-10-CM | POA: Diagnosis not present

## 2023-03-21 LAB — CBC WITH DIFFERENTIAL/PLATELET
Abs Immature Granulocytes: 0.01 10*3/uL (ref 0.00–0.07)
Basophils Absolute: 0 10*3/uL (ref 0.0–0.1)
Basophils Relative: 1 %
Eosinophils Absolute: 0.2 10*3/uL (ref 0.0–0.5)
Eosinophils Relative: 3 %
HCT: 34.8 % — ABNORMAL LOW (ref 39.0–52.0)
Hemoglobin: 11.7 g/dL — ABNORMAL LOW (ref 13.0–17.0)
Immature Granulocytes: 0 %
Lymphocytes Relative: 30 %
Lymphs Abs: 1.6 10*3/uL (ref 0.7–4.0)
MCH: 36.9 pg — ABNORMAL HIGH (ref 26.0–34.0)
MCHC: 33.6 g/dL (ref 30.0–36.0)
MCV: 109.8 fL — ABNORMAL HIGH (ref 80.0–100.0)
Monocytes Absolute: 0.4 10*3/uL (ref 0.1–1.0)
Monocytes Relative: 8 %
Neutro Abs: 3 10*3/uL (ref 1.7–7.7)
Neutrophils Relative %: 58 %
Platelets: 143 10*3/uL — ABNORMAL LOW (ref 150–400)
RBC: 3.17 MIL/uL — ABNORMAL LOW (ref 4.22–5.81)
RDW: 14.4 % (ref 11.5–15.5)
WBC: 5.2 10*3/uL (ref 4.0–10.5)
nRBC: 0 % (ref 0.0–0.2)

## 2023-03-21 LAB — COMPREHENSIVE METABOLIC PANEL
ALT: 12 U/L (ref 0–44)
AST: 21 U/L (ref 15–41)
Albumin: 2.6 g/dL — ABNORMAL LOW (ref 3.5–5.0)
Alkaline Phosphatase: 44 U/L (ref 38–126)
Anion gap: 8 (ref 5–15)
BUN: 22 mg/dL (ref 8–23)
CO2: 23 mmol/L (ref 22–32)
Calcium: 9.5 mg/dL (ref 8.9–10.3)
Chloride: 108 mmol/L (ref 98–111)
Creatinine, Ser: 1.27 mg/dL — ABNORMAL HIGH (ref 0.61–1.24)
GFR, Estimated: 52 mL/min — ABNORMAL LOW (ref >60)
Glucose, Bld: 108 mg/dL — ABNORMAL HIGH (ref 70–99)
Potassium: 4.5 mmol/L (ref 3.5–5.1)
Sodium: 139 mmol/L (ref 135–145)
Total Bilirubin: 2 mg/dL — ABNORMAL HIGH (ref <1.2)
Total Protein: 6.5 g/dL (ref 6.5–8.1)

## 2023-03-21 NOTE — ED Notes (Signed)
Pt in bed, family at bedside, pt has no requests at this time.

## 2023-03-21 NOTE — ED Triage Notes (Signed)
Weak Cough SOB For 3 days  Pt lives at home with son  Denies recent falls or injuries

## 2023-03-21 NOTE — Telephone Encounter (Signed)
Order reprinted & placed on PCPs desk

## 2023-03-21 NOTE — ED Notes (Signed)
Pt in bed, pt states that he is ready to go home, pt family at bedside, pt and pt's family verbalized understanding d/c and follow up, pt from department via wc.

## 2023-03-21 NOTE — Discharge Instructions (Signed)
Return for any new or worse shortness of breath.  Today's workup chest x-ray without significant findings and labs without significant abnormalities.  Continue the palliative care for the CHF and the valve problem.

## 2023-03-21 NOTE — Telephone Encounter (Signed)
Copied from CRM [id number]. Topic: Clinical - Prescription Issue >> Mar 21, 2023 10:21 AM Gaetano Hawthorne wrote: Reason for CRM: Patient's son called to inform the office that unforunately [facility] does not accept any type of insurance for the wheelchair order was provided. Son was hoping that we could send another order to [facility] - they do take insurance. They need the order and the insurance information to be able to process the request.  [facility] Fax number is [phone]  Phone number is [phone]

## 2023-03-21 NOTE — ED Provider Notes (Addendum)
[place] EMERGENCY DEPARTMENT AT [facility] Provider Note   CSN: [id number] Arrival date & time: 03/21/23  1451     History  Chief Complaint  Patient presents with   Shortness of Breath    Brett Blankenship is a 89 y.o. male.  Patient brought in by son.  Patient lives with son.  Patient known to have congestive heart failure and significant aortic regurgitation.  Also known to have hypertension and atrial fibrillation.  Patient is a former smoker quit 1965.  Temp here is 98.1 heart rate is 103 respirations 28 blood pressure 148/88 oxygen saturation 99%.  Patient states she is got some swelling to both legs but left greater than right.  Patient's son states that it cardiology due to the significant heart valve problem and the CHF put him in palliative care.  Patient is taking his medicine.  Patient son also states that the cough actually has been going on for a while now a little bit worse with eating.  But is able to eat get the food down and able to get liquids down.  No fevers.  No nausea vomiting or diarrhea.       Home Medications Prior to Admission medications   Medication Sig Start Date End Date Taking? Authorizing Provider  acetaminophen (TYLENOL) 500 MG tablet Take 2 tablets (1,000 mg total) by mouth 3 (three) times daily. For pain 12/25/22   Mechele Claude, MD  fish oil-omega-3 fatty acids 1000 MG capsule Take 2 g by mouth daily.    [provider]  Multiple Vitamins-Minerals (EYE VITAMINS & MINERALS PO) Take by mouth. [facility]    [provider]  nabumetone (RELAFEN) 500 MG tablet TAKE 2 TABLETS BY MOUTH TWICE DAILY AS NEEDED FOR PAIN. Patient taking differently: 1,000 mg 2 (two) times daily. 02/24/23   Mechele Claude, MD  nitroGLYCERIN (NITROSTAT) 0.4 MG SL tablet Place 1 tablet (0.4 mg total) under the tongue every 5 (five) minutes x 3 doses as needed for chest pain (if no relief after 3rd dose, proceed to ED or call 911). 09/30/22    Mallipeddi, Vishnu P, MD  OVER THE COUNTER MEDICATION Take 1 tablet by mouth daily. Vitamin B12    [provider]  potassium chloride SA (KLOR-CON M) 20 MEQ tablet Take 2 tablets (40 mEq total) by mouth every morning AND 1 tablet (20 mEq total) every evening. 10/16/22   Mallipeddi, Vishnu P, MD  torsemide (DEMADEX) 20 MG tablet Take 1 tablet (20 mg total) by mouth 2 (two) times daily. May also take 1 tablet (20 mg total) daily as needed (For weight gain/ swelling). 03/18/23   Sharlene Dory, NP  traMADol (ULTRAM) 50 MG tablet Take 1 tablet (50 mg total) by mouth 4 (four) times daily as needed for moderate pain. 12/25/22   Mechele Claude, MD  Vitamin D, Ergocalciferol, (DRISDOL) 1.25 MG (50000 UNIT) CAPS capsule TAKE 1 CAPSULE BY MOUTH ONCE EVERY 7 DAYS. Patient taking differently: Take 50,000 Units by mouth every 7 (seven) days. Takes on Sunday 06/11/22   Mechele Claude, MD      Allergies    Patient has no known allergies.    Review of Systems   Review of Systems  Constitutional:  Negative for chills and fever.  HENT:  Negative for ear pain and sore throat.   Eyes:  Negative for pain and visual disturbance.  Respiratory:  Positive for cough and shortness of breath.   Cardiovascular:  Positive for leg swelling. Negative for  chest pain and palpitations.  Gastrointestinal:  Negative for abdominal pain and vomiting.  Genitourinary:  Negative for dysuria and hematuria.  Musculoskeletal:  Negative for arthralgias and back pain.  Skin:  Negative for color change and rash.  Neurological:  Negative for seizures and syncope.  All other systems reviewed and are negative.   Physical Exam Updated Vital Signs BP (!) 157/79 (BP Location: Right Arm)   Pulse 88   Temp 97.9 F (36.6 C) (Oral)   Resp 20   Ht 1.753 m (5\' 9" )   Wt 78 kg   SpO2 95%   BMI 25.40 kg/m  Physical Exam Vitals and nursing note reviewed.  Constitutional:      General: He is not in acute distress.    Appearance:  Normal appearance. He is well-developed.  HENT:     Head: Normocephalic and atraumatic.     Mouth/Throat:     Mouth: Mucous membranes are moist.  Eyes:     Conjunctiva/sclera: Conjunctivae normal.  Cardiovascular:     Rate and Rhythm: Normal rate and regular rhythm.     Heart sounds: Murmur heard.  Pulmonary:     Effort: Pulmonary effort is normal. No respiratory distress.     Breath sounds: Normal breath sounds. No wheezing, rhonchi or rales.  Abdominal:     Palpations: Abdomen is soft.     Tenderness: There is no abdominal tenderness.  Musculoskeletal:        General: No swelling.     Cervical back: Normal range of motion and neck supple.     Right lower leg: Edema present.     Left lower leg: Edema present.     Comments: Left lower extremity swelling greater than right.  Skin:    General: Skin is warm and dry.     Capillary Refill: Capillary refill takes less than 2 seconds.  Neurological:     General: No focal deficit present.     Mental Status: He is alert and oriented to person, place, and time.  Psychiatric:        Mood and Affect: Mood normal.     ED Results / Procedures / Treatments   Labs (all labs ordered are listed, but only abnormal results are displayed) Labs Reviewed  CBC WITH DIFFERENTIAL/PLATELET - Abnormal; Notable for the following components:      Result Value   RBC 3.17 (*)    Hemoglobin 11.7 (*)    HCT 34.8 (*)    MCV 109.8 (*)    MCH 36.9 (*)    Platelets 143 (*)    All other components within normal limits  COMPREHENSIVE METABOLIC PANEL - Abnormal; Notable for the following components:   Glucose, Bld 108 (*)    Creatinine, Ser 1.27 (*)    Albumin 2.6 (*)    Total Bilirubin 2.0 (*)    GFR, Estimated 52 (*)    All other components within normal limits    EKG EKG Interpretation Date/Time:  Friday March 21 2023 14:59:47 EST Ventricular Rate:  108 PR Interval:    QRS Duration:  122 QT Interval:  382 QTC Calculation: 511 R  Axis:   -53  Text Interpretation: Atrial fibrillation with rapid ventricular response Left anterior fascicular block Left ventricular hypertrophy with QRS widening ( R in aVL , Cornell product ) Cannot rule out Septal infarct , age undetermined Abnormal ECG When compared with ECG of 25-Feb-2023 11:35, rate is faster Confirmed by Meridee Score [phone]) on 03/21/2023 3:02:46 PM  Radiology  DG Chest 2 View  Result Date: 03/21/2023 CLINICAL DATA:  Shortness of breath, cough. EXAM: CHEST - 2 VIEW COMPARISON:  February 25, 2023. FINDINGS: The heart size and mediastinal contours are within normal limits. Minimal bibasilar subsegmental atelectasis is noted with minimal pleural effusions. The visualized skeletal structures are unremarkable. IMPRESSION: Minimal bibasilar subsegmental atelectasis with minimal pleural effusions. Electronically Signed   By: Lupita Raider M.D.   On: 03/21/2023 15:51    Procedures Procedures    Medications Ordered in ED Medications - No data to display  ED Course/ Medical Decision Making/ A&P                                 Medical Decision Making Amount and/or Complexity of Data Reviewed Labs: ordered. Radiology: ordered.  Patient has been moved by cardiology over to palliative care due to the significant aortic regurgitation and CHF.  Patient's workup here today is reassuring chest x-ray minimal bibasilar segmental atelectasis and minimal pleural effusions.  EKG showed atrial fibrillation.  But for the most part heart rate is in the 80s when I was in there.  Did have some recorded up to 103.  Pleat metabolic panel significant total bili at 2 patient's creatinine 1.274 GFR 52.  Not significantly off from baseline potassium normal sodium normal.  CBC white count 5.2 hemoglobin 11.7 platelets 143.  Overall under the circumstances patient is quite stable.  No acute process ongoing currently.  Patient stable for discharge home.  Final Clinical Impression(s) / ED  Diagnoses Final diagnoses:  Chronic cough  Longstanding persistent atrial fibrillation [facility])    Rx / DC Orders ED Discharge Orders     None         Vanetta Mulders, MD 03/21/23 1843    Vanetta Mulders, MD 03/21/23 1843

## 2023-03-24 ENCOUNTER — Other Ambulatory Visit: Payer: Self-pay | Admitting: Internal Medicine

## 2023-03-25 ENCOUNTER — Other Ambulatory Visit: Payer: Self-pay

## 2023-03-25 ENCOUNTER — Other Ambulatory Visit: Payer: Self-pay | Admitting: Family Medicine

## 2023-03-25 MED ORDER — NABUMETONE 500 MG PO TABS: 500.0000 mg | ORAL_TABLET | Freq: Two times a day (BID) | ORAL | 3 refills | Status: DC | PRN

## 2023-03-25 MED ORDER — POTASSIUM CHLORIDE CRYS ER 20 MEQ PO TBCR: EXTENDED_RELEASE_TABLET | ORAL | 3 refills | Status: DC

## 2023-03-25 MED ORDER — TORSEMIDE 20 MG PO TABS: ORAL_TABLET | ORAL | 11 refills | Status: DC

## 2023-03-25 MED ORDER — NITROGLYCERIN 0.4 MG SL SUBL: 0.4000 mg | SUBLINGUAL_TABLET | SUBLINGUAL | 3 refills | Status: DC | PRN

## 2023-03-25 NOTE — Telephone Encounter (Signed)
Order faxed to [facility] [phone]

## 2023-03-26 ENCOUNTER — Ambulatory Visit: Payer: Medicare Other | Admitting: Internal Medicine

## 2023-03-31 ENCOUNTER — Other Ambulatory Visit: Payer: Self-pay | Admitting: Family Medicine

## 2023-03-31 ENCOUNTER — Telehealth: Payer: Self-pay | Admitting: Family Medicine

## 2023-03-31 NOTE — Telephone Encounter (Signed)
Copied from CRM #[id number]. Topic: Clinical - Medication Refill >> Mar 31, 2023 12:25 PM Thomes Dinning wrote: Most Recent Primary Care Visit:  Provider: Lorrene Reid  Department: [facility]  Visit Type: MEDICARE AWV, SEQUENTIAL  Date: 03/03/2023  Medication: traMADol (ULTRAM) 50 MG tablet  Has the patient contacted their pharmacy? Yes (Agent: If no, request that the patient contact the pharmacy for the refill. If patient does not wish to contact the pharmacy document the reason why and proceed with request.) (Agent: If yes, when and what did the pharmacy advise?)  Is this the correct pharmacy for this prescription? Yes If no, delete pharmacy and type the correct one.  This is the patient's preferred pharmacy:  [facility] [place], [place] - [id number] [place] [address] Phone: [phone] Fax: [phone]  [facility] - [address] [place], [place] - [address] [place] [address] Phone: [phone] Fax: [phone]   Has the prescription been filled recently? No  Is the patient out of the medication? Yes  Has the patient been seen for an appointment in the last year OR does the patient have an upcoming appointment? Yes  Can we respond through MyChart? Yes  Agent: Please be advised that Rx refills may take up to 3 business days. We ask that you follow-up with your pharmacy.

## 2023-03-31 NOTE — Telephone Encounter (Signed)
Appt scheduled for 04/16/2023, added to wait list

## 2023-04-01 ENCOUNTER — Other Ambulatory Visit: Payer: Self-pay | Admitting: Family Medicine

## 2023-04-01 DIAGNOSIS — I509 Heart failure, unspecified: Secondary | ICD-10-CM | POA: Diagnosis not present

## 2023-04-01 DIAGNOSIS — R0600 Dyspnea, unspecified: Secondary | ICD-10-CM | POA: Diagnosis not present

## 2023-04-01 DIAGNOSIS — I4891 Unspecified atrial fibrillation: Secondary | ICD-10-CM | POA: Diagnosis not present

## 2023-04-01 DIAGNOSIS — I4821 Permanent atrial fibrillation: Secondary | ICD-10-CM | POA: Diagnosis not present

## 2023-04-01 DIAGNOSIS — R630 Anorexia: Secondary | ICD-10-CM | POA: Diagnosis not present

## 2023-04-01 DIAGNOSIS — Z79899 Other long term (current) drug therapy: Secondary | ICD-10-CM | POA: Diagnosis not present

## 2023-04-01 DIAGNOSIS — I5032 Chronic diastolic (congestive) heart failure: Secondary | ICD-10-CM | POA: Diagnosis not present

## 2023-04-01 MED ORDER — TRAMADOL HCL 50 MG PO TABS: 50.0000 mg | ORAL_TABLET | Freq: Four times a day (QID) | ORAL | 0 refills | Status: DC | PRN

## 2023-04-02 LAB — BASIC METABOLIC PANEL
BUN/Creatinine Ratio: 16 (ref 10–24)
BUN: 20 mg/dL (ref 10–36)
CO2: 20 mmol/L (ref 20–29)
Calcium: 9.6 mg/dL (ref 8.6–10.2)
Chloride: 105 mmol/L (ref 96–106)
Creatinine, Ser: 1.29 mg/dL — ABNORMAL HIGH (ref 0.76–1.27)
Glucose: 68 mg/dL — ABNORMAL LOW (ref 70–99)
Potassium: 4.4 mmol/L (ref 3.5–5.2)
Sodium: 142 mmol/L (ref 134–144)
eGFR: 51 mL/min/{1.73_m2} — ABNORMAL LOW (ref >59)

## 2023-04-02 LAB — MAGNESIUM: Magnesium: 2.1 mg/dL (ref 1.6–2.3)

## 2023-04-04 ENCOUNTER — Other Ambulatory Visit: Payer: Self-pay

## 2023-04-04 DIAGNOSIS — N1832 Chronic kidney disease, stage 3b: Secondary | ICD-10-CM | POA: Diagnosis not present

## 2023-04-04 DIAGNOSIS — I509 Heart failure, unspecified: Secondary | ICD-10-CM | POA: Diagnosis not present

## 2023-04-04 DIAGNOSIS — I480 Paroxysmal atrial fibrillation: Secondary | ICD-10-CM | POA: Diagnosis not present

## 2023-04-10 ENCOUNTER — Telehealth: Payer: Self-pay | Admitting: Family Medicine

## 2023-04-10 NOTE — Telephone Encounter (Signed)
Copied from CRM [id number]. Topic: General - Other >> Apr 10, 2023 11:54 AM Sasha H wrote: Reason for CRM: Hospice nurse called in to let provider know that pt had a fall last night with no injuries . Irving Burton [phone]

## 2023-04-11 ENCOUNTER — Emergency Department (HOSPITAL_COMMUNITY)
Admission: EM | Admit: 2023-04-11 | Discharge: 2023-04-11 | Disposition: A | Discharge status: Hospice | Payer: Medicare Other | Attending: Emergency Medicine | Admitting: Emergency Medicine

## 2023-04-11 ENCOUNTER — Encounter (HOSPITAL_COMMUNITY): Payer: Self-pay | Admitting: Radiology

## 2023-04-11 ENCOUNTER — Other Ambulatory Visit: Payer: Self-pay

## 2023-04-11 ENCOUNTER — Emergency Department (HOSPITAL_COMMUNITY): Payer: Medicare Other

## 2023-04-11 DIAGNOSIS — I110 Hypertensive heart disease with heart failure: Secondary | ICD-10-CM | POA: Insufficient documentation

## 2023-04-11 DIAGNOSIS — I509 Heart failure, unspecified: Secondary | ICD-10-CM | POA: Insufficient documentation

## 2023-04-11 DIAGNOSIS — R443 Hallucinations, unspecified: Secondary | ICD-10-CM

## 2023-04-11 DIAGNOSIS — I700 Atherosclerosis of aorta: Secondary | ICD-10-CM | POA: Diagnosis not present

## 2023-04-11 DIAGNOSIS — I4819 Other persistent atrial fibrillation: Secondary | ICD-10-CM

## 2023-04-11 DIAGNOSIS — R4182 Altered mental status, unspecified: Secondary | ICD-10-CM | POA: Diagnosis not present

## 2023-04-11 DIAGNOSIS — I5032 Chronic diastolic (congestive) heart failure: Secondary | ICD-10-CM | POA: Diagnosis not present

## 2023-04-11 DIAGNOSIS — R079 Chest pain, unspecified: Secondary | ICD-10-CM | POA: Diagnosis not present

## 2023-04-11 DIAGNOSIS — N1832 Chronic kidney disease, stage 3b: Secondary | ICD-10-CM | POA: Diagnosis not present

## 2023-04-11 DIAGNOSIS — J90 Pleural effusion, not elsewhere classified: Secondary | ICD-10-CM | POA: Diagnosis not present

## 2023-04-11 DIAGNOSIS — I6782 Cerebral ischemia: Secondary | ICD-10-CM | POA: Diagnosis not present

## 2023-04-11 DIAGNOSIS — R918 Other nonspecific abnormal finding of lung field: Secondary | ICD-10-CM | POA: Diagnosis not present

## 2023-04-11 DIAGNOSIS — R627 Adult failure to thrive: Secondary | ICD-10-CM

## 2023-04-11 DIAGNOSIS — I10 Essential (primary) hypertension: Secondary | ICD-10-CM | POA: Diagnosis not present

## 2023-04-11 LAB — COMPREHENSIVE METABOLIC PANEL
ALT: 12 U/L (ref 0–44)
AST: 22 U/L (ref 15–41)
Albumin: 2.5 g/dL — ABNORMAL LOW (ref 3.5–5.0)
Alkaline Phosphatase: 43 U/L (ref 38–126)
Anion gap: 10 (ref 5–15)
BUN: 27 mg/dL — ABNORMAL HIGH (ref 8–23)
CO2: 22 mmol/L (ref 22–32)
Calcium: 9.7 mg/dL (ref 8.9–10.3)
Chloride: 107 mmol/L (ref 98–111)
Creatinine, Ser: 1.41 mg/dL — ABNORMAL HIGH (ref 0.61–1.24)
GFR, Estimated: 46 mL/min — ABNORMAL LOW (ref >60)
Glucose, Bld: 90 mg/dL (ref 70–99)
Potassium: 4.6 mmol/L (ref 3.5–5.1)
Sodium: 139 mmol/L (ref 135–145)
Total Bilirubin: 1.9 mg/dL — ABNORMAL HIGH (ref <1.2)
Total Protein: 6.6 g/dL (ref 6.5–8.1)

## 2023-04-11 LAB — CBC WITH DIFFERENTIAL/PLATELET
Abs Immature Granulocytes: 0.04 10*3/uL (ref 0.00–0.07)
Basophils Absolute: 0 10*3/uL (ref 0.0–0.1)
Basophils Relative: 0 %
Eosinophils Absolute: 0.1 10*3/uL (ref 0.0–0.5)
Eosinophils Relative: 1 %
HCT: 33.9 % — ABNORMAL LOW (ref 39.0–52.0)
Hemoglobin: 11.2 g/dL — ABNORMAL LOW (ref 13.0–17.0)
Immature Granulocytes: 1 %
Lymphocytes Relative: 11 %
Lymphs Abs: 1 10*3/uL (ref 0.7–4.0)
MCH: 36.7 pg — ABNORMAL HIGH (ref 26.0–34.0)
MCHC: 33 g/dL (ref 30.0–36.0)
MCV: 111.1 fL — ABNORMAL HIGH (ref 80.0–100.0)
Monocytes Absolute: 0.9 10*3/uL (ref 0.1–1.0)
Monocytes Relative: 10 %
Neutro Abs: 6.7 10*3/uL (ref 1.7–7.7)
Neutrophils Relative %: 77 %
Platelets: 142 10*3/uL — ABNORMAL LOW (ref 150–400)
RBC: 3.05 MIL/uL — ABNORMAL LOW (ref 4.22–5.81)
RDW: 14.8 % (ref 11.5–15.5)
WBC: 8.6 10*3/uL (ref 4.0–10.5)
nRBC: 0 % (ref 0.0–0.2)

## 2023-04-11 LAB — TROPONIN I (HIGH SENSITIVITY)
Troponin I (High Sensitivity): 155 ng/L (ref <18)
Troponin I (High Sensitivity): 125 ng/L (ref <18)

## 2023-04-11 LAB — URINALYSIS, W/ REFLEX TO CULTURE (INFECTION SUSPECTED)
Bilirubin Urine: NEGATIVE
Glucose, UA: NEGATIVE mg/dL
Hgb urine dipstick: NEGATIVE
Ketones, ur: NEGATIVE mg/dL
Leukocytes,Ua: NEGATIVE
Nitrite: NEGATIVE
Protein, ur: NEGATIVE mg/dL
Specific Gravity, Urine: 1.014 (ref 1.005–1.030)
pH: 5 (ref 5.0–8.0)

## 2023-04-11 LAB — BRAIN NATRIURETIC PEPTIDE: B Natriuretic Peptide: 1023 pg/mL — ABNORMAL HIGH (ref 0.0–100.0)

## 2023-04-11 MED ORDER — LORAZEPAM 1 MG PO TABS: 0.5000 mg | ORAL_TABLET | Freq: Two times a day (BID) | ORAL | Status: DC | PRN

## 2023-04-11 MED ORDER — BOOST / RESOURCE BREEZE PO LIQD CUSTOM
1.0000 | Freq: Once | ORAL | Status: AC
  Administered 2023-04-11: 1 via ORAL
  Filled 2023-04-11: qty 1

## 2023-04-11 MED ORDER — TORSEMIDE 20 MG PO TABS: ORAL_TABLET | ORAL | Status: DC

## 2023-04-11 MED ORDER — SODIUM CHLORIDE 0.9 % IV SOLN: INTRAVENOUS | Status: AC

## 2023-04-11 MED ORDER — FUROSEMIDE 10 MG/ML IJ SOLN
40.0000 mg | Freq: Once | INTRAMUSCULAR | Status: AC
  Administered 2023-04-11: 40 mg via INTRAVENOUS
  Filled 2023-04-11: qty 4

## 2023-04-11 NOTE — ED Notes (Signed)
Date and time results received: 04/11/23 8:11 AM  (use smartphrase ".now" to insert current time)  Test: trop Critical Value: 155  Name of Provider Notified: Dr. Criss Alvine  Orders Received? Or Actions Taken?:

## 2023-04-11 NOTE — ED Triage Notes (Signed)
Patient from home for altered mental status that started 3-4 days ago. Patient was seen by his hospice nurse and diagnosed with UTI, abx started yesterday. Patient's son reports patient is now having some altered mental status and hallucinations, stating that patient is seeing bugs crawling everywhere and believes he his talking to his grandson who is not present at that time. Son reports he noticed the change in urine beginning last week and it has progressively gotten more cloudy and orange; reports the altered mental status started yesterday and is progressing. At baseline, patient has some confusion per son. Upon arrival to ER, patient is alert and oriented

## 2023-04-11 NOTE — Consult Note (Signed)
Initial Consultation Note                                                                                                                                                                              Patient Demographics:    Brett Blankenship, is a 89 y.o. male  MRN: [id number]   DOB - [birth year]  Admit Date - 04/11/2023  Outpatient Primary MD for the patient is Mechele Claude, MD   Assessment & Plan:   Assessment and Plan:  1)FTT/Dehydration--- patient and family reports poor oral intake despite Megace -Patient at times refusing to eat and drink -Currently under hospice care -IV fluids administered -Hold diuretics until oral intake improves  2) acute metabolic encephalopathy--- resolved -This is most likely due to recently initiated morphine sulfate by hospice team -Patient received about 4 doses of morphine sulfate on 04/08/23 to 04/10/2023--- subsequently became confused -Patient's son is stopped giving him morphine sulfate over the last 24 hours and confusion appears to have resolved -CT head unremarkable -UA not suggestive of UTI -No fevers -Chest x-ray without pneumonia  3) chronic diastolic dysfunction CHF/severe aortic regurgitation-- Echo from 05/28/2022 with EF of 60 to 65%, severe aortic regurgitation and mild aortic stenosis -Patient's troponins and BNP are not far from prior -EKG with lots of PVCs -Had chest pains couple days ago no further chest pains today --Oral intake has been poor lately..... BUN/creatinine trending up (creatinine today is 1.41 from 1.29 on 04/01/2023, BUN 27 today was 20 on 04/01/2023 -- Encourage adequate oral intake -Hold Lasix for now -IV fluids as above #1  4) CKD stage -3A   -Please see #3 above regarding Lasix and IV fluids and BUN/creatinine uptrend - Renally adjust medications, avoid nephrotoxic agents / dehydration  / hypotension  5) social/ethics--patient currently under hospice care due to advanced age and comorbid conditions --  Patient did not tolerate morphine sulfate well having hallucinations and confusion -He previously tolerated tramadol May use tramadol with Tylenol for pain control -Low-dose lorazepam as needed --- Patient and son would like to continue hospice care at home  6) polyarthritis/OA--- especially of the knee -Avoid morphine sulfate as outlined above #5--due to morphine induced psychosis -Okay to continue Relafen and tramadol with Tylenol as above #5  7) atrial fibrillation--- not on anticoagulation due to high bleeding risk and falls -Currently not on rate control agent  -Discussed with EDP and patient's son at bedside okay to discharge home with hospice services  Disposition/--- Home with hospice services Discharge Instructions:- 1)Torsemide/Demadex diuretic medications adjusted as outlined in discharge medication list 2) stop morphine sulfate due to confusion/hallucinations 3) okay to use tramadol along with acetaminophen/Tylenol as needed for pain 4) lorazepam as needed  for anxiety and sleep 5) adequate oral intake including nutritional supplements advised to prevent dehydration  Allergies as of 04/11/2023   No Known Allergies      Medication List     STOP taking these medications    ciprofloxacin 250 MG tablet Commonly known as: CIPRO   fish oil-omega-3 fatty acids 1000 MG capsule   morphine CONCENTRATE 10 mg / 0.5 ml concentrated solution       TAKE these medications    acetaminophen 500 MG tablet Commonly known as: TYLENOL Take 2 tablets (1,000 mg total) by mouth 3 (three) times daily. For pain   B-12 PO Take 1 tablet by mouth daily.   EYE VITAMINS & MINERALS PO Take 1 tablet by mouth daily.   LORazepam 1 MG tablet Commonly known as: ATIVAN Take 0.5 tablets (0.5 mg total) by mouth every 12 (twelve) hours as needed for anxiety or sleep. What changed:  how much to take when to take this   MULTIVITAMIN PO Take 1 tablet by mouth daily.   nabumetone 500 MG  tablet Commonly known as: RELAFEN Take 1 tablet (500 mg total) by mouth 2 (two) times daily as needed. TAKE 2 TABLETS BY MOUTH TWICE DAILY AS NEEDED FOR PAIN. Strength: 500 mg What changed:  how much to take when to take this additional instructions   nitroGLYCERIN 0.4 MG SL tablet Commonly known as: NITROSTAT Place 1 tablet (0.4 mg total) under the tongue every 5 (five) minutes x 3 doses as needed for chest pain (if no relief after 3rd dose, proceed to ED or call 911).   ondansetron 4 MG tablet Commonly known as: ZOFRAN Take 4 mg by mouth every 4 (four) hours as needed for nausea or vomiting.   potassium chloride SA 20 MEQ tablet Commonly known as: KLOR-CON M Take 2 tablets (40 mEq total) by mouth every morning AND 1 tablet (20 mEq total) every evening.   torsemide 20 MG tablet Commonly known as: DEMADEX Take 1 tablet (20 mg total) by mouth daily. May also take 1 tablet (20 mg total) daily as needed (For weight gain/ swelling). Start taking on: April 14, 2023 What changed:  See the new instructions. These instructions start on April 14, 2023. If you are unsure what to do until then, ask your doctor or other care provider.   traMADol 50 MG tablet Commonly known as: ULTRAM Take 1 tablet (50 mg total) by mouth 4 (four) times daily as needed for moderate pain (pain score 4-6).   Vitamin D (Ergocalciferol) 1.25 MG (50000 UNIT) Caps capsule Commonly known as: DRISDOL TAKE 1 CAPSULE BY MOUTH ONCE EVERY 7 DAYS.        Dispo: The patient is from: Home              Anticipated d/c is to: Home with Hospice  With History of - Reviewed by me  Past Medical History:  Diagnosis Date   Aortic regurgitation    Atrial fibrillation (HCC)    C7 cervical fracture (HCC)    CHF (congestive heart failure) (HCC)    HYPERTENSION 07/12/2009      Past Surgical History:  Procedure Laterality Date   APPENDECTOMY  1960   NECK SURGERY        Chief Complaint  Patient presents  with   Altered Mental Status      HPI:    Brett Blankenship  is a 89 y.o. male with past medical history relevant for severe aortic stenosis atrial fibrillation HTN  CKD 3, diastolic dysfunction CHF, severe aortic regurgitation currently on hospice care at home  Additional history obtained from son Marcial Pacas at bedside -Patient received about 4 doses of morphine sulfate on 04/08/23 to 04/10/2023--- subsequently became confused -Patient's son is stopped giving him morphine sulfate over the last 24 hours and confusion appears to have resolved -CT head unremarkable -UA not suggestive of UTI -No fevers -Chest x-ray with pleural effusions, without pneumonia  -As per patient oral intake has been poor despite Megace use -Concerns about dehydration No fever  Or chills  -Had chest pains couple days ago no further chest pains today --Patient's troponins and BNP are not far from prior -EKG with lots of PVCs     Review of systems:    In addition to the HPI above,   A full Review of  Systems was done, all other systems reviewed are negative except as noted above in HPI , .    Social History:  Reviewed by me    Social History   Tobacco Use   Smoking status: Former    Current packs/day: 0.00    Average packs/day: 0.3 packs/day for 5.0 years (1.5 ttl pk-yrs)    Types: Cigarettes    Start date: 09/12/1958    Quit date: 09/12/1963    Years since quitting: 59.6   Smokeless tobacco: Never  Substance Use Topics   Alcohol use: Not Currently       Family History :  Reviewed by me    Family History  Problem Relation Age of Onset   Hypertension Father    Heart disease Father    Cancer Brother        colon, lung     Home Medications:   Prior to Admission medications   Medication Sig Start Date End Date Taking? Authorizing Provider  acetaminophen (TYLENOL) 500 MG tablet Take 2 tablets (1,000 mg total) by mouth 3 (three) times daily. For pain 12/25/22  Yes Stacks, Broadus [name], MD   Cyanocobalamin (B-12 PO) Take 1 tablet by mouth daily.   Yes [provider]  Multiple Vitamin (MULTIVITAMIN PO) Take 1 tablet by mouth daily.   Yes [provider]  Multiple Vitamins-Minerals (EYE VITAMINS & MINERALS PO) Take 1 tablet by mouth daily.   Yes [provider]  nabumetone (RELAFEN) 500 MG tablet Take 1 tablet (500 mg total) by mouth 2 (two) times daily as needed. TAKE 2 TABLETS BY MOUTH TWICE DAILY AS NEEDED FOR PAIN. Strength: 500 mg Patient taking differently: Take 1,000 mg by mouth 2 (two) times daily. 03/25/23  Yes Mallipeddi, Vishnu P, MD  nitroGLYCERIN (NITROSTAT) 0.4 MG SL tablet Place 1 tablet (0.4 mg total) under the tongue every 5 (five) minutes x 3 doses as needed for chest pain (if no relief after 3rd dose, proceed to ED or call 911). 03/25/23  Yes Mallipeddi, Vishnu P, MD  ondansetron (ZOFRAN) 4 MG tablet Take 4 mg by mouth every 4 (four) hours as needed for nausea or vomiting. 04/07/23  Yes [provider]  potassium chloride SA (KLOR-CON M) 20 MEQ tablet Take 2 tablets (40 mEq total) by mouth every morning AND 1 tablet (20 mEq total) every evening. 03/25/23  Yes Mallipeddi, Vishnu P, MD  traMADol (ULTRAM) 50 MG tablet Take 1 tablet (50 mg total) by mouth 4 (four) times daily as needed for moderate pain (pain score 4-6). 04/01/23  Yes Mechele Claude, MD  Vitamin D, Ergocalciferol, (DRISDOL) 1.25 MG (50000 UNIT) CAPS capsule TAKE 1 CAPSULE BY  MOUTH ONCE EVERY 7 DAYS. 06/11/22  Yes Stacks, Broadus [name], MD  LORazepam (ATIVAN) 1 MG tablet Take 0.5 tablets (0.5 mg total) by mouth every 12 (twelve) hours as needed for anxiety or sleep. 04/11/23   Shon Hale, MD  torsemide (DEMADEX) 20 MG tablet Take 1 tablet (20 mg total) by mouth daily. May also take 1 tablet (20 mg total) daily as needed (For weight gain/ swelling). 04/14/23   Shon Hale, MD     Allergies:    No Known Allergies   Physical Exam:   Vitals  Blood pressure 128/79,  pulse 95, temperature 97.6 F (36.4 C), temperature source Rectal, resp. rate 17, height 5\' 9"  (1.753 m), weight 78 kg, SpO2 95%.  Physical Examination: General appearance - alert,  in no distress  Mental status - alert, oriented to person, place, and time,  Eyes - sclera anicteric Neck - supple, no JVD elevation , Chest - clear  to auscultation bilaterally, symmetrical air movement,  Heart - S1 and S2 normal, irregular , 4/6 Murmur Abdomen - soft, nontender, nondistended, +BS Neurological - screening mental status exam normal, neck supple without rigidity, cranial nerves II through XII intact, DTR's normal and symmetric Extremities - no significant pedal edema noted, intact peripheral pulses  Skin - warm, dry     Data Review:    CBC Recent Labs  Lab 04/11/23 0703  WBC 8.6  HGB 11.2*  HCT 33.9*  PLT 142*  MCV 111.1*  MCH 36.7*  MCHC 33.0  RDW 14.8  LYMPHSABS 1.0  MONOABS 0.9  EOSABS 0.1  BASOSABS 0.0   ------------------------------------------------------------------------------------------------------------------  Chemistries  Recent Labs  Lab 04/11/23 0703  NA 139  K 4.6  CL 107  CO2 22  GLUCOSE 90  BUN 27*  CREATININE 1.41*  CALCIUM 9.7  AST 22  ALT 12  ALKPHOS 43  BILITOT 1.9*   ------------------------------------------------------------------------------------------------------------------ estimated creatinine clearance is 32 mL/min (A) (by C-G formula based on SCr of 1.41 mg/dL (H)). ------------------------------------------------------------------------------------------------------------------   ------------------------------------------------------------------------------------------------------------------    Component Value Date/Time   BNP 1,023.0 (H) 04/11/2023 0703     Urinalysis    Component Value Date/Time   COLORURINE YELLOW 04/11/2023 0945   APPEARANCEUR CLEAR 04/11/2023 0945   LABSPEC 1.014 04/11/2023 0945   PHURINE 5.0  04/11/2023 0945   GLUCOSEU NEGATIVE 04/11/2023 0945   HGBUR NEGATIVE 04/11/2023 0945   BILIRUBINUR NEGATIVE 04/11/2023 0945   KETONESUR NEGATIVE 04/11/2023 0945   PROTEINUR NEGATIVE 04/11/2023 0945   NITRITE NEGATIVE 04/11/2023 0945   LEUKOCYTESUR NEGATIVE 04/11/2023 0945   -   Imaging Results:    CT Head Wo Contrast Result Date: 04/11/2023 CLINICAL DATA:  Provided history: Mental status change, unknown cause. EXAM: CT HEAD WITHOUT CONTRAST TECHNIQUE: Contiguous axial images were obtained from the base of the skull through the vertex without intravenous contrast. RADIATION DOSE REDUCTION: This exam was performed according to the departmental dose-optimization program which includes automated exposure control, adjustment of the mA and/or kV according to patient size and/or use of iterative reconstruction technique. COMPARISON:  Head CT 12/08/2022. FINDINGS: Brain: Generalized cerebral atrophy. Patchy and ill-defined hypoattenuation within the cerebral white matter, nonspecific but compatible with moderate chronic small vessel ischemic disease. There is no acute intracranial hemorrhage. No demarcated cortical infarct. No extra-axial fluid collection. No evidence of an intracranial mass. No midline shift. Vascular: No hyperdense vessel.  Atherosclerotic calcifications. Skull: No calvarial fracture or aggressive osseous lesion. Sinuses/Orbits: No mass or acute finding within the imaged orbits. No significant paranasal sinus  disease at the imaged levels. IMPRESSION: 1. No evidence of an acute intracranial abnormality. 2. Parenchymal atrophy and chronic small vessel ischemic disease. Electronically Signed   By: Jackey Loge D.O.   On: 04/11/2023 08:23   DG Chest Portable 1 View Result Date: 04/11/2023 CLINICAL DATA:  88 year old male with altered mental status. EXAM: PORTABLE CHEST 1 VIEW COMPARISON:  Chest radiographs 03/21/2023 and earlier. FINDINGS: Portable AP upright view at 0712 hours today. Stable  lung volumes since November. Mild blunting of the right costophrenic angle persists, and there is a new pointed opacity along the inferior right hilum. Last month pleural fluid was tracking into the major fissures. But elsewhere the lungs are clear when allowing for portable technique. No pneumothorax or edema. Stable other mediastinal contours. Tortuous descending thoracic aorta with calcified atherosclerosis. Visualized tracheal air column is within normal limits. Negative visible bowel gas. No acute osseous abnormality identified. IMPRESSION: 1. Evidence of ongoing layering right pleural effusion and probably increased fluid tracking into the right minor fissure since last month, now resulting in new angled opacity along the inferior right hilum. 2. No other acute cardiopulmonary abnormality. Aortic Atherosclerosis (ICD10-I70.0). Electronically Signed   By: Odessa Fleming M.D.   On: 04/11/2023 07:23    Radiological Exams on Admission: CT Head Wo Contrast Result Date: 04/11/2023 CLINICAL DATA:  Provided history: Mental status change, unknown cause. EXAM: CT HEAD WITHOUT CONTRAST TECHNIQUE: Contiguous axial images were obtained from the base of the skull through the vertex without intravenous contrast. RADIATION DOSE REDUCTION: This exam was performed according to the departmental dose-optimization program which includes automated exposure control, adjustment of the mA and/or kV according to patient size and/or use of iterative reconstruction technique. COMPARISON:  Head CT 12/08/2022. FINDINGS: Brain: Generalized cerebral atrophy. Patchy and ill-defined hypoattenuation within the cerebral white matter, nonspecific but compatible with moderate chronic small vessel ischemic disease. There is no acute intracranial hemorrhage. No demarcated cortical infarct. No extra-axial fluid collection. No evidence of an intracranial mass. No midline shift. Vascular: No hyperdense vessel.  Atherosclerotic calcifications. Skull: No  calvarial fracture or aggressive osseous lesion. Sinuses/Orbits: No mass or acute finding within the imaged orbits. No significant paranasal sinus disease at the imaged levels. IMPRESSION: 1. No evidence of an acute intracranial abnormality. 2. Parenchymal atrophy and chronic small vessel ischemic disease. Electronically Signed   By: Jackey Loge D.O.   On: 04/11/2023 08:23   DG Chest Portable 1 View Result Date: 04/11/2023 CLINICAL DATA:  89 year old male with altered mental status. EXAM: PORTABLE CHEST 1 VIEW COMPARISON:  Chest radiographs 03/21/2023 and earlier. FINDINGS: Portable AP upright view at 0712 hours today. Stable lung volumes since November. Mild blunting of the right costophrenic angle persists, and there is a new pointed opacity along the inferior right hilum. Last month pleural fluid was tracking into the major fissures. But elsewhere the lungs are clear when allowing for portable technique. No pneumothorax or edema. Stable other mediastinal contours. Tortuous descending thoracic aorta with calcified atherosclerosis. Visualized tracheal air column is within normal limits. Negative visible bowel gas. No acute osseous abnormality identified. IMPRESSION: 1. Evidence of ongoing layering right pleural effusion and probably increased fluid tracking into the right minor fissure since last month, now resulting in new angled opacity along the inferior right hilum. 2. No other acute cardiopulmonary abnormality. Aortic Atherosclerosis (ICD10-I70.0). Electronically Signed   By: Odessa Fleming M.D.   On: 04/11/2023 07:23    Condition   -stable, overall prognosis is poor given comorbid conditions  Shon Hale M.D on 04/11/2023 at 1:56 PM Go to [email or web address] -  for contact info  [facility] - Office  [phone]

## 2023-04-11 NOTE — Discharge Instructions (Addendum)
1)Torsemide/Demadex diuretic medications adjusted as outlined in discharge medication list 2) stop morphine sulfate due to confusion/hallucinations 3) okay to use tramadol along with acetaminophen/Tylenol as needed for pain 4) lorazepam as needed for anxiety and sleep 5) adequate oral intake including nutritional supplements advised to prevent dehydration

## 2023-04-11 NOTE — ED Provider Notes (Signed)
[facility] AT [facility] Provider Note   CSN: [id number] Arrival date & time: 04/11/23  0630     History  Chief Complaint  Patient presents with   Altered Mental Status    Brett Blankenship is a 89 y.o. male.  HPI 89 year old male presents from home for evaluation of altered mental status.  History is primarily from the son.  Patient has some mild confusion at baseline.  He has a hospice nurse more firm convenience than being an actual hospice according to what the son tells me.  The patient got out of bed around 4:30 AM yesterday and fell.  Seems to be hallucinating since yesterday.  Was seeing bugs.  The hospice nurse came out yesterday and found out the patient has been having dark urine color for the last couple days so she prescribed Cipro.  However no urinalysis was obtained.  Patient crawled out of bed again today and seem to be talking to his grandson who was not in the room.  Has acted similarly with prior UTIs.  Son is wanting to see what is actually going on rather than presumptive treating.  Son also tells me about an episode of chest pain that required 3 nitroglycerin and eventually tramadol about 5 days ago.  He was never evaluated besides with the hospice nurse.  He denies any current chest pain.   Home Medications Prior to Admission medications   Medication Sig Start Date End Date Taking? Authorizing Provider  acetaminophen (TYLENOL) 500 MG tablet Take 2 tablets (1,000 mg total) by mouth 3 (three) times daily. For pain 12/25/22  Yes Stacks, Broadus [name], MD  Cyanocobalamin (B-12 PO) Take 1 tablet by mouth daily.   Yes [provider]  Multiple Vitamin (MULTIVITAMIN PO) Take 1 tablet by mouth daily.   Yes [provider]  Multiple Vitamins-Minerals (EYE VITAMINS & MINERALS PO) Take 1 tablet by mouth daily.   Yes [provider]  nabumetone (RELAFEN) 500 MG tablet Take 1 tablet (500 mg total) by mouth 2 (two) times daily  as needed. TAKE 2 TABLETS BY MOUTH TWICE DAILY AS NEEDED FOR PAIN. Strength: 500 mg Patient taking differently: Take 1,000 mg by mouth 2 (two) times daily. 03/25/23  Yes Mallipeddi, Vishnu P, MD  nitroGLYCERIN (NITROSTAT) 0.4 MG SL tablet Place 1 tablet (0.4 mg total) under the tongue every 5 (five) minutes x 3 doses as needed for chest pain (if no relief after 3rd dose, proceed to ED or call 911). 03/25/23  Yes Mallipeddi, Vishnu P, MD  ondansetron (ZOFRAN) 4 MG tablet Take 4 mg by mouth every 4 (four) hours as needed for nausea or vomiting. 04/07/23  Yes [provider]  potassium chloride SA (KLOR-CON M) 20 MEQ tablet Take 2 tablets (40 mEq total) by mouth every morning AND 1 tablet (20 mEq total) every evening. 03/25/23  Yes Mallipeddi, Vishnu P, MD  traMADol (ULTRAM) 50 MG tablet Take 1 tablet (50 mg total) by mouth 4 (four) times daily as needed for moderate pain (pain score 4-6). 04/01/23  Yes Mechele Claude, MD  Vitamin D, Ergocalciferol, (DRISDOL) 1.25 MG (50000 UNIT) CAPS capsule TAKE 1 CAPSULE BY MOUTH ONCE EVERY 7 DAYS. 06/11/22  Yes Stacks, Broadus [name], MD  LORazepam (ATIVAN) 1 MG tablet Take 0.5 tablets (0.5 mg total) by mouth every 12 (twelve) hours as needed for anxiety or sleep. 04/11/23   Shon Hale, MD  torsemide (DEMADEX) 20 MG tablet Take 1 tablet (20 mg total) by mouth  daily. May also take 1 tablet (20 mg total) daily as needed (For weight gain/ swelling). 04/14/23   Shon Hale, MD      Allergies    Patient has no known allergies.    Review of Systems   Review of Systems  Unable to perform ROS: Mental status change    Physical Exam Updated Vital Signs BP (!) 156/78   Pulse 99   Temp 97.6 F (36.4 C) (Rectal)   Resp 17   Ht 5\' 9"  (1.753 m)   Wt 78 kg   SpO2 95%   BMI 25.40 kg/m  Physical Exam Vitals and nursing note reviewed.  Constitutional:      Appearance: He is well-developed.     Comments: Patient is resting comfortably. Does not seem to be  actively or obviously hallucinating  HENT:     Head: Normocephalic and atraumatic.  Eyes:     Pupils: Pupils are equal, round, and reactive to light.  Cardiovascular:     Rate and Rhythm: Normal rate. Rhythm irregular.     Heart sounds: Murmur heard.  Pulmonary:     Effort: Pulmonary effort is normal.     Breath sounds: Normal breath sounds.  Abdominal:     General: There is no distension.     Palpations: Abdomen is soft.     Tenderness: There is no abdominal tenderness.  Musculoskeletal:     Comments: Bilateral ankle edema  Skin:    General: Skin is warm and dry.  Neurological:     Mental Status: He is alert.     Comments: Patient has equal strength in his upper extremities and while his lower extremities are weaker than the upper, they are symmetric.  Patient walks with a walker at baseline and sometimes a wheelchair.     ED Results / Procedures / Treatments   Labs (all labs ordered are listed, but only abnormal results are displayed) Labs Reviewed  CBC WITH DIFFERENTIAL/PLATELET - Abnormal; Notable for the following components:      Result Value   RBC 3.05 (*)    Hemoglobin 11.2 (*)    HCT 33.9 (*)    MCV 111.1 (*)    MCH 36.7 (*)    Platelets 142 (*)    All other components within normal limits  COMPREHENSIVE METABOLIC PANEL - Abnormal; Notable for the following components:   BUN 27 (*)    Creatinine, Ser 1.41 (*)    Albumin 2.5 (*)    Total Bilirubin 1.9 (*)    GFR, Estimated 46 (*)    All other components within normal limits  URINALYSIS, W/ REFLEX TO CULTURE (INFECTION SUSPECTED) - Abnormal; Notable for the following components:   Bacteria, UA RARE (*)    All other components within normal limits  BRAIN NATRIURETIC PEPTIDE - Abnormal; Notable for the following components:   B Natriuretic Peptide 1,023.0 (*)    All other components within normal limits  TROPONIN I (HIGH SENSITIVITY) - Abnormal; Notable for the following components:   Troponin I (High  Sensitivity) 155 (*)    All other components within normal limits  TROPONIN I (HIGH SENSITIVITY) - Abnormal; Notable for the following components:   Troponin I (High Sensitivity) 125 (*)    All other components within normal limits  CULTURE, BLOOD (ROUTINE X 2)  CULTURE, BLOOD (ROUTINE X 2)    EKG EKG Interpretation Date/Time:  Friday April 11 2023 08:05:44 EST Ventricular Rate:  89 PR Interval:    QRS Duration:  120 QT Interval:  395 QTC Calculation: 481 R Axis:   -59  Text Interpretation: Atrial fibrillation Ventricular premature complex Incomplete left bundle branch block LVH with secondary repolarization abnormality Anterior Q waves, possibly due to LVH  similar to nov 2024 Confirmed by Pricilla Loveless [phone]) on 04/11/2023 8:11:31 AM  Radiology CT Head Wo Contrast Result Date: 04/11/2023 CLINICAL DATA:  Provided history: Mental status change, unknown cause. EXAM: CT HEAD WITHOUT CONTRAST TECHNIQUE: Contiguous axial images were obtained from the base of the skull through the vertex without intravenous contrast. RADIATION DOSE REDUCTION: This exam was performed according to the departmental dose-optimization program which includes automated exposure control, adjustment of the mA and/or kV according to patient size and/or use of iterative reconstruction technique. COMPARISON:  Head CT 12/08/2022. FINDINGS: Brain: Generalized cerebral atrophy. Patchy and ill-defined hypoattenuation within the cerebral white matter, nonspecific but compatible with moderate chronic small vessel ischemic disease. There is no acute intracranial hemorrhage. No demarcated cortical infarct. No extra-axial fluid collection. No evidence of an intracranial mass. No midline shift. Vascular: No hyperdense vessel.  Atherosclerotic calcifications. Skull: No calvarial fracture or aggressive osseous lesion. Sinuses/Orbits: No mass or acute finding within the imaged orbits. No significant paranasal sinus disease at the  imaged levels. IMPRESSION: 1. No evidence of an acute intracranial abnormality. 2. Parenchymal atrophy and chronic small vessel ischemic disease. Electronically Signed   By: Jackey Loge D.O.   On: 04/11/2023 08:23   DG Chest Portable 1 View Result Date: 04/11/2023 CLINICAL DATA:  89 year old male with altered mental status. EXAM: PORTABLE CHEST 1 VIEW COMPARISON:  Chest radiographs 03/21/2023 and earlier. FINDINGS: Portable AP upright view at 0712 hours today. Stable lung volumes since November. Mild blunting of the right costophrenic angle persists, and there is a new pointed opacity along the inferior right hilum. Last month pleural fluid was tracking into the major fissures. But elsewhere the lungs are clear when allowing for portable technique. No pneumothorax or edema. Stable other mediastinal contours. Tortuous descending thoracic aorta with calcified atherosclerosis. Visualized tracheal air column is within normal limits. Negative visible bowel gas. No acute osseous abnormality identified. IMPRESSION: 1. Evidence of ongoing layering right pleural effusion and probably increased fluid tracking into the right minor fissure since last month, now resulting in new angled opacity along the inferior right hilum. 2. No other acute cardiopulmonary abnormality. Aortic Atherosclerosis (ICD10-I70.0). Electronically Signed   By: Odessa Fleming M.D.   On: 04/11/2023 07:23    Procedures Procedures    Medications Ordered in ED Medications  0.9 %  sodium chloride infusion (0 mLs Intravenous Stopped 04/11/23 1358)  furosemide (LASIX) injection 40 mg (40 mg Intravenous Given 04/11/23 0941)  feeding supplement (BOOST / RESOURCE BREEZE) liquid 1 Container (1 Container Oral Given 04/11/23 1357)    ED Course/ Medical Decision Making/ A&P                                 Medical Decision Making Amount and/or Complexity of Data Reviewed Independent Historian:     Details: Son External Data Reviewed: notes. Labs:  ordered.    Details: Elevated troponin, elevated BNP Radiology: ordered and independent interpretation performed.    Details: Right pleural effusion No head bleed ECG/medicine tests: ordered and independent interpretation performed.    Details: Afib  Risk Prescription drug management. Decision regarding hospitalization.   Patient is in no acute distress.  Workup shows a mild bump in his  creatinine but also concern for some CHF.  He has elevated troponins but has had similar values in the past and are downtrending.  I discussed with Dr. Mariea Clonts given consolation of symptoms.  He has seen patient and feels the patient actually needs some fluids given poor p.o. intake and darker urine and has given him some.  However he is also talk to the son and will discharge the patient.  Discharged by the hospitalist.  Of note, he is had a chronic and unchanged cough.  No recent change in this or URI symptoms so I do not think the x-ray represents pneumonia.        Final Clinical Impression(s) / ED Diagnoses Final diagnoses:  Hallucinations    Rx / DC Orders ED Discharge Orders          Ordered    torsemide (DEMADEX) 20 MG tablet  Multiple Frequencies        04/11/23 1355    LORazepam (ATIVAN) 1 MG tablet  Every 12 hours PRN        04/11/23 1355    Increase activity slowly        04/11/23 1355    Discharge instructions       Comments: 1)Torsemide/Demadex diuretic medications adjusted as outlined in discharge medication list 2) stop morphine sulfate due to confusion/hallucinations 3) okay to use tramadol along with acetaminophen/Tylenol as needed for pain 4) lorazepam as needed for anxiety and sleep 5) adequate oral intake including nutritional supplements advised to prevent dehydration   04/11/23 1355    Diet general        04/11/23 1355    Call MD for:  temperature >100.4        04/11/23 1355    Call MD for:  persistant nausea and vomiting        04/11/23 1355    Call MD for:   severe uncontrolled pain        04/11/23 1355    Call MD for:  difficulty breathing, headache or visual disturbances        04/11/23 1355    Call MD for:  persistant dizziness or light-headedness        04/11/23 1355              Pricilla Loveless, MD 04/11/23 1452

## 2023-04-15 ENCOUNTER — Telehealth: Payer: Self-pay

## 2023-04-15 ENCOUNTER — Ambulatory Visit: Payer: Medicare Other | Admitting: Internal Medicine

## 2023-04-15 NOTE — Telephone Encounter (Signed)
Copied from CRM [id number]. Topic: General - Other >> Apr 15, 2023  2:18 PM Tiffany H wrote: Caller/Agency: Samrudh Borgo Callback Number: [phone] Service Requested: [facility] [address] [place], [place] [address] Phone: [phone]  Fax: [phone] Any new concerns about the patient? Yes. Patient is incontinent and cold to the touch. Hospice RN has called several times for various falls and mishaps. Hospice RN advised that patient is declining rapidly. Advised that patient has end of life restlessness.  Reason for CRM: Patient's son Jorja Loa called to advice that patient has been transitioned into hospice. Tim advised office visit notes, medication list and FL2 form needs to be written by PCP so that patient can be admitted to nursing facility for hospice. Please follow up with son Jorja Loa to provide additional suggestions.

## 2023-04-15 NOTE — Telephone Encounter (Signed)
I completed this last week. Can you help?

## 2023-04-16 ENCOUNTER — Ambulatory Visit: Payer: Self-pay | Admitting: Family Medicine

## 2023-04-16 ENCOUNTER — Ambulatory Visit: Payer: Medicare Other | Admitting: Family Medicine

## 2023-04-16 LAB — CULTURE, BLOOD (ROUTINE X 2)
Culture: NO GROWTH
Culture: NO GROWTH
Special Requests: ADEQUATE
Special Requests: ADEQUATE

## 2023-04-16 NOTE — Telephone Encounter (Signed)
Chief Complaint: fall Symptoms: fall Pertinent Negatives: Patient denies injury, pain, dizziness, new onset Disposition: [] ED /[] Urgent Care (no appt availability in office) / [] Appointment(In office/virtual)/ []  [facility]/ [] Home Care/ [] Refused Recommended Disposition /[] [facility]/ [x]  Follow-up with Hospice Nurse Additional Notes: Nurse Irving Burton from hospice called in stating patient had fall with no injury. This RN called back and spoke with son Marcial Pacas who is with patient to obtain further information. Son states he feels that patient is nearing end of life and that is why he contacted hospice nurse. Son states he is unsure of why hospice nurse called [place]. Son states patient experienced fall after tripping while getting out of bed and landed on his side on the couch, which is next to the bed. Son denies injury, new onset, and reports that patient did not hit his head or the ground.  Per protocol this RN did advise seeing hospice care nurse, to which son reports he has already spoken with them prior to speaking with [place].      Copied from CRM [id number]. Topic: General - Other >> Apr 16, 2023 10:23 AM Colletta Maryland S wrote: Reason for CRM: Nurse Irving Burton with hospice called in stating pt had another fall but with no injury Reason for Disposition  [1] Recent fall AND [2] no injury  [1] Fall AND [2] no apparent injury  Answer Assessment - Initial Assessment Questions 1. MECHANISM: "How did the fall happen?"     Fell onto couch while getting up 2. DOMESTIC VIOLENCE AND ELDER ABUSE SCREENING: "Did you fall because someone pushed you or tried to hurt you?" If Yes, ask: "Are you safe now?"     no 3. ONSET: "When did the fall happen?" (e.g., minutes, hours, or days ago)     This morning 4. LOCATION: "What part of the body hit the ground?" (e.g., back, buttocks, head, hips, knees, hands, head, stomach)     Laying on his side 5. INJURY: "Did you hurt (injure) yourself when you fell?"  If Yes, ask: "What did you injure? Tell me more about this?" (e.g., body area; type of injury; pain severity)"     no 6. PAIN: "Is there any pain?" If Yes, ask: "How bad is the pain?" (e.g., Scale 1-10; or mild,  moderate, severe)   - NONE (0): No pain   - MILD (1-3): Doesn't interfere with normal activities    - MODERATE (4-7): Interferes with normal activities or awakens from sleep    - SEVERE (8-10): Excruciating pain, unable to do any normal activities      no 7. SIZE: For cuts, bruises, or swelling, ask: "How large is it?" (e.g., inches or centimeters)      no 9. OTHER SYMPTOMS: "Do you have any other symptoms?" (e.g., dizziness, fever, weakness; new onset or worsening).      no 10. CAUSE: "What do you think caused the fall (or falling)?" (e.g., tripped, dizzy spell)       He falls often  Answer Assessment - Initial Assessment Questions 1. DESCRIPTION: "Tell me more about the fall."     Patient fell onto couch when getting up from bed 2. ONSET: "When did the fall occur?"     This morning 3. FREQUENCY: "How many times have you (your loved one; patient) fallen?"     He trips often 4. HISTORY:  "Have you had falls in the past?"     yes 5. CONSCIOUSNESS:  "Was there any loss of consciousness before or  after the fall?"     no 6. INJURY:  "Are there any injuries?" If Yes, ask: "Where is the injury?" "What does the injury look like?" (e.g., bruising, swelling, bleeding, deformity)     no 7. PAIN: "Is there any pain?" If Yes, ask: "How bad is the pain?" (e.g., Scale 1-10; or mild,  moderate, severe)   - NONE (0): No pain   - MILD (1-3): Doesn't interfere with normal activities    - MODERATE (4-7): Interferes with normal activities or awakens from sleep    - SEVERE (8-10): Excruciating pain, unable to do any normal activities      no 8. BLEEDING: "Is there any bleeding?" If Yes, ask "Is it bleeding now?" "Is the bleeding hard to stop?"      no 9. BLEEDING RISK: "Are you taking any  blood thinners?"  (e.g., warfarin, fondaparinux, apixaban,  dabigatran, rivaroxaban). "Do you (they) have a known bleeding disorder?"  (e.g., thrombocytopenia)     "I'm not sure" 10. OTHER SYMPTOMS: "Do you have any other symptoms?" (e.g., chest pain, dizziness, fever, trouble breathing, vomiting, diarrhea)       no 11. MEDICINES: "Did you start any new medicines recently?" (e.g., opioid pain medicine, benzodiazepine, blood pressure medicine).         no 12. CAUSE:  "What were you doing prior to the fall?" (e.g., getting out of bed, chair, walking, etc.). "What do you think caused the fall?" (e.g., loss of balance, a medicine, feeling dizzy or lightheaded)       Getting up from bed 13. ASSISTIVE DEVICES:  "Do you use any assistive device?" (e.g., walker, cane, gait belt)       no 14. CALLER's COPING: "How are you doing?" "How are other family members and loved ones doing?"       fine  Protocols used: [facility] [place] [place]

## 2023-04-18 ENCOUNTER — Telehealth: Payer: Self-pay | Admitting: Family Medicine

## 2023-04-18 ENCOUNTER — Telehealth: Payer: Medicare Other | Admitting: Nurse Practitioner

## 2023-04-18 NOTE — Telephone Encounter (Signed)
LMOVM to get clarification that pt was requesting [facility] care, there is an FL2 on PCP's desk for him to go to [facility].

## 2023-04-18 NOTE — Telephone Encounter (Unsigned)
Copied from CRM [id number]. Topic: Appointments - Transfer of Care >> Apr 18, 2023 11:56 AM Antony Haste wrote: Pt is requesting to transfer FROM: [facility] Pt is requesting to transfer TO: HOSPICE  Reason for requested transfer: End of life It is the responsibility of the team the patient would like to transfer to (Dr. ?) to reach out to the patient if for any reason this transfer is not acceptable. Hospice nurse Irving Burton wanted to send message to PT's PCP concerning him being transferred to the [facility] for end of life.  Callback [phone]

## 2023-04-24 NOTE — Telephone Encounter (Signed)
TC back to [facility] w/ Hospice Pt is at [facility] house, he has begun to decline rapidly and no longer needs the [facility]

## 2023-04-24 NOTE — Telephone Encounter (Signed)
FL2 is no longer needed, pt is at the [facility].

## 2023-04-30 DEATH — deceased

## 2023-05-02 ENCOUNTER — Telehealth: Payer: Self-pay | Admitting: Family Medicine

## 2023-05-02 NOTE — Telephone Encounter (Signed)
 Tried to call Mariel Kansky at [facility] and he is not in right now.  Spoke with Joyce Gross and advised her that Dr Darlyn Read is out of the office today and he will not be able to sign death certificate until 2023/08/13.

## 2023-05-02 NOTE — Telephone Encounter (Signed)
 Copied from CRM [id number]. Topic: General - Deceased Patient >> 05-11-23 11:57 AM Farrel B wrote: Name of caller: Brett Blankenship   Date of death: May 02, 2023   Name of funeral home: [facility]  Phone number of funeral home: [phone]  Provider that needs to sign form: Dr. Butler Der needs to sign the death certificate  The deceased patient's family is requesting the death certificate signed by provider please call Mr. Howie at [facility] to speak directly to him
